# Patient Record
Sex: Female | Born: 1935 | Race: Black or African American | Hispanic: No | Smoking: Never smoker
Health system: Southern US, Community
[De-identification: ages and names within clinical notes are randomized; demographics above are authoritative.]

## PROBLEM LIST (undated history)

## (undated) DIAGNOSIS — N644 Mastodynia: Secondary | ICD-10-CM

## (undated) DIAGNOSIS — E785 Hyperlipidemia, unspecified: Secondary | ICD-10-CM

## (undated) DIAGNOSIS — M8580 Other specified disorders of bone density and structure, unspecified site: Secondary | ICD-10-CM

## (undated) DIAGNOSIS — M545 Low back pain, unspecified: Secondary | ICD-10-CM

## (undated) DIAGNOSIS — I872 Venous insufficiency (chronic) (peripheral): Secondary | ICD-10-CM

## (undated) DIAGNOSIS — F419 Anxiety disorder, unspecified: Secondary | ICD-10-CM

## (undated) DIAGNOSIS — M1990 Unspecified osteoarthritis, unspecified site: Secondary | ICD-10-CM

## (undated) DIAGNOSIS — IMO0002 Reserved for concepts with insufficient information to code with codable children: Secondary | ICD-10-CM

## (undated) DIAGNOSIS — R001 Bradycardia, unspecified: Secondary | ICD-10-CM

## (undated) DIAGNOSIS — K589 Irritable bowel syndrome without diarrhea: Secondary | ICD-10-CM

## (undated) DIAGNOSIS — I340 Nonrheumatic mitral (valve) insufficiency: Secondary | ICD-10-CM

## (undated) DIAGNOSIS — C50919 Malignant neoplasm of unspecified site of unspecified female breast: Secondary | ICD-10-CM

## (undated) DIAGNOSIS — I10 Essential (primary) hypertension: Secondary | ICD-10-CM

## (undated) DIAGNOSIS — G5600 Carpal tunnel syndrome, unspecified upper limb: Secondary | ICD-10-CM

## (undated) DIAGNOSIS — I6529 Occlusion and stenosis of unspecified carotid artery: Secondary | ICD-10-CM

## (undated) DIAGNOSIS — N816 Rectocele: Principal | ICD-10-CM

## (undated) DIAGNOSIS — R42 Dizziness and giddiness: Secondary | ICD-10-CM

## (undated) DIAGNOSIS — N63 Unspecified lump in unspecified breast: Secondary | ICD-10-CM

## (undated) HISTORY — DX: Other specified disorders of bone density and structure, unspecified site: M85.80

## (undated) HISTORY — PX: APPENDECTOMY: SHX54

## (undated) HISTORY — DX: Reserved for concepts with insufficient information to code with codable children: IMO0002

## (undated) HISTORY — DX: Venous insufficiency (chronic) (peripheral): I87.2

## (undated) HISTORY — PX: BACK SURGERY: SHX140

## (undated) HISTORY — DX: Low back pain, unspecified: M54.50

## (undated) HISTORY — DX: Malignant neoplasm of unspecified site of unspecified female breast: C50.919

## (undated) HISTORY — DX: Dizziness and giddiness: R42

## (undated) HISTORY — DX: Essential (primary) hypertension: I10

## (undated) HISTORY — PX: EYE SURGERY: SHX253

## (undated) HISTORY — DX: Carpal tunnel syndrome, unspecified upper limb: G56.00

## (undated) HISTORY — PX: CHOLECYSTECTOMY: SHX55

## (undated) HISTORY — PX: BREAST LUMPECTOMY: SHX2

## (undated) HISTORY — DX: Nonrheumatic mitral (valve) insufficiency: I34.0

## (undated) HISTORY — PX: ABDOMINAL HYSTERECTOMY: SHX81

## (undated) HISTORY — DX: Rectocele: N81.6

## (undated) HISTORY — DX: Unspecified lump in unspecified breast: N63.0

## (undated) HISTORY — DX: Unspecified osteoarthritis, unspecified site: M19.90

## (undated) HISTORY — DX: Occlusion and stenosis of unspecified carotid artery: I65.29

## (undated) HISTORY — DX: Mastodynia: N64.4

## (undated) HISTORY — DX: Anxiety disorder, unspecified: F41.9

## (undated) HISTORY — DX: Irritable bowel syndrome, unspecified: K58.9

## (undated) HISTORY — DX: Low back pain: M54.5

## (undated) HISTORY — DX: Bradycardia, unspecified: R00.1

## (undated) HISTORY — PX: OTHER SURGICAL HISTORY: SHX169

## (undated) HISTORY — DX: Hyperlipidemia, unspecified: E78.5

---

## 1999-05-31 ENCOUNTER — Encounter: Admission: RE | Admit: 1999-05-31 | Discharge: 1999-08-29 | Payer: Self-pay | Admitting: *Deleted

## 2000-04-04 ENCOUNTER — Encounter: Admission: RE | Admit: 2000-04-04 | Discharge: 2000-04-04 | Payer: Self-pay | Admitting: General Surgery

## 2000-11-20 ENCOUNTER — Ambulatory Visit (HOSPITAL_COMMUNITY): Admission: RE | Admit: 2000-11-20 | Discharge: 2000-11-20 | Payer: Self-pay | Admitting: Orthopedic Surgery

## 2000-11-20 ENCOUNTER — Encounter: Payer: Self-pay | Admitting: Orthopedic Surgery

## 2001-03-11 ENCOUNTER — Other Ambulatory Visit: Admission: RE | Admit: 2001-03-11 | Discharge: 2001-03-11 | Payer: Self-pay | Admitting: Family Medicine

## 2001-03-29 ENCOUNTER — Ambulatory Visit (HOSPITAL_COMMUNITY): Admission: RE | Admit: 2001-03-29 | Discharge: 2001-03-29 | Payer: Self-pay | Admitting: Family Medicine

## 2001-03-29 ENCOUNTER — Encounter: Payer: Self-pay | Admitting: Family Medicine

## 2001-05-24 ENCOUNTER — Encounter: Admission: RE | Admit: 2001-05-24 | Discharge: 2001-05-24 | Payer: Self-pay | Admitting: Oncology

## 2002-04-10 ENCOUNTER — Ambulatory Visit (HOSPITAL_COMMUNITY): Admission: RE | Admit: 2002-04-10 | Discharge: 2002-04-10 | Payer: Self-pay | Admitting: Family Medicine

## 2002-04-10 ENCOUNTER — Encounter: Payer: Self-pay | Admitting: Family Medicine

## 2002-05-12 ENCOUNTER — Encounter (HOSPITAL_COMMUNITY): Admission: RE | Admit: 2002-05-12 | Discharge: 2002-06-11 | Payer: Self-pay | Admitting: Oncology

## 2002-05-12 ENCOUNTER — Encounter: Admission: RE | Admit: 2002-05-12 | Discharge: 2002-05-12 | Payer: Self-pay | Admitting: Oncology

## 2003-04-15 ENCOUNTER — Ambulatory Visit (HOSPITAL_COMMUNITY): Admission: RE | Admit: 2003-04-15 | Discharge: 2003-04-15 | Payer: Self-pay | Admitting: Family Medicine

## 2003-05-13 ENCOUNTER — Encounter (HOSPITAL_COMMUNITY): Admission: RE | Admit: 2003-05-13 | Discharge: 2003-06-12 | Payer: Self-pay | Admitting: Oncology

## 2003-05-13 ENCOUNTER — Encounter: Admission: RE | Admit: 2003-05-13 | Discharge: 2003-05-13 | Payer: Self-pay | Admitting: Oncology

## 2003-08-09 ENCOUNTER — Emergency Department (HOSPITAL_COMMUNITY): Admission: EM | Admit: 2003-08-09 | Discharge: 2003-08-09 | Payer: Self-pay | Admitting: Emergency Medicine

## 2003-11-11 ENCOUNTER — Ambulatory Visit (HOSPITAL_COMMUNITY): Admission: RE | Admit: 2003-11-11 | Discharge: 2003-11-11 | Payer: Self-pay | Admitting: Internal Medicine

## 2004-04-19 ENCOUNTER — Inpatient Hospital Stay (HOSPITAL_COMMUNITY): Admission: EM | Admit: 2004-04-19 | Discharge: 2004-04-30 | Payer: Self-pay | Admitting: Emergency Medicine

## 2004-05-16 ENCOUNTER — Ambulatory Visit (HOSPITAL_COMMUNITY): Payer: Self-pay | Admitting: Oncology

## 2004-05-16 ENCOUNTER — Encounter (HOSPITAL_COMMUNITY): Admission: RE | Admit: 2004-05-16 | Discharge: 2004-06-10 | Payer: Self-pay | Admitting: Oncology

## 2004-05-16 ENCOUNTER — Encounter: Admission: RE | Admit: 2004-05-16 | Discharge: 2004-06-10 | Payer: Self-pay | Admitting: Oncology

## 2005-05-15 ENCOUNTER — Ambulatory Visit (HOSPITAL_COMMUNITY): Payer: Self-pay | Admitting: Oncology

## 2005-05-15 ENCOUNTER — Encounter (HOSPITAL_COMMUNITY): Admission: RE | Admit: 2005-05-15 | Discharge: 2005-05-15 | Payer: Self-pay | Admitting: Oncology

## 2005-05-15 ENCOUNTER — Encounter: Admission: RE | Admit: 2005-05-15 | Discharge: 2005-05-15 | Payer: Self-pay | Admitting: Oncology

## 2005-05-31 ENCOUNTER — Ambulatory Visit (HOSPITAL_COMMUNITY): Admission: RE | Admit: 2005-05-31 | Discharge: 2005-05-31 | Payer: Self-pay | Admitting: Family Medicine

## 2005-10-10 ENCOUNTER — Encounter (INDEPENDENT_AMBULATORY_CARE_PROVIDER_SITE_OTHER): Payer: Self-pay | Admitting: Family Medicine

## 2005-10-10 LAB — CONVERTED CEMR LAB: Blood Glucose, Fasting: 115 mg/dL

## 2006-02-05 ENCOUNTER — Ambulatory Visit: Payer: Self-pay | Admitting: Family Medicine

## 2006-03-05 ENCOUNTER — Ambulatory Visit: Payer: Self-pay | Admitting: Family Medicine

## 2006-03-05 LAB — CONVERTED CEMR LAB
RBC count: 4.7 10*6/uL
TSH: 0.731 microintl units/mL
WBC, blood: 8.1 10*3/uL

## 2006-03-06 ENCOUNTER — Encounter (INDEPENDENT_AMBULATORY_CARE_PROVIDER_SITE_OTHER): Payer: Self-pay | Admitting: Family Medicine

## 2006-04-02 ENCOUNTER — Ambulatory Visit: Payer: Self-pay | Admitting: Family Medicine

## 2006-05-14 ENCOUNTER — Encounter (HOSPITAL_COMMUNITY): Admission: RE | Admit: 2006-05-14 | Discharge: 2006-06-11 | Payer: Self-pay | Admitting: Oncology

## 2006-05-14 ENCOUNTER — Ambulatory Visit (HOSPITAL_COMMUNITY): Payer: Self-pay | Admitting: Oncology

## 2006-05-21 ENCOUNTER — Ambulatory Visit: Payer: Self-pay | Admitting: Family Medicine

## 2006-05-22 ENCOUNTER — Ambulatory Visit (HOSPITAL_COMMUNITY): Admission: RE | Admit: 2006-05-22 | Discharge: 2006-05-22 | Payer: Self-pay | Admitting: Family Medicine

## 2006-05-23 ENCOUNTER — Encounter: Payer: Self-pay | Admitting: Family Medicine

## 2006-05-23 ENCOUNTER — Ambulatory Visit (HOSPITAL_COMMUNITY): Admission: RE | Admit: 2006-05-23 | Discharge: 2006-05-23 | Payer: Self-pay | Admitting: Family Medicine

## 2006-05-23 DIAGNOSIS — G5600 Carpal tunnel syndrome, unspecified upper limb: Secondary | ICD-10-CM

## 2006-05-23 DIAGNOSIS — H409 Unspecified glaucoma: Secondary | ICD-10-CM

## 2006-05-23 DIAGNOSIS — M545 Low back pain: Secondary | ICD-10-CM

## 2006-05-23 DIAGNOSIS — H269 Unspecified cataract: Secondary | ICD-10-CM

## 2006-05-23 DIAGNOSIS — K5900 Constipation, unspecified: Secondary | ICD-10-CM | POA: Insufficient documentation

## 2006-05-23 DIAGNOSIS — E782 Mixed hyperlipidemia: Secondary | ICD-10-CM | POA: Insufficient documentation

## 2006-05-23 DIAGNOSIS — I10 Essential (primary) hypertension: Secondary | ICD-10-CM | POA: Insufficient documentation

## 2006-05-23 DIAGNOSIS — K589 Irritable bowel syndrome without diarrhea: Secondary | ICD-10-CM

## 2006-05-23 DIAGNOSIS — F411 Generalized anxiety disorder: Secondary | ICD-10-CM | POA: Insufficient documentation

## 2006-05-23 DIAGNOSIS — Z853 Personal history of malignant neoplasm of breast: Secondary | ICD-10-CM

## 2006-05-23 DIAGNOSIS — M1990 Unspecified osteoarthritis, unspecified site: Secondary | ICD-10-CM | POA: Insufficient documentation

## 2006-05-23 DIAGNOSIS — E785 Hyperlipidemia, unspecified: Secondary | ICD-10-CM | POA: Insufficient documentation

## 2006-06-27 ENCOUNTER — Ambulatory Visit: Payer: Self-pay | Admitting: Family Medicine

## 2006-08-08 ENCOUNTER — Ambulatory Visit: Payer: Self-pay | Admitting: Family Medicine

## 2006-08-08 DIAGNOSIS — L821 Other seborrheic keratosis: Secondary | ICD-10-CM

## 2006-08-08 LAB — CONVERTED CEMR LAB: HDL goal, serum: 40 mg/dL

## 2006-08-20 ENCOUNTER — Encounter (INDEPENDENT_AMBULATORY_CARE_PROVIDER_SITE_OTHER): Payer: Self-pay | Admitting: Family Medicine

## 2006-09-05 ENCOUNTER — Ambulatory Visit (HOSPITAL_COMMUNITY): Admission: RE | Admit: 2006-09-05 | Discharge: 2006-09-05 | Payer: Self-pay | Admitting: Family Medicine

## 2006-09-05 ENCOUNTER — Encounter (INDEPENDENT_AMBULATORY_CARE_PROVIDER_SITE_OTHER): Payer: Self-pay | Admitting: Family Medicine

## 2006-09-12 ENCOUNTER — Ambulatory Visit (HOSPITAL_COMMUNITY): Admission: RE | Admit: 2006-09-12 | Discharge: 2006-09-12 | Payer: Self-pay | Admitting: Surgery

## 2006-09-19 ENCOUNTER — Encounter (INDEPENDENT_AMBULATORY_CARE_PROVIDER_SITE_OTHER): Payer: Self-pay | Admitting: Family Medicine

## 2006-09-28 ENCOUNTER — Telehealth (INDEPENDENT_AMBULATORY_CARE_PROVIDER_SITE_OTHER): Payer: Self-pay | Admitting: Family Medicine

## 2006-10-01 ENCOUNTER — Encounter (INDEPENDENT_AMBULATORY_CARE_PROVIDER_SITE_OTHER): Payer: Self-pay | Admitting: Specialist

## 2006-10-01 ENCOUNTER — Encounter (INDEPENDENT_AMBULATORY_CARE_PROVIDER_SITE_OTHER): Payer: Self-pay | Admitting: Family Medicine

## 2006-10-01 ENCOUNTER — Ambulatory Visit (HOSPITAL_COMMUNITY): Admission: RE | Admit: 2006-10-01 | Discharge: 2006-10-01 | Payer: Self-pay | Admitting: General Surgery

## 2006-10-10 ENCOUNTER — Ambulatory Visit: Payer: Self-pay | Admitting: Family Medicine

## 2006-10-10 DIAGNOSIS — J301 Allergic rhinitis due to pollen: Secondary | ICD-10-CM | POA: Insufficient documentation

## 2006-10-17 ENCOUNTER — Encounter (INDEPENDENT_AMBULATORY_CARE_PROVIDER_SITE_OTHER): Payer: Self-pay | Admitting: Family Medicine

## 2006-10-23 ENCOUNTER — Encounter (INDEPENDENT_AMBULATORY_CARE_PROVIDER_SITE_OTHER): Payer: Self-pay | Admitting: Family Medicine

## 2006-10-24 LAB — CONVERTED CEMR LAB
ALT: 12 units/L (ref 0–35)
Albumin: 4.6 g/dL (ref 3.5–5.2)
CO2: 25 meq/L (ref 19–32)
Calcium: 10 mg/dL (ref 8.4–10.5)
Chloride: 103 meq/L (ref 96–112)
Cholesterol: 179 mg/dL (ref 0–200)
Glucose, Bld: 95 mg/dL (ref 70–99)
Potassium: 3.9 meq/L (ref 3.5–5.3)
Sodium: 140 meq/L (ref 135–145)
Total Bilirubin: 0.5 mg/dL (ref 0.3–1.2)
Total Protein: 7.6 g/dL (ref 6.0–8.3)
VLDL: 53 mg/dL — ABNORMAL HIGH (ref 0–40)

## 2006-10-29 ENCOUNTER — Encounter (INDEPENDENT_AMBULATORY_CARE_PROVIDER_SITE_OTHER): Payer: Self-pay | Admitting: Family Medicine

## 2006-11-01 ENCOUNTER — Encounter (INDEPENDENT_AMBULATORY_CARE_PROVIDER_SITE_OTHER): Payer: Self-pay | Admitting: Family Medicine

## 2006-11-06 ENCOUNTER — Ambulatory Visit: Payer: Self-pay | Admitting: Family Medicine

## 2006-11-20 ENCOUNTER — Telehealth (INDEPENDENT_AMBULATORY_CARE_PROVIDER_SITE_OTHER): Payer: Self-pay | Admitting: Family Medicine

## 2006-11-21 ENCOUNTER — Ambulatory Visit (HOSPITAL_COMMUNITY): Admission: RE | Admit: 2006-11-21 | Discharge: 2006-11-21 | Payer: Self-pay | Admitting: Family Medicine

## 2006-11-21 ENCOUNTER — Encounter (INDEPENDENT_AMBULATORY_CARE_PROVIDER_SITE_OTHER): Payer: Self-pay | Admitting: Family Medicine

## 2006-11-21 ENCOUNTER — Telehealth (INDEPENDENT_AMBULATORY_CARE_PROVIDER_SITE_OTHER): Payer: Self-pay | Admitting: *Deleted

## 2006-11-22 ENCOUNTER — Telehealth (INDEPENDENT_AMBULATORY_CARE_PROVIDER_SITE_OTHER): Payer: Self-pay | Admitting: *Deleted

## 2006-11-28 ENCOUNTER — Telehealth (INDEPENDENT_AMBULATORY_CARE_PROVIDER_SITE_OTHER): Payer: Self-pay | Admitting: Family Medicine

## 2006-12-03 ENCOUNTER — Telehealth (INDEPENDENT_AMBULATORY_CARE_PROVIDER_SITE_OTHER): Payer: Self-pay | Admitting: Family Medicine

## 2006-12-17 ENCOUNTER — Encounter (INDEPENDENT_AMBULATORY_CARE_PROVIDER_SITE_OTHER): Payer: Self-pay | Admitting: Family Medicine

## 2007-01-10 ENCOUNTER — Ambulatory Visit: Payer: Self-pay | Admitting: Family Medicine

## 2007-01-10 DIAGNOSIS — I872 Venous insufficiency (chronic) (peripheral): Secondary | ICD-10-CM | POA: Insufficient documentation

## 2007-01-11 ENCOUNTER — Telehealth (INDEPENDENT_AMBULATORY_CARE_PROVIDER_SITE_OTHER): Payer: Self-pay | Admitting: *Deleted

## 2007-02-14 ENCOUNTER — Encounter (INDEPENDENT_AMBULATORY_CARE_PROVIDER_SITE_OTHER): Payer: Self-pay | Admitting: Family Medicine

## 2007-02-22 ENCOUNTER — Ambulatory Visit: Payer: Self-pay | Admitting: Family Medicine

## 2007-03-01 ENCOUNTER — Telehealth (INDEPENDENT_AMBULATORY_CARE_PROVIDER_SITE_OTHER): Payer: Self-pay | Admitting: *Deleted

## 2007-03-18 ENCOUNTER — Telehealth (INDEPENDENT_AMBULATORY_CARE_PROVIDER_SITE_OTHER): Payer: Self-pay | Admitting: *Deleted

## 2007-03-18 ENCOUNTER — Ambulatory Visit: Payer: Self-pay | Admitting: Family Medicine

## 2007-04-26 ENCOUNTER — Encounter (INDEPENDENT_AMBULATORY_CARE_PROVIDER_SITE_OTHER): Payer: Self-pay | Admitting: Family Medicine

## 2007-05-13 ENCOUNTER — Telehealth (INDEPENDENT_AMBULATORY_CARE_PROVIDER_SITE_OTHER): Payer: Self-pay | Admitting: Family Medicine

## 2007-05-13 ENCOUNTER — Ambulatory Visit (HOSPITAL_COMMUNITY): Payer: Self-pay | Admitting: Oncology

## 2007-05-13 ENCOUNTER — Encounter (INDEPENDENT_AMBULATORY_CARE_PROVIDER_SITE_OTHER): Payer: Self-pay | Admitting: Family Medicine

## 2007-05-13 ENCOUNTER — Encounter (HOSPITAL_COMMUNITY): Admission: RE | Admit: 2007-05-13 | Discharge: 2007-06-12 | Payer: Self-pay | Admitting: Oncology

## 2007-05-24 ENCOUNTER — Ambulatory Visit: Payer: Self-pay | Admitting: Family Medicine

## 2007-05-27 ENCOUNTER — Telehealth (INDEPENDENT_AMBULATORY_CARE_PROVIDER_SITE_OTHER): Payer: Self-pay | Admitting: Family Medicine

## 2007-05-27 ENCOUNTER — Encounter (INDEPENDENT_AMBULATORY_CARE_PROVIDER_SITE_OTHER): Payer: Self-pay | Admitting: Family Medicine

## 2007-05-28 ENCOUNTER — Telehealth (INDEPENDENT_AMBULATORY_CARE_PROVIDER_SITE_OTHER): Payer: Self-pay | Admitting: *Deleted

## 2007-05-28 LAB — CONVERTED CEMR LAB
Albumin: 4.8 g/dL (ref 3.5–5.2)
BUN: 21 mg/dL (ref 6–23)
Calcium: 10.3 mg/dL (ref 8.4–10.5)
Chloride: 104 meq/L (ref 96–112)
Eosinophils Absolute: 0.2 10*3/uL (ref 0.2–0.7)
Glucose, Bld: 93 mg/dL (ref 70–99)
HDL: 52 mg/dL (ref >39)
Hemoglobin: 12.1 g/dL (ref 12.0–15.0)
Lymphs Abs: 2.4 10*3/uL (ref 0.7–4.0)
MCV: 88.6 fL (ref 78.0–100.0)
Monocytes Absolute: 0.6 10*3/uL (ref 0.1–1.0)
Monocytes Relative: 10 % (ref 3–12)
Neutro Abs: 3.3 10*3/uL (ref 1.7–7.7)
Neutrophils Relative %: 50 % (ref 43–77)
Potassium: 4 meq/L (ref 3.5–5.3)
RBC: 4.31 M/uL (ref 3.87–5.11)
Triglycerides: 214 mg/dL — ABNORMAL HIGH (ref <150)
WBC: 6.5 10*3/uL (ref 4.0–10.5)

## 2007-06-10 ENCOUNTER — Telehealth (INDEPENDENT_AMBULATORY_CARE_PROVIDER_SITE_OTHER): Payer: Self-pay | Admitting: Family Medicine

## 2007-06-17 ENCOUNTER — Encounter (INDEPENDENT_AMBULATORY_CARE_PROVIDER_SITE_OTHER): Payer: Self-pay | Admitting: Family Medicine

## 2007-07-09 ENCOUNTER — Ambulatory Visit (HOSPITAL_COMMUNITY): Admission: RE | Admit: 2007-07-09 | Discharge: 2007-07-09 | Payer: Self-pay | Admitting: Ophthalmology

## 2007-08-02 ENCOUNTER — Ambulatory Visit: Payer: Self-pay | Admitting: Family Medicine

## 2007-08-05 ENCOUNTER — Telehealth (INDEPENDENT_AMBULATORY_CARE_PROVIDER_SITE_OTHER): Payer: Self-pay | Admitting: Family Medicine

## 2007-08-16 ENCOUNTER — Ambulatory Visit: Payer: Self-pay | Admitting: Family Medicine

## 2007-08-26 ENCOUNTER — Encounter (INDEPENDENT_AMBULATORY_CARE_PROVIDER_SITE_OTHER): Payer: Self-pay | Admitting: Family Medicine

## 2007-09-20 ENCOUNTER — Telehealth (INDEPENDENT_AMBULATORY_CARE_PROVIDER_SITE_OTHER): Payer: Self-pay | Admitting: Internal Medicine

## 2007-09-27 ENCOUNTER — Encounter (INDEPENDENT_AMBULATORY_CARE_PROVIDER_SITE_OTHER): Payer: Self-pay | Admitting: Family Medicine

## 2007-10-07 ENCOUNTER — Telehealth (INDEPENDENT_AMBULATORY_CARE_PROVIDER_SITE_OTHER): Payer: Self-pay | Admitting: *Deleted

## 2007-10-14 ENCOUNTER — Encounter (INDEPENDENT_AMBULATORY_CARE_PROVIDER_SITE_OTHER): Payer: Self-pay | Admitting: Family Medicine

## 2007-10-14 ENCOUNTER — Ambulatory Visit (HOSPITAL_COMMUNITY): Admission: RE | Admit: 2007-10-14 | Discharge: 2007-10-14 | Payer: Self-pay | Admitting: Ophthalmology

## 2007-10-28 ENCOUNTER — Encounter (INDEPENDENT_AMBULATORY_CARE_PROVIDER_SITE_OTHER): Payer: Self-pay | Admitting: Family Medicine

## 2007-10-29 ENCOUNTER — Telehealth (INDEPENDENT_AMBULATORY_CARE_PROVIDER_SITE_OTHER): Payer: Self-pay | Admitting: *Deleted

## 2007-12-16 ENCOUNTER — Ambulatory Visit: Payer: Self-pay | Admitting: Family Medicine

## 2007-12-25 ENCOUNTER — Encounter (INDEPENDENT_AMBULATORY_CARE_PROVIDER_SITE_OTHER): Payer: Self-pay | Admitting: Family Medicine

## 2007-12-25 LAB — CONVERTED CEMR LAB
ALT: 15 units/L (ref 0–35)
AST: 17 units/L (ref 0–37)
Albumin: 4.5 g/dL (ref 3.5–5.2)
Alkaline Phosphatase: 96 units/L (ref 39–117)
BUN: 25 mg/dL — ABNORMAL HIGH (ref 6–23)
CO2: 23 meq/L (ref 19–32)
Calcium: 10.3 mg/dL (ref 8.4–10.5)
Chloride: 105 meq/L (ref 96–112)
Cholesterol: 170 mg/dL (ref 0–200)
Creatinine, Ser: 1.08 mg/dL (ref 0.40–1.20)
Glucose, Bld: 98 mg/dL (ref 70–99)
HDL: 50 mg/dL (ref >39)
LDL Cholesterol: 79 mg/dL (ref 0–99)
Potassium: 4.1 meq/L (ref 3.5–5.3)
Sodium: 144 meq/L (ref 135–145)
Total Bilirubin: 0.5 mg/dL (ref 0.3–1.2)
Total CHOL/HDL Ratio: 3.4
Total Protein: 7.7 g/dL (ref 6.0–8.3)
Triglycerides: 205 mg/dL — ABNORMAL HIGH (ref <150)
VLDL: 41 mg/dL — ABNORMAL HIGH (ref 0–40)

## 2007-12-30 ENCOUNTER — Other Ambulatory Visit: Admission: RE | Admit: 2007-12-30 | Discharge: 2007-12-30 | Payer: Self-pay | Admitting: Family Medicine

## 2007-12-30 ENCOUNTER — Ambulatory Visit: Payer: Self-pay | Admitting: Family Medicine

## 2007-12-30 ENCOUNTER — Encounter (INDEPENDENT_AMBULATORY_CARE_PROVIDER_SITE_OTHER): Payer: Self-pay | Admitting: Family Medicine

## 2008-01-22 ENCOUNTER — Encounter (INDEPENDENT_AMBULATORY_CARE_PROVIDER_SITE_OTHER): Payer: Self-pay | Admitting: Family Medicine

## 2008-01-27 ENCOUNTER — Ambulatory Visit: Payer: Self-pay | Admitting: Internal Medicine

## 2008-01-27 DIAGNOSIS — I498 Other specified cardiac arrhythmias: Secondary | ICD-10-CM

## 2008-01-27 LAB — CONVERTED CEMR LAB: Hemoglobin: 12.7 g/dL

## 2008-01-28 ENCOUNTER — Ambulatory Visit: Payer: Self-pay | Admitting: Cardiology

## 2008-01-30 ENCOUNTER — Ambulatory Visit (HOSPITAL_COMMUNITY): Admission: RE | Admit: 2008-01-30 | Discharge: 2008-01-30 | Payer: Self-pay | Admitting: Cardiology

## 2008-02-18 ENCOUNTER — Ambulatory Visit: Payer: Self-pay | Admitting: Cardiology

## 2008-03-17 ENCOUNTER — Ambulatory Visit: Payer: Self-pay | Admitting: Family Medicine

## 2008-03-25 ENCOUNTER — Telehealth (INDEPENDENT_AMBULATORY_CARE_PROVIDER_SITE_OTHER): Payer: Self-pay | Admitting: Family Medicine

## 2008-03-25 ENCOUNTER — Encounter (INDEPENDENT_AMBULATORY_CARE_PROVIDER_SITE_OTHER): Payer: Self-pay | Admitting: Family Medicine

## 2008-03-31 ENCOUNTER — Ambulatory Visit: Payer: Self-pay | Admitting: Family Medicine

## 2008-05-12 ENCOUNTER — Encounter (INDEPENDENT_AMBULATORY_CARE_PROVIDER_SITE_OTHER): Payer: Self-pay | Admitting: Family Medicine

## 2008-05-12 ENCOUNTER — Ambulatory Visit (HOSPITAL_COMMUNITY): Payer: Self-pay | Admitting: Oncology

## 2008-05-29 ENCOUNTER — Ambulatory Visit (HOSPITAL_COMMUNITY): Admission: RE | Admit: 2008-05-29 | Discharge: 2008-05-29 | Payer: Self-pay | Admitting: Family Medicine

## 2008-06-01 ENCOUNTER — Ambulatory Visit: Payer: Self-pay | Admitting: Family Medicine

## 2008-06-01 DIAGNOSIS — M899 Disorder of bone, unspecified: Secondary | ICD-10-CM | POA: Insufficient documentation

## 2008-06-01 DIAGNOSIS — M949 Disorder of cartilage, unspecified: Secondary | ICD-10-CM

## 2008-06-08 ENCOUNTER — Encounter (INDEPENDENT_AMBULATORY_CARE_PROVIDER_SITE_OTHER): Payer: Self-pay | Admitting: Family Medicine

## 2008-06-10 LAB — CONVERTED CEMR LAB
OCCULT 2: NEGATIVE
OCCULT 3: NEGATIVE

## 2008-06-15 ENCOUNTER — Encounter (INDEPENDENT_AMBULATORY_CARE_PROVIDER_SITE_OTHER): Payer: Self-pay | Admitting: Family Medicine

## 2008-06-15 ENCOUNTER — Ambulatory Visit (HOSPITAL_COMMUNITY): Admission: RE | Admit: 2008-06-15 | Discharge: 2008-06-15 | Payer: Self-pay | Admitting: Family Medicine

## 2008-07-08 ENCOUNTER — Encounter (INDEPENDENT_AMBULATORY_CARE_PROVIDER_SITE_OTHER): Payer: Self-pay | Admitting: Family Medicine

## 2008-07-09 LAB — CONVERTED CEMR LAB
ALT: 19 units/L (ref 0–35)
AST: 19 units/L (ref 0–37)
Calcium: 10.1 mg/dL (ref 8.4–10.5)
Chloride: 105 meq/L (ref 96–112)
Creatinine, Ser: 1.09 mg/dL (ref 0.40–1.20)
Potassium: 4 meq/L (ref 3.5–5.3)
Total CHOL/HDL Ratio: 3.1
VLDL: 44 mg/dL — ABNORMAL HIGH (ref 0–40)

## 2008-07-23 ENCOUNTER — Ambulatory Visit: Payer: Self-pay | Admitting: Family Medicine

## 2008-08-13 ENCOUNTER — Ambulatory Visit: Payer: Self-pay | Admitting: Family Medicine

## 2008-08-13 LAB — CONVERTED CEMR LAB
Bilirubin Urine: NEGATIVE
Glucose, Urine, Semiquant: NEGATIVE
Protein, U semiquant: 30
Specific Gravity, Urine: 1.025
pH: 6.5

## 2008-08-14 ENCOUNTER — Encounter (INDEPENDENT_AMBULATORY_CARE_PROVIDER_SITE_OTHER): Payer: Self-pay | Admitting: Family Medicine

## 2008-08-14 LAB — CONVERTED CEMR LAB

## 2008-08-15 ENCOUNTER — Encounter (INDEPENDENT_AMBULATORY_CARE_PROVIDER_SITE_OTHER): Payer: Self-pay | Admitting: Family Medicine

## 2008-09-03 ENCOUNTER — Ambulatory Visit: Payer: Self-pay | Admitting: Family Medicine

## 2008-09-03 LAB — CONVERTED CEMR LAB
Blood in Urine, dipstick: NEGATIVE
Nitrite: NEGATIVE
Protein, U semiquant: NEGATIVE
Urobilinogen, UA: 0.2

## 2008-09-17 ENCOUNTER — Ambulatory Visit: Payer: Self-pay | Admitting: Family Medicine

## 2008-09-23 ENCOUNTER — Ambulatory Visit (HOSPITAL_COMMUNITY): Admission: RE | Admit: 2008-09-23 | Discharge: 2008-09-23 | Payer: Self-pay | Admitting: Family Medicine

## 2008-09-28 ENCOUNTER — Telehealth (INDEPENDENT_AMBULATORY_CARE_PROVIDER_SITE_OTHER): Payer: Self-pay | Admitting: *Deleted

## 2008-10-22 ENCOUNTER — Ambulatory Visit: Payer: Self-pay | Admitting: Family Medicine

## 2008-12-23 ENCOUNTER — Ambulatory Visit: Payer: Self-pay | Admitting: Family Medicine

## 2008-12-23 DIAGNOSIS — G4700 Insomnia, unspecified: Secondary | ICD-10-CM | POA: Insufficient documentation

## 2008-12-24 ENCOUNTER — Encounter (INDEPENDENT_AMBULATORY_CARE_PROVIDER_SITE_OTHER): Payer: Self-pay | Admitting: *Deleted

## 2008-12-24 LAB — CONVERTED CEMR LAB
AST: 20 units/L
Albumin: 4.5 g/dL
CO2: 25 meq/L
Calcium: 10.2 mg/dL
Glucose, Bld: 94 mg/dL
LDL Cholesterol: 64 mg/dL
Potassium: 4.2 meq/L
Sodium: 144 meq/L
Total Protein: 7.3 g/dL
Triglycerides: 127 mg/dL

## 2008-12-28 LAB — CONVERTED CEMR LAB
Albumin: 4.5 g/dL (ref 3.5–5.2)
BUN: 28 mg/dL — ABNORMAL HIGH (ref 6–23)
CO2: 25 meq/L (ref 19–32)
Calcium: 10.2 mg/dL (ref 8.4–10.5)
Chloride: 107 meq/L (ref 96–112)
Cholesterol: 147 mg/dL (ref 0–200)
Creatinine, Ser: 1.24 mg/dL — ABNORMAL HIGH (ref 0.40–1.20)
HDL: 58 mg/dL (ref >39)
Total CHOL/HDL Ratio: 2.5

## 2009-01-15 ENCOUNTER — Encounter: Payer: Self-pay | Admitting: Cardiology

## 2009-02-03 ENCOUNTER — Ambulatory Visit: Payer: Self-pay | Admitting: Family Medicine

## 2009-03-12 ENCOUNTER — Encounter (INDEPENDENT_AMBULATORY_CARE_PROVIDER_SITE_OTHER): Payer: Self-pay | Admitting: Family Medicine

## 2009-05-12 ENCOUNTER — Ambulatory Visit (HOSPITAL_COMMUNITY): Payer: Self-pay | Admitting: Oncology

## 2009-05-31 ENCOUNTER — Ambulatory Visit (HOSPITAL_COMMUNITY): Admission: RE | Admit: 2009-05-31 | Discharge: 2009-05-31 | Payer: Self-pay | Admitting: Internal Medicine

## 2009-09-29 ENCOUNTER — Emergency Department (HOSPITAL_COMMUNITY): Admission: EM | Admit: 2009-09-29 | Discharge: 2009-09-30 | Payer: Self-pay | Admitting: Emergency Medicine

## 2009-10-14 ENCOUNTER — Encounter (INDEPENDENT_AMBULATORY_CARE_PROVIDER_SITE_OTHER): Payer: Self-pay | Admitting: *Deleted

## 2009-12-20 ENCOUNTER — Emergency Department (HOSPITAL_COMMUNITY): Admission: EM | Admit: 2009-12-20 | Discharge: 2009-12-20 | Payer: Self-pay | Admitting: Emergency Medicine

## 2010-01-11 ENCOUNTER — Encounter (INDEPENDENT_AMBULATORY_CARE_PROVIDER_SITE_OTHER): Payer: Self-pay | Admitting: *Deleted

## 2010-01-14 ENCOUNTER — Ambulatory Visit: Payer: Self-pay | Admitting: Cardiology

## 2010-01-14 DIAGNOSIS — B029 Zoster without complications: Secondary | ICD-10-CM | POA: Insufficient documentation

## 2010-01-14 DIAGNOSIS — I679 Cerebrovascular disease, unspecified: Secondary | ICD-10-CM

## 2010-01-14 DIAGNOSIS — I080 Rheumatic disorders of both mitral and aortic valves: Secondary | ICD-10-CM

## 2010-01-17 ENCOUNTER — Encounter (INDEPENDENT_AMBULATORY_CARE_PROVIDER_SITE_OTHER): Payer: Self-pay | Admitting: *Deleted

## 2010-01-17 LAB — CONVERTED CEMR LAB
Cholesterol: 192 mg/dL
HDL: 47 mg/dL
HDL: 47 mg/dL (ref >39)
LDL Cholesterol: 114 mg/dL
LDL Cholesterol: 114 mg/dL — ABNORMAL HIGH (ref 0–99)

## 2010-01-18 ENCOUNTER — Encounter (INDEPENDENT_AMBULATORY_CARE_PROVIDER_SITE_OTHER): Payer: Self-pay | Admitting: *Deleted

## 2010-01-27 ENCOUNTER — Telehealth (INDEPENDENT_AMBULATORY_CARE_PROVIDER_SITE_OTHER): Payer: Self-pay | Admitting: *Deleted

## 2010-02-22 ENCOUNTER — Ambulatory Visit (HOSPITAL_COMMUNITY)
Admission: RE | Admit: 2010-02-22 | Discharge: 2010-02-22 | Payer: Self-pay | Admitting: Physical Medicine and Rehabilitation

## 2010-03-01 ENCOUNTER — Encounter: Payer: Self-pay | Admitting: Cardiology

## 2010-03-16 LAB — CONVERTED CEMR LAB
ALT: 15 units/L (ref 0–35)
AST: 18 units/L (ref 0–37)
Alkaline Phosphatase: 60 units/L (ref 39–117)
CO2: 25 meq/L (ref 19–32)
Creatinine, Ser: 1.33 mg/dL — ABNORMAL HIGH (ref 0.40–1.20)
Sodium: 143 meq/L (ref 135–145)
Total Bilirubin: 0.5 mg/dL (ref 0.3–1.2)
Total Protein: 7.2 g/dL (ref 6.0–8.3)

## 2010-05-11 ENCOUNTER — Encounter (HOSPITAL_COMMUNITY)
Admission: RE | Admit: 2010-05-11 | Discharge: 2010-06-10 | Payer: Self-pay | Source: Home / Self Care | Attending: Oncology | Admitting: Oncology

## 2010-05-11 ENCOUNTER — Ambulatory Visit (HOSPITAL_COMMUNITY): Payer: Self-pay | Admitting: Oncology

## 2010-06-02 ENCOUNTER — Ambulatory Visit (HOSPITAL_COMMUNITY)
Admission: RE | Admit: 2010-06-02 | Discharge: 2010-06-02 | Payer: Self-pay | Source: Home / Self Care | Attending: Oncology | Admitting: Oncology

## 2010-07-02 ENCOUNTER — Encounter: Payer: Self-pay | Admitting: Family Medicine

## 2010-07-12 NOTE — Assessment & Plan Note (Signed)
Summary: F1Y  Medications Added REMERON 15 MG TABS (MIRTAZAPINE) use as needed TRAVATAN Z 0.004 % SOLN (TRAVOPROST) 1 drop each eye at bed time AMLODIPINE BESYLATE 5 MG TABS (AMLODIPINE BESYLATE) take 1 tab daily BENAZEPRIL HCL 20 MG TABS (BENAZEPRIL HCL) take 1 tab daily LORTAB 5-500 MG TABS (HYDROCODONE-ACETAMINOPHEN) take as needed ALEVE 220 MG TABS (NAPROXEN SODIUM) use as needed COSOPT 22.3-6.8 MG/ML SOLN (DORZOLAMIDE HCL-TIMOLOL MAL) use 1 drop both eyes daily ALPHAGAN P 0.15 % SOLN (BRIMONIDINE TARTRATE) use 1 drop each eye daily      Allergies Added:   Visit Type:  Follow-up Primary Provider:  Dr.Fanta   History of Present Illness: [name] returns to the office in one year beyond her anticipated followup visit for continuing assessment of hypertension, mitral regurgitation, sinus bradycardia and chronic kidney disease.  Since her last visit, she has done quite well.  She remains fairly active and describes no dizziness, no syncope, no chest discomfort and no dyspnea on exertion.  Blood pressure control has been good as far she knows.  Rosuvastatin was discontinued due to very low cholesterol values by Dr. Felecia Shelling.  Current Medications (verified): 1)  Daily Multiple Vitamins  Tabs (Multiple Vitamin) .... Once Daily 2)  Oscal 500/200 D-3 500-200 Mg-Unit Tabs (Calcium-Vitamin D) .... Three Times A Day 3)  Aspir-Low 81 Mg Tbec (Aspirin) .... One By Mouth Daily 4)  Fish Oil Concentrate 1000 Mg  Caps (Omega-3 Fatty Acids) .... Two Times A Day 5)  Chlorthalidone 25 Mg Tabs (Chlorthalidone) .... 1/2 Tab Daily 6)  Trilipix 135 Mg Cpdr (Choline Fenofibrate) .... One Daily 7)  Nasonex 50 Mcg/act Susp (Mometasone Furoate) .... One Squirt Per Nostril Daily 8)  Remeron 15 Mg Tabs (Mirtazapine) .... Use As Needed 9)  Travatan Z 0.004 % Soln (Travoprost) .Marland Kitchen.. 1 Drop Each Eye At Bed Time 10)  Amlodipine Besylate 5 Mg Tabs (Amlodipine Besylate) .... Take 1 Tab Daily 11)  Benazepril Hcl  20 Mg Tabs (Benazepril Hcl) .... Take 1 Tab Daily 12)  Lortab 5-500 Mg Tabs (Hydrocodone-Acetaminophen) .... Take As Needed 13)  Aleve 220 Mg Tabs (Naproxen Sodium) .... Use As Needed 14)  Cosopt 22.3-6.8 Mg/ml Soln (Dorzolamide Hcl-Timolol Mal) .... Use 1 Drop Both Eyes Daily 15)  Alphagan P 0.15 % Soln (Brimonidine Tartrate) .... Use 1 Drop Each Eye Daily  Allergies (verified): 1)  ! Pcn  Past History:  PMH, FH, and Social History reviewed and updated.  Past Medical History: Hyperlipidemia Hypertension Mitral regurgitation; sinus bradycardia Herpes zoster BREAST PAIN, LEFT (ICD-611.71) INSUFFICIENCY, VENOUS NOS (ICD-459.81) ALLERGIC RHINITIS, SEASONAL (ICD-477.0) SEBORRHEIC KERATOSIS (ICD-702.19) BREAST MASS, LEFT -  FIBROADENOMA AND PAPILLOMA (ICD-611.72) VERTIGO (ICD-780.4) CARPAL TUNNEL SYNDROME (ICD-354.0) CATARACT NOS (ICD-366.9) GLAUCOMA NOS (ICD-365.9) CONSTIPATION (ICD-564.00) IBS (ICD-564.1) OSTEOPENIA (ICD-733.90) OSTEOARTHRITIS (ICD-715.90) LOW BACK PAIN (ICD-724.2) BREAST CANCER, HX OF (ICD-V10.3) ANXIETY (ICD-300.00)  Review of Systems       See history of present illness.  Vital Signs:  Patient profile:   77 year old female Weight:      151 pounds BMI:     26.85 Pulse rate:   65 / minute BP sitting:   140 / 60  (right arm)  Vitals Entered By: Dreama Saa, CNA (January 14, 2010 2:33 PM)  Physical Exam  General:  Mildly overweight; well developed; no acute distress:   Neck-No JVD; no carotid bruits: Lungs-No tachypnea, no rales; no rhonchi; no wheezes: Cardiovascular-normal PMI; normal S1 and S2; apical systolic murmur Abdomen-BS normal; soft and  non-tender without masses or organomegaly:  Musculoskeletal-No deformities, no cyanosis or clubbing: Neurologic-Normal cranial nerves; symmetric strength and tone:  Skin-Warm, no significant lesions: Extremities-Nl distal pulses; no edema:     Impression & Recommendations:  Problem # 1:   CEREBROVASCULAR DISEASE (ICD-437.9) Minimal plaque in 8/09 without obstruction; no further testing anticipated within the next few years.  Problem # 2:  MITRAL REGURGITATION (ICD-396.3) No symptoms to suggest the lesion is hemodynamically significant; followup testing can be deferred.  Problem # 3:  HYPERTENSION (ICD-401.9) Blood pressure control is good; current medications will be continued.  Problem # 4:  HYPERLIPIDEMIA (ICD-272.4) Patient has no known significant vascular disease.  She probably will not require additional pharmacologic therapy.  A repeat lipid profile is pending.  I will plan to reassess this nice woman in one year.  Other Orders: T-Lipid Profile [phone]) Future Orders: T-Comprehensive Metabolic Panel [phone]) ... 03/16/2010  Patient Instructions: 1)  Your physician recommends that you schedule a follow-up appointment in: 1 year 2)  Your physician recommends that you return for lab work in: Advertising account executive and in 2 months

## 2010-07-12 NOTE — Miscellaneous (Signed)
Summary: chlorthalidone refill  Clinical Lists Changes  Medications: Changed medication from CHLORTHALIDONE 25 MG TABS (CHLORTHALIDONE) 1/2 tab daily to CHLORTHALIDONE 25 MG TABS (CHLORTHALIDONE) 1/2 tab daily - Signed Rx of CHLORTHALIDONE 25 MG TABS (CHLORTHALIDONE) 1/2 tab daily;  #15 x 0;  Signed;  Entered by: Teressa Lower RN;  Authorized by: Kathlen Brunswick, MD, [facility];  Method used: Electronically to [facility]*, [address] [place], [place], [place]  [address], Ph: [phone], Fax: [phone]    Prescriptions: CHLORTHALIDONE 25 MG TABS (CHLORTHALIDONE) 1/2 tab daily  #15 x 0   Entered by:   Teressa Lower RN   Authorized by:   Kathlen Brunswick, MD, [facility]   Signed by:   Teressa Lower RN on 10/14/2009   Method used:   Electronically to        [facility]* (retail)       [address]       [place], [place]  [address]       Ph: [phone]       Fax: [phone]   RxID:   [id number]

## 2010-07-12 NOTE — Miscellaneous (Signed)
Summary: labs lipids,01/17/2010  Clinical Lists Changes  Observations: Added new observation of LDL: 114 mg/dL (16/03/9603 5:40) Added new observation of HDL: 47 mg/dL (98/04/9146 8:29) Added new observation of TRIGLYC TOT: 154 mg/dL (56/21/3086 5:78) Added new observation of CHOLESTEROL: 192 mg/dL (46/96/2952 8:41)

## 2010-07-12 NOTE — Miscellaneous (Signed)
Summary: LABS CMP,LIPIDS,12/24/2008  Clinical Lists Changes  Observations: Added new observation of CALCIUM: 10.2 mg/dL (71/69/6789 38:10) Added new observation of ALBUMIN: 4.5 g/dL (17/51/0258 52:77) Added new observation of PROTEIN, TOT: 7.3 g/dL (82/42/3536 14:43) Added new observation of SGPT (ALT): 17 units/L (12/24/2008 10:00) Added new observation of SGOT (AST): 20 units/L (12/24/2008 10:00) Added new observation of ALK PHOS: 57 units/L (12/24/2008 10:00) Added new observation of CREATININE: 1.24 mg/dL (15/40/0867 61:95) Added new observation of BUN: 28 mg/dL (09/32/6712 45:80) Added new observation of BG RANDOM: 94 mg/dL (99/83/3825 05:39) Added new observation of CO2 PLSM/SER: 25 meq/L (12/24/2008 10:00) Added new observation of CL SERUM: 107 meq/L (12/24/2008 10:00) Added new observation of K SERUM: 4.2 meq/L (12/24/2008 10:00) Added new observation of NA: 144 meq/L (12/24/2008 10:00) Added new observation of LDL: 64 mg/dL (76/73/4193 79:02) Added new observation of HDL: 58 mg/dL (40/97/3532 99:24) Added new observation of TRIGLYC TOT: 127 mg/dL (26/83/4196 22:29) Added new observation of CHOLESTEROL: 147 mg/dL (79/89/2119 41:74)
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
## 2010-07-12 NOTE — Letter (Signed)
Summary: DR DALTON-BETHEA OFFIC ENOTE 02/17/10  DR DALTON-BETHEA OFFIC ENOTE 02/17/10   Imported By: Faythe Ghee 03/01/2010 10:00:32  _____________________________________________________________________  External Attachment:    Type:   Image     Comment:   External Document

## 2010-07-12 NOTE — Letter (Signed)
Summary: [place] [facility] [employer]  [address] [place] [address]   Phone: [phone]  Fax: [phone]     January 14, 2010 MRN: [id number]   [facility] [address] [place], [place]  [address]      YOUR LAB WORK IS DUE  March 16, 2010 _________________________________________  Please go to [facility], located across the street from [facility] on the second floor.  Hours are Monday - Friday 7am until 7:30pm         Saturday 8am until 12noon    __  DO NOT EAT OR DRINK AFTER MIDNIGHT EVENING PRIOR TO LABWORK  _X_ YOUR LABWORK IS NOT FASTING --YOU MAY EAT PRIOR TO LABWORK

## 2010-07-12 NOTE — Progress Notes (Signed)
Summary: REFERAL   Phone Note Call from Patient Call back at Home Phone [phone]   Caller: PT Reason for Call: Talk to Nurse Summary of Call: PT WAS SEEN ON 01/14/10 AND DR Dietrich Pates TOLD HER ABOUT ANOTHER DOCTOR HERE IN [place] THAT SHE COULD GO TO BUT DOESNT REMEMBER THE NAME (ITS A LADY DOCTOR). THIS WOULD BE FOR THE PAIN IN HER LEGS THAT SHE THINKS IS COMING FOR HER ARTHRITIS IN HIPS. Initial call taken by: Faythe Ghee,  January 27, 2010 12:00 PM  Follow-up for Phone Call        Pt was given information on Dr. Nickola Major [phone] Follow-up by: Teressa Lower RN,  January 27, 2010 3:27 PM

## 2010-08-30 LAB — POCT CARDIAC MARKERS
CKMB, poc: <1 ng/mL — ABNORMAL LOW (ref 1.0–8.0)
Myoglobin, poc: 86.6 ng/mL (ref 12–200)
Troponin i, poc: <0.05 ng/mL (ref 0.00–0.09)

## 2010-08-30 LAB — CBC
HCT: 34.6 % — ABNORMAL LOW (ref 36.0–46.0)
Hemoglobin: 12.1 g/dL (ref 12.0–15.0)
MCHC: 34.9 g/dL (ref 30.0–36.0)
MCV: 85.8 fL (ref 78.0–100.0)
RBC: 4.03 MIL/uL (ref 3.87–5.11)
RDW: 13.8 % (ref 11.5–15.5)

## 2010-08-30 LAB — PROTIME-INR: INR: 1.01 (ref 0.00–1.49)

## 2010-08-30 LAB — BASIC METABOLIC PANEL
CO2: 26 mEq/L (ref 19–32)
Calcium: 10.3 mg/dL (ref 8.4–10.5)
Chloride: 111 mEq/L (ref 96–112)
GFR calc Af Amer: 47 mL/min — ABNORMAL LOW (ref >60)
Glucose, Bld: 117 mg/dL — ABNORMAL HIGH (ref 70–99)
Potassium: 3.5 mEq/L (ref 3.5–5.1)
Sodium: 143 mEq/L (ref 135–145)

## 2010-10-25 NOTE — Letter (Signed)
January 28, 2008    Franchot Heidelberg, MD  [address]  [place], [place]  [address]   RE:  Jodi Peters, Jodi Peters  MRN:  [id number]  /  DOB:  05-18-1930   Dear [name],   It was my pleasure evaluating Ms. Strickling in consultation today at the  kind request of your practice.  As you know, this nice lady has had a  history of vertigo that has been treated successfully with Antivert.  She recently was seen in urgent care for lightheadedness and  subsequently evaluated by Dr. Jen Mow.  She was somewhat bradycardic with  heart rates in the 50s and perhaps the upper 40s.  She has not had any  frank loss of consciousness.  She does not appear to have true vertigo,  but feels presyncopal, particularly when she arises quickly.  There has  been some intercurrent fatigue as well and malaise.  She notes that  sitting currently in the office, she feels fine.  I have laboratory  studies from your office including a normal chemistry profile and a  lipid profile showing somewhat high triglycerides, but good total  cholesterol, HDL and LDL.  The problem is that these tests are not  dated, which seems to be a deficiency of the EMR that you are currently  using.   Ms. Sylva has not had any known cardiac disease.  She was evaluated by  Dr. Domingo Sep in the past.  We have requested copies of those records.  I  have an echocardiogram from 2005 for which the indication was a murmur.  The patient was thought to have a minor LVOT gradient, perhaps with  minimal hypertrophic cardiomyopathy.   CURRENT MEDICATIONS:  Somewhat uncertain.  The patient has a list, but  is not up-to-date.  As best we can tell, she is taking Os-Cal with  vitamin D, fish oil, citalopram 20 mg daily, Benicar/HCT 40/25 mg daily,  amlodipine 10 mg daily, simvastatin 10 mg daily, aspirin 81 mg daily,  Xalatan eye drops, and clonazepam 0.25 mg b.i.d. p.r.n.   ALLERGIES:  An allergy to PENICILLIN is reported.   PAST MEDICAL HISTORY:   Otherwise notable for an excisional of breast  biopsy in 2008 and cholecystectomy in 2006.  Ms. Mathwig has hypertension  that has been well treated as well as hyperlipidemia.  She has had an  episode of herpes zoster that has resolved.  Earlier this year, she  required 3 left eye surgeries for a cataract extraction with  complications.   SOCIAL HISTORY:  Retired; divorced with 5 children.  No use of tobacco  products nor alcohol.   FAMILY HISTORY:  Sketchy; mother died due to renal failure.   REVIEW OF SYSTEMS:  Notable for the need for corrective lenses, partial  upper and lower dentures, occasional diarrhea, occasional constipation,  arthritic discomfort in the hands and shoulders, and mild edema  intermittently.  All other systems reviewed and are negative.   PHYSICAL EXAMINATION:  GENERAL:  Pleasant woman in no acute distress.  VITAL SIGNS:  The weight is 155 pounds, blood pressure 120/60, falling  to 100/50 when standing.  The heart rate 54 and regular and respirations  18.  NECK:  No jugular venous distention; left carotid bruit present.  HEENT:  Deformed left ear; bilateral arcus; normal oral mucosa.  LUNGS:  Clear.  CARDIAC:  Normal first and second heart sounds; grade 2-3/6 early  systolic ejection murmur at the cardiac base.  ABDOMEN:  Soft and  nontender; normal bowel sounds; no organomegaly.  EXTREMITIES:  One-half plus ankle edema.  Distal pulses intact.  NEUROLOGIC:  Symmetric strength and tone; normal cranial nerves.  No  nystagmus.  SKIN:  Flat hyperkeratotic lesions, most notable on her facial skin.   EKG:  Sinus bradycardia; prolonged sinus pause resulting in a junctional  escape beat at 1.9 seconds.  Otherwise unremarkable.   IMPRESSION:  Ms. Colan presents with lightheadedness, predominantly  orthostatic and sinus bradycardia.  Even at heart rates in the 40s, she  should not be symptomatic.  If she has more significant pauses, that of  course could create  symptomatic spells.  Although her standing blood  pressure is fairly good in the office, she is borderline orthostatic.  The simplest approach is to try to decrease her medication.  We will  substitute chlorthalidone 12.5 mg daily for Benicar/HCT.  She will taper  citalopram.  She will not take clonazepam or Antivert for the time  being.  Basic blood test including a TSH level are pending.  I  recommended that she wear an event recorder, but she has had a bad  experience with that device in the past and does not wish to do so at  the present time.  If she does have hypertrophic obstructive  cardiomyopathy, that could be contributing to her current problems;  however, by description her echocardiogram may be  consistent simply with hypertensive heart disease.  We will defer repeat  echo for now, obtain records from [facility] and plan to  reassess this nice woman in 2 weeks.   Thanks so much for sending her to me.    Sincerely,      Gerrit Friends. Dietrich Pates, MD, [facility]  Electronically Signed    RMR/MedQ  DD: 01/28/2008  DT: 01/29/2008  Job #: [id number]

## 2010-10-25 NOTE — Op Note (Signed)
NAME:  Jodi Peters, Jodi Peters                  ACCOUNT NO.:  [id number]   MEDICAL RECORD NO.:  [id number]          PATIENT TYPE:  AMB   LOCATION:  SDS                          FACILITY:  [facility]   PHYSICIAN:  Jillyn Hidden A. Rankin, M.D.   DATE OF BIRTH:  [birth year]   DATE OF PROCEDURE:  07/09/2007  DATE OF DISCHARGE:                               OPERATIVE REPORT   PREOPERATIVE DIAGNOSIS:  1. Dense vitreous hemorrhage, left eye.  2. 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  3. Retained lens fragments left eye status post extraction intraocular      placement planned earlier today.   POSTOPERATIVE DIAGNOSIS:  1. Dense vitreous hemorrhage, left eye.  2. 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  3. Retained lens fragments left eye status post extraction intraocular      placement planned earlier today.  4. Aphakia with some remnants of anterior capsule apparently left      behind.  5. Resection macular branch retinal vein occlusion superotemporally in      the left eye.  6. Nondiabetic proliferative retinopathy of left eye with small      neovascularizations elsewhere found in the superotemporal quadrant.   SURGEON:  Alford Highland. Rankin, M.D.   ANESTHESIA:  Local retrobulbar, monitored anesthesia control.   PROCEDURE:  1. Posterior vitrectomy with endolaser panphotocoagulation left eye -      25 gauge with anterior chamber wash out.  2. Temporary air-fluid exchange with fluid left in the eye that      remained at the end of case.   INDICATIONS FOR PROCEDURE:  The patient is a 77 year old woman who  laterally this morning to the office on emergency basis after developing  one day status post cataract surgery dense vitreous hemorrhage, the left  eye, presumably retained lens fragments left eye.  The patient was found  to have an elevated intraocular pressure high 58 was treated in the  office, temporizing measure with therapeutic aqueous paracentesis to  lower the  intraocular pressure and for  pain control.  The patient and  symptoms improved throughout the day.  She is planned today for  therapeutic vitrectomy, anterior chamber washout and delivery of a  treatment to the underlying pathology should it be disclosed.  The  patient understands the risks of anesthesia including the rare  occurrence of death but also to the eye including but not limited to  hemorrhage, infection, scarring, need for another surgery, no change in  vision, loss of vision and progression of disease despite intervention.  After appropriate signed consent was obtained, the patient taken to the  operating room.  In the operating room monitoring was followed by mild  sedation.  2% Xylocaine 5 mL injected retrobulbar followed by an  additional 5 mL laterally in fashion of modified Darel Hong.  Left  periocular region sterilely prepped, draped usual ophthalmic fashion.  Lid speculum applied.  A 25-gauge trocar was not used for the infusion  but a Lewicky anterior chamber maintainer was placed in the paracentesis  incision inferotemporally so as to clear the anterior chamber  for  visualization posteriorly.  A previously placed paracentesis incision  was found superiorly and a 25 gauge vitrectomy was placed in this  superotemporally to clear the anterior chamber view.  This was done  without difficulty.  The wounds healed and closed spontaneously.  Superior trocars two in each superior quadrants were applied.  At this  time core vitrectomy was then begun.  Dense vitreous hemorrhage  preretinal hemorrhage was identified.  This was removed without  difficulty.  Multiple cortical lens fragments were found throughout the  vitreous cavity.  These were removed.  Clot-like material was found over  the posterior pole.  This was also mobilized.   After clearance of vitreous hemorrhage and two gas fluid exchanges to  hasten clearance of vitreous hemorrhage under fluid endolaser  photocoagulation placed with concentration  in superotemporal quadrant to  an area of apparent avascularity and adjacent neovascularization  elsewhere.  The remainder of the laser photocoagulation placed 360 to  straddle the equator.   Over 1600 applications were applied.  At this time instruments were  removed from the eye.  Superior trocars removed.  The anterior chamber  maintainer infusion was removed.  Subconjunctival Decadron applied.  The  wounds were secure.  A sterile patch and Fox shield were applied.  Patient tolerated procedure without complication.      Alford Highland Rankin, M.D.  Electronically Signed     GAR/MEDQ  D:  07/09/2007  T:  07/10/2007  Job:  [id number]   cc:   Molly Maduro L. Dione Booze, M.D.

## 2010-10-25 NOTE — Letter (Signed)
February 18, 2008    Jodi Heidelberg, MD  [address], [address]  [place], [place] [address]   RE:  Jodi, Peters  MRN:  [id number]  /  DOB:  February 16, 1930   Dear Jodi Peters:   Jodi Peters returns to the office for continued assessment and treatment  of orthostatic lightheadedness, hypertension, and various other  cardiovascular issues.  Since her last visit, symptoms have resolved.  She feels fine at present without any more shakiness or lightheadedness.  She has monitored blood pressures at home extremely diligently.  A 90%  are well within the acceptable range.  A few systolics are above 145 or  even up to 160.   Records from [facility] were obtained.  She was  principally evaluated for chest discomfort at that time with a negative  stress nuclear study.  She also had an echocardiogram that showed mild  pulmonary hypertension, mild structural abnormalities of the mitral  valve with mild regurgitation.   Medications are unchanged from her last visit except for the  substitution of chlorthalidone 12.5 mg daily for Benicar/HCT.   PHYSICAL EXAMINATION:  GENERAL:  Pleasant woman in no acute distress.  VITAL SIGNS:  The weight is 153, 2 pounds less than at her last visit.  Blood pressure 120/60, heart rate 60 and regular, and respirations 14.  NECK:  No jugular venous distention; normal carotid upstrokes with a low  pitched bruit on the left.  CARDIAC:  Normal first and second heart sounds; grade 1/6 nearly  holosystolic murmur at the left sternal border and apex.  LUNGS:  Clear.  ABDOMEN:  Soft and nontender; no organomegaly.  EXTREMITIES:  No edema.   Carotid ultrasound showed some tortuosity of the left carotid with  minimal atherosclerotic plaque.   Metabolic profile showed very mild chronic kidney disease with a  creatinine of approximately 1.3.  TSH was normal.  BNP was also normal.   IMPRESSION:  Jodi Peters is doing very well with current medical  therapy.  She does appear to have mitral regurgitation, but this is not clearly  hemodynamically significant.  Hypertension is under very adequate  control.  Her chronic kidney disease and sinus bradycardia bear  watching, but no further intervention at present.  I will reassess this  nice woman in 1 year.    Sincerely,      Gerrit Friends. Dietrich Pates, MD, [facility]  Electronically Signed    RMR/MedQ  DD: 02/18/2008  DT: 02/19/2008  Job #: [id number]

## 2010-10-25 NOTE — Op Note (Signed)
NAME:  Jodi Peters, Jodi Peters                  ACCOUNT NO.:  [id number]   MEDICAL RECORD NO.:  [id number]          PATIENT TYPE:  AMB   LOCATION:  SDS                          FACILITY:  [facility]   PHYSICIAN:  Jillyn Hidden A. Rankin, M.D.   DATE OF BIRTH:  March 03, 1930   DATE OF PROCEDURE:  DATE OF DISCHARGE:  10/14/2007                               OPERATIVE REPORT   PREOPERATIVE DIAGNOSIS:  Aphakia, left eye.   POSTOPERATIVE DIAGNOSIS:  Aphakia, left eye.   PROCEDURE NOTE:  Insertion of posterior chamber intraocular lens  secondary - sulcus, left eye.   SURGEON:  Alford Highland. Rankin, MD   ANESTHESIA:  Local as per my anesthesia control.   INDICATIONS FOR PROCEDURE:  The patient is a 77 year old woman who has  aphakia of left eye and requires planned placement of anterior chamber  intraocular lens in the left eye as it was discussed.  The patient  understands this and attempt to just simply improve her peripheral  visual functions in the left eye so as to function better with the right  eye.  She understands the risk of anesthesia including the recurrence of  death, loss of the eye including, but not limited to hemorrhage,  infection, scarring, need for the surgery, no change of vision, loss of  vision, progressive disease despite intervention.  Appropriate signed  consent was obtained.   DESCRIPTION OF PROCEDURE:  The patient was taken to the operating room.  In the operating room, we put monitors followed by mild sedation.  A 2%  Xylocaine injected 5 mL in retrobulbar with additional 5 mL laterally  fascia modified Darel Hong.  The left periocular region sterilely prepped  and draped in the usual ophthalmic fashion.  A lid speculum was applied.  Conjunctivae __________ limited fashion.  Groove limbal incision was  then fashioned.  Anterior chamber was deepened.  Viscoat was placed into  the anterior chamber.  The pupils were widely dilated.  There is quite  clear to see that there is peripheral capsular  rim that is 270 degrees.  Using equivalent pin to the inferotemporal part it is quite clear that  there is only a small 8 o'clock hour radial break in the capsule and the  rest was fibrotic and appeared to have excellent snapback and excellent  integrity.  For this reason, posterior chamber intraocular lens was  selected.  An Alcon , model [id number] was then placed through limbal wound  which is now being opened and anterior sulcus rotated into the vertical  position with the access oriented at the 2 and 8 positions. It is must  be noted that the radial __________  defect was now at the 8 position,  but was in fact at the 4 o'clock position.   Optic was bigger than the apparent capsule opening peripherally.  Excellent centration was obtained.  No complications occurred.  The  globe was somewhat soft.  A 30-gauge needle was then used to deliver BSS  via pars plana with 30-gauge needle.   At this time, the limbal wound was closed with interrupted 10-0 nylon  sutures.  The conjunctiva was closed with 7-0 Vicryl.  The wound was  secured.  Subconjunctival Decadron applied.  Sterile patch and Fox  shield were applied.  The patient tolerated the procedure well without  complications.      Alford Highland Rankin, M.D.  Electronically Signed     GAR/MEDQ  D:  10/14/2007  T:  10/15/2007  Job:  [id number]

## 2010-10-28 NOTE — Op Note (Signed)
NAME:  RAECHAL, RABEN                  ACCOUNT NO.:  [id number]   MEDICAL RECORD NO.:  [id number]          PATIENT TYPE:  INP   LOCATION:  A340                          FACILITY:  [facility]   PHYSICIAN:  Jerolyn Shin C. Katrinka Blazing, M.D.   DATE OF BIRTH:  1929/07/20   DATE OF PROCEDURE:  DATE OF DISCHARGE:                                 OPERATIVE REPORT   PREOPERATIVE DIAGNOSIS:  Biliary pancreatitis, acute cholecystitis,  cholelithiasis.   POSTOPERATIVE DIAGNOSIS:  Biliary pancreatitis, acute cholecystitis,  cholelithiasis.   PROCEDURE:  Laparoscopic cholecystectomy.   SURGEON:  Dr. Katrinka Blazing.   DESCRIPTION:  Under general endotracheal anesthesia, the patient's abdomen  was prepped and draped in sterile field. Supraumbilical incision was made.  Veress needle was inserted uneventfully. Abdomen was insufflated with 2.5  liters of CO2. Using a Visiport guide, a 10-mm port was placed without  difficulty. Laparoscope was placed. Gallbladder was visualized. Under  videoscopic guidance, a 10-mm port and two 5-mm ports were placed in the  right subcostal region. The patient was placed in reversed Trendelenburg  position. The gallbladder was grasped and positioned. Because of tense  distention of the gallbladder, it was decompressed using a Weck needle.  After this was done, better position of the gallbladder could be carried  out. Cystic duct was carefully dissected. The cystic duct was acutely  inflamed as was the entire infundibulum of the gallbladder. There was  increased inflammation in the surrounding areas. Because of this and because  the cystic duct was significantly sclerosed and inflamed, it was decided not  to proceed with cholangiogram. Her amylase and lipase and LFTs were normal  in the preoperative period.   The cystic duct was dissected, clipped with 5 clips and divided. There were  2 cystic artery branches that were dissected, clipped with 3 clips and  divided. Using electrocautery, the  gallbladder was separated from the  intrahepatic bed without difficulty. It was placed in an EndoCatch device  and retrieved. Irrigation at the subphrenic space and the subhepatic space  including the gallbladder fossa was carried. There was essentially no  bleeding from the bed of the gallbladder. The patient tolerated the  procedure well. The scope was moved from the supraumbilical space to the  right upper quadrant, and view of the supraumbilical port shows that there  was no adhesions in the area where the port came through. No other  abnormality was noted except for some adhesions in the deep pelvis. CO2 was  allowed to escape from the abdomen, and the ports were removed. The incision  at the umbilicus was closed using 0 Dexon on the fascia and staples on the  skin. The other incisions were closed with staples on the skin. The patient  tolerated the procedure well. She was awakened from anesthesia uneventfully,  transferred to a bed, and taken to the post anesthetic care unit for further  monitoring.    Lero  LCS/MEDQ  D:  04/26/2004  T:  04/26/2004  Job:  [id number]

## 2010-10-28 NOTE — Op Note (Signed)
NAME:  Jodi Peters, Jodi Peters                  ACCOUNT NO.:  [id number]   MEDICAL RECORD NO.:  [id number]          PATIENT TYPE:  AMB   LOCATION:  DAY                           FACILITY:  [facility]   PHYSICIAN:  Dalia Heading, M.D.  DATE OF BIRTH:  Apr 21, 1930   DATE OF PROCEDURE:  10/01/2006  DATE OF DISCHARGE:                               OPERATIVE REPORT   PREOPERATIVE DIAGNOSIS:  Left breast neoplasm.   POSTOPERATIVE DIAGNOSIS:  Left breast neoplasm.   PROCEDURE:  Left partial mastectomy.   SURGEON:  Dr. Franky Macho.   ANESTHESIA:  General.   INDICATIONS:  The patient is a 77 year old black female who presents  with a biopsy-proven papilloma with sclerosis in the left upper, outer  quadrant of the breast.  The patient now comes to the operating room for  the left partial mastectomy.  Risks and benefits of procedure were fully  explained to the patient, gave informed consent.   PROCEDURE NOTE:  The patient was placed in supine position.  After  general anesthesia was administered, the left breast was prepped and  draped in the usual sterile technique with Betadine.  Surgical site  confirmation was performed.   A curvilinear incision was made in the upper, outer quadrant over the  palpable mass which measured approximately 1-2 cm in size.  The  dissection was taken down to the mass.  The mass excised without  difficulty.  Cystic contents were found.  The specimen was sent to  radiology for specimen radiography.  The previously placed clip was  noted be within the specimen removed.  The specimen was then sent to  pathology further examination.  The wound was irrigated with normal  saline.  Any bleeding was controlled using Bovie electrocautery.  0.5 cm  Sensorcaine was instilled in the surrounding area.  The skin was  reapproximated using a 4-0 Vicryl subcuticular suture.  Dermabond was  then applied.   All tape and needle counts correct end the procedure.  The patient was  wakened and  transferred to PACU in stable condition.   COMPLICATIONS:  None.   SPECIMEN:  Left breast tissue.   BLOOD LOSS:  Minimal.      Dalia Heading, M.D.  Electronically Signed     MAJ/MEDQ  D:  10/01/2006  T:  10/01/2006  Job:  [id number]   cc:   Franchot Heidelberg, M.D.

## 2010-10-28 NOTE — H&P (Signed)
NAME:  Jodi Peters, Jodi Peters                  ACCOUNT NO.:  [id number]   MEDICAL RECORD NO.:  [id number]          PATIENT TYPE:  INP   LOCATION:  A340                          FACILITY:  [facility]   PHYSICIAN:  Annia Friendly. Loleta Chance, M.D.   DATE OF BIRTH:  [birth year]   DATE OF ADMISSION:  04/19/2004  DATE OF DISCHARGE:  LH                                HISTORY & PHYSICAL   IDENTIFYING DATA:  The patient is a 77 year old, divorced, gravida 5, para  5, AB 0, retired [employer] employee, black female from [place],  [place].   CHIEF COMPLAINT:  Upper stomach pain.   HISTORY OF PRESENT ILLNESS:  Upper stomach pain started. on April 17, 2004, as a dull fullness confined to upper stomach.  The pain was mild in  nature.  However, on April 18, 2004, the pain appeared to decrease.  On  April 19, 2004, the pain returned and it was increased in severity.  The  pain was constant in nature.  She vomited several times on the day of  admission.  Vomitus was described as yellow and bitter tasting.  History is  negative for diarrhea, dysuria, gross hematuria, fever and chills.  The  patient incurred some abdominal distention on the day of admission.   MEDICAL HISTORY:  1.  Hypertension.  2.  Osteoarthritis.  3.  Hyperlipidemia.  4.  Right breast cancer.  Medical history is negative for diabetes, tuberculosis, sickle cell, asthma,  or seizure disorder.   ALLERGIES:  PENICILLIN - rash.   PRESCRIBED MEDICATIONS:  On admission are:  1.  Mobic 7.5 mg p.o. every day.  2.  One aspirin p.o. every day.  3.  Benicar HCT 40/25 one tablet p.o. every day.  4.  Lotrel 10/20 one tablet p.o. every day.  5.  Lipitor 10 mg p.o. every bedtime.  6.  Multivitamin one tablet p.o. every day.   HABITS:  Negative for tobacco and street drugs.  Habits positive for former  use of ethanol.   PAST HOSPITALIZATION HISTORY:  1.  Positive hospitalization for right breast surgery, in 1999, secondary to  cancer.  2.  Hysterectomy, at age 17, by Dr. Dickey Gave  secondary to heavy menstrual      bleeding and cervical cancer.  3.  Appendectomy, age 53, by Dr. Dickey Gave  .  4.  Back surgery, [facility].  5.  Ruptured disk x 2.  6.  Hospitalizations for multiple pregnancies.   FAMILY HISTORY:  Revealed mother deceased, age 45, cause unknown.  Father  deceased cause unknown.  One daughter living, age 83, health unknown.  Four  sons living, age 70 good health, age 63 history of hypertension, age 33 good  health, age 61 good health.   REVIEW OF SYSTEMS:  Positive for multiple joint pains and stiffness.  Chronic keloid scar of left ear.  Review of systems negative for chronic  headache, epistaxis, shortness of breath, chest pain, syncope, dizziness,  dysphagia, melena, edema of the legs, vaginal bleeding, weight loss, night  sweats, discharge from nipples, etcetera.  PHYSICAL EXAMINATION:  GENERAL APPEARANCE:  Revealed an elderly, medium  framed, medium height, black female who appeared not to feel well but in no  apparent respiratory distress.  VITAL SIGNS:  Temperature 101.2, blood pressure 161/89, pulse 110,  respirations 20.  HEENT:  Head positive for wig.  Ear positive deformity secondary to keloid  scarring.  Right auricle normal.  External canal patent.  Tympanic membrane  pearly gray.  Eyes, lids negative for ptosis.  Sclerae white.  Positive for  arcus senilis bilaterally.  Pupils round, equal, and reactive to light.  Extraocular movements intact.  Nose negative for discharge.  Mouth positive  for partial dentures.  No oral lesions.  Posterior pharynx benign.  NECK:  Negative for adenopathy or thyromegaly.  LUNGS:  Clear.  HEART:  Audible S1 and S2 without murmur.  Regular rate and rhythm.  BREAST:  No skin change.  No nodule on palpation.  Nipple erect without  discharge.  ABDOMEN:  Obese.  Hyperactive bowel sounds.  Positive old healed mid  hypogastric surgical scar .   Soft.  Positive for mid epigastric tenderness.  No palpable masses or organomegaly.  PELVIC:  External genitalia normal female.  RECTAL:  No external lesions.  Digital exam positive for stool in the rectal  vault.  Small amount.  Stool guaiac pending.  No rectal vault masses.  EXTREMITIES:  No edema.  No joint swelling.  No joint redness.  No joint  hotness.  NEUROLOGIC:  Alert and oriented to person, place and time.  Cranial nerves  II-XII appeared intact.  BACK: Lumbar-sacral area  positive for old healed vertical surgical scar.  LABS:  White count is 17,100, hemoglobin 13.5, hematocrit 39.9, platelets  397,000.  Sodium 141, potassium 3.2, chloride 106, CO2 26, glucose 118, BUN  17, creatinine 0.9.  Serum amylase 112.  Serum lipase greater than 2,000.   A CT of the abdomen was read as severe inflammatory process surrounding the  pancreas and most likely representing pancreatitis.  No abscess, or  pancreatic necrosis, or free air.  Calcified gallstone present suggesting  gallstone as etiology of pancreatitis as read by radiologist.   IMPRESSION:  1.  Primary acute abdominal pain secondary to pancreatitis.  2.  Cholelithiasis.  3.  Hypokalemia.   SECONDARY DIAGNOSES:  1.  Hypertension.  2.  Poly arthritis.  3.  Hyperlipidemia.  4.  Status post right breast lumpectomy secondary to cancer.   PLAN:  1.  NPO.  2.  IV fluids.  3.  Analgesics for pain.  4.  NG tube at low intermittent suction.  5.  IV antibiotics.  6.  Pepcid IV.  7.  Repeat CBC, serum amylase, lipase, and MET-7 early morning x 3.  8.  Surgical consult.  9.  Watch input and output.  10. Treat blood pressure with Catapres patch as needed.  11. Old chart to floor.       ___________________________________________  Annia Friendly. Loleta Chance, M.D.    Levonne Hubert  D:  04/19/2004  T:  04/19/2004  Job:  [id number]

## 2010-10-28 NOTE — Procedures (Signed)
NAME:  Jodi Peters, Jodi Peters                            ACCOUNT NO.:  [id number]   MEDICAL RECORD NO.:  [id number]                   PATIENT TYPE:  OUT   LOCATION:  RAD                                  FACILITY:  [facility]   PHYSICIAN:  Dani Gobble, MD                    DATE OF BIRTH:  1930-04-28   DATE OF PROCEDURE:  11/11/2003  DATE OF DISCHARGE:                                  ECHOCARDIOGRAM   REFERRING PHYSICIAN:  Annia Friendly. Loleta Chance, M.D.   INDICATION:  Ms. Leece is a 77 year old female who has a past medical  history of hypertension and was found to have a systolic murmur.   1. The aorta was within normal limits at 2.5 cm.  2. The left atrium is mildly dilated at 4.2 cm.  The patient appeared to be     in sinus rhythm during the procedure.  3. The interventricular septum is moderately thickened at 1.6 cm with     additional basal septal hypertrophy overlay.  The posterior wall also     appears to be mildly thickened.  4. The aortic valve appears to be trileaflet and pliable with minimally     diminished leaflet excursion.  No significant aortic insufficiency is     noted.  Doppler interrogation of the aortic valve reveals a peak velocity     of 2.3 meters per second corresponding to a peak gradient of 21 mmHg,     mean gradient of 12 mmHg.  There does appear to be a step up in the LVOT     on pulse wave Doppler.  This is suggestive of a dynamic LVOT obstruction.  5. The mitral valve appears reasonably structurally normal with normal     leaflet excursion.  At times, there is suggestion of systolic anterior     motion of the mitral valve, but this is not well visualized on the study.     Mild mitral regurgitation is noted. Doppler interrogation of the mitral     valve is within normal limits.  Mild mitral annular calcification is     noted.  6. The pulmonic valve is not well visualized, but mild pulmonic     insufficiency is noted.  7. Tricuspid valve appears grossly structurally normal with  moderate     tricuspid regurgitation.  Estimated RV SP is approximately 39 mmHg.  8. The left ventricle is somewhat small in size with the LV IDD measured at     3.8 cm and LV ISD measured at 2.5 cm.  Overall left ventricular systolic     function appears normal to vigorous, and no regional wall motion     abnormalities are noted.  9. The right atrium and right ventricle appear to be normal in size.  The     right ventricle appears to have normal right ventricular systolic     function.  The presence of diastolic dysfunction is inferred from pulse     wave Doppler across the mitral valve.   IMPRESSION:  1. Mild left atrial enlargement.  2. Moderate asymmetric septal hypertrophy with distal basal septal     hypertrophy overlay as is common in the elderly.  The posterior wall also     appears to be mildly thickened.  3. The aortic valve proper appears to be trileaflet and pliable with only     minimal decrease in leaflet excursion.  Velocities in the LVOT are     elevated suggestive of a dynamic left ventricular outflow tract     obstruction to a mild degree at rest.  4. There is intermittent suggestion of systolic anterior motion of the     mitral valve, but this is not well visualized on the study.  5. Mild mitral regurgitation.  6. Mild tricuspid regurgitation.  7. Mild pulmonic insufficiency.  8. Small left ventricular cavity size with normal to hyperdynamic left     ventricular systolic function without regional wall motion abnormality     noted.  9. Presence of diastolic dysfunction is inferred from pulse wave Doppler     across the mitral valve.  10.      Mild mitral annular calcification.  11.      Consider either repeat transthoracic echocardiogram in six months     or transesophageal echocardiogram for improved delineation of the cardiac     structures.      ___________________________________________                                            Dani Gobble, MD    AB/MEDQ  D:  11/11/2003  T:  11/12/2003  Job:  [id number]   cc:   Annia Friendly. Loleta Chance, M.D.  [address]  [place]  [place] [address]  Fax: [phone]

## 2010-10-28 NOTE — H&P (Signed)
NAME:  Jodi Peters, Jodi Peters                  ACCOUNT NO.:  [id number]   MEDICAL RECORD NO.:  [id number]          PATIENT TYPE:  AMB   LOCATION:  DAY                           FACILITY:  [facility]   PHYSICIAN:  Dalia Heading, M.D.  DATE OF BIRTH:  1929/11/04   DATE OF ADMISSION:  10/01/2006  DATE OF DISCHARGE:  LH                              HISTORY & PHYSICAL   CHIEF COMPLAINT:  Left breast neoplasm.   HISTORY OF PRESENT ILLNESS:  The patient is a 77 year old black female  who is referred for evaluation and treatment of a left breast neoplasm.  She had a core biopsy of the left breast mass which showed a benign  sclerosing papilloma.  Due to the increased risk for breast cancer, she  now presents for excision.  She has had a right partial mastectomy in  the past by Dr. Katrinka Blazing.  No nipple discharge or immediate family history  of breast carcinoma is noted.   PAST MEDICAL HISTORY:  1. Hypertension.  2. Arthritis.   PAST SURGICAL HISTORY:  1. Appendectomy.  2. Hysterectomy.  3. Multiple back surgeries.  4. Right partial mastectomy.  5. Cholecystectomy.   CURRENT MEDICATIONS:  Crestor, Benicar, Lotrel, Mobic, Tylenol as  needed, eye drops.   ALLERGIES:  1. PENICILLIN.  2. ADHESIVE TAPE.   REVIEW OF SYSTEMS:  The patient denies drinking or smoking.  She denies  any other cardiopulmonary difficulties or bleeding disorders.   PHYSICAL EXAMINATION:  GENERAL:  The patient is a well-developed, well-  nourished black female in no acute distress.  LUNGS:  Clear to auscultation, with equal breath sounds bilaterally.  HEART:  Reveals a regular rate and rhythm, without S3, S4, or murmurs.  BREASTS:  Right breast examination reveals no dominant mass, nipple  discharge, or dimpling.  The axilla is negative for palpable nodes.  Left breast examination reveals a 1.5 cm nodule noted at the 1 o'clock  position.  No nipple discharge or dimpling is noted.  The axilla is  negative for palpable nodes.   IMPRESSION:  Left breast neoplasm, unspecified.   PLAN:  The patient is scheduled for a left partial mastectomy on October 01, 2006.  The risks and benefits of the procedure, including bleeding  and infection, were fully explained to the patient, who gave informed  consent.      Dalia Heading, M.D.  Electronically Signed     MAJ/MEDQ  D:  09/27/2006  T:  09/28/2006  Job:  [id number]   cc:   Short Stay, [facility]   Franchot Heidelberg, M.D.

## 2010-10-28 NOTE — Discharge Summary (Signed)
NAME:  Jodi Peters, Jodi Peters                  ACCOUNT NO.:  [id number]   MEDICAL RECORD NO.:  [id number]          PATIENT TYPE:  INP   LOCATION:  A340                          FACILITY:  [facility]   PHYSICIAN:  Annia Friendly. Loleta Chance, MD     DATE OF BIRTH:  [birth year]   DATE OF ADMISSION:  04/19/2004  DATE OF DISCHARGE:  11/19/2005LH                                 DISCHARGE SUMMARY   The patient is a 77 year old, divorced, gravid 5, para 5, AB 0, retired  [employer] employee black female from [place], [place].   CHIEF COMPLAINT:  Upper stomach pain.   HISTORY OF PRESENT ILLNESS:  Upper stomach pain started on April 17, 2004,  as a dull fullness.  The pain was mild in nature.  However, on April 18, 2004, the pain appeared to decrease.  On April 19, 2004, the pain  returned.  It was increasingly severe.  The pain was constant in nature.  She vomited several times on the day of admission.  Vomitus was described as  yellow and bilious.  History was negative for diarrhea, dysuria, gross  hematuria, fever, and chills.  She did incur some abdominal distention on  the day of admission.   Medical history positive for hypertension, osteoarthritis, hyperlipidemia,  and right breast cancer.   ALLERGIES:  The patient was allergic to PENICILLIN (rash).   HABITS:  Negative for tobacco, ethanol, and street drugs.   PAST MEDICAL HISTORY/HOSPITALIZATIONS:  1.  Positive for hospitalization for right breast surgery in 1999 secondary      to cancer.  2.  Hysterectomy August 30 by Dr. Dickey Gave secondary to heavy menstrual      bleeding and cervical cancer.  3.  Appendectomy at age 72 by Dr. Dickey Gave.  4.  Back surgery at [facility] x 2.  5.  Hospitalization for multiple pregnancies.   FAMILY HISTORY:  Mother deceased at age 13, cause unknown.  Father deceased,  cause unknown. One daughter is living at age 38, health unknown; four sons  living, one at age 46 in good health, age 25 with  history of hypertension,  age 7 in good health, age 40 in good health.   PHYSICAL EXAMINATION:  GENERAL:  Elderly, medium-frame, medium-height, black  female who appeared not to feel well but in no apparent respiratory  distress.  VITAL SIGNS:  Temperature 101.2, blood pressure 161/89, pulse 110,  respirations 20.  SKIN: Hot and dry.  HEENT:  Sclerae white.  Posterior pharynx benign.  LUNGS:  Clear.  BREASTS:  Examination of breasts demonstrated healed surgical scar involving  the right breast.  Examination of the breasts demonstrated no palpable  masses or discharge from nipples.  HEART:  Audible S1 and S2 without murmur.  Rhythm was regular.  ABDOMEN: Somewhat distended with hypoactive bowel sounds.  Positive for old  healed mid hypogastric surgical scar.  Soft and positive for mid epigastric  tenderness.  Abdominal exam demonstrated no palpable masses.  RECTAL:  Exam demonstrated no external lesions.  On digital exam, guaiac of  stool was negative.  Digital exam demonstrated no rectal vault masses.   LABORATORY AND X-RAY DATA:  Significant labs on admission;  White count  17,100, hemoglobin 13.5, hematocrit 39.9, platelets 397,000.  Sodium 141,  potassium 3.2, chloride 106, CO2 26, glucose 118, BUN 17, creatinine 0.9,  calcium 9.7, total protein 8.0, albumin 4.7, AST 243, ALT 127, alkaline  phosphatase 150, total bilirubin 0.7.  Serum amylase 112, serum lipase  greater than 2000.  Urinalysis;  Specific gravity 1.020, pH 7.0.  No  glucose, no hemoglobin, no bilirubin, no ketones, no protein, nitrite  negative.   HOSPITAL COURSE:  #1.  ACUTE PANCREATITIS:  The patient was admitted to a medical floor.  She  was made n.p.o.  X-ray of abdomen on admission was read as benign by Dr.  Francene Boyers.  CT of the abdomen with contrast was read as severe acute  inflammatory process, primarily centered in the retroperitoneum surrounding  the pancreas.  This was most likely representative of  severe pancreatitis  and may be on the basis of gallstone pancreatitis given evidence of  calcified gallstones in the gallbladder.  There was no evidence of focal  abscess or bowel perforation.  CT of the pelvis with contrast was read as no  acute findings in the pelvis.   The patient was also treated with IV fluids, IV Pepcid, IV antibiotics,  analgesia IV for pain, and repeat labs including amylase, lipase, MET-7,  etc.  Also, NG tube was inserted with suction.  The patient's white count  initially dropped down to 11,500 on April 20, 2004.  White count began to  creep up on April 21, 2004, with a value of 13,200.  White count reached  a high again of 16,300 on April 23, 2004.  Amylase decreased to 102 on  April 27, 2004.  Serum lipase decreased to 60 on April 25, 2004.  The  patient responded to treatment of acute pancreatitis with this treatment  modality.   #2.  CHOLELITHIASIS:  After resolution of acute pancreatitis, Dr. Elpidio Anis, surgeon, saw the patient and recommended cholecystectomy.  The  patient underwent laparoscopic cholecystectomy without complication on  April 26, 2004.  Findings during surgical procedure was acute  inflammation and distention of the gallbladder with increased inflammation  of the ductal structure and infundibulum.  The cystic duct was too  sclerosed/inflamed to do cholangiogram.  The patient was followed with daily  liver function tests.  Repeat liver function tests on April 22, 2004,  revealed AST 62, ALT 46, alkaline phosphatase 143, total bilirubin 0.8.  On  April 28, 2004, AST 52, ALT 41, alkaline phosphatase 135, total bilirubin  0.5.  The patient's diet was advanced from clear liquids to regular on  April 28, 2004.  She tolerated diet without problem.  She was discharged  home on April 30, 2004.  She was alert and oriented to person, place, and time.  Abdomen had decreased significantly compared to admission.   Examination of wound site revealed no redness.  The patient had minimum  drainage at operative site and multiple wound sites.  Abdomen was soft.  The  patient had experienced several bowel movements before discharge.  She was  voiding without problems.  She was alert and oriented to person, place, and  time.  Labs showed white count 12,100 on April 27, 2004.   #3.  HYPOKALEMIA:  Serum potassium on admission was 3.2.  Her last serum  potassium done on April 27, 2004, was 3.5 with sodium  140, chloride 100,  CO2 24.  BUN on April 27, 2004, was 4, and creatinine was 0.8.   #4.  HYPERTENSION:  Blood pressure was initially 161/89 with a pulse of 110.  Blood pressure was treated with a Catapres patch during this  hospitalization.  Lung fields were clear at time of discharge.  Heart exam  revealed audible S1 and S2 without murmur.  Rhythm was regular.  The patient  did not complain of chest pain or shortness of breath during this  hospitalization.  EKG during this hospitalization revealed a normal sinus  rhythm and normal EKG on April 25, 2004, by Dr. Carylon Perches.   #5.  OSTEOARTHRITIS:  Examination of joints demonstrated no redness,  hotness, or swelling during this hospitalization.  The patient did not  complain of joint pain during this hospitalization.   #6.  HYPERLIPIDEMIA:  The patient was not treated pertaining to this problem  during hospitalization.  She will resume medications for elevated  cholesterol as outpatient.   Pathology report for gallbladder surgery revealed gallbladder specimen  revealing subacute and chronic inflammation and cholelithiasis as read by  Dr. Renard Hamper on April 27, 2004.   DISCHARGE INSTRUCTIONS:  1.  Diet:  Low salt and low cholesterol.  2.  Activity:  Increase slowly.   DISCHARGE MEDICATIONS:  1.  Benicar HCT 40/25 one tablet p.o. every day.  2.  Lotrel 10/21 capsule p.o. every day.  3.  Lipitor 10 mg 1 tablet every bedtime.  4.   Multivitamins 1 tablet p.o. every day.  5.  Metoclopramide 7 mg 1 tablet 30 minutes before meals and bedtime.  6.  Famotidine 20 mg 1 tablet every bedtime.  7.  Levaquin 500 mg 1 tablet every day.  8.  Oxycodone/APAP 5/500 one tablet every 4 to 6 hours as needed for pain.   The patient was advised to take off Catapres patch on Monday (2 days after  being discharged from the hospital).   FOLLOW UP:  1.  Follow up with Dr. Katrinka Blazing, surgeon, in one week.  2.  Follow up with medical doctor in two weeks.   FINAL DIAGNOSES:  1.  Acute pancreatitis.  2.  Acute cholecystitis with cholelithiasis.   SECONDARY DIAGNOSES:  1.  Hypertension.  2.  Hyperlipidemia.  3.  Osteoarthritis.  4.  Status post right breast lumpectomy secondary to cancer.     Stephannie Li  D:  04/30/2004  T:  04/30/2004  Job:  [id number]

## 2011-01-07 ENCOUNTER — Other Ambulatory Visit: Payer: Self-pay | Admitting: Cardiology

## 2011-02-09 ENCOUNTER — Ambulatory Visit (HOSPITAL_COMMUNITY)
Admission: RE | Admit: 2011-02-09 | Discharge: 2011-02-09 | Disposition: A | Discharge status: Home / Self Care | Payer: Medicare Other | Source: Ambulatory Visit | Attending: Internal Medicine | Admitting: Internal Medicine

## 2011-02-09 ENCOUNTER — Other Ambulatory Visit (HOSPITAL_COMMUNITY): Payer: Self-pay | Admitting: Internal Medicine

## 2011-02-09 DIAGNOSIS — J310 Chronic rhinitis: Secondary | ICD-10-CM | POA: Insufficient documentation

## 2011-02-09 DIAGNOSIS — J329 Chronic sinusitis, unspecified: Secondary | ICD-10-CM | POA: Insufficient documentation

## 2011-02-09 DIAGNOSIS — R42 Dizziness and giddiness: Secondary | ICD-10-CM | POA: Insufficient documentation

## 2011-03-02 LAB — APTT: aPTT: 22 — ABNORMAL LOW

## 2011-03-02 LAB — BASIC METABOLIC PANEL
Calcium: 9.9
GFR calc Af Amer: >60
GFR calc non Af Amer: 51 — ABNORMAL LOW
Glucose, Bld: 129 — ABNORMAL HIGH
Potassium: 3.3 — ABNORMAL LOW
Sodium: 137

## 2011-03-02 LAB — CBC
HCT: 38.4
Hemoglobin: 13.1
RDW: 14.1
WBC: 11.1 — ABNORMAL HIGH

## 2011-03-02 LAB — PROTIME-INR: INR: 0.9

## 2011-03-07 LAB — CBC
Hemoglobin: 12.3
Platelets: 295
RDW: 14
WBC: 8.6

## 2011-03-07 LAB — COMPREHENSIVE METABOLIC PANEL
ALT: 15
Albumin: 4.4
Alkaline Phosphatase: 91
Chloride: 103
Glucose, Bld: 126 — ABNORMAL HIGH
Potassium: 3.5
Sodium: 139
Total Bilirubin: 0.4
Total Protein: 7.6

## 2011-04-13 ENCOUNTER — Encounter: Payer: Self-pay | Admitting: Adult Health

## 2011-04-14 ENCOUNTER — Encounter: Payer: Self-pay | Admitting: Cardiology

## 2011-04-18 ENCOUNTER — Encounter: Payer: Self-pay | Admitting: Adult Health

## 2011-04-18 ENCOUNTER — Ambulatory Visit (INDEPENDENT_AMBULATORY_CARE_PROVIDER_SITE_OTHER): Payer: Medicare Other | Admitting: Adult Health

## 2011-04-18 DIAGNOSIS — I10 Essential (primary) hypertension: Secondary | ICD-10-CM

## 2011-04-18 DIAGNOSIS — I498 Other specified cardiac arrhythmias: Secondary | ICD-10-CM

## 2011-04-18 NOTE — Patient Instructions (Signed)
Your physician recommends that you schedule a follow-up appointment in: 1 Year with Dr Dietrich Pates.  You will receive a letter in the mail.

## 2011-04-18 NOTE — Progress Notes (Signed)
HPI: Jodi Peters is a pleasant 77 y/o patient of Dr.Rothbart we are seeing on annual follow-up with known history of hypertension, mitral regurgitation, sinus bradycardia and chronic kidney disease. She has had recent new diagnosis of back spurs and arthritis of the right hip, for which she has been receiving steroid injections. However, she has stopped these injections because of concerns for meningitis reports from these injections and has only po pain control for this now. She has had no hospitalizations or ER visits. No new allergies.  She is without complaint except for above mentioned arthritis issues. She admits to not being very active. She is followed by Dr. Felecia Shelling for cholesterol evaluation and treatment.  Allergies  Allergen Reactions  . Penicillins     REACTION: Rash    Current Outpatient Prescriptions  Medication Sig Dispense Refill  . amLODipine (NORVASC) 5 MG tablet Take 5 mg by mouth daily.        Marland Kitchen aspirin 81 MG tablet Take 81 mg by mouth daily.        . benazepril (LOTENSIN) 20 MG tablet Take 20 mg by mouth daily.        . brimonidine (ALPHAGAN) 0.15 % ophthalmic solution 1 drop 3 (three) times daily.        . calcium-vitamin D (OSCAL WITH D) 500-200 MG-UNIT per tablet Take 1 tablet by mouth daily.        . chlorthalidone (HYGROTON) 25 MG tablet Take 12.5 mg by mouth daily.       . Choline Fenofibrate (TRILIPIX) 135 MG capsule Take 135 mg by mouth daily.        . dorzolamide-timolol (COSOPT) 22.3-6.8 MG/ML ophthalmic solution 1 drop 2 (two) times daily.        Marland Kitchen HYDROcodone-acetaminophen (VICODIN) 5-500 MG per tablet Take 1 tablet by mouth every 6 (six) hours as needed.        . mirtazapine (REMERON) 15 MG tablet Take 15 mg by mouth at bedtime.        . Multiple Vitamin (MULTI-VITAMIN DAILY PO) Take by mouth.        . Naproxen Sodium (ALEVE) 220 MG CAPS Take by mouth.        . Omega-3 Fatty Acids (FISH OIL PO) Take by mouth.        . traMADol (ULTRAM) 50 MG tablet Take 50 mg by  mouth every 6 (six) hours as needed. Maximum dose= 8 tablets per day       . travoprost, benzalkonium, (TRAVATAN) 0.004 % ophthalmic solution 1 drop at bedtime.        Marland Kitchen DISCONTD: chlorthalidone (HYGROTON) 25 MG tablet TAKE (1/2) TABLET BY     MOUTH DAILY.  15 tablet  0    Past Medical History  Diagnosis Date  . Hyperlipidemia   . Hypertension   . Mitral regurgitation     sinus bradycardia  . Breast pain     leftr  . Venous insufficiency   . Allergic rhinitis   . Seborrheic keratosis   . Breast mass     left  . Vertigo   . Carpal tunnel syndrome   . Cataract   . Glaucoma   . Constipation   . IBS (irritable bowel syndrome)   . Osteopenia   . Osteoarthritis   . Low back pain   . Breast cancer   . Anxiety     Past Surgical History  Procedure Date  . Appendectomy   . Cholecystectomy   . Breast lumpectomy   .  Keloid     left ear    ION:GEXBMW of systems complete and found to be negative unless listed above PHYSICAL EXAM BP 148/65  Pulse 55  Resp 16  Ht 5\' 4"  (1.626 m)  Wt 148 lb (67.132 kg)  BMI 25.40 kg/m2  General: Well developed, well nourished, in no acute distress Head: Eyes PERRLA, No xanthomas.   Normal cephalic and atramatic  Lungs: Clear bilaterally to auscultation and percussion. Heart: HRRR S1 S2,  2/6 systolic murmur at the apex.  Pulses are 2+ & equal.            No carotid bruit. No JVD.  No abdominal bruits. No femoral bruits. Abdomen: Bowel sounds are positive, abdomen soft and non-tender without masses or                  Hernia's noted. Msk:  Back normal, normal gait. Normal strength and tone for age. Extremities: No clubbing, cyanosis or edema.  DP +1 Neuro: Alert and oriented X 3. Psych:  Good affect, responds appropriately  EKG: Sinus bradycardia rate of 55 bpm.   ASSESSMENT AND PLAN

## 2011-04-18 NOTE — Assessment & Plan Note (Signed)
She is essentially well controlled. Will make no changes at this time. BP range in the 140's systolic is preferred for her age group as this is tolerated better. She will continue the norvasc and benazepril at current doses.

## 2011-04-18 NOTE — Assessment & Plan Note (Signed)
HR rate is not extremely low on this office visit. She is asymptomatic without chest pain, shortness of breath or dizziness. Will continue to monitor this.  She is stable at present.

## 2011-04-25 ENCOUNTER — Other Ambulatory Visit (HOSPITAL_COMMUNITY): Payer: Self-pay | Admitting: Internal Medicine

## 2011-04-25 DIAGNOSIS — Z139 Encounter for screening, unspecified: Secondary | ICD-10-CM

## 2011-05-09 ENCOUNTER — Other Ambulatory Visit: Payer: Self-pay | Admitting: Cardiology

## 2011-05-10 ENCOUNTER — Encounter (HOSPITAL_COMMUNITY): Payer: Medicare Other | Attending: Oncology | Admitting: Oncology

## 2011-05-10 ENCOUNTER — Other Ambulatory Visit: Payer: Self-pay | Admitting: *Deleted

## 2011-05-10 ENCOUNTER — Encounter (HOSPITAL_COMMUNITY): Payer: Self-pay | Admitting: Oncology

## 2011-05-10 VITALS — BP 115/61 | HR 53 | Temp 98.8°F | Ht 64.5 in | Wt 148.4 lb

## 2011-05-10 DIAGNOSIS — L910 Hypertrophic scar: Secondary | ICD-10-CM

## 2011-05-10 DIAGNOSIS — Z853 Personal history of malignant neoplasm of breast: Secondary | ICD-10-CM

## 2011-05-10 DIAGNOSIS — M1990 Unspecified osteoarthritis, unspecified site: Secondary | ICD-10-CM

## 2011-05-10 DIAGNOSIS — M4800 Spinal stenosis, site unspecified: Secondary | ICD-10-CM

## 2011-05-10 MED ORDER — CHLORTHALIDONE 25 MG PO TABS: 12.5000 mg | ORAL_TABLET | Freq: Every day | ORAL | Status: DC

## 2011-05-10 NOTE — Patient Instructions (Signed)
[facility]  Discharge Instructions Jodi Peters  [id number] Nov 12, 1929  RECOMMENDATIONS MADE BY THE CONSULTANT AND ANY TEST RESULTS WILL BE SENT TO YOUR REFERRING DOCTOR.   EXAM FINDINGS BY MD TODAY AND SIGNS AND SYMPTOMS TO REPORT TO CLINIC OR PRIMARY MD: doing well  INSTRUCTIONS GIVEN AND DISCUSSED: Call us if you have any problems  SPECIAL INSTRUCTIONS/FOLLOW-UP: Return to Clinic one year   I acknowledge that I have been informed and understand all the instructions given to me and received a copy. I do not have any more questions at this time, but understand that I may call the Specialty Clinic at [facility] at [phone] during business hours should I have any further questions or need assistance in obtaining follow-up care.    __________________________________________  _____________  __________ Signature of Patient or Authorized Representative            Date                   Time    __________________________________________ Nurse's Signature
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
## 2011-05-10 NOTE — Progress Notes (Signed)
This office note has been dictated.

## 2011-06-08 ENCOUNTER — Ambulatory Visit (HOSPITAL_COMMUNITY)
Admission: RE | Admit: 2011-06-08 | Discharge: 2011-06-08 | Disposition: A | Discharge status: Home / Self Care | Payer: Medicare Other | Source: Ambulatory Visit | Attending: Internal Medicine | Admitting: Internal Medicine

## 2011-06-08 DIAGNOSIS — Z139 Encounter for screening, unspecified: Secondary | ICD-10-CM

## 2011-06-08 DIAGNOSIS — Z1231 Encounter for screening mammogram for malignant neoplasm of breast: Secondary | ICD-10-CM | POA: Insufficient documentation

## 2012-04-26 ENCOUNTER — Other Ambulatory Visit (HOSPITAL_COMMUNITY): Payer: Self-pay | Admitting: Oncology

## 2012-04-26 DIAGNOSIS — Z139 Encounter for screening, unspecified: Secondary | ICD-10-CM

## 2012-05-08 ENCOUNTER — Encounter (HOSPITAL_COMMUNITY): Payer: Medicare Other | Attending: Oncology | Admitting: Oncology

## 2012-05-08 VITALS — BP 146/76 | HR 61 | Temp 98.2°F | Resp 16 | Wt 146.2 lb

## 2012-05-08 DIAGNOSIS — Z853 Personal history of malignant neoplasm of breast: Secondary | ICD-10-CM

## 2012-05-08 NOTE — Patient Instructions (Signed)
[facility] Discharge Instructions  RECOMMENDATIONS MADE BY THE CONSULTANT AND ANY TEST RESULTS WILL BE SENT TO YOUR REFERRING PHYSICIAN.  EXAM FINDINGS BY THE PHYSICIAN TODAY AND SIGNS OR SYMPTOMS TO REPORT TO CLINIC OR PRIMARY PHYSICIAN: Exam findings as discussed by Dr. Mariel Sleet.  SPECIAL INSTRUCTIONS/FOLLOW-UP: 1.  Begin taking Colace 3 pills twice daily for 3-5 days to help resolve your constipation issues.  Contact our office on December 6th to let us know how your constipation is resolving. 2.  We will see you again in 1 year.  Thank you for choosing [facility] to provide your oncology and hematology care.  To afford each patient quality time with our providers, please arrive at least 15 minutes before your scheduled appointment time.  With your help, our goal is to use those 15 minutes to complete the necessary work-up to ensure our physicians have the information they need to help with your evaluation and healthcare recommendations.    Effective January 1st, 2014, we ask that you re-schedule your appointment with our physicians should you arrive 10 or more minutes late for your appointment.  We strive to give you quality time with our providers, and arriving late affects you and other patients whose appointments are after yours.    Again, thank you for choosing [facility].  Our hope is that these requests will decrease the amount of time that you wait before being seen by our physicians.       _____________________________________________________________  I acknowledge that I have been informed and understand all the instructions given to me and received a copy. I do not have anymore questions at this time but understand that I may call the Cancer Center at [facility] at [phone] during business hours should I have any further questions or need assistance in obtaining follow-up care.    __________________________________________   _____________  __________ Signature of Patient or Authorized Representative            Date                   Time    __________________________________________ Nurse's Signature

## 2012-05-08 NOTE — Progress Notes (Signed)
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 Her cancer was ER positive at 80%, PR +30%, HER-2/neu was 0, Ki-67 marker was less than 10%. She'll lumpectomy on 04/12/1999 followed by radiation therapy. She also had negative margins I should add. She was observed after surgery and radiation and still remains disease free. Problem #2 severe spinal stenosis Problem #3 constipation little bit more than usual for the last 5-7 days and if this does not resolve with just stool softeners 3 Colace twice a day over the next 5-7 days she needs to be in touch with me or her primary care physician. She has never had colonoscopy and does not want one but denies seen blood in her stools. Problem #4 chronic keloid formation in multiple areas Problem #5 lumbar laminectomy x2 Problem #6 hypertension Problem #7 glaucoma Problem #8 DJD Problem #9 cholecystectomy November 2005 Problem #10 TAH with unilateral salpingo-oophorectomy for early uterine cancer 1963 This pleasant lady is 78 years old and still does not look her stated age. Her only positive finding her review of systems is this change in bowel habits over the last week. It may only be 7-5 days actually. She is gradually lost a little weight over the last several years but this is not 10% of her body weight. She states her appetite is still excellent.  Her vital signs otherwise stable weight is down 2 pounds in one year. She is in no acute distress. Keloids are very easily observed. Lungs are clear. Heart shows a regular rhythm occasionally skipped beat. Is no S3 gallop but she does have a grade 1/6 systolic ejection murmur that I can appreciate. Abdomen is soft and nontender without hepatosplenomegaly. She has no leg edema no arm edema both breasts are without masses. She has several keloids on her chest where she's had surgery and her left ear where her ears were pierced many years  ago. She promises to call back in 7-14 days about her bowels. We will try to obtain her blood work that was done at her primary care physician's office within the last 2 weeks she states which she feels was quite extensive. I would definitely like to see her hemoglobin. If all is well she will see [place] in one year

## 2012-05-17 ENCOUNTER — Telehealth (HOSPITAL_COMMUNITY): Payer: Self-pay | Admitting: Oncology

## 2012-05-17 NOTE — Telephone Encounter (Signed)
The patient called in followup about her constipation which is much improved. This is somewhat of a change in her bowel habits since this is a prolonged period of constipation, even though constipation has been with her for many years. I have recommended a GI consultation for possible colonoscopy but she absolutely declines. Therefore I want her to keep her bowels under observation but continue the stool softeners. If she changes her mind about colonoscopy I have asked her to call us.

## 2012-06-10 ENCOUNTER — Ambulatory Visit (HOSPITAL_COMMUNITY)
Admission: RE | Admit: 2012-06-10 | Discharge: 2012-06-10 | Disposition: A | Discharge status: Home / Self Care | Payer: Medicare Other | Source: Ambulatory Visit | Attending: Oncology | Admitting: Oncology

## 2012-06-10 DIAGNOSIS — Z1231 Encounter for screening mammogram for malignant neoplasm of breast: Secondary | ICD-10-CM | POA: Insufficient documentation

## 2012-06-10 DIAGNOSIS — Z139 Encounter for screening, unspecified: Secondary | ICD-10-CM

## 2013-03-14 ENCOUNTER — Ambulatory Visit (HOSPITAL_COMMUNITY)
Admission: RE | Admit: 2013-03-14 | Discharge: 2013-03-14 | Disposition: A | Discharge status: Home / Self Care | Payer: Medicare Other | Source: Ambulatory Visit | Attending: Internal Medicine | Admitting: Internal Medicine

## 2013-03-14 ENCOUNTER — Other Ambulatory Visit: Payer: Self-pay

## 2013-03-14 DIAGNOSIS — R0989 Other specified symptoms and signs involving the circulatory and respiratory systems: Secondary | ICD-10-CM | POA: Insufficient documentation

## 2013-03-14 DIAGNOSIS — I369 Nonrheumatic tricuspid valve disorder, unspecified: Secondary | ICD-10-CM

## 2013-03-14 DIAGNOSIS — R0609 Other forms of dyspnea: Secondary | ICD-10-CM | POA: Insufficient documentation

## 2013-03-14 DIAGNOSIS — E785 Hyperlipidemia, unspecified: Secondary | ICD-10-CM | POA: Insufficient documentation

## 2013-03-14 DIAGNOSIS — I10 Essential (primary) hypertension: Secondary | ICD-10-CM | POA: Insufficient documentation

## 2013-03-14 NOTE — Progress Notes (Signed)
*  PRELIMINARY RESULTS* Echocardiogram 2D Echocardiogram has been performed.  Jodi Peters 03/14/2013, 3:51 PM

## 2013-04-01 ENCOUNTER — Ambulatory Visit (INDEPENDENT_AMBULATORY_CARE_PROVIDER_SITE_OTHER): Payer: Medicare Other | Admitting: Adult Health

## 2013-04-01 ENCOUNTER — Encounter: Payer: Self-pay | Admitting: Adult Health

## 2013-04-01 VITALS — BP 147/65 | HR 40 | Ht 64.0 in | Wt 142.0 lb

## 2013-04-01 DIAGNOSIS — I10 Essential (primary) hypertension: Secondary | ICD-10-CM

## 2013-04-01 DIAGNOSIS — I498 Other specified cardiac arrhythmias: Secondary | ICD-10-CM

## 2013-04-01 NOTE — Progress Notes (Signed)
HPI: Jodi Peters is an 79 year old former patient of Dr. Dietrich Pates will now be followed by Dr. Beulah Gandy, with known history of hypertension, mitral regurg, sinus bradycardia, chronic kidney disease. She was last seen in the office on 04/18/2011. She was recently seen by her primary care physician Dr. Felecia Shelling on 03/25/2013, with complaints of dizziness. The patient was found to be bradycardic heart rate of 56 beats per minute. A copy of EKG was sent with her records prior to this office visit revealing sinus bradycardia rate of 53 beats per minute.   The patient states she has been feeling dizzy and 3 day for several months. She states that she takes the medications at night, and in the morning just a multivitamin and aspirin. She states that she finishes her preference and consent to the sink to wash dishes usually has a headache and near syncopal episode was standing at the sink. She states that she sits down and rests and symptoms resolve. She occasionally has positional dizziness. With no near syncope. She denies chest pain or dyspnea on exertion.   The patient has had an echocardiogram completed on 03/14/2013 at the request of Dr. Felecia Shelling. This revealed that the left cavity size was normal there was a focal basal hypertrophy, there is evidence of systolic anterior motion of the anterior mitral valve leaflet causing mild dynamic obstructive gradient (peak velocity 1.6 ms, peak gradient 11 mmHg). Systolic function was vigorous, estimated ejection fraction was in the range of 65-70%. Wall motion was normal without regional wall motion abnormalities. Indeterminate systolic dysfunction. Aortic valve is mildly calcified trileaflet, mitral valve mildly calcified mildly thickened leaflets mild regurgitation, left atrium was severely dilated. Right atrium was mildly dilated, tricuspid valve moderate regurg, pulmonary artery systolic pressure was moderately increased with a PA pressure 45 mm of mercury.  Allergies    Allergen Reactions  . Penicillins     REACTION: Rash    Current Outpatient Prescriptions  Medication Sig Dispense Refill  . amLODipine (NORVASC) 10 MG tablet Take 10 mg by mouth daily.      . benazepril (LOTENSIN) 10 MG tablet Take 10 mg by mouth daily.      Marland Kitchen ALPRAZolam (XANAX) 0.25 MG tablet Take 0.25 mg by mouth at bedtime as needed.      Marland Kitchen aspirin 81 MG tablet Take 81 mg by mouth daily.        . Bimatoprost (LUMIGAN OP) Apply to eye at bedtime. 1 drop each eye       . brimonidine (ALPHAGAN) 0.15 % ophthalmic solution Place 1 drop into both eyes 2 (two) times daily.       . calcium-vitamin D (OSCAL WITH D) 500-200 MG-UNIT per tablet Take 1 tablet by mouth daily.        Tery Sanfilippo Calcium (STOOL SOFTENER PO) Take by mouth as needed.        . dorzolamide-timolol (COSOPT) 22.3-6.8 MG/ML ophthalmic solution 1 drop 2 (two) times daily.        Marland Kitchen gemfibrozil (LOPID) 600 MG tablet       . latanoprost (XALATAN) 0.005 % ophthalmic solution Place 1 drop into both eyes at bedtime.      Marland Kitchen loratadine (CLARITIN) 10 MG tablet Take 10 mg by mouth daily.      . meclizine (ANTIVERT) 25 MG tablet Take 25 mg by mouth 3 (three) times daily as needed.      . Multiple Vitamin (MULTI-VITAMIN DAILY PO) Take by mouth.        Marland Kitchen  Naproxen Sodium (ALEVE) 220 MG CAPS Take by mouth.        . Omega-3 Fatty Acids (FISH OIL PO) Take by mouth.        . Pramox-PE-Glycerin-Petrolatum (PREPARATION H RE) Place rectally as needed.        . sodium bicarbonate 650 MG tablet Take 650 mg by mouth 2 (two) times daily.      . traMADol (ULTRAM) 50 MG tablet Take 50 mg by mouth every 6 (six) hours as needed. Maximum dose= 8 tablets per day       . travoprost, benzalkonium, (TRAVATAN) 0.004 % ophthalmic solution 1 drop at bedtime.         No current facility-administered medications for this visit.    Past Medical History  Diagnosis Date  . Hyperlipidemia   . Hypertension   . Mitral regurgitation     sinus bradycardia  .  Breast pain     leftr  . Venous insufficiency   . Allergic rhinitis   . Seborrheic keratosis   . Breast mass     left  . Vertigo   . Carpal tunnel syndrome   . Cataract   . Glaucoma   . Constipation   . IBS (irritable bowel syndrome)   . Osteopenia   . Osteoarthritis   . Low back pain   . Breast cancer   . Anxiety   . Vitamin D deficiency     Past Surgical History  Procedure Laterality Date  . Appendectomy    . Cholecystectomy    . Breast lumpectomy    . Keloid      left ear  . Eye surgery      for bleed in left eye  . Cyst removed from left breast    . Spurs on back      WUJ:WJXBJY of systems complete and found to be negative unless listed above  PHYSICAL EXAM BP 147/65  Pulse 40  Ht 5\' 4"  (1.626 m)  Wt 142 lb (64.411 kg)  BMI 24.36 kg/m2  General: Well developed, well nourished, in no acute distress Head: Eyes PERRLA, No xanthomas.   Normal cephalic and atramatic  Lungs: Clear bilaterally to auscultation and percussion. Heart: HRRR S1 S2, 1/6 systolic murmur.  Pulses are 2+ & equal.            No carotid bruit. No JVD.  No abdominal bruits. No femoral bruits. Abdomen: Bowel sounds are positive, abdomen soft and non-tender without masses or                  Hernia's noted. Msk:  Back normal, normal gait. Normal strength and tone for age. Extremities: No clubbing, cyanosis or edema.  DP +1 Neuro: Alert and oriented X 3. Psych:  Good affect, responds appropriately  EKG: (From Dr. Evelene Croon office dated 03/14/2013) sinus bradycardia with sinus arrhythmia, heart rate of 53 beats per minute, no evidence of AV block.  ASSESSMENT AND PLAN

## 2013-04-01 NOTE — Progress Notes (Deleted)
Name: Jodi Peters    DOB: Mar 26, 1930  Age: 79 y.o.  MR#: [id number]       PCP:  Avon Gully, MD      Insurance: Payor: [employer] MEDICARE / Plan: AARP MEDICARE COMPLETE / Product Type: *No Product type* /   CC:    Chief Complaint  Patient presents with  . Hypertension    VS Filed Vitals:   04/01/13 1548 04/01/13 1549 04/01/13 1550  BP: 171/68 167/69 147/65  Pulse: 67 50 40  Height: 5\' 4"  (1.626 m)    Weight: 142 lb (64.411 kg)      Weights Current Weight  04/01/13 142 lb (64.411 kg)  05/08/12 146 lb 3.2 oz (66.316 kg)  05/10/11 148 lb 6.4 oz (67.314 kg)    Blood Pressure  BP Readings from Last 3 Encounters:  04/01/13 147/65  05/08/12 146/76  05/10/11 115/61     Admit date:  (Not on file) Last encounter with RMR:  Visit date not found   Allergy Penicillins  Current Outpatient Prescriptions  Medication Sig Dispense Refill  . amLODipine (NORVASC) 10 MG tablet Take 10 mg by mouth daily.      . benazepril (LOTENSIN) 10 MG tablet Take 10 mg by mouth daily.      Marland Kitchen ALPRAZolam (XANAX) 0.25 MG tablet Take 0.25 mg by mouth at bedtime as needed.      Marland Kitchen aspirin 81 MG tablet Take 81 mg by mouth daily.        . Bimatoprost (LUMIGAN OP) Apply to eye at bedtime. 1 drop each eye       . brimonidine (ALPHAGAN) 0.15 % ophthalmic solution Place 1 drop into both eyes 2 (two) times daily.       . calcium-vitamin D (OSCAL WITH D) 500-200 MG-UNIT per tablet Take 1 tablet by mouth daily.        Tery Sanfilippo Calcium (STOOL SOFTENER PO) Take by mouth as needed.        . dorzolamide-timolol (COSOPT) 22.3-6.8 MG/ML ophthalmic solution 1 drop 2 (two) times daily.        Marland Kitchen gemfibrozil (LOPID) 600 MG tablet       . latanoprost (XALATAN) 0.005 % ophthalmic solution Place 1 drop into both eyes at bedtime.      Marland Kitchen loratadine (CLARITIN) 10 MG tablet Take 10 mg by mouth daily.      . meclizine (ANTIVERT) 25 MG tablet Take 25 mg by mouth 3 (three) times daily as needed.      . Multiple Vitamin  (MULTI-VITAMIN DAILY PO) Take by mouth.        . Naproxen Sodium (ALEVE) 220 MG CAPS Take by mouth.        . Omega-3 Fatty Acids (FISH OIL PO) Take by mouth.        . Pramox-PE-Glycerin-Petrolatum (PREPARATION H RE) Place rectally as needed.        . sodium bicarbonate 650 MG tablet Take 650 mg by mouth 2 (two) times daily.      . traMADol (ULTRAM) 50 MG tablet Take 50 mg by mouth every 6 (six) hours as needed. Maximum dose= 8 tablets per day       . travoprost, benzalkonium, (TRAVATAN) 0.004 % ophthalmic solution 1 drop at bedtime.         No current facility-administered medications for this visit.    Discontinued Meds:    Medications Discontinued During This Encounter  Medication Reason  . amLODipine (NORVASC) 5 MG tablet Error  .  benazepril (LOTENSIN) 20 MG tablet Error  . Choline Fenofibrate (TRILIPIX) 135 MG capsule Error  . chlorthalidone (HYGROTON) 25 MG tablet Error  . HYDROcodone-acetaminophen (VICODIN) 5-500 MG per tablet Error  . mirtazapine (REMERON) 15 MG tablet Error    Patient Active Problem List   Diagnosis Date Noted  . HERPES ZOSTER 01/14/2010  . MITRAL REGURGITATION 01/14/2010  . CEREBROVASCULAR DISEASE 01/14/2010  . INSOMNIA 12/23/2008  . OSTEOPENIA 06/01/2008  . BRADYCARDIA 01/27/2008  . INSUFFICIENCY, VENOUS NOS 01/10/2007  . ALLERGIC RHINITIS, SEASONAL 10/10/2006  . SEBORRHEIC KERATOSIS 08/08/2006  . HYPERLIPIDEMIA 05/23/2006  . ANXIETY 05/23/2006  . CARPAL TUNNEL SYNDROME 05/23/2006  . GLAUCOMA NOS 05/23/2006  . CATARACT NOS 05/23/2006  . HYPERTENSION 05/23/2006  . CONSTIPATION 05/23/2006  . IBS 05/23/2006  . OSTEOARTHRITIS 05/23/2006  . LOW BACK PAIN 05/23/2006  . BREAST CANCER, HX OF 05/23/2006    LABS    Component Value Date/Time   NA 143 03/16/2010 1931   NA 143 09/30/2009 0017   NA 144 12/24/2008 2020   K 4.2 03/16/2010 1931   K 3.5 09/30/2009 0017   K 4.2 12/24/2008 2020   CL 106 03/16/2010 1931   CL 111 09/30/2009 0017   CL 107  12/24/2008 2020   CO2 25 03/16/2010 1931   CO2 26 09/30/2009 0017   CO2 25 12/24/2008 2020   GLUCOSE 130* 03/16/2010 1931   GLUCOSE 117* 09/30/2009 0017   GLUCOSE 94 12/24/2008 2020   BUN 34* 03/16/2010 1931   BUN 25* 02/22/2010 1320   BUN 21 09/30/2009 0017   CREATININE 1.33* 03/16/2010 1931   CREATININE 1.24* 02/22/2010 1320   CREATININE 1.32* 09/30/2009 0017   CALCIUM 10.5 03/16/2010 1931   CALCIUM 10.3 09/30/2009 0017   CALCIUM 10.2 12/24/2008 2020   GFRNONAA 42* 02/22/2010 1320   GFRNONAA 39* 09/30/2009 0017   GFRNONAA 47* 10/09/2007 1542   GFRAA  Value: 51        The eGFR has been calculated using the MDRD equation. This calculation has not been validated in all clinical situations. eGFR's persistently <60 mL/min signify possible Chronic Kidney Disease.* 02/22/2010 1320   GFRAA  Value: 47        The eGFR has been calculated using the MDRD equation. This calculation has not been validated in all clinical situations. eGFR's persistently <60 mL/min signify possible Chronic Kidney Disease.* 09/30/2009 0017   GFRAA  Value: 57        The eGFR has been calculated using the MDRD equation. This calculation has not been validated in all clinical* 10/09/2007 1542   CMP     Component Value Date/Time   NA 143 03/16/2010 1931   K 4.2 03/16/2010 1931   CL 106 03/16/2010 1931   CO2 25 03/16/2010 1931   GLUCOSE 130* 03/16/2010 1931   BUN 34* 03/16/2010 1931   CREATININE 1.33* 03/16/2010 1931   CALCIUM 10.5 03/16/2010 1931   PROT 7.2 03/16/2010 1931   ALBUMIN 4.5 03/16/2010 1931   AST 18 03/16/2010 1931   ALT 15 03/16/2010 1931   ALKPHOS 60 03/16/2010 1931   BILITOT 0.5 03/16/2010 1931   GFRNONAA 42* 02/22/2010 1320   GFRAA  Value: 51        The eGFR has been calculated using the MDRD equation. This calculation has not been validated in all clinical situations. eGFR's persistently <60 mL/min signify possible Chronic Kidney Disease.* 02/22/2010 1320       Component Value Date/Time   WBC 9.0 09/30/2009 0017  WBC 8.6  10/09/2007 1542   WBC 11.1* 07/09/2007 1056   HGB 12.1 09/30/2009 0017   HGB 12.7 01/27/2008 1332   HGB 12.3 10/09/2007 1542   HCT 34.6* 09/30/2009 0017   HCT 36.8 10/09/2007 1542   HCT 38.4 07/09/2007 1056   MCV 85.8 09/30/2009 0017   MCV 87.4 10/09/2007 1542   MCV 86.1 07/09/2007 1056    Lipid Panel     Component Value Date/Time   CHOL 192 01/17/2010 2043   TRIG 154* 01/17/2010 2043   HDL 47 01/17/2010 2043   CHOLHDL 4.1 Ratio 01/17/2010 2043   VLDL 31 01/17/2010 2043   LDLCALC 114* 01/17/2010 2043    ABG No results found for this basename: phart, pco2, pco2art, po2, po2art, hco3, tco2, acidbasedef, o2sat     Lab Results  Component Value Date   TSH 1.324 05/27/2007   BNP (last 3 results) No results found for this basename: PROBNP,  in the last 8760 hours Cardiac Panel (last 3 results) No results found for this basename: CKTOTAL, CKMB, TROPONINI, RELINDX,  in the last 72 hours  Iron/TIBC/Ferritin No results found for this basename: iron, tibc, ferritin     EKG Orders placed in visit on 03/14/13  . EKG 12-LEAD     Prior Assessment and Plan Problem List as of 04/01/2013     Cardiovascular and Mediastinum   MITRAL REGURGITATION   HYPERTENSION   Last Assessment & Plan   04/18/2011 Office Visit Written 04/18/2011 12:56 PM by Jodelle Gross, NP     She is essentially well controlled. Will make no changes at this time. BP range in the 140's systolic is preferred for her age group as this is tolerated better. She will continue the norvasc and benazepril at current doses.    BRADYCARDIA   Last Assessment & Plan   04/18/2011 Office Visit Written 04/18/2011 12:54 PM by Jodelle Gross, NP     HR rate is not extremely low on this office visit. She is asymptomatic without chest pain, shortness of breath or dizziness. Will continue to monitor this.  She is stable at present.    CEREBROVASCULAR DISEASE   INSUFFICIENCY, VENOUS NOS     Respiratory   ALLERGIC RHINITIS, SEASONAL      Digestive   CONSTIPATION   IBS     Nervous and Auditory   CARPAL TUNNEL SYNDROME     Musculoskeletal and Integument   SEBORRHEIC KERATOSIS   OSTEOARTHRITIS   OSTEOPENIA     Other   HERPES ZOSTER   HYPERLIPIDEMIA   ANXIETY   GLAUCOMA NOS   CATARACT NOS   LOW BACK PAIN   INSOMNIA   BREAST CANCER, HX OF       Imaging: No results found.

## 2013-04-01 NOTE — Patient Instructions (Addendum)
Your physician recommends that you schedule a follow-up appointment in: After Holter with Dr Purvis Sheffield  Your physician has recommended that you wear an event monitor. Event monitors are medical devices that record the heart's electrical activity. Doctors most often [place] these monitors to diagnose arrhythmias. Arrhythmias are problems with the speed or rhythm of the heartbeat. The monitor is a small, portable device. You can wear one while you do your normal daily activities. This is usually used to diagnose what is causing palpitations/syncope (passing out). 10 days

## 2013-04-01 NOTE — Assessment & Plan Note (Signed)
The patient had orthostatic blood pressures completed and was found to be bradycardic with position change. Lying heart rate was 67, sitting heart rate 50, standing heart rate 40 beats per minute. The patient had some dizziness associated with position change. I will plan a Card[employer] monitor for the next 10 days. This will evaluate her heart rate throughout the day, with advisement to right symptoms down in a law which is attached. She is advised not to take positions quickly. She is to take her time getting up or lying or sitting position. She verbalizes understanding.

## 2013-04-01 NOTE — Assessment & Plan Note (Signed)
Blood pressure is elevated initially. On triage however her blood pressure was 147/65. One static blood pressures were completed in the office. Lying 171/68, sitting 167/69, standing 147/65. As stated a low her heart rate did drop from 67-40 from lying to standing position.  I have reviewed this with Dr. Beulah Gandy, on site, to evaluate need to adjust current medication regimen which includes amlodipine and benazepril. Dr. Felecia Shelling stop her chlorthalidone on recent office visit. It is his advisement that we keep her on her current medication regimen and follow her cardiac monitor on current medications. He will see her in the office on followup in approximately 2 weeks to one month.

## 2013-04-15 ENCOUNTER — Other Ambulatory Visit: Payer: Self-pay | Admitting: *Deleted

## 2013-04-15 DIAGNOSIS — I498 Other specified cardiac arrhythmias: Secondary | ICD-10-CM

## 2013-04-24 ENCOUNTER — Ambulatory Visit (INDEPENDENT_AMBULATORY_CARE_PROVIDER_SITE_OTHER): Payer: Medicare Other | Admitting: Cardiovascular Disease

## 2013-04-24 ENCOUNTER — Encounter: Payer: Self-pay | Admitting: Cardiovascular Disease

## 2013-04-24 VITALS — BP 133/67 | HR 61 | Ht 64.5 in | Wt 145.0 lb

## 2013-04-24 DIAGNOSIS — I10 Essential (primary) hypertension: Secondary | ICD-10-CM

## 2013-04-24 DIAGNOSIS — R42 Dizziness and giddiness: Secondary | ICD-10-CM

## 2013-04-24 DIAGNOSIS — I498 Other specified cardiac arrhythmias: Secondary | ICD-10-CM

## 2013-04-24 DIAGNOSIS — I38 Endocarditis, valve unspecified: Secondary | ICD-10-CM

## 2013-04-24 DIAGNOSIS — R001 Bradycardia, unspecified: Secondary | ICD-10-CM

## 2013-04-24 DIAGNOSIS — R0602 Shortness of breath: Secondary | ICD-10-CM

## 2013-04-24 DIAGNOSIS — Q248 Other specified congenital malformations of heart: Secondary | ICD-10-CM

## 2013-04-24 DIAGNOSIS — I951 Orthostatic hypotension: Secondary | ICD-10-CM

## 2013-04-24 MED ORDER — AMLODIPINE BESYLATE 5 MG PO TABS: 5.0000 mg | ORAL_TABLET | Freq: Every day | ORAL | Status: DC

## 2013-04-24 NOTE — Patient Instructions (Addendum)
  Your physician recommends that you schedule a follow-up appointment in:  3 months  Your physician has recommended you make the following change in your medication:  REDUCE amlodipine  To 5 mg daily You may break for current pill in half until finished with them,then get new prescription

## 2013-04-24 NOTE — Progress Notes (Signed)
Patient ID: Jodi Peters, female   DOB: 1930-02-15, 79 y.o.   MRN: [id number]      SUBJECTIVE: The patient is an 79 year old female with a history of hypertension, bradycardia with orthostatic hypotension, valvular regurgitation, chronic kidney disease, and systolic anterior motion of the anterior mitral leaflet leading to a dynamic peak gradient of 11 mm mercury. She had been having some mild bradycardia with a heart rate in the 50 beat per minute range with some orthostatic symptoms including presyncope. For this reason she wore a heart monitor. Her monitor showed sinus bradycardia with sinus arrhythmia with a heart rate of 50-60 beats per minute with artifact noted.  She experiences some mild shortness of breath when she sweeps the floor or vacuums. She denies any chest pain and palpitations. When she stands up after eating breakfast she may feel some lightheadedness and dizziness but this subsides after some time. She occasionally experiences it when walking across the room. She says she rarely experiences these symptoms in the afternoon. She's had these problems with vertigo for a long time. It appears she was seen by an otolaryngologist several years ago and was told she had no problems. She does not like taking her Antivert as it doesn't make her feel well.   Allergies  Allergen Reactions  . Penicillins     REACTION: Rash    Current Outpatient Prescriptions  Medication Sig Dispense Refill  . ALPRAZolam (XANAX) 0.25 MG tablet Take 0.25 mg by mouth 2 (two) times daily as needed.       Marland Kitchen amLODipine (NORVASC) 10 MG tablet Take 10 mg by mouth daily.      Marland Kitchen aspirin 81 MG tablet Take 81 mg by mouth daily.        . benazepril (LOTENSIN) 10 MG tablet Take 10 mg by mouth daily.      . Bimatoprost (LUMIGAN OP) Apply to eye at bedtime. 1 drop each eye       . brimonidine (ALPHAGAN) 0.15 % ophthalmic solution Place 1 drop into both eyes 2 (two) times daily.       . calcium-vitamin D (OSCAL WITH D)  500-200 MG-UNIT per tablet Take 1 tablet by mouth daily.        Tery Sanfilippo Calcium (STOOL SOFTENER PO) Take by mouth as needed.        . dorzolamide-timolol (COSOPT) 22.3-6.8 MG/ML ophthalmic solution 1 drop 2 (two) times daily.        Marland Kitchen gemfibrozil (LOPID) 600 MG tablet Take 600 mg by mouth 2 (two) times daily before a meal.       . latanoprost (XALATAN) 0.005 % ophthalmic solution Place 1 drop into both eyes at bedtime.      . meclizine (ANTIVERT) 25 MG tablet Take 25 mg by mouth 3 (three) times daily as needed.      . Multiple Vitamin (MULTI-VITAMIN DAILY PO) Take by mouth.        . Naproxen Sodium (ALEVE) 220 MG CAPS Take by mouth.        . Pramox-PE-Glycerin-Petrolatum (PREPARATION H RE) Place rectally as needed.        . traMADol (ULTRAM) 50 MG tablet Take 50 mg by mouth every 6 (six) hours as needed. Maximum dose= 8 tablets per day       . travoprost, benzalkonium, (TRAVATAN) 0.004 % ophthalmic solution 1 drop at bedtime.         No current facility-administered medications for this visit.    Past Medical History  Diagnosis Date  . Hyperlipidemia   . Hypertension   . Mitral regurgitation     sinus bradycardia  . Breast pain     leftr  . Venous insufficiency   . Allergic rhinitis   . Seborrheic keratosis   . Breast mass     left  . Vertigo   . Carpal tunnel syndrome   . Cataract   . Glaucoma   . Constipation   . IBS (irritable bowel syndrome)   . Osteopenia   . Osteoarthritis   . Low back pain   . Breast cancer   . Anxiety   . Vitamin D deficiency     Past Surgical History  Procedure Laterality Date  . Appendectomy    . Cholecystectomy    . Breast lumpectomy    . Keloid      left ear  . Eye surgery      for bleed in left eye  . Cyst removed from left breast    . Spurs on back      History   Social History  . Marital Status: Divorced    Spouse Name: N/A    Number of Children: N/A  . Years of Education: N/A   Occupational History  . Not on file.    Social History Main Topics  . Smoking status: Never Smoker   . Smokeless tobacco: Never Used  . Alcohol Use: No  . Drug Use: No  . Sexual Activity:    Other Topics Concern  . Not on file   Social History Narrative  . No narrative on file     Filed Vitals:   04/24/13 1039  BP: 133/67  Pulse: 61  Height: 5' 4.5" (1.638 m)  Weight: 145 lb (65.772 kg)    PHYSICAL EXAM General: NAD Neck: No JVD, no thyromegaly or thyroid nodule.  Lungs: Clear to auscultation bilaterally with normal respiratory effort. CV: Nondisplaced PMI.  Heart regular rhythm with bradycardia, HR 48-50 bpm, normal S1/S2, no S3/S4, II/VI outflow tract murmur.  No peripheral edema.  No carotid bruit.  Normal pedal pulses.  Abdomen: Soft, nontender, no hepatosplenomegaly, no distention.  Neurologic: Alert and oriented x 3.  Psych: Normal affect. Extremities: No clubbing or cyanosis.   ECG: reviewed and available in electronic records.  ECHO (03-14-13): - Left ventricle: The cavity size was normal. There is focal basal hypertrophy. There is evidence of systolic anterior motion of the anterior mitral valve leaflet causing a mild dynamic obstructive gradient (peak velocity 1.6 m/s, peak gradient 11 mmHg). Systolic function was vigorous. The estimated ejection fraction was in the range of 65% to 70%. Wall motion was normal; there were no regional wall motion abnormalities. Indeterminate diastolic function. - Aortic valve: Mildly calcified annulus. Trileaflet; mildly thickened leaflets. - Mitral valve: Mildly calcified annulus. Mildly thickened leaflets . Mild regurgitation. - Left atrium: The atrium was severely dilated. - Right atrium: The atrium was mildly dilated. - Tricuspid valve: Moderate regurgitation. - Pulmonary arteries: Systolic pressure was moderately increased. PA peak pressure: 45mm Hg (S).     ASSESSMENT AND PLAN: 1. Dizziness in the setting of orthostatic hypotension and bradycardia:  Her blood pressure is well controlled and I suspect that decreasing her antihypertensive medication regimen may allow her to experience the symptoms on a less frequent basis. I will reduce her amlodipine to 5 mg daily. She may have some occasional symptomatic sinus bradycardia but I do not feel a pacemaker is indicated at this time. I think this should  simply be monitored for now. She is in agreement with this plan. 2. HTN: well controlled, but may be too well controlled with respect to her symptoms. Given her age and as per recent guidelines, having her SBP run in the low 140 mmHg range would be tolerable; especially if this would help to alleviate her symptoms. For this reason, I am reducing her amlodipine 5 mg daily. 3. Left ventricular outflow tract gradient with systolic anterior motion of the anterior mitral leaflet: her peak gradient is only 11 mmHg and she may only experience mild shortness of breath with exertion. I am not able to use a beta blocker due to her resting bradycardia. This will simply be monitored. 4. Valvular heart disease: She has mild mitral and moderate tricuspid regurgitation. She has no evidence of heart failure. We'll continue to monitor this on a routine basis.  Dispo: f/u in 3 months.   Prentice Docker, M.D., F.A.C.C.

## 2013-05-01 NOTE — Progress Notes (Signed)
[facility], MD [address] [place] [address]  BREAST CANCER, HX OF  CURRENT THERAPY: Surveillance per NCCN guidelines  INTERVAL HISTORY: Jodi Peters 79 y.o. 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 Her cancer was ER positive at 80%, PR +30%, HER-2/neu was 0, Ki-67 marker was less than 10%. Lumpectomy was on 04/12/1999 followed by radiation therapy. She also had negative margins I should add. She was observed after surgery and radiation and still remains disease free.  She had mammogram on 06/11/2012 at which time she had a BIRADS 1 examination.  She will be due for her next annual screening mammogram in December 2014 (next month).  We reviewed the NCCN guidelines. NCCN guidelines recommends the following surveillance for invasive breast cancer:  A. History and Physical exam every 4-6 months for 5 years and then every 12 months.  B. Mammography every 12 months  C. Women on Tamoxifen: annual gynecologic assessment every 12 months if uterus is present.  D. Women on aromatase inhibitor or who experience ovarian failure secondary to treatment should have monitoring of bone health with a bone mineral density determination at baseline and periodically thereafter.  E. Assess and encourage adherence to adjuvant endocrine therapy.  F. Evidence suggests that active lifestyle and achieving and maintaining an ideal body weight (20-25 BMI) may lead to optimal breast cancer outcomes.  "I do not know why I keep having to come here every year."  I agreed with the patient. She is 14 years out from diagnosis.  I have encouraged her to undergo routine annual Mammography.  Additionally, she follows-up with Dr. Felecia Peters, PCP, every 3 months.  I recommend he perform a breast exam every 6-12 months.  She wishes to be released from the clinic which is  absolutely reasonable.  She denies any complaints oncologically, she ROS questioning is negative.   Past Medical History  Diagnosis Date  . Hyperlipidemia   . Hypertension   . Mitral regurgitation     sinus bradycardia  . Breast pain     leftr  . Venous insufficiency   . Allergic rhinitis   . Seborrheic keratosis   . Breast mass     left  . Vertigo   . Carpal tunnel syndrome   . Cataract   . Glaucoma   . Constipation   . IBS (irritable bowel syndrome)   . Osteopenia   . Osteoarthritis   . Low back pain   . Breast cancer   . Anxiety   . Vitamin D deficiency   . Bradycardia     has HERPES ZOSTER; HYPERLIPIDEMIA; ANXIETY; CARPAL TUNNEL SYNDROME; GLAUCOMA NOS; CATARACT NOS; MITRAL REGURGITATION; HYPERTENSION; BRADYCARDIA; CEREBROVASCULAR DISEASE; INSUFFICIENCY, VENOUS NOS; ALLERGIC RHINITIS, SEASONAL; CONSTIPATION; IBS; SEBORRHEIC KERATOSIS; OSTEOARTHRITIS; LOW BACK PAIN; OSTEOPENIA; INSOMNIA; and BREAST CANCER, HX OF on her problem list.     is allergic to penicillins.  [name] had no medications administered during this visit.  Past Surgical History  Procedure Laterality Date  . Appendectomy    . Cholecystectomy    . Breast lumpectomy    . Keloid      left ear  . Eye surgery      for bleed in left eye  . Cyst removed from left breast    . Spurs on back      Denies any headaches, dizziness, double vision,  fevers, chills, night sweats, nausea, vomiting, diarrhea, constipation, chest pain, heart palpitations, shortness of breath, blood in stool, black tarry stool, urinary pain, urinary burning, urinary frequency, hematuria.   PHYSICAL EXAMINATION  ECOG PERFORMANCE STATUS: 0 - Asymptomatic  Filed Vitals:   05/02/13 1137  BP: 160/55  Pulse: 45  Temp: 98.1 F (36.7 C)  Resp: 16    GENERAL:alert, no distress, well nourished, well developed, comfortable, cooperative and smiling SKIN: skin color, texture, turgor are normal, no rashes or significant  lesions HEAD: Normocephalic, No masses, lesions, tenderness or abnormalities EYES: normal, PERRLA, EOMI, Conjunctiva are pink and non-injected EARS: left ear is deformed externally OROPHARYNX:mucous membranes are moist  NECK: supple, no adenopathy, thyroid normal size, non-tender, without nodularity, no stridor, non-tender, trachea midline LYMPH:  no palpable lymphadenopathy, no hepatosplenomegaly BREAST:breasts appear normal, no suspicious masses, no skin or nipple changes or axillary nodes LUNGS: clear to auscultation and percussion HEART: regular rate & rhythm, no murmurs and no gallops ABDOMEN:abdomen soft, non-tender and normal bowel sounds BACK: Back symmetric, no curvature., No CVA tenderness EXTREMITIES:less then 2 second capillary refill, no joint deformities, effusion, or inflammation, no edema, no skin discoloration, no clubbing, no cyanosis  NEURO: alert & oriented x 3 with fluent speech, no focal motor/sensory deficits, gait normal   RADIOGRAPHIC STUDIES:  06/11/2012  *RADIOLOGY REPORT*  Clinical Data: Screening.  DIGITAL BILATERAL SCREENING MAMMOGRAM WITH CAD  Comparison: Previous exams.  FINDINGS:  ACR Breast Density Category 2: There is a scattered fibroglandular  pattern.  No suspicious masses, architectural distortion, or calcifications  are present.  Images were processed with CAD.  IMPRESSION:  No mammographic evidence of malignancy.  A result letter of this screening mammogram will be mailed directly  to the patient.  RECOMMENDATION:  Screening mammogram in one year. (Code:SM-B-01Y)  BI-RADS CATEGORY 1: Negative.  Original Report Authenticated By: Rolla Plate, M.D.     ASSESSMENT:  1. 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 Her cancer was ER positive at 80%, PR +30%, HER-2/neu was 0, Ki-67 marker was less than 10%. Lumpectomy was on 04/12/1999 followed by  radiation therapy. She also had negative margins I should add. She was observed after surgery and radiation and still remains disease free. 2. Severe spinal stenosis  3. Chronic keloid formation in multiple areas  4. Lumbar laminectomy x2  5. Hypertension  6. Glaucoma  7. DJD  8. Cholecystectomy November 2005  9. TAH with unilateral salpingo-oophorectomy for early uterine cancer 1963 10. Left breast lumpectomy negative for malignancy but showing ductal hyperplasia.  Patient Active Problem List   Diagnosis Date Noted  . HERPES ZOSTER 01/14/2010  . MITRAL REGURGITATION 01/14/2010  . CEREBROVASCULAR DISEASE 01/14/2010  . INSOMNIA 12/23/2008  . OSTEOPENIA 06/01/2008  . BRADYCARDIA 01/27/2008  . INSUFFICIENCY, VENOUS NOS 01/10/2007  . ALLERGIC RHINITIS, SEASONAL 10/10/2006  . SEBORRHEIC KERATOSIS 08/08/2006  . HYPERLIPIDEMIA 05/23/2006  . ANXIETY 05/23/2006  . CARPAL TUNNEL SYNDROME 05/23/2006  . GLAUCOMA NOS 05/23/2006  . CATARACT NOS 05/23/2006  . HYPERTENSION 05/23/2006  . CONSTIPATION 05/23/2006  . IBS 05/23/2006  . OSTEOARTHRITIS 05/23/2006  . LOW BACK PAIN 05/23/2006  . BREAST CANCER, HX OF 05/23/2006     PLAN:  1. I personally reviewed and went over laboratory results with the patient. 2. Next screening mammogram is due next month, December 2014. 3. Will release the patient from the clinic to her PCP.  Recommend annual screening mammography  and breast exam annually.    THERAPY PLAN:  NCCN guidelines recommends the following surveillance for invasive breast cancer:  A. History and Physical exam every 4-6 months for 5 years and then every 12 months.  B. Mammography every 12 months  C. Women on Tamoxifen: annual gynecologic assessment every 12 months if uterus is present.  D. Women on aromatase inhibitor or who experience ovarian failure secondary to treatment should have monitoring of bone health with a bone mineral density determination at baseline and periodically  thereafter.  E. Assess and encourage adherence to adjuvant endocrine therapy.  F. Evidence suggests that active lifestyle and achieving and maintaining an ideal body weight (20-25 BMI) may lead to optimal breast cancer outcomes.   All questions were answered. The patient knows to call the clinic with any problems, questions or concerns. We can certainly see the patient much sooner if necessary.  Patient and plan discussed with Dr. Alla German and he is in agreement with the aforementioned.   KEFALAS,THOMAS

## 2013-05-02 ENCOUNTER — Ambulatory Visit (HOSPITAL_COMMUNITY): Payer: Medicare Other | Admitting: Oncology

## 2013-05-02 ENCOUNTER — Encounter (HOSPITAL_COMMUNITY): Payer: Medicare Other | Attending: Oncology | Admitting: Oncology

## 2013-05-02 ENCOUNTER — Encounter (HOSPITAL_COMMUNITY): Payer: Self-pay | Admitting: Oncology

## 2013-05-02 VITALS — BP 160/55 | HR 45 | Temp 98.1°F | Resp 16 | Wt 144.1 lb

## 2013-05-02 DIAGNOSIS — Z853 Personal history of malignant neoplasm of breast: Secondary | ICD-10-CM

## 2013-05-02 NOTE — Patient Instructions (Signed)
[facility] Discharge Instructions  RECOMMENDATIONS MADE BY THE CONSULTANT AND ANY TEST RESULTS WILL BE SENT TO YOUR REFERRING PHYSICIAN.  EXAM FINDINGS BY THE PHYSICIAN TODAY AND SIGNS OR SYMPTOMS TO REPORT TO CLINIC OR PRIMARY PHYSICIAN: Exam and findings as discussed by Dellis Anes, PA-C. Report any new lumps, bone pain, shortness of breath or other symptoms.  MEDICATIONS PRESCRIBED:  none  INSTRUCTIONS/FOLLOW-UP: No follow-up needed.  Thank you for choosing [facility] to provide your oncology and hematology care.  To afford each patient quality time with our providers, please arrive at least 15 minutes before your scheduled appointment time.  With your help, our goal is to use those 15 minutes to complete the necessary work-up to ensure our physicians have the information they need to help with your evaluation and healthcare recommendations.    Effective January 1st, 2014, we ask that you re-schedule your appointment with our physicians should you arrive 10 or more minutes late for your appointment.  We strive to give you quality time with our providers, and arriving late affects you and other patients whose appointments are after yours.    Again, thank you for choosing [facility].  Our hope is that these requests will decrease the amount of time that you wait before being seen by our physicians.       _____________________________________________________________  Should you have questions after your visit to [facility], please contact our office at [phone] between the hours of 8:30 a.m. and 5:00 p.m.  Voicemails left after 4:30 p.m. will not be returned until the following business day.  For prescription refill requests, have your pharmacy contact our office with your prescription refill request.

## 2013-05-07 ENCOUNTER — Ambulatory Visit (HOSPITAL_COMMUNITY): Payer: Medicare Other | Admitting: Oncology

## 2013-05-12 ENCOUNTER — Other Ambulatory Visit (HOSPITAL_COMMUNITY): Payer: Self-pay | Admitting: Internal Medicine

## 2013-05-12 DIAGNOSIS — Z139 Encounter for screening, unspecified: Secondary | ICD-10-CM

## 2013-06-13 ENCOUNTER — Ambulatory Visit (HOSPITAL_COMMUNITY)
Admission: RE | Admit: 2013-06-13 | Discharge: 2013-06-13 | Disposition: A | Discharge status: Home / Self Care | Payer: Medicare Other | Source: Ambulatory Visit | Attending: Internal Medicine | Admitting: Internal Medicine

## 2013-06-13 DIAGNOSIS — Z139 Encounter for screening, unspecified: Secondary | ICD-10-CM

## 2013-06-13 DIAGNOSIS — Z1231 Encounter for screening mammogram for malignant neoplasm of breast: Secondary | ICD-10-CM | POA: Insufficient documentation

## 2013-07-25 ENCOUNTER — Encounter: Payer: Self-pay | Admitting: Cardiovascular Disease

## 2013-07-25 ENCOUNTER — Ambulatory Visit (INDEPENDENT_AMBULATORY_CARE_PROVIDER_SITE_OTHER): Payer: Medicare Other | Admitting: Cardiovascular Disease

## 2013-07-25 VITALS — BP 154/66 | HR 59 | Ht 64.0 in | Wt 143.0 lb

## 2013-07-25 DIAGNOSIS — I951 Orthostatic hypotension: Secondary | ICD-10-CM

## 2013-07-25 DIAGNOSIS — R42 Dizziness and giddiness: Secondary | ICD-10-CM

## 2013-07-25 DIAGNOSIS — I38 Endocarditis, valve unspecified: Secondary | ICD-10-CM

## 2013-07-25 DIAGNOSIS — R001 Bradycardia, unspecified: Secondary | ICD-10-CM

## 2013-07-25 DIAGNOSIS — R0602 Shortness of breath: Secondary | ICD-10-CM

## 2013-07-25 DIAGNOSIS — I498 Other specified cardiac arrhythmias: Secondary | ICD-10-CM

## 2013-07-25 DIAGNOSIS — I10 Essential (primary) hypertension: Secondary | ICD-10-CM

## 2013-07-25 DIAGNOSIS — Q248 Other specified congenital malformations of heart: Secondary | ICD-10-CM

## 2013-07-25 NOTE — Progress Notes (Signed)
Patient ID: Jodi Peters, female   DOB: April 12, 1930, 80 y.o.   MRN: [id number]      SUBJECTIVE: The patient is an 80 year old female with a history of hypertension, bradycardia with orthostatic hypotension, dizziness, valvular regurgitation, chronic kidney disease, and systolic anterior motion of the anterior mitral leaflet leading to a dynamic peak gradient of 11 mm mercury. She feels like she has been doing well. She continues to experience dizziness each morning about half an hour after breakfast and lasting until noon. It is relieved with either sitting down or taking a half-tablet of meclizine. She denies falls and syncope. She denies chest pain. She may get a mild degree of peri-ankle swelling if she has been standing for prolonged periods of time. She may get some mild shortness of breath if she is sweeping. She has a BP monitor at home and recorded the values, but does not remember what they are. After decreasing her amlodipine at her last visit, she thinks she may feel "a little better".     Allergies  Allergen Reactions  . Penicillins     REACTION: Rash    Current Outpatient Prescriptions  Medication Sig Dispense Refill  . ALPRAZolam (XANAX) 0.25 MG tablet Take 0.25 mg by mouth 2 (two) times daily as needed.       Marland Kitchen amLODipine (NORVASC) 5 MG tablet Take 1 tablet (5 mg total) by mouth daily.  180 tablet  3  . aspirin 81 MG tablet Take 81 mg by mouth daily.        . benazepril (LOTENSIN) 10 MG tablet Take 10 mg by mouth daily.      . Bimatoprost (LUMIGAN OP) Apply to eye at bedtime. 1 drop each eye       . brimonidine (ALPHAGAN) 0.15 % ophthalmic solution Place 1 drop into both eyes 2 (two) times daily.       . Calcium Carb-Cholecalciferol (CALCIUM 600 + D PO) Take 1 capsule by mouth daily.      . diphenhydrAMINE (BENADRYL) 25 mg capsule Take 25 mg by mouth at bedtime as needed.      Mariane Baumgarten Calcium (STOOL SOFTENER PO) Take by mouth as needed.        . dorzolamide-timolol  (COSOPT) 22.3-6.8 MG/ML ophthalmic solution 1 drop 2 (two) times daily.        Marland Kitchen gemfibrozil (LOPID) 600 MG tablet Take 600 mg by mouth 2 (two) times daily before a meal.       . latanoprost (XALATAN) 0.005 % ophthalmic solution Place 1 drop into both eyes at bedtime.      . meclizine (ANTIVERT) 25 MG tablet Take 25 mg by mouth 3 (three) times daily as needed.      . Multiple Vitamin (MULTI-VITAMIN DAILY PO) Take by mouth.        . Naproxen Sodium (ALEVE) 220 MG CAPS Take by mouth.        [name] Glycol-Propyl Glycol (SYSTANE OP) Apply 1 drop to eye as needed.      . Pramox-PE-Glycerin-Petrolatum (PREPARATION H RE) Place rectally as needed.        . traMADol (ULTRAM) 50 MG tablet Take 50 mg by mouth every 6 (six) hours as needed. Maximum dose= 8 tablets per day       . travoprost, benzalkonium, (TRAVATAN) 0.004 % ophthalmic solution 1 drop at bedtime.         No current facility-administered medications for this visit.    Past Medical History  Diagnosis Date  .  Hyperlipidemia   . Hypertension   . Mitral regurgitation     sinus bradycardia  . Breast pain     leftr  . Venous insufficiency   . Allergic rhinitis   . Seborrheic keratosis   . Breast mass     left  . Vertigo   . Carpal tunnel syndrome   . Cataract   . Glaucoma   . Constipation   . IBS (irritable bowel syndrome)   . Osteopenia   . Osteoarthritis   . Low back pain   . Breast cancer   . Anxiety   . Vitamin D deficiency   . Bradycardia     Past Surgical History  Procedure Laterality Date  . Appendectomy    . Cholecystectomy    . Breast lumpectomy    . Keloid      left ear  . Eye surgery      for bleed in left eye  . Cyst removed from left breast    . Spurs on back      History   Social History  . Marital Status: Divorced    Spouse Name: N/A    Number of Children: N/A  . Years of Education: N/A   Occupational History  . Not on file.   Social History Main Topics  . Smoking status: Never  Smoker   . Smokeless tobacco: Never Used  . Alcohol Use: No  . Drug Use: No  . Sexual Activity:    Other Topics Concern  . Not on file   Social History Narrative  . No narrative on file    BP 154/66 Pulse 59    PHYSICAL EXAM General: NAD  Neck: No JVD, no thyromegaly or thyroid nodule.  Lungs: Clear to auscultation bilaterally with normal respiratory effort.  CV: Nondisplaced PMI. Heart regular rhythm, normal S1/S2, no S3/S4, II/VI outflow tract murmur. No peripheral edema. No carotid bruit. Normal pedal pulses.  Abdomen: Soft, nontender, no hepatosplenomegaly, no distention.  Neurologic: Alert and oriented x 3.  Psych: Normal affect.  Extremities: No clubbing or cyanosis.   ECG: reviewed and available in electronic records.  ECHO (03-14-13):  - Left ventricle: The cavity size was normal. There is focal basal hypertrophy. There is evidence of systolic anterior motion of the anterior mitral valve leaflet causing a mild dynamic obstructive gradient (peak velocity 1.6 m/s, peak gradient 11 mmHg). Systolic function was vigorous. The estimated ejection fraction was in the range of 65% to 70%. Wall motion was normal; there were no regional wall motion abnormalities. Indeterminate diastolic function. - Aortic valve: Mildly calcified annulus. Trileaflet; mildly thickened leaflets. - Mitral valve: Mildly calcified annulus. Mildly thickened leaflets . Mild regurgitation. - Left atrium: The atrium was severely dilated. - Right atrium: The atrium was mildly dilated. - Tricuspid valve: Moderate regurgitation. - Pulmonary arteries: Systolic pressure was moderately increased. PA peak pressure: 42mm Hg (S).  ASSESSMENT AND PLAN:  1. Dizziness in the setting of orthostatic hypotension and bradycardia: Her blood pressure is mildly elevated today. She may have some occasional symptomatic sinus bradycardia but I do not feel a pacemaker is indicated at this time. I think this should simply  be monitored for now. She is in agreement with this plan. Most episodes are relieved with sitting down or taking meclizine (half-tablet). 2. HTN: mildly elevated today. Given her age and as per recent guidelines, having her SBP run in the 140-150 mmHg range would be tolerable. I have asked the patient to check blood pressure  readings 4-5 times per week, at different times throughout the day, in order to get a better approximation of mean BP values. These results will be provided to me at the end of that period so that I can determine if antihypertensive medication titration is indicated. 3. Left ventricular outflow tract gradient with systolic anterior motion of the anterior mitral leaflet: her peak gradient is only 11 mmHg and she may only experience mild shortness of breath with exertion. I am not able to use a beta blocker due to her resting bradycardia. This will simply be monitored.  4. Valvular heart disease: She has mild mitral and moderate tricuspid regurgitation. She has no evidence of heart failure. We'll continue to monitor this on a routine basis.   Dispo: f/u in 6 months.    Kate Sable, M.D., F.A.C.C.

## 2013-07-25 NOTE — Patient Instructions (Signed)
Your physician recommends that you schedule a follow-up appointment in: 6 months  Your physician recommends that you continue on your current medications as directed. Please refer to the Current Medication list given to you today.   Please record and bring BP readings to office as MD directed in the next few weeks

## 2014-02-18 ENCOUNTER — Encounter (HOSPITAL_COMMUNITY): Payer: Self-pay | Admitting: Emergency Medicine

## 2014-02-18 ENCOUNTER — Emergency Department (HOSPITAL_COMMUNITY)
Admission: EM | Admit: 2014-02-18 | Discharge: 2014-02-18 | Disposition: A | Discharge status: Home / Self Care | Payer: Medicare Other | Attending: Emergency Medicine | Admitting: Emergency Medicine

## 2014-02-18 DIAGNOSIS — R5381 Other malaise: Secondary | ICD-10-CM | POA: Insufficient documentation

## 2014-02-18 DIAGNOSIS — I10 Essential (primary) hypertension: Secondary | ICD-10-CM | POA: Insufficient documentation

## 2014-02-18 DIAGNOSIS — Z880 Allergy status to penicillin: Secondary | ICD-10-CM | POA: Diagnosis not present

## 2014-02-18 DIAGNOSIS — Z872 Personal history of diseases of the skin and subcutaneous tissue: Secondary | ICD-10-CM | POA: Diagnosis not present

## 2014-02-18 DIAGNOSIS — Z8719 Personal history of other diseases of the digestive system: Secondary | ICD-10-CM | POA: Insufficient documentation

## 2014-02-18 DIAGNOSIS — R5383 Other fatigue: Secondary | ICD-10-CM

## 2014-02-18 DIAGNOSIS — Z853 Personal history of malignant neoplasm of breast: Secondary | ICD-10-CM | POA: Insufficient documentation

## 2014-02-18 DIAGNOSIS — E785 Hyperlipidemia, unspecified: Secondary | ICD-10-CM | POA: Insufficient documentation

## 2014-02-18 DIAGNOSIS — R42 Dizziness and giddiness: Secondary | ICD-10-CM | POA: Insufficient documentation

## 2014-02-18 DIAGNOSIS — Z7982 Long term (current) use of aspirin: Secondary | ICD-10-CM | POA: Insufficient documentation

## 2014-02-18 DIAGNOSIS — F411 Generalized anxiety disorder: Secondary | ICD-10-CM | POA: Insufficient documentation

## 2014-02-18 DIAGNOSIS — Z79899 Other long term (current) drug therapy: Secondary | ICD-10-CM | POA: Insufficient documentation

## 2014-02-18 DIAGNOSIS — M1990 Unspecified osteoarthritis, unspecified site: Secondary | ICD-10-CM | POA: Diagnosis not present

## 2014-02-18 DIAGNOSIS — Z8742 Personal history of other diseases of the female genital tract: Secondary | ICD-10-CM | POA: Diagnosis not present

## 2014-02-18 DIAGNOSIS — Z8709 Personal history of other diseases of the respiratory system: Secondary | ICD-10-CM | POA: Diagnosis not present

## 2014-02-18 DIAGNOSIS — Z8669 Personal history of other diseases of the nervous system and sense organs: Secondary | ICD-10-CM | POA: Insufficient documentation

## 2014-02-18 LAB — I-STAT CHEM 8, ED
BUN: 19 mg/dL (ref 6–23)
CALCIUM ION: 1.29 mmol/L (ref 1.13–1.30)
CHLORIDE: 117 meq/L — AB (ref 96–112)
CREATININE: 0.9 mg/dL (ref 0.50–1.10)
GLUCOSE: 98 mg/dL (ref 70–99)
HEMATOCRIT: 38 % (ref 36.0–46.0)
Hemoglobin: 12.9 g/dL (ref 12.0–15.0)
Potassium: 3.6 mEq/L — ABNORMAL LOW (ref 3.7–5.3)
Sodium: 142 mEq/L (ref 137–147)
TCO2: 24 mmol/L (ref 0–100)

## 2014-02-18 MED ORDER — BENAZEPRIL HCL 10 MG PO TABS
10.0000 mg | ORAL_TABLET | Freq: Once | ORAL | Status: AC
  Administered 2014-02-18: 10 mg via ORAL
  Filled 2014-02-18: qty 1

## 2014-02-18 MED ORDER — BENAZEPRIL HCL 5 MG PO TABS
ORAL_TABLET | ORAL | Status: AC
  Filled 2014-02-18: qty 2

## 2014-02-18 NOTE — ED Provider Notes (Signed)
CSN: [id number]     Arrival date & time 02/18/14  2032 History  This chart was scribed for Sharyon Cable, MD by Delphia Grates, ED Scribe. This patient was seen in room [facility]04/[facility]04 and the patient's care was started at 9:01 PM.    Chief Complaint  Patient presents with  . Hypertension     Patient is a 80 y.o. female presenting with hypertension. The history is provided by the patient. No language interpreter was used.  Hypertension This is a chronic problem. The current episode started 1 to 2 hours ago. The problem has not changed since onset.Pertinent negatives include no chest pain, no headaches and no shortness of breath. Nothing aggravates the symptoms. Nothing relieves the symptoms. She has tried nothing for the symptoms.    HPI Comments: Jodi Peters is a 80 y.o. Female, with history of HTN, hyperlipidemia who presents to the Emergency Department complaining of HTN onset PTA. Patient states she had just finished eating when she noticed associated dizziness upon standing. She states she tried resting, and checked her blood pressure and saw it was 266/100 and decided to come to the ED. There is associated weakness in her bilateral legs but this is not new for her. She also notes her head feels "woozy and full". Patient states she has history of vertigo which cause dizziness, but reports this episode of dizziness feels worse. She denies LOC, CP, vision changes, HA, SOB, or any pain at present. Patient is compliant with her BP medications, amlodipine and benazepril (last dose was 2 hours ago at 1900).   Past Medical History  Diagnosis Date  . Hyperlipidemia   . Hypertension   . Mitral regurgitation     sinus bradycardia  . Breast pain     leftr  . Venous insufficiency   . Allergic rhinitis   . Seborrheic keratosis   . Breast mass     left  . Vertigo   . Carpal tunnel syndrome   . Cataract   . Glaucoma   . Constipation   . IBS (irritable bowel syndrome)   . Osteopenia   .  Osteoarthritis   . Low back pain   . Breast cancer   . Anxiety   . Vitamin D deficiency   . Bradycardia    Past Surgical History  Procedure Laterality Date  . Appendectomy    . Cholecystectomy    . Breast lumpectomy    . Keloid      left ear  . Eye surgery      for bleed in left eye  . Cyst removed from left breast    . Spurs on back    . Back surgery    . Abdominal hysterectomy     Family History  Problem Relation Age of Onset  . Heart disease Mother   . Hypertension Mother   . Diabetes Mother    History  Substance Use Topics  . Smoking status: Never Smoker   . Smokeless tobacco: Never Used  . Alcohol Use: No   OB History   Grav Para Term Preterm Abortions TAB SAB Ect Mult Living                 Review of Systems  Constitutional: Negative for fever.  Eyes: Negative for visual disturbance.  Respiratory: Negative for shortness of breath.   Cardiovascular: Negative for chest pain.  Neurological: Positive for dizziness and weakness (bilateral legs). Negative for syncope and headaches.  All other systems reviewed and  are negative.     Allergies  Penicillins  Home Medications   Prior to Admission medications   Medication Sig Start Date End Date Taking? Authorizing Provider  ALPRAZolam (XANAX) 0.25 MG tablet Take 0.25 mg by mouth 2 (two) times daily as needed.     Historical Provider, MD  amLODipine (NORVASC) 5 MG tablet Take 1 tablet (5 mg total) by mouth daily. 04/24/13   Herminio Commons, MD  aspirin 81 MG tablet Take 81 mg by mouth daily.      Historical Provider, MD  benazepril (LOTENSIN) 10 MG tablet Take 10 mg by mouth daily.    Historical Provider, MD  Bimatoprost (LUMIGAN OP) Apply to eye at bedtime. 1 drop each eye     Historical Provider, MD  brimonidine (ALPHAGAN) 0.15 % ophthalmic solution Place 1 drop into both eyes 2 (two) times daily.     Historical Provider, MD  diphenhydrAMINE (BENADRYL) 25 mg capsule Take 25 mg by mouth at bedtime as  needed.    Historical Provider, MD  Docusate Calcium (STOOL SOFTENER PO) Take by mouth as needed.      Historical Provider, MD  dorzolamide-timolol (COSOPT) 22.3-6.8 MG/ML ophthalmic solution 1 drop 2 (two) times daily.      Historical Provider, MD  gemfibrozil (LOPID) 600 MG tablet Take 600 mg by mouth 2 (two) times daily before a meal.  02/25/13   Historical Provider, MD  latanoprost (XALATAN) 0.005 % ophthalmic solution Place 1 drop into both eyes at bedtime.    Historical Provider, MD  meclizine (ANTIVERT) 25 MG tablet Take 25 mg by mouth 3 (three) times daily as needed.    Historical Provider, MD  Multiple Vitamin (MULTI-VITAMIN DAILY PO) Take by mouth.      Historical Provider, MD  Naproxen Sodium (ALEVE) 220 MG CAPS Take by mouth.      Historical Provider, MD  Polyethyl Glycol-Propyl Glycol (SYSTANE OP) Apply 1 drop to eye as needed.    Historical Provider, MD  Pramox-PE-Glycerin-Petrolatum (PREPARATION H RE) Place rectally as needed.      Historical Provider, MD  traMADol (ULTRAM) 50 MG tablet Take 50 mg by mouth every 6 (six) hours as needed. Maximum dose= 8 tablets per day     Historical Provider, MD  travoprost, benzalkonium, (TRAVATAN) 0.004 % ophthalmic solution 1 drop at bedtime.      Historical Provider, MD   Triage Vitals: BP 205/98  Pulse 60  Temp(Src) 98.9 F (37.2 C) (Oral)  Resp 16  Ht 5' 4.5" (1.638 m)  Wt 143 lb (64.864 kg)  BMI 24.18 kg/m2  SpO2 100%  Physical Exam  Nursing note and vitals reviewed. CONSTITUTIONAL: Well developed/well nourished, no distress, smiling during exam HEAD: Normocephalic/atraumatic EYES: EOMI/PERRL, no nystagmus, no ptosis ENMT: Mucous membranes moist NECK: supple no meningeal signs, no bruits SPINE:entire spine nontender CV: S1/S2 noted, murmur noted LUNGS: Lungs are clear to auscultation bilaterally, no apparent distress ABDOMEN: soft, nontender, no rebound or guarding GU:no cva tenderness NEURO:Awake/alert, facies symmetric, no  arm or leg drift is noted Equal 5/5 strength with shoulder abduction, elbow flex/extension, wrist flex/extension in upper extremities and equal hand grips bilaterally Equal 5/5 strength with hip flexion,knee flex/extension, foot dorsi/plantar flexion Cranial nerves 3/4/5/6/12/18/08/11/12 tested and intact Gait normal without ataxia No past pointing Sensation to light touch intact in all extremities EXTREMITIES: pulses normal, full ROM SKIN: warm, color normal PSYCH: no abnormalities of mood noted    ED Course  Procedures   DIAGNOSTIC STUDIES: Oxygen Saturation is  100% on room air, normal by my interpretation.    COORDINATION OF CARE: At 2104 Discussed treatment plan with patient which includes Lotensin, EKG, and I-stat. Patient agrees.  10:43 PM Pt improved She admits that her dizziness is somewhat chronic as she gets this frequently She has no other complaints She denies significant HA, she denies CP and no new weakness (she mentioned chronic leg weakness but not new) I don't feel this represents acute hypertensive emergency She is well appearing, no distress, no signs of acute CVA/ACS We did give her an extra dose of her lotensin here and her BP started to lower I discussed need to call her PCP tomorrow for further guidance on her BP meds   Labs Review Labs Reviewed  I-STAT CHEM 8, ED - Abnormal; Notable for the following:    Potassium 3.6 (*)    Chloride 117 (*)    All other components within normal limits      EKG Interpretation   Date/Time:  Wednesday February 18 2014 20:58:25 EDT Ventricular Rate:  68 PR Interval:  159 QRS Duration: 89 QT Interval:  422 QTC Calculation: 449 R Axis:   42 Text Interpretation:  Sinus rhythm Non-specific ST-t changes No previous  ECGs available Confirmed by Christy Gentles  MD, Elenore Rota ([id number]) on 02/18/2014  9:06:01 PM      MDM   Final diagnoses:  Essential hypertension    Nursing notes including past medical history and social  history reviewed and considered in documentation Labs/vital reviewed and considered   I personally performed the services described in this documentation, which was scribed in my presence. The recorded information has been reviewed and is accurate.      Sharyon Cable, MD 02/18/14 2245

## 2014-02-18 NOTE — Discharge Instructions (Signed)

## 2014-02-18 NOTE — ED Notes (Signed)
Pt states she took her blood pressure was 266/100. Pt states she was dizzy earlier but denies now.

## 2014-05-14 ENCOUNTER — Other Ambulatory Visit (HOSPITAL_COMMUNITY): Payer: Self-pay | Admitting: Internal Medicine

## 2014-05-14 DIAGNOSIS — I10 Essential (primary) hypertension: Secondary | ICD-10-CM

## 2014-05-21 ENCOUNTER — Ambulatory Visit (HOSPITAL_COMMUNITY)
Admission: RE | Admit: 2014-05-21 | Discharge: 2014-05-21 | Disposition: A | Discharge status: Home / Self Care | Payer: Medicare Other | Source: Ambulatory Visit | Attending: Internal Medicine | Admitting: Internal Medicine

## 2014-05-21 DIAGNOSIS — I6523 Occlusion and stenosis of bilateral carotid arteries: Secondary | ICD-10-CM | POA: Diagnosis not present

## 2014-05-21 DIAGNOSIS — I10 Essential (primary) hypertension: Secondary | ICD-10-CM | POA: Diagnosis not present

## 2014-05-21 DIAGNOSIS — R42 Dizziness and giddiness: Secondary | ICD-10-CM | POA: Insufficient documentation

## 2014-06-17 DIAGNOSIS — E784 Other hyperlipidemia: Secondary | ICD-10-CM | POA: Diagnosis not present

## 2014-06-17 DIAGNOSIS — I10 Essential (primary) hypertension: Secondary | ICD-10-CM | POA: Diagnosis not present

## 2014-06-17 DIAGNOSIS — N816 Rectocele: Secondary | ICD-10-CM | POA: Diagnosis not present

## 2014-06-24 ENCOUNTER — Encounter: Payer: Self-pay | Admitting: *Deleted

## 2014-06-30 ENCOUNTER — Ambulatory Visit (INDEPENDENT_AMBULATORY_CARE_PROVIDER_SITE_OTHER): Payer: Medicare Other | Admitting: Adult Health

## 2014-06-30 ENCOUNTER — Encounter: Payer: Self-pay | Admitting: Adult Health

## 2014-06-30 VITALS — BP 178/70 | Ht 64.5 in | Wt 144.0 lb

## 2014-06-30 DIAGNOSIS — IMO0002 Reserved for concepts with insufficient information to code with codable children: Secondary | ICD-10-CM

## 2014-06-30 DIAGNOSIS — N8110 Cystocele, unspecified: Secondary | ICD-10-CM

## 2014-06-30 DIAGNOSIS — N816 Rectocele: Secondary | ICD-10-CM

## 2014-06-30 HISTORY — DX: Rectocele: N81.6

## 2014-06-30 HISTORY — DX: Reserved for concepts with insufficient information to code with codable children: IMO0002

## 2014-06-30 NOTE — Progress Notes (Signed)
Subjective:     Patient ID: Jodi Peters, female   DOB: 10-13-1929, 81 y.o.   MRN: [id number]  HPI Jodi Peters is a  81 year old black female referred by Dr Legrand Rams for rectocele.  Review of Systems See HPI Reviewed past medical,surgical, social and family history. Reviewed medications and allergies.     Objective:   Physical Exam BP 178/70 mmHg  Ht 5' 4.5" (1.638 m)  Wt 144 lb (65.318 kg)  BMI 24.34 kg/m2   Skin warm and dry.Pelvic: external genitalia is normal in appearance for age no lesions, vagina:mild cystocele, +rectocele, atrophic, cervix and uterus are absent, adnexa: no masses or tenderness noted. On rectal exam +rectocele. She declines surgery, showed her medical explainer #4 about cystocele an rectocele.  Assessment:     Rectocele Cystocele     Plan:     Try miralax or prune juice or 5 prunes per day to keep stool soft Follow up prn

## 2014-06-30 NOTE — Patient Instructions (Addendum)
Eat 5-6 prunes daily, try miralax Follow up prn

## 2014-08-11 DIAGNOSIS — H4011X0 Primary open-angle glaucoma, stage unspecified: Secondary | ICD-10-CM | POA: Diagnosis not present

## 2014-08-11 DIAGNOSIS — H2511 Age-related nuclear cataract, right eye: Secondary | ICD-10-CM | POA: Diagnosis not present

## 2014-08-11 DIAGNOSIS — Z961 Presence of intraocular lens: Secondary | ICD-10-CM | POA: Diagnosis not present

## 2014-08-18 DIAGNOSIS — H4011X3 Primary open-angle glaucoma, severe stage: Secondary | ICD-10-CM | POA: Diagnosis not present

## 2014-08-25 DIAGNOSIS — I739 Peripheral vascular disease, unspecified: Secondary | ICD-10-CM | POA: Diagnosis not present

## 2014-09-09 DIAGNOSIS — H4011X3 Primary open-angle glaucoma, severe stage: Secondary | ICD-10-CM | POA: Diagnosis not present

## 2014-09-15 DIAGNOSIS — M159 Polyosteoarthritis, unspecified: Secondary | ICD-10-CM | POA: Diagnosis not present

## 2014-09-15 DIAGNOSIS — I10 Essential (primary) hypertension: Secondary | ICD-10-CM | POA: Diagnosis not present

## 2014-09-15 DIAGNOSIS — M549 Dorsalgia, unspecified: Secondary | ICD-10-CM | POA: Diagnosis not present

## 2014-09-15 DIAGNOSIS — E784 Other hyperlipidemia: Secondary | ICD-10-CM | POA: Diagnosis not present

## 2014-10-21 DIAGNOSIS — H25811 Combined forms of age-related cataract, right eye: Secondary | ICD-10-CM | POA: Diagnosis not present

## 2014-10-21 DIAGNOSIS — H4011X3 Primary open-angle glaucoma, severe stage: Secondary | ICD-10-CM | POA: Diagnosis not present

## 2014-10-21 DIAGNOSIS — Z961 Presence of intraocular lens: Secondary | ICD-10-CM | POA: Diagnosis not present

## 2014-12-16 ENCOUNTER — Encounter (HOSPITAL_COMMUNITY): Payer: Self-pay | Admitting: Emergency Medicine

## 2014-12-16 ENCOUNTER — Emergency Department (HOSPITAL_COMMUNITY)
Admission: EM | Admit: 2014-12-16 | Discharge: 2014-12-16 | Disposition: A | Discharge status: Home / Self Care | Payer: Medicare Other | Attending: Emergency Medicine | Admitting: Emergency Medicine

## 2014-12-16 DIAGNOSIS — Z853 Personal history of malignant neoplasm of breast: Secondary | ICD-10-CM | POA: Diagnosis not present

## 2014-12-16 DIAGNOSIS — M79651 Pain in right thigh: Secondary | ICD-10-CM

## 2014-12-16 DIAGNOSIS — F419 Anxiety disorder, unspecified: Secondary | ICD-10-CM | POA: Insufficient documentation

## 2014-12-16 DIAGNOSIS — M1990 Unspecified osteoarthritis, unspecified site: Secondary | ICD-10-CM | POA: Diagnosis not present

## 2014-12-16 DIAGNOSIS — I10 Essential (primary) hypertension: Secondary | ICD-10-CM | POA: Diagnosis not present

## 2014-12-16 DIAGNOSIS — M79652 Pain in left thigh: Secondary | ICD-10-CM | POA: Insufficient documentation

## 2014-12-16 DIAGNOSIS — Z880 Allergy status to penicillin: Secondary | ICD-10-CM | POA: Insufficient documentation

## 2014-12-16 DIAGNOSIS — K5900 Constipation, unspecified: Secondary | ICD-10-CM | POA: Diagnosis not present

## 2014-12-16 DIAGNOSIS — E559 Vitamin D deficiency, unspecified: Secondary | ICD-10-CM | POA: Diagnosis not present

## 2014-12-16 DIAGNOSIS — Z8742 Personal history of other diseases of the female genital tract: Secondary | ICD-10-CM | POA: Diagnosis not present

## 2014-12-16 DIAGNOSIS — Z8669 Personal history of other diseases of the nervous system and sense organs: Secondary | ICD-10-CM | POA: Insufficient documentation

## 2014-12-16 DIAGNOSIS — Z79899 Other long term (current) drug therapy: Secondary | ICD-10-CM | POA: Insufficient documentation

## 2014-12-16 DIAGNOSIS — Z872 Personal history of diseases of the skin and subcutaneous tissue: Secondary | ICD-10-CM | POA: Insufficient documentation

## 2014-12-16 LAB — CBC WITH DIFFERENTIAL/PLATELET
BASOS ABS: 0 10*3/uL (ref 0.0–0.1)
Basophils Relative: 0 % (ref 0–1)
EOS ABS: 0.1 10*3/uL (ref 0.0–0.7)
EOS PCT: 1 % (ref 0–5)
HCT: 37.9 % (ref 36.0–46.0)
Hemoglobin: 12.2 g/dL (ref 12.0–15.0)
LYMPHS ABS: 2.3 10*3/uL (ref 0.7–4.0)
LYMPHS PCT: 23 % (ref 12–46)
MCH: 29.2 pg (ref 26.0–34.0)
MCHC: 32.2 g/dL (ref 30.0–36.0)
MCV: 90.7 fL (ref 78.0–100.0)
MONO ABS: 1.5 10*3/uL — AB (ref 0.1–1.0)
Monocytes Relative: 16 % — ABNORMAL HIGH (ref 3–12)
Neutro Abs: 5.7 10*3/uL (ref 1.7–7.7)
Neutrophils Relative %: 60 % (ref 43–77)
Platelets: 318 10*3/uL (ref 150–400)
RBC: 4.18 MIL/uL (ref 3.87–5.11)
RDW: 13.5 % (ref 11.5–15.5)
WBC: 9.6 10*3/uL (ref 4.0–10.5)

## 2014-12-16 LAB — COMPREHENSIVE METABOLIC PANEL
ALK PHOS: 116 U/L (ref 38–126)
ALT: 12 U/L — ABNORMAL LOW (ref 14–54)
ANION GAP: 9 (ref 5–15)
AST: 19 U/L (ref 15–41)
Albumin: 4 g/dL (ref 3.5–5.0)
BUN: 24 mg/dL — ABNORMAL HIGH (ref 6–20)
CHLORIDE: 106 mmol/L (ref 101–111)
CO2: 25 mmol/L (ref 22–32)
Calcium: 10 mg/dL (ref 8.9–10.3)
Creatinine, Ser: 1.04 mg/dL — ABNORMAL HIGH (ref 0.44–1.00)
GFR calc non Af Amer: 48 mL/min — ABNORMAL LOW (ref >60)
GFR, EST AFRICAN AMERICAN: 56 mL/min — AB (ref >60)
GLUCOSE: 107 mg/dL — AB (ref 65–99)
Potassium: 3.9 mmol/L (ref 3.5–5.1)
SODIUM: 140 mmol/L (ref 135–145)
Total Bilirubin: 0.6 mg/dL (ref 0.3–1.2)
Total Protein: 7.8 g/dL (ref 6.5–8.1)

## 2014-12-16 MED ORDER — CYCLOBENZAPRINE HCL 5 MG PO TABS: 5.0000 mg | ORAL_TABLET | Freq: Two times a day (BID) | ORAL | Status: DC | PRN

## 2014-12-16 MED ORDER — CYCLOBENZAPRINE HCL 10 MG PO TABS
5.0000 mg | ORAL_TABLET | Freq: Once | ORAL | Status: AC
  Administered 2014-12-16: 5 mg via ORAL
  Filled 2014-12-16: qty 1

## 2014-12-16 NOTE — ED Notes (Signed)
Onset 2 days ago pain in upper thighs bilaterally

## 2014-12-16 NOTE — Discharge Instructions (Signed)
Blood work was normal.  Tylenol or Ibuprofen for pain.  Follow up your dr

## 2014-12-16 NOTE — ED Notes (Signed)
Pt made aware to return if symptoms worsen or if any life threatening symptoms occur.   

## 2014-12-16 NOTE — ED Provider Notes (Signed)
CSN: [id number]     Arrival date & time 12/16/14  1319 History   First MD Initiated Contact with Patient 12/16/14 1542     Chief Complaint  Patient presents with  . Leg Pain     (Consider location/radiation/quality/duration/timing/severity/associated sxs/prior Treatment) HPI.... Bilateral anterior thigh discomfort for 2 days. No posterior thigh or posterior calf pain or tenderness. No injury, trauma, fever, chills. Severity is mild. Palpation makes symptoms worse.  Past Medical History  Diagnosis Date  . Hyperlipidemia   . Hypertension   . Mitral regurgitation     sinus bradycardia  . Breast pain     leftr  . Venous insufficiency   . Allergic rhinitis   . Seborrheic keratosis   . Breast mass     left  . Vertigo   . Carpal tunnel syndrome   . Cataract   . Glaucoma   . Constipation   . IBS (irritable bowel syndrome)   . Osteopenia   . Osteoarthritis   . Low back pain   . Breast cancer   . Anxiety   . Vitamin D deficiency   . Bradycardia   . Carotid atherosclerosis   . Rectocele 06/30/2014  . Cystocele 06/30/2014   Past Surgical History  Procedure Laterality Date  . Appendectomy    . Cholecystectomy    . Breast lumpectomy    . Keloid      left ear  . Eye surgery      for bleed in left eye  . Cyst removed from left breast    . Spurs on back    . Back surgery    . Abdominal hysterectomy     Family History  Problem Relation Age of Onset  . Heart disease Mother   . Hypertension Mother   . Diabetes Mother   . Other Daughter     left breast nodule  . Hypertension Son   . Hyperlipidemia Son   . Heart disease Maternal Grandmother   . Hypertension Maternal Grandmother   . Hypertension Son   . Other Son     boils   History  Substance Use Topics  . Smoking status: Never Smoker   . Smokeless tobacco: Never Used  . Alcohol Use: No   OB History    Gravida Para Term Preterm AB TAB SAB Ectopic Multiple Living   5 5        5      Review of Systems  All other  systems reviewed and are negative.     Allergies  Penicillins  Home Medications   Prior to Admission medications   Medication Sig Start Date End Date Taking? Authorizing Provider  ALPRAZolam (XANAX) 0.25 MG tablet Take 0.125-0.25 mg by mouth 2 (two) times daily as needed for anxiety.   Yes Historical Provider, MD  amLODipine (NORVASC) 5 MG tablet Take 5 mg by mouth daily.   Yes Historical Provider, MD  aspirin EC 81 MG tablet Take 81 mg by mouth every morning.    Yes Historical Provider, MD  brimonidine (ALPHAGAN) 0.15 % ophthalmic solution Place 1 drop into both eyes 2 (two) times daily.    Yes Historical Provider, MD  diphenhydrAMINE (BENADRYL) 25 mg capsule Take 25 mg by mouth at bedtime as needed for allergies.    Yes Historical Provider, MD  docusate sodium (COLACE) 100 MG capsule Take 100 mg by mouth daily as needed (constipation).   Yes Historical Provider, MD  dorzolamide-timolol (COSOPT) 22.3-6.8 MG/ML ophthalmic solution 1 drop 2 (two)  times daily.     Yes Historical Provider, MD  gemfibrozil (LOPID) 600 MG tablet Take 600 mg by mouth 2 (two) times daily before a meal.  02/25/13  Yes Historical Provider, MD  latanoprost (XALATAN) 0.005 % ophthalmic solution Place 1 drop into both eyes at bedtime.   Yes Historical Provider, MD  lisinopril (PRINIVIL,ZESTRIL) 10 MG tablet 10 mg 2 (two) times daily.  05/22/14  Yes Historical Provider, MD  meclizine (ANTIVERT) 25 MG tablet Take 12.5 mg by mouth 3 (three) times daily as needed for dizziness.    Yes Historical Provider, MD  Multiple Vitamin (MULTIVITAMIN) tablet Take 1 tablet by mouth daily.   Yes Historical Provider, MD  pilocarpine (PILOCAR) 4 % ophthalmic solution Place 1 drop into both eyes at bedtime. 02/17/14  Yes Historical Provider, MD  Polyethyl Glycol-Propyl Glycol (SYSTANE OP) Apply 1 drop to eye as needed (for dry eye relief).    Yes Historical Provider, MD  Simethicone (GAS-X PO) Take by mouth as needed.   Yes Historical  Provider, MD  traMADol (ULTRAM) 50 MG tablet Take 50 mg by mouth 2 (two) times daily.   Yes Historical Provider, MD  Vitamin D, Ergocalciferol, (DRISDOL) 50000 UNITS CAPS capsule Take 50,000 Units by mouth every 30 (thirty) days.    Yes Historical Provider, MD   BP 141/62 mmHg  Pulse 61  Temp(Src) 99.2 F (37.3 C) (Oral)  Resp 18  Ht 5' 4.5" (1.638 m)  Wt 145 lb (65.772 kg)  BMI 24.51 kg/m2  SpO2 99% Physical Exam  Constitutional: She is oriented to person, place, and time. She appears well-developed and well-nourished.  HENT:  Head: Normocephalic and atraumatic.  Eyes: Conjunctivae and EOM are normal. Pupils are equal, round, and reactive to light.  Neck: Normal range of motion. Neck supple.  Cardiovascular: Normal rate and regular rhythm.   Pulmonary/Chest: Effort normal and breath sounds normal.  Abdominal: Soft. Bowel sounds are normal.  Musculoskeletal:  Minimal tenderness in bilateral anterior thighs. No rash, erythema, nodularity, mass.  Neurological: She is alert and oriented to person, place, and time.  Skin: Skin is warm and dry.  Psychiatric: She has a normal mood and affect. Her behavior is normal.  Nursing note and vitals reviewed.   ED Course  Procedures (including critical care time) Labs Review Labs Reviewed  COMPREHENSIVE METABOLIC PANEL - Abnormal; Notable for the following:    Glucose, Bld 107 (*)    BUN 24 (*)    Creatinine, Ser 1.04 (*)    ALT 12 (*)    GFR calc non Af Amer 48 (*)    GFR calc Af Amer 56 (*)    All other components within normal limits  CBC WITH DIFFERENTIAL/PLATELET - Abnormal; Notable for the following:    Monocytes Relative 16 (*)    Monocytes Absolute 1.5 (*)    All other components within normal limits    Imaging Review No results found.   EKG Interpretation None      MDM   Final diagnoses:  Bilateral thigh pain    Suspect muscular discomfort. Rx OTCs plus Flexeril 5 mg.    Nat Christen, MD 12/16/14 1754

## 2014-12-22 ENCOUNTER — Other Ambulatory Visit (HOSPITAL_COMMUNITY): Payer: Self-pay | Admitting: Internal Medicine

## 2014-12-22 ENCOUNTER — Other Ambulatory Visit: Payer: Self-pay

## 2014-12-22 ENCOUNTER — Inpatient Hospital Stay (HOSPITAL_COMMUNITY)
Admission: RE | Admit: 2014-12-22 | Discharge: 2014-12-31 | DRG: 519 | Disposition: A | Discharge status: Skilled Nursing Facility | Payer: Medicare Other | Source: Ambulatory Visit | Attending: Internal Medicine | Admitting: Internal Medicine

## 2014-12-22 ENCOUNTER — Encounter (HOSPITAL_COMMUNITY): Payer: Self-pay | Admitting: *Deleted

## 2014-12-22 ENCOUNTER — Ambulatory Visit (HOSPITAL_COMMUNITY)
Admission: RE | Admit: 2014-12-22 | Discharge: 2014-12-22 | Disposition: A | Discharge status: Home / Self Care | Payer: Medicare Other | Source: Ambulatory Visit | Attending: Internal Medicine | Admitting: Internal Medicine

## 2014-12-22 DIAGNOSIS — Z79899 Other long term (current) drug therapy: Secondary | ICD-10-CM

## 2014-12-22 DIAGNOSIS — K589 Irritable bowel syndrome without diarrhea: Secondary | ICD-10-CM | POA: Diagnosis present

## 2014-12-22 DIAGNOSIS — G9581 Conus medullaris syndrome: Secondary | ICD-10-CM | POA: Diagnosis present

## 2014-12-22 DIAGNOSIS — M5104 Intervertebral disc disorders with myelopathy, thoracic region: Secondary | ICD-10-CM | POA: Diagnosis not present

## 2014-12-22 DIAGNOSIS — M545 Low back pain, unspecified: Secondary | ICD-10-CM

## 2014-12-22 DIAGNOSIS — M4714 Other spondylosis with myelopathy, thoracic region: Secondary | ICD-10-CM | POA: Diagnosis not present

## 2014-12-22 DIAGNOSIS — Z7982 Long term (current) use of aspirin: Secondary | ICD-10-CM

## 2014-12-22 DIAGNOSIS — F419 Anxiety disorder, unspecified: Secondary | ICD-10-CM

## 2014-12-22 DIAGNOSIS — M4804 Spinal stenosis, thoracic region: Secondary | ICD-10-CM | POA: Insufficient documentation

## 2014-12-22 DIAGNOSIS — M48061 Spinal stenosis, lumbar region without neurogenic claudication: Secondary | ICD-10-CM | POA: Insufficient documentation

## 2014-12-22 DIAGNOSIS — F411 Generalized anxiety disorder: Secondary | ICD-10-CM | POA: Diagnosis not present

## 2014-12-22 DIAGNOSIS — J301 Allergic rhinitis due to pollen: Secondary | ICD-10-CM | POA: Diagnosis not present

## 2014-12-22 DIAGNOSIS — M5106 Intervertebral disc disorders with myelopathy, lumbar region: Secondary | ICD-10-CM | POA: Diagnosis present

## 2014-12-22 DIAGNOSIS — Z853 Personal history of malignant neoplasm of breast: Secondary | ICD-10-CM

## 2014-12-22 DIAGNOSIS — R339 Retention of urine, unspecified: Secondary | ICD-10-CM | POA: Diagnosis not present

## 2014-12-22 DIAGNOSIS — G9520 Unspecified cord compression: Secondary | ICD-10-CM

## 2014-12-22 DIAGNOSIS — E785 Hyperlipidemia, unspecified: Secondary | ICD-10-CM | POA: Diagnosis present

## 2014-12-22 DIAGNOSIS — K5900 Constipation, unspecified: Secondary | ICD-10-CM | POA: Diagnosis not present

## 2014-12-22 DIAGNOSIS — R278 Other lack of coordination: Secondary | ICD-10-CM | POA: Diagnosis not present

## 2014-12-22 DIAGNOSIS — I10 Essential (primary) hypertension: Secondary | ICD-10-CM

## 2014-12-22 DIAGNOSIS — M1990 Unspecified osteoarthritis, unspecified site: Secondary | ICD-10-CM | POA: Diagnosis not present

## 2014-12-22 DIAGNOSIS — M4725 Other spondylosis with radiculopathy, thoracolumbar region: Secondary | ICD-10-CM | POA: Diagnosis not present

## 2014-12-22 DIAGNOSIS — Z9889 Other specified postprocedural states: Secondary | ICD-10-CM | POA: Diagnosis not present

## 2014-12-22 DIAGNOSIS — M4806 Spinal stenosis, lumbar region: Secondary | ICD-10-CM | POA: Diagnosis not present

## 2014-12-22 DIAGNOSIS — N390 Urinary tract infection, site not specified: Secondary | ICD-10-CM

## 2014-12-22 DIAGNOSIS — K219 Gastro-esophageal reflux disease without esophagitis: Secondary | ICD-10-CM | POA: Diagnosis not present

## 2014-12-22 DIAGNOSIS — M5124 Other intervertebral disc displacement, thoracic region: Secondary | ICD-10-CM | POA: Diagnosis not present

## 2014-12-22 DIAGNOSIS — M4800 Spinal stenosis, site unspecified: Secondary | ICD-10-CM | POA: Diagnosis present

## 2014-12-22 DIAGNOSIS — E559 Vitamin D deficiency, unspecified: Secondary | ICD-10-CM | POA: Diagnosis present

## 2014-12-22 DIAGNOSIS — H409 Unspecified glaucoma: Secondary | ICD-10-CM

## 2014-12-22 DIAGNOSIS — M4807 Spinal stenosis, lumbosacral region: Secondary | ICD-10-CM | POA: Diagnosis not present

## 2014-12-22 DIAGNOSIS — M6281 Muscle weakness (generalized): Secondary | ICD-10-CM | POA: Diagnosis not present

## 2014-12-22 DIAGNOSIS — Z48811 Encounter for surgical aftercare following surgery on the nervous system: Secondary | ICD-10-CM | POA: Diagnosis not present

## 2014-12-22 DIAGNOSIS — R2681 Unsteadiness on feet: Secondary | ICD-10-CM | POA: Diagnosis not present

## 2014-12-22 DIAGNOSIS — I340 Nonrheumatic mitral (valve) insufficiency: Secondary | ICD-10-CM | POA: Diagnosis not present

## 2014-12-22 DIAGNOSIS — M5126 Other intervertebral disc displacement, lumbar region: Secondary | ICD-10-CM | POA: Diagnosis not present

## 2014-12-22 DIAGNOSIS — R262 Difficulty in walking, not elsewhere classified: Secondary | ICD-10-CM | POA: Diagnosis not present

## 2014-12-22 DIAGNOSIS — M4802 Spinal stenosis, cervical region: Secondary | ICD-10-CM | POA: Diagnosis not present

## 2014-12-22 DIAGNOSIS — Z419 Encounter for procedure for purposes other than remedying health state, unspecified: Secondary | ICD-10-CM

## 2014-12-22 LAB — BASIC METABOLIC PANEL
Anion gap: 10 (ref 5–15)
BUN: 23 mg/dL — ABNORMAL HIGH (ref 6–20)
CHLORIDE: 106 mmol/L (ref 101–111)
CO2: 27 mmol/L (ref 22–32)
Calcium: 10.2 mg/dL (ref 8.9–10.3)
Creatinine, Ser: 1.02 mg/dL — ABNORMAL HIGH (ref 0.44–1.00)
GFR calc Af Amer: 57 mL/min — ABNORMAL LOW (ref >60)
GFR calc non Af Amer: 49 mL/min — ABNORMAL LOW (ref >60)
Glucose, Bld: 99 mg/dL (ref 65–99)
POTASSIUM: 3.5 mmol/L (ref 3.5–5.1)
SODIUM: 143 mmol/L (ref 135–145)

## 2014-12-22 LAB — CBC WITH DIFFERENTIAL/PLATELET
BASOS PCT: 1 % (ref 0–1)
Basophils Absolute: 0 10*3/uL (ref 0.0–0.1)
Eosinophils Absolute: 0.2 10*3/uL (ref 0.0–0.7)
Eosinophils Relative: 3 % (ref 0–5)
HEMATOCRIT: 38.9 % (ref 36.0–46.0)
HEMOGLOBIN: 12.7 g/dL (ref 12.0–15.0)
Lymphocytes Relative: 35 % (ref 12–46)
Lymphs Abs: 2.5 10*3/uL (ref 0.7–4.0)
MCH: 29.4 pg (ref 26.0–34.0)
MCHC: 32.6 g/dL (ref 30.0–36.0)
MCV: 90 fL (ref 78.0–100.0)
Monocytes Absolute: 0.8 10*3/uL (ref 0.1–1.0)
Monocytes Relative: 11 % (ref 3–12)
Neutro Abs: 3.6 10*3/uL (ref 1.7–7.7)
Neutrophils Relative %: 50 % (ref 43–77)
Platelets: 430 10*3/uL — ABNORMAL HIGH (ref 150–400)
RBC: 4.32 MIL/uL (ref 3.87–5.11)
RDW: 13.2 % (ref 11.5–15.5)
WBC: 7.1 10*3/uL (ref 4.0–10.5)

## 2014-12-22 LAB — URINALYSIS, ROUTINE W REFLEX MICROSCOPIC
Bilirubin Urine: NEGATIVE
Glucose, UA: NEGATIVE mg/dL
Hgb urine dipstick: NEGATIVE
Ketones, ur: NEGATIVE mg/dL
NITRITE: NEGATIVE
Protein, ur: 30 mg/dL — AB
Specific Gravity, Urine: 1.02 (ref 1.005–1.030)
Urobilinogen, UA: 0.2 mg/dL (ref 0.0–1.0)
pH: 6 (ref 5.0–8.0)

## 2014-12-22 LAB — URINE MICROSCOPIC-ADD ON

## 2014-12-22 MED ORDER — HEPARIN SODIUM (PORCINE) 5000 UNIT/ML IJ SOLN
5000.0000 [IU] | Freq: Three times a day (TID) | INTRAMUSCULAR | Status: DC
  Administered 2014-12-23 – 2014-12-31 (×23): 5000 [IU] via SUBCUTANEOUS
  Filled 2014-12-22 (×23): qty 1

## 2014-12-22 MED ORDER — ONDANSETRON HCL 4 MG PO TABS: 4.0000 mg | ORAL_TABLET | Freq: Four times a day (QID) | ORAL | Status: DC | PRN

## 2014-12-22 MED ORDER — LISINOPRIL 10 MG PO TABS: 10.0000 mg | ORAL_TABLET | Freq: Every day | ORAL | Status: DC

## 2014-12-22 MED ORDER — PILOCARPINE HCL 4 % OP SOLN
1.0000 [drp] | Freq: Every day | OPHTHALMIC | Status: DC
  Administered 2014-12-24 – 2014-12-30 (×7): 1 [drp] via OPHTHALMIC
  Filled 2014-12-22 (×2): qty 15

## 2014-12-22 MED ORDER — ONDANSETRON HCL 4 MG/2ML IJ SOLN
4.0000 mg | Freq: Four times a day (QID) | INTRAMUSCULAR | Status: DC | PRN
  Administered 2014-12-25: 4 mg via INTRAVENOUS

## 2014-12-22 MED ORDER — SIMETHICONE 80 MG PO CHEW: 80.0000 mg | CHEWABLE_TABLET | Freq: Every day | ORAL | Status: DC | PRN

## 2014-12-22 MED ORDER — ALPRAZOLAM 0.25 MG PO TABS
0.1250 mg | ORAL_TABLET | Freq: Two times a day (BID) | ORAL | Status: DC | PRN
  Administered 2014-12-28: 0.25 mg via ORAL
  Filled 2014-12-22: qty 1

## 2014-12-22 MED ORDER — GEMFIBROZIL 600 MG PO TABS
600.0000 mg | ORAL_TABLET | Freq: Two times a day (BID) | ORAL | Status: DC
Start: 2014-12-23 — End: 2014-12-31
  Administered 2014-12-23 – 2014-12-31 (×15): 600 mg via ORAL
  Filled 2014-12-22 (×21): qty 1

## 2014-12-22 MED ORDER — AMLODIPINE BESYLATE 5 MG PO TABS: 5.0000 mg | ORAL_TABLET | Freq: Every day | ORAL | Status: DC

## 2014-12-22 MED ORDER — ACETAMINOPHEN 500 MG PO TABS
1000.0000 mg | ORAL_TABLET | Freq: Three times a day (TID) | ORAL | Status: DC
  Administered 2014-12-23 – 2014-12-31 (×23): 1000 mg via ORAL
  Filled 2014-12-22 (×26): qty 2

## 2014-12-22 MED ORDER — DOCUSATE SODIUM 100 MG PO CAPS
100.0000 mg | ORAL_CAPSULE | Freq: Every day | ORAL | Status: DC | PRN
  Filled 2014-12-22: qty 1

## 2014-12-22 MED ORDER — CYCLOBENZAPRINE HCL 10 MG PO TABS: 5.0000 mg | ORAL_TABLET | Freq: Two times a day (BID) | ORAL | Status: DC | PRN

## 2014-12-22 MED ORDER — ASPIRIN EC 81 MG PO TBEC
81.0000 mg | DELAYED_RELEASE_TABLET | Freq: Every morning | ORAL | Status: DC
  Filled 2014-12-22: qty 1

## 2014-12-22 MED ORDER — BRIMONIDINE TARTRATE 0.15 % OP SOLN
1.0000 [drp] | Freq: Two times a day (BID) | OPHTHALMIC | Status: DC
Start: 2014-12-22 — End: 2014-12-23
  Filled 2014-12-22 (×2): qty 5

## 2014-12-22 MED ORDER — HYDRALAZINE HCL 20 MG/ML IJ SOLN
5.0000 mg | INTRAMUSCULAR | Status: DC | PRN
  Administered 2014-12-22: 5 mg via INTRAVENOUS
  Administered 2014-12-23 – 2014-12-27 (×3): 10 mg via INTRAVENOUS
  Administered 2014-12-28 – 2014-12-31 (×3): 5 mg via INTRAVENOUS
  Filled 2014-12-22 (×7): qty 1

## 2014-12-22 MED ORDER — TRAMADOL HCL 50 MG PO TABS
50.0000 mg | ORAL_TABLET | Freq: Two times a day (BID) | ORAL | Status: DC
  Administered 2014-12-23 – 2014-12-31 (×16): 50 mg via ORAL
  Filled 2014-12-22 (×17): qty 1

## 2014-12-22 MED ORDER — LATANOPROST 0.005 % OP SOLN
1.0000 [drp] | Freq: Every day | OPHTHALMIC | Status: DC
  Administered 2014-12-24 – 2014-12-30 (×7): 1 [drp] via OPHTHALMIC
  Filled 2014-12-22 (×2): qty 2.5

## 2014-12-22 MED ORDER — DORZOLAMIDE HCL-TIMOLOL MAL 2-0.5 % OP SOLN
1.0000 [drp] | Freq: Two times a day (BID) | OPHTHALMIC | Status: DC
  Administered 2014-12-23 – 2014-12-31 (×17): 1 [drp] via OPHTHALMIC
  Filled 2014-12-22 (×2): qty 10

## 2014-12-22 NOTE — ED Notes (Signed)
MD McManus at bedside. 

## 2014-12-22 NOTE — H&P (Signed)
[facility] History and Physical  Jodi Peters ZYS:[id number] DOB: 05-22-1930 DOA: 12/22/2014  Referring physician: Dr Thurnell Garbe - [facility] PCP: Rosita Fire, MD   Chief Complaint: thigh pain  HPI: Jodi Peters is a 81 y.o. female  Thigh pain. Radiation bilateral to calves. Muscle relaxer and tylenol w/ minimal improvement. Described as a cramping sensation. Started 2 weeks ago. Intermittent initially but progressive and now constant. Symptoms are worse when ambulating. Patient able to walk shorter and shorter distances over the past 2 weeks due to worsening symptoms. 3 days ago developed lower central back pain. Symptoms improve and patient is resting in bed. Denies any lower back trauma, loss of bowel or bladder function,.    Review of Systems:  Constitutional:  No weight loss, night sweats, Fevers, chills, fatigue.  HEENT:  No headaches, Difficulty swallowing,Tooth/dental problems,Sore throat,  No sneezing, itching, ear ache, nasal congestion, post nasal drip,  Cardio-vascular:  No chest pain, Orthopnea, PND, swelling in lower extremities, anasarca, dizziness, palpitations  GI:  No heartburn, indigestion, abdominal pain, nausea, vomiting, diarrhea, change in bowel habits, loss of appetite  Resp:   No shortness of breath with exertion or at rest. No excess mucus, no productive cough, No non-productive cough, No coughing up of blood.No change in color of mucus.No wheezing.No chest wall deformity  Skin:  no rash or lesions.  GU:  no dysuria, change in color of urine, no urgency or frequency. No flank pain.  Musculoskeletal:  Per HPI Psych:  No change in mood or affect. No depression or anxiety. No memory loss.   Past Medical History  Diagnosis Date  . Hyperlipidemia   . Hypertension   . Mitral regurgitation     sinus bradycardia  . Breast pain     leftr  . Venous insufficiency   . Allergic rhinitis   . Seborrheic keratosis   . Breast mass     left  . Vertigo   .  Carpal tunnel syndrome   . Cataract   . Glaucoma   . Constipation   . IBS (irritable bowel syndrome)   . Osteopenia   . Osteoarthritis   . Low back pain   . Breast cancer   . Anxiety   . Vitamin D deficiency   . Bradycardia   . Carotid atherosclerosis   . Rectocele 06/30/2014  . Cystocele 06/30/2014   Past Surgical History  Procedure Laterality Date  . Appendectomy    . Cholecystectomy    . Breast lumpectomy    . Keloid      left ear  . Eye surgery      for bleed in left eye  . Cyst removed from left breast    . Spurs on back    . Back surgery    . Abdominal hysterectomy     Social History:  reports that she has never smoked. She has never used smokeless tobacco. She reports that she does not drink alcohol or use illicit drugs.  Allergies  Allergen Reactions  . Penicillins Rash    Family History  Problem Relation Age of Onset  . Heart disease Mother   . Hypertension Mother   . Diabetes Mother   . Other Daughter     left breast nodule  . Hypertension Son   . Hyperlipidemia Son   . Heart disease Maternal Grandmother   . Hypertension Maternal Grandmother   . Hypertension Son   . Other Son     boils     Prior to  Admission medications   Medication Sig Start Date End Date Taking? Authorizing Provider  acetaminophen (TYLENOL) 500 MG tablet Take 500 mg by mouth every 6 (six) hours as needed for mild pain or moderate pain.   Yes Historical Provider, MD  ALPRAZolam (XANAX) 0.25 MG tablet Take 0.125-0.25 mg by mouth 2 (two) times daily as needed for anxiety.   Yes Historical Provider, MD  amLODipine (NORVASC) 5 MG tablet Take 5 mg by mouth daily.   Yes Historical Provider, MD  aspirin EC 81 MG tablet Take 81 mg by mouth every morning.    Yes Historical Provider, MD  brimonidine (ALPHAGAN) 0.15 % ophthalmic solution Place 1 drop into both eyes 2 (two) times daily.    Yes Historical Provider, MD  cyclobenzaprine (FLEXERIL) 5 MG tablet Take 1 tablet (5 mg total) by mouth 2  (two) times daily as needed for muscle spasms. 12/16/14  Yes Nat Christen, MD  diphenhydrAMINE (BENADRYL) 25 mg capsule Take 25 mg by mouth at bedtime as needed for allergies.    Yes Historical Provider, MD  docusate sodium (COLACE) 100 MG capsule Take 100 mg by mouth daily as needed (constipation).   Yes Historical Provider, MD  dorzolamide-timolol (COSOPT) 22.3-6.8 MG/ML ophthalmic solution 1 drop 2 (two) times daily.     Yes Historical Provider, MD  gemfibrozil (LOPID) 600 MG tablet Take 600 mg by mouth 2 (two) times daily before a meal.  02/25/13  Yes Historical Provider, MD  latanoprost (XALATAN) 0.005 % ophthalmic solution Place 1 drop into both eyes at bedtime.   Yes Historical Provider, MD  lisinopril (PRINIVIL,ZESTRIL) 10 MG tablet 10 mg 2 (two) times daily.  05/22/14  Yes Historical Provider, MD  Multiple Vitamin (MULTIVITAMIN) tablet Take 1 tablet by mouth daily.   Yes Historical Provider, MD  pilocarpine (PILOCAR) 4 % ophthalmic solution Place 1 drop into both eyes at bedtime. 02/17/14  Yes Historical Provider, MD  Polyethyl Glycol-Propyl Glycol (SYSTANE OP) Apply 1 drop to eye as needed (for dry eye relief).    Yes Historical Provider, MD  Simethicone (GAS-X PO) Take 1 tablet by mouth daily as needed (for gas relief).    Yes Historical Provider, MD  traMADol (ULTRAM) 50 MG tablet Take 50 mg by mouth 2 (two) times daily.   Yes Historical Provider, MD  Vitamin D, Ergocalciferol, (DRISDOL) 50000 UNITS CAPS capsule Take 50,000 Units by mouth every 30 (thirty) days.    Yes Historical Provider, MD  meclizine (ANTIVERT) 25 MG tablet Take 12.5 mg by mouth 3 (three) times daily as needed for dizziness.     Historical Provider, MD   Physical Exam: Filed Vitals:   12/22/14 1900 12/22/14 2030 12/22/14 2120 12/22/14 2130  BP: 196/75 219/84 211/85 234/85  Pulse: 113 64 69 63  Temp:   98.4 F (36.9 C)   TempSrc:   Oral   Resp:   18   Height:      Weight:      SpO2: 100% 99% 100% 98%    Wt  Readings from Last 3 Encounters:  12/22/14 63.957 kg (141 lb)  12/22/14 65.772 kg (145 lb)  12/16/14 65.772 kg (145 lb)    General:  Appears calm and comfortable Eyes:  PERRL, normal lids, irises. bilat conjunctival injection ENT:  grossly normal hearing, lips & tongue Neck:  no LAD, masses or thyromegaly Cardiovascular:  RRR, III/VI systolic murmur, No LE edema. Respiratory:  CTA bilaterally, no w/r/r. Normal respiratory effort. Abdomen:  soft, ntnd  Skin:  no rash or induration seen on limited exam Musculoskeletal:  UE grossly nml. LE 5/5 strength w/ hip flexion, abduction and adduction.  Psychiatric:  grossly normal mood and affect, speech fluent and appropriate Neurologic:  grossly non-focal.          Labs on Admission:  Basic Metabolic Panel:  Recent Labs Lab 12/16/14 1622 12/22/14 2050  NA 140 143  K 3.9 3.5  CL 106 106  CO2 25 27  GLUCOSE 107* 99  BUN 24* 23*  CREATININE 1.04* 1.02*  CALCIUM 10.0 10.2   Liver Function Tests:  Recent Labs Lab 12/16/14 1622  AST 19  ALT 12*  ALKPHOS 116  BILITOT 0.6  PROT 7.8  ALBUMIN 4.0   No results for input(s): LIPASE, AMYLASE in the last 168 hours. No results for input(s): AMMONIA in the last 168 hours. CBC:  Recent Labs Lab 12/16/14 1622 12/22/14 2015  WBC 9.6 7.1  NEUTROABS 5.7 3.6  HGB 12.2 12.7  HCT 37.9 38.9  MCV 90.7 90.0  PLT 318 430*   Cardiac Enzymes: No results for input(s): CKTOTAL, CKMB, CKMBINDEX, TROPONINI in the last 168 hours.  BNP (last 3 results) No results for input(s): BNP in the last 8760 hours.  ProBNP (last 3 results) No results for input(s): PROBNP in the last 8760 hours.  CBG: No results for input(s): GLUCAP in the last 168 hours.  Radiological Exams on Admission: Mr Lumbar Spine Wo Contrast  12/22/2014   CLINICAL DATA:  Lumbago/low back pain. Cramping in both thighs since Wednesday. Acute onset of lumbago/low back pain 2 days ago. History of prior lumbar surgery and  personal history of breast cancer.  EXAM: MRI LUMBAR SPINE WITHOUT CONTRAST  TECHNIQUE: Multiplanar, multisequence MR imaging of the lumbar spine was performed. No intravenous contrast was administered.  COMPARISON:  02/22/2010 MRI of the lumbar spine.  FINDINGS: Segmentation: Numbering used on prior exam preserved.  Alignment: Mild levoconvex curve of the lumbar spine with the apex at L2-L3.  3 mm of grade I anterolisthesis of L3 on L4 associated with collapse of the disc space and facet arthrosis.  Vertebrae: Ankylosis of L4-L5, presumably postsurgical and degenerative. Severe degenerative endplate changes are present, most pronounced at L3-L4. Discogenic endplate edema is present throughout the thoracolumbar spine. The endplate edema has progressed compared to the prior MRI from 2011.  Conus medullaris: Normal termination at T12.  Paraspinal tissues: LEFT retroperitoneal cyst is compatible with a simple renal cyst. This has probably enlarged compared to the prior exam from 2011.  Mild biliary ductal dilation is present, partially visible. This is most commonly associated with cholecystectomy.  Disc levels:  Disc Signal: Ossification of the L4-L5 disc. Diffuse disc desiccation. Disc degeneration has progressed most severely at L3-L4.  T11-T12: Severe disc degeneration. There is severe central stenosis which has developed since the prior exam. Cord compression is present with low level cord edema just proximal to the conus medullaris.  T12-L1: Facet arthrosis is present. Minimal disc bulging and disc desiccation but no significant stenosis.  L1-L2: Chronic LEFT central disc extrusion is present with cranial migration of disc material. This produces mild central stenosis. Bilateral facet arthrosis is present. Neural foramina appear adequately patent.  L2-L3: Mild central stenosis associated with broad-based disc bulging, ligamentum flavum redundancy and facet arthrosis. Mild bilateral foraminal stenosis.  L3-L4:  Severe central stenosis with complete effacement of the subarachnoid space and bilateral subarticular stenosis. Although the degenerative disease was severe at the time of the prior exam, this  has progressed. Moderate bilateral foraminal stenosis is also present. Progressive broad-based disc extrusion.  L4-L5: Ankylosis.  LEFT laminotomy mild recurrent central stenosis.  L5-S1: Disc desiccation and degeneration. Broad-based endplate osteophytes. Moderate bilateral foraminal stenosis. Large marginal osteophytes in the lateral region that are similar to the prior exam. When comparing to prior, disc degeneration has progressed L5-S1 stenosis appears similar.  IMPRESSION: 1. T11-T12 disc protrusion with ligamentum flavum redundancy producing severe central stenosis with cord compression and central cord edema just proximal to the conus medullaris. Expedited surgical consultation recommended. These results will be called to the ordering clinician or representative by the Radiologist Assistant, and communication documented in the PACS or zVision Dashboard. 2. Progressive L3-L4 severe central, subarticular and moderate bilateral foraminal stenosis. 3. L4-L5 ankylosis and LEFT laminotomy. 4. Mild progression of L5-S1 degenerative disease with similar degree of stenosis compared to prior exam from 2011.   Electronically Signed   By: Dereck Ligas M.D.   On: 12/22/2014 15:37     Assessment/Plan Principal Problem:   Spinal cord compression Active Problems:   Anxiety state   Glaucoma   IBS   Spinal stenosis   Essential hypertension    Cord compression and spinal stenosis :  MRI noting T11-T12 cord compression and edema. Patient symptomatic and getting worse over the last 2 weeks. History of ruptured disks requiring surgery to her lumbar spine in 1987 and 1988. Case discussed by ED physician with Dr.Elsner who recommends transfer to [facility] and neurosurgical consult. Patient is pain-free when laying  still in bed. - MEd SUrge -  Follow-up neurosurgical recommendations.  Possible surgery versus conservative management and outpatient follow-up. -  Coags,  EKG -  Nothing by mouth after midnight -  PT/OT  - Contionue home Tramadol, flexeril prn pain - Tylenol 1gm TID - on Heparin for DVT prophylaxis  HTN: elevation likely from pain.  -  Continue Norvasc, lisinopril, - Hydralazine PRN SBP >180 - Cont ASA  Glaucoma:  -  Continue home Alphagan , Cosopt , Xalatan  ANxiety: - continue Xanax  Code Status: FULL DVT Prophylaxis: Hep Family Communication: Son Disposition Plan: Pending improvement   Elyanah Farino Lenna Sciara, MD Family Medicine [facility] [email or web address] Password [email or web address]

## 2014-12-22 NOTE — ED Notes (Signed)
Patient reports bilateral leg "spasms" x 1 week, seen here last week for same. Went to see Dr. Legrand Rams today and had a MRI and was told to come here for eval. Patient reports back pain now also.

## 2014-12-22 NOTE — ED Provider Notes (Signed)
CSN: [id number]     Arrival date & time 12/22/14  1708 History   First MD Initiated Contact with Patient 12/22/14 1830     Chief Complaint  Patient presents with  . Back Pain      HPI Pt was seen at 1845. Per pt, c/o gradual onset and worsening of persistent bilat thighs and calves "cramping" for the past 2 weeks. Pt states she has now developed low back pain over the past 3 days. Pt states per pain worsens with weight bearing and walking. States she has been walking with a walker shorter and shorter distances over the past 2 weeks due to her symptoms (this is not pt's baseline per family at bedside). Pt states she "is just fine" when she is laying down or sitting. Denies falls, no abd pain, no focal motor weakness, no tingling/numbness in extremities, no ataxia, no slurred speech, no facial droop.    Neurosurgery: Dr. Joya Salm Past Medical History  Diagnosis Date  . Hyperlipidemia   . Hypertension   . Mitral regurgitation     sinus bradycardia  . Breast pain     leftr  . Venous insufficiency   . Allergic rhinitis   . Seborrheic keratosis   . Breast mass     left  . Vertigo   . Carpal tunnel syndrome   . Cataract   . Glaucoma   . Constipation   . IBS (irritable bowel syndrome)   . Osteopenia   . Osteoarthritis   . Low back pain   . Breast cancer   . Anxiety   . Vitamin D deficiency   . Bradycardia   . Carotid atherosclerosis   . Rectocele 06/30/2014  . Cystocele 06/30/2014   Past Surgical History  Procedure Laterality Date  . Appendectomy    . Cholecystectomy    . Breast lumpectomy    . Keloid      left ear  . Eye surgery      for bleed in left eye  . Cyst removed from left breast    . Spurs on back    . Back surgery    . Abdominal hysterectomy     Family History  Problem Relation Age of Onset  . Heart disease Mother   . Hypertension Mother   . Diabetes Mother   . Other Daughter     left breast nodule  . Hypertension Son   . Hyperlipidemia Son   . Heart  disease Maternal Grandmother   . Hypertension Maternal Grandmother   . Hypertension Son   . Other Son     boils   History  Substance Use Topics  . Smoking status: Never Smoker   . Smokeless tobacco: Never Used  . Alcohol Use: No   OB History    Gravida Para Term Preterm AB TAB SAB Ectopic Multiple Living   5 5        5      Review of Systems ROS: Statement: All systems negative except as marked or noted in the HPI; Constitutional: Negative for fever and chills. ; ; Eyes: Negative for eye pain, redness and discharge. ; ; ENMT: Negative for ear pain, hoarseness, nasal congestion, sinus pressure and sore throat. ; ; Cardiovascular: Negative for chest pain, palpitations, diaphoresis, dyspnea and peripheral edema. ; ; Respiratory: Negative for cough, wheezing and stridor. ; ; Gastrointestinal: Negative for nausea, vomiting, diarrhea, abdominal pain, blood in stool, hematemesis, jaundice and rectal bleeding. . ; ; Genitourinary: Negative for dysuria, flank pain  and hematuria. ; ; Musculoskeletal: +LBP. Negative for neck pain. Negative for swelling and trauma.; ; Skin: Negative for pruritus, rash, abrasions, blisters, bruising and skin lesion.; ; Neuro: +legs weakness and "spasms." Negative for headache, lightheadedness and neck stiffness. Negative for altered level of consciousness , altered mental status, paresthesias, involuntary movement, seizure and syncope.      Allergies  Penicillins  Home Medications   Prior to Admission medications   Medication Sig Start Date End Date Taking? Authorizing Provider  ALPRAZolam (XANAX) 0.25 MG tablet Take 0.125-0.25 mg by mouth 2 (two) times daily as needed for anxiety.    Historical Provider, MD  amLODipine (NORVASC) 5 MG tablet Take 5 mg by mouth daily.    Historical Provider, MD  aspirin EC 81 MG tablet Take 81 mg by mouth every morning.     Historical Provider, MD  brimonidine (ALPHAGAN) 0.15 % ophthalmic solution Place 1 drop into both eyes 2 (two)  times daily.     Historical Provider, MD  cyclobenzaprine (FLEXERIL) 5 MG tablet Take 1 tablet (5 mg total) by mouth 2 (two) times daily as needed for muscle spasms. 12/16/14   Nat Christen, MD  diphenhydrAMINE (BENADRYL) 25 mg capsule Take 25 mg by mouth at bedtime as needed for allergies.     Historical Provider, MD  docusate sodium (COLACE) 100 MG capsule Take 100 mg by mouth daily as needed (constipation).    Historical Provider, MD  dorzolamide-timolol (COSOPT) 22.3-6.8 MG/ML ophthalmic solution 1 drop 2 (two) times daily.      Historical Provider, MD  gemfibrozil (LOPID) 600 MG tablet Take 600 mg by mouth 2 (two) times daily before a meal.  02/25/13   Historical Provider, MD  latanoprost (XALATAN) 0.005 % ophthalmic solution Place 1 drop into both eyes at bedtime.    Historical Provider, MD  lisinopril (PRINIVIL,ZESTRIL) 10 MG tablet 10 mg 2 (two) times daily.  05/22/14   Historical Provider, MD  meclizine (ANTIVERT) 25 MG tablet Take 12.5 mg by mouth 3 (three) times daily as needed for dizziness.     Historical Provider, MD  Multiple Vitamin (MULTIVITAMIN) tablet Take 1 tablet by mouth daily.    Historical Provider, MD  pilocarpine (PILOCAR) 4 % ophthalmic solution Place 1 drop into both eyes at bedtime. 02/17/14   Historical Provider, MD  Polyethyl Glycol-Propyl Glycol (SYSTANE OP) Apply 1 drop to eye as needed (for dry eye relief).     Historical Provider, MD  Simethicone (GAS-X PO) Take by mouth as needed.    Historical Provider, MD  traMADol (ULTRAM) 50 MG tablet Take 50 mg by mouth 2 (two) times daily.    Historical Provider, MD  Vitamin D, Ergocalciferol, (DRISDOL) 50000 UNITS CAPS capsule Take 50,000 Units by mouth every 30 (thirty) days.     Historical Provider, MD   BP 179/77 mmHg  Pulse 66  Temp(Src) 99.4 F (37.4 C) (Oral)  Resp 15  Ht 5' 4.5" (1.638 m)  Wt 141 lb (63.957 kg)  BMI 23.84 kg/m2  SpO2 100% Physical Exam  1850: Physical examination:  Nursing notes reviewed; Vital  signs and O2 SAT reviewed;  Constitutional: Well developed, Well nourished, Well hydrated, In no acute distress; Head:  Normocephalic, atraumatic; Eyes: EOMI, PERRL, No scleral icterus; ENMT: Mouth and pharynx normal, Mucous membranes moist; Neck: Supple, Full range of motion, No lymphadenopathy; Cardiovascular: Regular rate and rhythm, No gallop; Respiratory: Breath sounds clear & equal bilaterally, No wheezes.  Speaking full sentences with ease, Normal respiratory effort/excursion; Chest:  Nontender, Movement normal; Abdomen: Soft, Nontender, Nondistended, Normal bowel sounds; Genitourinary: No CVA tenderness; Spine:  No midline CS, TS, tenderness.  +midline LS tenderness to palp.;; Extremities: Peripheral pulses palpable. Feet w/d/good color, No tenderness, No edema, No calf edema or asymmetry.; Neuro: AA&Ox3, Major CN grossly intact. Speech clear.  No facial droop. Grips equal. Strength 5/5 equal bilat UE's; strength 4/5 bilat LE's.  DTR 2/4 bilat UE's and 1/4 bilat LE's.  No gross sensory deficits..; Skin: Color normal, Warm, Dry.   ED Course  Procedures      EKG Interpretation None      MDM  MDM Reviewed: previous chart, nursing note and vitals Reviewed previous: MRI    Mr Lumbar Spine Wo Contrast 12/22/2014   CLINICAL DATA:  Lumbago/low back pain. Cramping in both thighs since Wednesday. Acute onset of lumbago/low back pain 2 days ago. History of prior lumbar surgery and personal history of breast cancer.  EXAM: MRI LUMBAR SPINE WITHOUT CONTRAST  TECHNIQUE: Multiplanar, multisequence MR imaging of the lumbar spine was performed. No intravenous contrast was administered.  COMPARISON:  02/22/2010 MRI of the lumbar spine.  FINDINGS: Segmentation: Numbering used on prior exam preserved.  Alignment: Mild levoconvex curve of the lumbar spine with the apex at L2-L3.  3 mm of grade I anterolisthesis of L3 on L4 associated with collapse of the disc space and facet arthrosis.  Vertebrae: Ankylosis of  L4-L5, presumably postsurgical and degenerative. Severe degenerative endplate changes are present, most pronounced at L3-L4. Discogenic endplate edema is present throughout the thoracolumbar spine. The endplate edema has progressed compared to the prior MRI from 2011.  Conus medullaris: Normal termination at T12.  Paraspinal tissues: LEFT retroperitoneal cyst is compatible with a simple renal cyst. This has probably enlarged compared to the prior exam from 2011.  Mild biliary ductal dilation is present, partially visible. This is most commonly associated with cholecystectomy.  Disc levels:  Disc Signal: Ossification of the L4-L5 disc. Diffuse disc desiccation. Disc degeneration has progressed most severely at L3-L4.  T11-T12: Severe disc degeneration. There is severe central stenosis which has developed since the prior exam. Cord compression is present with low level cord edema just proximal to the conus medullaris.  T12-L1: Facet arthrosis is present. Minimal disc bulging and disc desiccation but no significant stenosis.  L1-L2: Chronic LEFT central disc extrusion is present with cranial migration of disc material. This produces mild central stenosis. Bilateral facet arthrosis is present. Neural foramina appear adequately patent.  L2-L3: Mild central stenosis associated with broad-based disc bulging, ligamentum flavum redundancy and facet arthrosis. Mild bilateral foraminal stenosis.  L3-L4: Severe central stenosis with complete effacement of the subarachnoid space and bilateral subarticular stenosis. Although the degenerative disease was severe at the time of the prior exam, this has progressed. Moderate bilateral foraminal stenosis is also present. Progressive broad-based disc extrusion.  L4-L5: Ankylosis.  LEFT laminotomy mild recurrent central stenosis.  L5-S1: Disc desiccation and degeneration. Broad-based endplate osteophytes. Moderate bilateral foraminal stenosis. Large marginal osteophytes in the lateral  region that are similar to the prior exam. When comparing to prior, disc degeneration has progressed L5-S1 stenosis appears similar.  IMPRESSION: 1. T11-T12 disc protrusion with ligamentum flavum redundancy producing severe central stenosis with cord compression and central cord edema just proximal to the conus medullaris. Expedited surgical consultation recommended. These results will be called to the ordering clinician or representative by the Radiologist Assistant, and communication documented in the PACS or zVision Dashboard. 2. Progressive L3-L4 severe central, subarticular and  moderate bilateral foraminal stenosis. 3. L4-L5 ankylosis and LEFT laminotomy. 4. Mild progression of L5-S1 degenerative disease with similar degree of stenosis compared to prior exam from 2011.   Electronically Signed   By: Dereck Ligas M.D.   On: 12/22/2014 15:37   Results for orders placed or performed during the hospital encounter of 12/22/14  CBC with Differential  Result Value Ref Range   WBC 7.1 4.0 - 10.5 K/uL   RBC 4.32 3.87 - 5.11 MIL/uL   Hemoglobin 12.7 12.0 - 15.0 g/dL   HCT 38.9 36.0 - 46.0 %   MCV 90.0 78.0 - 100.0 fL   MCH 29.4 26.0 - 34.0 pg   MCHC 32.6 30.0 - 36.0 g/dL   RDW 13.2 11.5 - 15.5 %   Platelets 430 (H) 150 - 400 K/uL   Neutrophils Relative % 50 43 - 77 %   Neutro Abs 3.6 1.7 - 7.7 K/uL   Lymphocytes Relative 35 12 - 46 %   Lymphs Abs 2.5 0.7 - 4.0 K/uL   Monocytes Relative 11 3 - 12 %   Monocytes Absolute 0.8 0.1 - 1.0 K/uL   Eosinophils Relative 3 0 - 5 %   Eosinophils Absolute 0.2 0.0 - 0.7 K/uL   Basophils Relative 1 0 - 1 %   Basophils Absolute 0.0 0.0 - 0.1 K/uL  Basic metabolic panel  Result Value Ref Range   Sodium 143 135 - 145 mmol/L   Potassium 3.5 3.5 - 5.1 mmol/L   Chloride 106 101 - 111 mmol/L   CO2 27 22 - 32 mmol/L   Glucose, Bld 99 65 - 99 mg/dL   BUN 23 (H) 6 - 20 mg/dL   Creatinine, Ser 1.02 (H) 0.44 - 1.00 mg/dL   Calcium 10.2 8.9 - 10.3 mg/dL   GFR calc  non Af Amer 49 (L) >60 mL/min   GFR calc Af Amer 57 (L) >60 mL/min   Anion gap 10 5 - 15     1940:  T/C to [facility] Neurosurgery Dr. Ellene Route, case discussed, including:  HPI, pertinent PM/SHx, VS/PE, dx testing, ED course and treatment:  He has viewed the MRI images, states pt will need to go to OR but not tonight, agreeable to consult, requests to transfer to [facility] to medicine service.  Will obtain labs and then call [facility] to facilitate transfer/admit.   2140:  Remains symptom-free while sitting on stretcher. BUN/Cr per baseline. Dx and testing, as well as Neurosurgeon, d/w pt and family.  Questions answered.  Verb understanding, agreeable to transfer to [facility] for admit. T/C to [facility] Dr. Marily Memos, case discussed, including:  HPI, pertinent PM/SHx, VS/PE, dx testing, ED course and treatment:  Agreeable to facilitate transfer to [facility] for admission, requests to write temporary orders, obtain medical bed to team [facility]/Dr. Alcario Drought.   Francine Graven, DO 12/24/14 1305

## 2014-12-23 ENCOUNTER — Other Ambulatory Visit: Payer: Self-pay | Admitting: Neurological Surgery

## 2014-12-23 DIAGNOSIS — M4804 Spinal stenosis, thoracic region: Secondary | ICD-10-CM | POA: Insufficient documentation

## 2014-12-23 LAB — GLUCOSE, CAPILLARY
GLUCOSE-CAPILLARY: 116 mg/dL — AB (ref 65–99)
GLUCOSE-CAPILLARY: 95 mg/dL (ref 65–99)

## 2014-12-23 MED ORDER — ALUM & MAG HYDROXIDE-SIMETH 200-200-20 MG/5ML PO SUSP: 30.0000 mL | ORAL | Status: DC | PRN

## 2014-12-23 MED ORDER — DEXAMETHASONE 2 MG PO TABS
2.0000 mg | ORAL_TABLET | Freq: Three times a day (TID) | ORAL | Status: DC
  Administered 2014-12-23 – 2014-12-25 (×5): 2 mg via ORAL
  Filled 2014-12-23 (×6): qty 1

## 2014-12-23 MED ORDER — INSULIN ASPART 100 UNIT/ML ~~LOC~~ SOLN
0.0000 [IU] | Freq: Three times a day (TID) | SUBCUTANEOUS | Status: DC
  Administered 2014-12-24 – 2014-12-26 (×2): 1 [IU] via SUBCUTANEOUS
  Administered 2014-12-29: 2 [IU] via SUBCUTANEOUS

## 2014-12-23 MED ORDER — AMLODIPINE BESYLATE 10 MG PO TABS
10.0000 mg | ORAL_TABLET | Freq: Every day | ORAL | Status: DC
  Administered 2014-12-23 – 2014-12-31 (×9): 10 mg via ORAL
  Filled 2014-12-23 (×9): qty 1

## 2014-12-23 MED ORDER — FAMOTIDINE 20 MG PO TABS
20.0000 mg | ORAL_TABLET | Freq: Two times a day (BID) | ORAL | Status: DC
  Administered 2014-12-23 – 2014-12-31 (×17): 20 mg via ORAL
  Filled 2014-12-23 (×17): qty 1

## 2014-12-23 MED ORDER — DEXAMETHASONE SODIUM PHOSPHATE 4 MG/ML IJ SOLN
4.0000 mg | Freq: Four times a day (QID) | INTRAMUSCULAR | Status: DC
  Administered 2014-12-23: 4 mg via INTRAVENOUS
  Filled 2014-12-23: qty 1

## 2014-12-23 MED ORDER — POTASSIUM CHLORIDE CRYS ER 20 MEQ PO TBCR
40.0000 meq | EXTENDED_RELEASE_TABLET | Freq: Once | ORAL | Status: AC
  Administered 2014-12-23: 40 meq via ORAL
  Filled 2014-12-23: qty 2

## 2014-12-23 MED ORDER — LISINOPRIL 20 MG PO TABS
40.0000 mg | ORAL_TABLET | Freq: Every day | ORAL | Status: DC
  Administered 2014-12-24 – 2014-12-31 (×8): 40 mg via ORAL
  Filled 2014-12-23 (×3): qty 2
  Filled 2014-12-23: qty 4
  Filled 2014-12-23 (×4): qty 2

## 2014-12-23 MED ORDER — BRIMONIDINE TARTRATE 0.2 % OP SOLN
1.0000 [drp] | Freq: Two times a day (BID) | OPHTHALMIC | Status: DC
  Administered 2014-12-23 – 2014-12-31 (×17): 1 [drp] via OPHTHALMIC
  Filled 2014-12-23: qty 5

## 2014-12-23 MED ORDER — LISINOPRIL 20 MG PO TABS
20.0000 mg | ORAL_TABLET | Freq: Every day | ORAL | Status: DC
  Administered 2014-12-23: 20 mg via ORAL
  Filled 2014-12-23: qty 1

## 2014-12-23 NOTE — Evaluation (Signed)
Physical Therapy Evaluation Patient Details Name: ANIKKA MARSAN MRN: [id number] DOB: 1929/09/15 Today's Date: 12/23/2014   History of Present Illness  81 y.o. female admitted with severe stenosis with myelopathy at T11-T12/severe stenosis L3-L4 secondary to spondylosis/cord compression.  Clinical Impression  Pt admitted with the above diagnosis. Pt currently with functional limitations due to the deficits listed below (see PT Problem List). Ambulates slowly up to 30 feet with min assist for balance while using a rolling walker, demonstrating notable footdrop bilaterally. MMT of ankle dorsiflexors 2/5 Lt, 3/5 Rt. Pt will benefit from skilled PT to increase their independence and safety with mobility to allow discharge to the venue listed below.       Follow Up Recommendations Other (comment) (Pending progression and possible surgery)    Equipment Recommendations  None recommended by PT    Recommendations for Other Services       Precautions / Restrictions Precautions Precautions: Fall Restrictions Weight Bearing Restrictions: No      Mobility  Bed Mobility Overal bed mobility: Needs Assistance Bed Mobility: Rolling;Sidelying to Sit Rolling: Min guard Sidelying to sit: Min guard       General bed mobility comments: Educated on log roll technique for comfort. Pt able to perform slowly without assistance and VC for technique throughout. Encouraged use of rail as needed.  Transfers Overall transfer level: Needs assistance Equipment used: Rolling walker (2 wheeled) Transfers: Sit to/from Stand Sit to Stand: Min assist         General transfer comment: Min assist for boost to stand from lowest bed setting. VC for hand placement.  Ambulation/Gait Ambulation/Gait assistance: Min assist Ambulation Distance (Feet): 30 Feet Assistive device: Rolling walker (2 wheeled) Gait Pattern/deviations: Step-to pattern;Decreased step length - right;Decreased step length - left;Decreased  stride length;Decreased dorsiflexion - right;Decreased dorsiflexion - left;Ataxic;Trunk flexed Gait velocity: slow Gait velocity interpretation: Below normal speed for age/gender General Gait Details: Educated on safe DME use with a rolling walker. Relies on this devices moderately. Min assist for balance intermittently and for walker placement. Decreased dorsiflexion, with some improvement following cues for heel strike awareness.  Stairs            Wheelchair Mobility    Modified Rankin (Stroke Patients Only)       Balance Overall balance assessment: Needs assistance Sitting-balance support: No upper extremity supported;Feet supported Sitting balance-Leahy Scale: Fair     Standing balance support: Bilateral upper extremity supported Standing balance-Leahy Scale: Poor                               Pertinent Vitals/Pain Pain Assessment: No/denies pain    Home Living Family/patient expects to be discharged to:: Private residence Living Arrangements: Children Available Help at Discharge: Family;Available 24 hours/day (son works nights but can find supervision at night) Type of Home: House Home Access: Level entry     [place]: One [place]: [place] - single point;Walker - standard      Prior Function Level of Independence: Independent with assistive device(s)         Comments: cane for mobility     Hand Dominance   Dominant Hand: Right    Extremity/Trunk Assessment   Upper Extremity Assessment: Defer to OT evaluation           Lower Extremity Assessment: RLE deficits/detail;LLE deficits/detail RLE Deficits / Details: Grossly 4/5 strength throughout with the exception of ankle dorsiflexion 3/5 and hallux extension 3+/5  LLE Deficits / Details: Grossly 4/5 strength throughout with the exception of ankle dorsiflexion 2/5 and hallux extension 3+/5     Communication   Communication: No difficulties  Cognition Arousal/Alertness:  Awake/alert Behavior During Therapy: WFL for tasks assessed/performed Overall Cognitive Status: Within Functional Limits for tasks assessed                      General Comments General comments (skin integrity, edema, etc.): Discussed role of PT, progression of activity, options for d/c and recovery depending whether or not pt has surgery.    Exercises General Exercises - Lower Extremity Ankle Circles/Pumps: AROM;Strengthening;Both;10 reps;Seated      Assessment/Plan    PT Assessment Patient needs continued PT services  PT Diagnosis Difficulty walking;Abnormality of gait;Generalized weakness   PT Problem List Decreased strength;Decreased range of motion;Decreased activity tolerance;Decreased balance;Decreased mobility;Decreased coordination;Decreased knowledge of use of DME  PT Treatment Interventions DME instruction;Gait training;Functional mobility training;Therapeutic activities;Therapeutic exercise;Balance training;Neuromuscular re-education;Patient/family education   PT Goals (Current goals can be found in the Care Plan section) Acute Rehab PT Goals Patient Stated Goal: Decide if I will have surgery or not PT Goal Formulation: With patient Time For Goal Achievement: 01/06/15 Potential to Achieve Goals: Good (If pt has surgery)    Frequency Min 3X/week   Barriers to discharge        Co-evaluation               End of Session Equipment Utilized During Treatment: Gait belt Activity Tolerance: Patient tolerated treatment well Patient left: in chair;with call bell/phone within reach Nurse Communication: Mobility status         Time: 4540-9811 PT Time Calculation (min) (ACUTE ONLY): 24 min   Charges:   PT Evaluation $Initial PT Evaluation Tier I: 1 Procedure PT Treatments $Gait Training: 8-22 mins   PT G CodesEllouise Newer 12/23/2014, 12:02 PM  Elayne Snare, [place]

## 2014-12-23 NOTE — Progress Notes (Signed)
Patient arrived from [place] at [place], alert and oriented. Patient oriented to room and equipment. All safety measures in place. Will monitor closely overnight.

## 2014-12-23 NOTE — Progress Notes (Signed)
OT Cancellation Note  Patient Details Name: Jodi Peters MRN: [id number] DOB: 11-03-1929   Cancelled Treatment:    Reason Eval/Treat Not Completed: Medical issues which prohibited therapy;Other (comment) (bed rest order) Pt under bed rest order. Will reattempt OT evaluation when pt no longer on bed rest.  [name] 12/23/2014, 8:45 AM

## 2014-12-23 NOTE — Progress Notes (Signed)
PT Cancellation Note  Patient Details Name: Jodi Peters MRN: [id number] DOB: 22-Jan-1930   Cancelled Treatment:    Reason Eval/Treat Not Completed: Patient not medically ready Patient remains on bed rest. Will follow up when medically ready for physical therapy evaluation.  [name] 12/23/2014, 8:36 AM Elayne Snare, [place]

## 2014-12-23 NOTE — Progress Notes (Signed)
[facility] PROGRESS NOTE  Jodi Peters SWH:[id number] DOB: [birth year] DOA: 12/22/2014 PCP: Rosita Fire, MD  Assessment/Plan: #1 severe stenosis with myelopathy at T11-T12/severe stenosis L3-L4 secondary to spondylosis/cord compression Patient with central disc herniation and moderate spondylosis causing severe stenosis with myelopathy at T11-T12. Patient with worsening gait with difficulty ambulating. Patient has been evaluated by neurosurgery who are recommending surgical decompression at T11-T12 and also at L3-L4. Patient currently on Decadron. PT/OT. Patient tentatively scheduled for possible surgery this Friday. Neurosurgery. Patient to discuss with her family. Continue pain management. Per neurosurgery.  #2 hypertension Continue current recommendation of Norvasc and lisinopril. Hydralazine when necessary. On aspirin.  #3 glaucoma Continue eyedrops of Alphagan, Cosopt, xalatan.  #4 anxiety Stable. Continue Xanax.  #5 GERD Continue Pepcid.  #6 prophylaxis Pepcid for GI prophylaxis. Heparin for DVT prophylaxis.  Code Status: Full Family Communication: updated patient. No family present. Disposition Plan: Remain inpatient pending probable surgery.   Consultants:  Nuerosurgery: Dr Ellene Route 12/23/14  Procedures:  MRI L spine 12/22/14  Antibiotics:  None  HPI/Subjective: Patient denies SOB. Patient states some chest discomfort with IV decadron. Patient denies dysuria.  Objective: Filed Vitals:   12/23/14 0537  BP: 183/72  Pulse: 96  Temp: 98.4 F (36.9 C)  Resp: 18    Intake/Output Summary (Last 24 hours) at 12/23/14 1133 Last data filed at 12/23/14 0540  Gross per 24 hour  Intake      0 ml  Output      1 ml  Net     -1 ml   Filed Weights   12/22/14 1723  Weight: 63.957 kg (141 lb)    Exam:   General:  NAD  Cardiovascular: RRR  Respiratory: CTAB  Abdomen: Soft/NT/ND/+BS  Musculoskeletal: No c/c/e  Data Reviewed: Basic Metabolic  Panel:  Recent Labs Lab 12/16/14 1622 12/22/14 2050  NA 140 143  K 3.9 3.5  CL 106 106  CO2 25 27  GLUCOSE 107* 99  BUN 24* 23*  CREATININE 1.04* 1.02*  CALCIUM 10.0 10.2   Liver Function Tests:  Recent Labs Lab 12/16/14 1622  AST 19  ALT 12*  ALKPHOS 116  BILITOT 0.6  PROT 7.8  ALBUMIN 4.0   No results for input(s): LIPASE, AMYLASE in the last 168 hours. No results for input(s): AMMONIA in the last 168 hours. CBC:  Recent Labs Lab 12/16/14 1622 12/22/14 2015  WBC 9.6 7.1  NEUTROABS 5.7 3.6  HGB 12.2 12.7  HCT 37.9 38.9  MCV 90.7 90.0  PLT 318 430*   Cardiac Enzymes: No results for input(s): CKTOTAL, CKMB, CKMBINDEX, TROPONINI in the last 168 hours. BNP (last 3 results) No results for input(s): BNP in the last 8760 hours.  ProBNP (last 3 results) No results for input(s): PROBNP in the last 8760 hours.  CBG: No results for input(s): GLUCAP in the last 168 hours.  No results found for this or any previous visit (from the past 240 hour(s)).   Studies: Mr Lumbar Spine Wo Contrast  12/22/2014   CLINICAL DATA:  Lumbago/low back pain. Cramping in both thighs since Wednesday. Acute onset of lumbago/low back pain 2 days ago. History of prior lumbar surgery and personal history of breast cancer.  EXAM: MRI LUMBAR SPINE WITHOUT CONTRAST  TECHNIQUE: Multiplanar, multisequence MR imaging of the lumbar spine was performed. No intravenous contrast was administered.  COMPARISON:  02/22/2010 MRI of the lumbar spine.  FINDINGS: Segmentation: Numbering used on prior exam preserved.  Alignment: Mild levoconvex curve of the lumbar  spine with the apex at L2-L3.  3 mm of grade I anterolisthesis of L3 on L4 associated with collapse of the disc space and facet arthrosis.  Vertebrae: Ankylosis of L4-L5, presumably postsurgical and degenerative. Severe degenerative endplate changes are present, most pronounced at L3-L4. Discogenic endplate edema is present throughout the thoracolumbar  spine. The endplate edema has progressed compared to the prior MRI from 2011.  Conus medullaris: Normal termination at T12.  Paraspinal tissues: LEFT retroperitoneal cyst is compatible with a simple renal cyst. This has probably enlarged compared to the prior exam from 2011.  Mild biliary ductal dilation is present, partially visible. This is most commonly associated with cholecystectomy.  Disc levels:  Disc Signal: Ossification of the L4-L5 disc. Diffuse disc desiccation. Disc degeneration has progressed most severely at L3-L4.  T11-T12: Severe disc degeneration. There is severe central stenosis which has developed since the prior exam. Cord compression is present with low level cord edema just proximal to the conus medullaris.  T12-L1: Facet arthrosis is present. Minimal disc bulging and disc desiccation but no significant stenosis.  L1-L2: Chronic LEFT central disc extrusion is present with cranial migration of disc material. This produces mild central stenosis. Bilateral facet arthrosis is present. Neural foramina appear adequately patent.  L2-L3: Mild central stenosis associated with broad-based disc bulging, ligamentum flavum redundancy and facet arthrosis. Mild bilateral foraminal stenosis.  L3-L4: Severe central stenosis with complete effacement of the subarachnoid space and bilateral subarticular stenosis. Although the degenerative disease was severe at the time of the prior exam, this has progressed. Moderate bilateral foraminal stenosis is also present. Progressive broad-based disc extrusion.  L4-L5: Ankylosis.  LEFT laminotomy mild recurrent central stenosis.  L5-S1: Disc desiccation and degeneration. Broad-based endplate osteophytes. Moderate bilateral foraminal stenosis. Large marginal osteophytes in the lateral region that are similar to the prior exam. When comparing to prior, disc degeneration has progressed L5-S1 stenosis appears similar.  IMPRESSION: 1. T11-T12 disc protrusion with ligamentum  flavum redundancy producing severe central stenosis with cord compression and central cord edema just proximal to the conus medullaris. Expedited surgical consultation recommended. These results will be called to the ordering clinician or representative by the Radiologist Assistant, and communication documented in the PACS or zVision Dashboard. 2. Progressive L3-L4 severe central, subarticular and moderate bilateral foraminal stenosis. 3. L4-L5 ankylosis and LEFT laminotomy. 4. Mild progression of L5-S1 degenerative disease with similar degree of stenosis compared to prior exam from 2011.   Electronically Signed   By: Dereck Ligas M.D.   On: 12/22/2014 15:37    Scheduled Meds: . acetaminophen  1,000 mg Oral 3 times per day  . amLODipine  10 mg Oral Daily  . brimonidine  1 drop Both Eyes BID  . dexamethasone  2 mg Oral 3 times per day  . dorzolamide-timolol  1 drop Both Eyes BID  . famotidine  20 mg Oral BID  . gemfibrozil  600 mg Oral BID AC  . heparin  5,000 Units Subcutaneous 3 times per day  . latanoprost  1 drop Both Eyes QHS  . lisinopril  20 mg Oral Daily  . pilocarpine  1 drop Both Eyes QHS  . potassium chloride  40 mEq Oral Once  . traMADol  50 mg Oral BID   Continuous Infusions:   Principal Problem:   Spinal cord compression Active Problems:   Anxiety state   Glaucoma   IBS   Spinal stenosis   Essential hypertension    Time spent: 35 mins  [facility] MD [facility] Pager [phone]. If 7PM-7AM, please contact night-coverage at [email or web address], password [facility] 12/23/2014, 11:33 AM  LOS: 1 day

## 2014-12-23 NOTE — Consult Note (Signed)
Reason for Consult: Lumbar spinal stenosis with myelopathy Referring Physician: Dr. Irine Seal  Jodi Peters is an 81 y.o. female.  HPI: Patient is an 81 year old individual who has previously had surgery in 1987 and 1988 by Dr. Leeroy Cha. She notes that she did well after that surgery. Recently she is developed significant problems with pain and weakness in her lower extremities. Specifically she notes that she's had substantial cramping in her thighs. This is prevented her from walking even short distances. She was so incapacitated yesterday that she presented to the emergency room after an MRI was ordered by Dr. Lenard Simmer her primary care physician. The MRI scan demonstrated the presence of a centrally herniated disc at T11-T12 with cord signal changes in the region of the conus. In addition there is an area of severe stenosis at L3-L4. It appears that the patient has formed a fusion at L4-L5 were she's had previous surgery years ago. She is now seen for consideration of surgical intervention.  Past Medical History  Diagnosis Date  . Hyperlipidemia   . Hypertension   . Mitral regurgitation     sinus bradycardia  . Breast pain     leftr  . Venous insufficiency   . Allergic rhinitis   . Seborrheic keratosis   . Breast mass     left  . Vertigo   . Carpal tunnel syndrome   . Cataract   . Glaucoma   . Constipation   . IBS (irritable bowel syndrome)   . Osteopenia   . Osteoarthritis   . Low back pain   . Breast cancer   . Anxiety   . Vitamin D deficiency   . Bradycardia   . Carotid atherosclerosis   . Rectocele 06/30/2014  . Cystocele 06/30/2014    Past Surgical History  Procedure Laterality Date  . Appendectomy    . Cholecystectomy    . Breast lumpectomy    . Keloid      left ear  . Eye surgery      for bleed in left eye  . Cyst removed from left breast    . Spurs on back    . Back surgery    . Abdominal hysterectomy      Family History  Problem Relation Age of  Onset  . Heart disease Mother   . Hypertension Mother   . Diabetes Mother   . Other Daughter     left breast nodule  . Hypertension Son   . Hyperlipidemia Son   . Heart disease Maternal Grandmother   . Hypertension Maternal Grandmother   . Hypertension Son   . Other Son     boils    Social History:  reports that she has never smoked. She has never used smokeless tobacco. She reports that she does not drink alcohol or use illicit drugs.  Allergies:  Allergies  Allergen Reactions  . Penicillins Rash    Medications: I have reviewed the patient's current medications.  Results for orders placed or performed during the hospital encounter of 12/22/14 (from the past 48 hour(s))  CBC with Differential     Status: Abnormal   Collection Time: 12/22/14  8:15 PM  Result Value Ref Range   WBC 7.1 4.0 - 10.5 K/uL   RBC 4.32 3.87 - 5.11 MIL/uL   Hemoglobin 12.7 12.0 - 15.0 g/dL   HCT 38.9 36.0 - 46.0 %   MCV 90.0 78.0 - 100.0 fL   MCH 29.4 26.0 - 34.0 pg  MCHC 32.6 30.0 - 36.0 g/dL   RDW 13.2 11.5 - 15.5 %   Platelets 430 (H) 150 - 400 K/uL   Neutrophils Relative % 50 43 - 77 %   Neutro Abs 3.6 1.7 - 7.7 K/uL   Lymphocytes Relative 35 12 - 46 %   Lymphs Abs 2.5 0.7 - 4.0 K/uL   Monocytes Relative 11 3 - 12 %   Monocytes Absolute 0.8 0.1 - 1.0 K/uL   Eosinophils Relative 3 0 - 5 %   Eosinophils Absolute 0.2 0.0 - 0.7 K/uL   Basophils Relative 1 0 - 1 %   Basophils Absolute 0.0 0.0 - 0.1 K/uL  Basic metabolic panel     Status: Abnormal   Collection Time: 12/22/14  8:50 PM  Result Value Ref Range   Sodium 143 135 - 145 mmol/L   Potassium 3.5 3.5 - 5.1 mmol/L   Chloride 106 101 - 111 mmol/L   CO2 27 22 - 32 mmol/L   Glucose, Bld 99 65 - 99 mg/dL   BUN 23 (H) 6 - 20 mg/dL   Creatinine, Ser 1.02 (H) 0.44 - 1.00 mg/dL   Calcium 10.2 8.9 - 10.3 mg/dL   GFR calc non Af Amer 49 (L) >60 mL/min   GFR calc Af Amer 57 (L) >60 mL/min    Comment: (NOTE) The eGFR has been calculated  using the CKD EPI equation. This calculation has not been validated in all clinical situations. eGFR's persistently <60 mL/min signify possible Chronic Kidney Disease.    Anion gap 10 5 - 15  Urinalysis, Routine w reflex microscopic (not at [facility])     Status: Abnormal   Collection Time: 12/22/14  9:30 PM  Result Value Ref Range   Color, Urine YELLOW YELLOW   APPearance CLEAR CLEAR   Specific Gravity, Urine 1.020 1.005 - 1.030   pH 6.0 5.0 - 8.0   Glucose, UA NEGATIVE NEGATIVE mg/dL   Hgb urine dipstick NEGATIVE NEGATIVE   Bilirubin Urine NEGATIVE NEGATIVE   Ketones, ur NEGATIVE NEGATIVE mg/dL   Protein, ur 30 (A) NEGATIVE mg/dL   Urobilinogen, UA 0.2 0.0 - 1.0 mg/dL   Nitrite NEGATIVE NEGATIVE   Leukocytes, UA TRACE (A) NEGATIVE  Urine microscopic-add on     Status: Abnormal   Collection Time: 12/22/14  9:30 PM  Result Value Ref Range   Squamous Epithelial / LPF RARE RARE   WBC, UA 11-20 <3 WBC/hpf   Bacteria, UA FEW (A) RARE    Mr Lumbar Spine Wo Contrast  12/22/2014   CLINICAL DATA:  Lumbago/low back pain. Cramping in both thighs since Wednesday. Acute onset of lumbago/low back pain 2 days ago. History of prior lumbar surgery and personal history of breast cancer.  EXAM: MRI LUMBAR SPINE WITHOUT CONTRAST  TECHNIQUE: Multiplanar, multisequence MR imaging of the lumbar spine was performed. No intravenous contrast was administered.  COMPARISON:  02/22/2010 MRI of the lumbar spine.  FINDINGS: Segmentation: Numbering used on prior exam preserved.  Alignment: Mild levoconvex curve of the lumbar spine with the apex at L2-L3.  3 mm of grade I anterolisthesis of L3 on L4 associated with collapse of the disc space and facet arthrosis.  Vertebrae: Ankylosis of L4-L5, presumably postsurgical and degenerative. Severe degenerative endplate changes are present, most pronounced at L3-L4. Discogenic endplate edema is present throughout the thoracolumbar spine. The endplate edema has progressed compared  to the prior MRI from 2011.  Conus medullaris: Normal termination at T12.  Paraspinal tissues: LEFT retroperitoneal  cyst is compatible with a simple renal cyst. This has probably enlarged compared to the prior exam from 2011.  Mild biliary ductal dilation is present, partially visible. This is most commonly associated with cholecystectomy.  Disc levels:  Disc Signal: Ossification of the L4-L5 disc. Diffuse disc desiccation. Disc degeneration has progressed most severely at L3-L4.  T11-T12: Severe disc degeneration. There is severe central stenosis which has developed since the prior exam. Cord compression is present with low level cord edema just proximal to the conus medullaris.  T12-L1: Facet arthrosis is present. Minimal disc bulging and disc desiccation but no significant stenosis.  L1-L2: Chronic LEFT central disc extrusion is present with cranial migration of disc material. This produces mild central stenosis. Bilateral facet arthrosis is present. Neural foramina appear adequately patent.  L2-L3: Mild central stenosis associated with broad-based disc bulging, ligamentum flavum redundancy and facet arthrosis. Mild bilateral foraminal stenosis.  L3-L4: Severe central stenosis with complete effacement of the subarachnoid space and bilateral subarticular stenosis. Although the degenerative disease was severe at the time of the prior exam, this has progressed. Moderate bilateral foraminal stenosis is also present. Progressive broad-based disc extrusion.  L4-L5: Ankylosis.  LEFT laminotomy mild recurrent central stenosis.  L5-S1: Disc desiccation and degeneration. Broad-based endplate osteophytes. Moderate bilateral foraminal stenosis. Large marginal osteophytes in the lateral region that are similar to the prior exam. When comparing to prior, disc degeneration has progressed L5-S1 stenosis appears similar.  IMPRESSION: 1. T11-T12 disc protrusion with ligamentum flavum redundancy producing severe central stenosis  with cord compression and central cord edema just proximal to the conus medullaris. Expedited surgical consultation recommended. These results will be called to the ordering clinician or representative by the Radiologist Assistant, and communication documented in the PACS or zVision Dashboard. 2. Progressive L3-L4 severe central, subarticular and moderate bilateral foraminal stenosis. 3. L4-L5 ankylosis and LEFT laminotomy. 4. Mild progression of L5-S1 degenerative disease with similar degree of stenosis compared to prior exam from 2011.   Electronically Signed   By: Dereck Ligas M.D.   On: 12/22/2014 15:37    Review of Systems  HENT: Negative.   Eyes: Negative.   Respiratory: Negative.   Cardiovascular: Negative.   Gastrointestinal: Negative.   Genitourinary: Negative.   Musculoskeletal: Positive for back pain.  Skin: Negative.   Neurological: Positive for weakness.       Eanes of leg pain and weakness with cramping in both thighs  Psychiatric/Behavioral: Negative.    Blood pressure 183/72, pulse 96, temperature 98.4 F (36.9 C), temperature source Oral, resp. rate 18, height 5' 4.5" (1.638 m), weight 63.957 kg (141 lb), SpO2 100 %. Physical Exam  Constitutional: She is oriented to person, place, and time. She appears well-developed and well-nourished.  HENT:  Head: Normocephalic.  Left ear with cauliflower deformity  Eyes: Conjunctivae and EOM are normal. Pupils are equal, round, and reactive to light.  Neck: Normal range of motion. Neck supple.  Cardiovascular: Normal rate and regular rhythm.   Respiratory: Effort normal and breath sounds normal.  GI: Soft. Bowel sounds are normal.  Neurological: She is alert and oriented to person, place, and time.  Absent deep tendon reflexes and patellae and Achilles motor strength is 4 out of 5 in iliopsoas quadricep tibialis anterior and gastrocs. Straight leg raising is positive at 30 in either lower extremity for back pain  Skin: Skin is  warm and dry.  Psychiatric: She has a normal mood and affect. Her behavior is normal. Judgment and thought content normal.  Assessment/Plan: Severe stenosis with myelopathy at T11-T12 secondary to central disc herniation and moderate spondylosis. Severe stenosis L3-L4 secondary to spondylosis. Status post decompression L4-L5 with spontaneous arthrodesis of that joint.  Given the 2 areas of stenosis that are quite severe and advanced and particularly the presence of cord signal changes in the conus believe the patient should undergo surgical decompression at T11-T12 via a posterior laminectomy and also at L3-L4 via bilateral laminotomies. The patient is reluctant to consider surgical intervention however I believe that given the nature and the severity of the compression this needs to be considered strongly given her rather rapid deterioration and function. She is concerned because of her age and the fact that she's had a number of surgeries in the past. I've advised that we can plan the surgery for this coming Friday.  She wishes to discuss the situation with her children. In the meantime we can see if some improvement with IV Decadron which should help with comfort and also physical therapy to help with mobilization. She may eat at this time.  Skylynne Schlechter J 12/23/2014, 9:33 AM

## 2014-12-23 NOTE — Progress Notes (Signed)
Patient's family called RN to room and stated patient informed her the bed is moving side to side and she also feels like the bed is moving forward to the wall. Pharmacy consulted about new medication introduce today and sign and symptoms not noted on the list. Bed alarm set and report given to receiving nurse.

## 2014-12-24 LAB — CBC
HCT: 35.5 % — ABNORMAL LOW (ref 36.0–46.0)
Hemoglobin: 11.3 g/dL — ABNORMAL LOW (ref 12.0–15.0)
MCH: 28.7 pg (ref 26.0–34.0)
MCHC: 31.8 g/dL (ref 30.0–36.0)
MCV: 90.1 fL (ref 78.0–100.0)
Platelets: 421 10*3/uL — ABNORMAL HIGH (ref 150–400)
RBC: 3.94 MIL/uL (ref 3.87–5.11)
RDW: 13.4 % (ref 11.5–15.5)
WBC: 10.2 10*3/uL (ref 4.0–10.5)

## 2014-12-24 LAB — BASIC METABOLIC PANEL
Anion gap: 8 (ref 5–15)
BUN: 26 mg/dL — AB (ref 6–20)
CO2: 22 mmol/L (ref 22–32)
CREATININE: 1.09 mg/dL — AB (ref 0.44–1.00)
Calcium: 10.4 mg/dL — ABNORMAL HIGH (ref 8.9–10.3)
Chloride: 109 mmol/L (ref 101–111)
GFR calc Af Amer: 53 mL/min — ABNORMAL LOW (ref >60)
GFR calc non Af Amer: 46 mL/min — ABNORMAL LOW (ref >60)
Glucose, Bld: 115 mg/dL — ABNORMAL HIGH (ref 65–99)
POTASSIUM: 4.5 mmol/L (ref 3.5–5.1)
Sodium: 139 mmol/L (ref 135–145)

## 2014-12-24 LAB — GLUCOSE, CAPILLARY
GLUCOSE-CAPILLARY: 106 mg/dL — AB (ref 65–99)
Glucose-Capillary: 139 mg/dL — ABNORMAL HIGH (ref 65–99)
Glucose-Capillary: 114 mg/dL — ABNORMAL HIGH (ref 65–99)

## 2014-12-24 MED ORDER — SODIUM CHLORIDE 0.9 % IV SOLN
INTRAVENOUS | Status: DC
  Administered 2014-12-24: 09:00:00 via INTRAVENOUS
  Administered 2014-12-25: 75 mL/h via INTRAVENOUS

## 2014-12-24 NOTE — Progress Notes (Signed)
Patient ID: DAMA HEDGEPETH, female   DOB: [birth year], 81 y.o.   MRN: [id number] Patient feels better on substantial doses of Decadron. She is been able to walk about 30 feet with physical therapy and walker. She's been able to use the bathroom including having a bowel movement. Despite this I described to her the fact that she has significant stenosis in 2 levels of her lumbar spine and at the thoracic lumbar junction. The effects of the steroid-induced will be temporary and she still needs surgical decompression This is being planned for tomorrow.

## 2014-12-24 NOTE — Evaluation (Signed)
Occupational Therapy Evaluation Patient Details Name: Jodi Peters MRN: [id number] DOB: [birth year] Today's Date: 12/24/2014    History of Present Illness 81 y.o. female admitted with severe stenosis with myelopathy at T11-T12/severe stenosis L3-L4 secondary to spondylosis/cord compression.   Clinical Impression  PT admitted with cord compression at T11-T12 and stenosis L3-4. Pt currently with functional limitiations due to the deficits listed below (see OT problem list). PTA mod I for all adls. Pt will benefit from skilled OT to increase their independence and safety with adls and balance to allow discharge home at this time pending possible surg Friday. OT to further assess with reevaluation orders.     Follow Up Recommendations   (TBA after surg )    Equipment Recommendations   (TBA after surgery)    Recommendations for Other Services       Precautions / Restrictions Precautions Precautions: Fall      Mobility Bed Mobility               General bed mobility comments: in chair on arrival  Transfers Overall transfer level: Needs assistance Equipment used: Rolling walker (2 wheeled) Transfers: Sit to/from Stand Sit to Stand: Supervision         General transfer comment: cues once during session for hand placement    Balance Overall balance assessment: Needs assistance         Standing balance support: Bilateral upper extremity supported;During functional activity Standing balance-Leahy Scale: Fair                              ADL Overall ADL's : Modified independent                                       General ADL Comments: Pt gathered all ADL items, ambulated with RW supervision level to bathroom . completed full adl at sink level MOD I     Vision     Perception     Praxis      Pertinent Vitals/Pain       Hand Dominance Right   Extremity/Trunk Assessment Upper Extremity Assessment Upper Extremity  Assessment: Overall WFL for tasks assessed   Lower Extremity Assessment Lower Extremity Assessment: Defer to PT evaluation   Cervical / Trunk Assessment Cervical / Trunk Assessment: Other exceptions (spinal changes noted by DR Ellene Route)   Communication Communication Communication: No difficulties   Cognition Arousal/Alertness: Awake/alert Behavior During Therapy: WFL for tasks assessed/performed Overall Cognitive Status: Within Functional Limits for tasks assessed                     General Comments       Exercises       Shoulder Instructions      Home Living Family/patient expects to be discharged to:: Private residence Living Arrangements: Children Available Help at Discharge: Family;Available 24 hours/day Type of Home: House Home Access: Level entry     Home Layout: One level     Bathroom Shower/Tub:  (sponge bath)   Bathroom Toilet: Standard     Home Equipment: Cane - single point;Walker - standard          Prior Functioning/Environment Level of Independence: Independent with assistive device(s)        Comments: cane for mobility    OT Diagnosis: Generalized weakness   OT Problem List: Decreased knowledge  of use of DME or AE;Decreased knowledge of precautions;Impaired balance (sitting and/or standing)   OT Treatment/Interventions: Self-care/ADL training;Therapeutic exercise;DME and/or AE instruction;Therapeutic activities    OT Goals(Current goals can be found in the care plan section) Acute Rehab OT Goals Patient Stated Goal: to take a bath OT Goal Formulation: With patient Time For Goal Achievement: 01/07/15 Potential to Achieve Goals: Good  OT Frequency: Min 2X/week (pending progress of possible surg friday)   Barriers to D/C:            Co-evaluation              End of Session Equipment Utilized During Treatment: Surveyor, mining Communication: Mobility status;Precautions  Activity Tolerance: Patient tolerated  treatment well Patient left: in chair;with call bell/phone within reach   Time: 1035-    Charges:  OT General Charges $OT Visit: 1 Procedure OT Evaluation $Initial OT Evaluation Tier I: 1 Procedure OT Treatments $Self Care/Home Management : 8-22 mins G-Codes: OT G-codes **NOT FOR INPATIENT CLASS** Functional Assessment Tool Used: clinical judgement Functional Limitation: Self care Self Care Current Status (Y6503): At least 1 percent but less than 20 percent impaired, limited or restricted Self Care Goal Status (T4656): 0 percent impaired, limited or restricted  Parke Poisson B 12/24/2014, 1:10 PM Pager: [phone]

## 2014-12-24 NOTE — Progress Notes (Signed)
Physical Therapy Note  Addendum for missed G-CODE    01-17-2015 1000  PT G-Codes **NOT FOR INPATIENT CLASS**  Functional Assessment Tool Used Clinical Observation  Functional Limitation Mobility: Walking and moving around  Mobility: Walking and Moving Around Current Status [phone]) CI  Mobility: Walking and Moving Around Goal Status [phone]) CI   Camille Bal [place], [place]

## 2014-12-24 NOTE — Progress Notes (Signed)
[facility] PROGRESS NOTE  Jodi Peters CNO:[id number] DOB: 09-26-1929 DOA: 12/22/2014 PCP: Rosita Fire, MD  Assessment/Plan: #1 severe stenosis with myelopathy at T11-T12/severe stenosis L3-L4 secondary to spondylosis/cord compression Patient with central disc herniation and moderate spondylosis causing severe stenosis with myelopathy at T11-T12. Patient with worsening gait with difficulty ambulating. Patient has been evaluated by neurosurgery who are recommending surgical decompression at T11-T12 and also at L3-L4. Patient currently on Decadron. PT/OT. Patient with clinical improvement on steroids and ambulating with physical therapy with controlled pain. Patient scheduled for surgery tomorrow per neurosurgery. Patient states she is in agreement with surgery.   #2 hypertension Increase lisinopril to 40 mg daily. Continue Norvasc at 10 mg daily. Hydralazine when necessary.   #3 glaucoma Continue eyedrops of Alphagan, Cosopt, xalatan.  #4 anxiety Stable. Continue Xanax.  #5 GERD Continue Pepcid.  #6 prophylaxis Pepcid for GI prophylaxis. Heparin for DVT prophylaxis.  Code Status: Full Family Communication: updated patient. No family present. Disposition Plan: Remain inpatient pending surgery.   Consultants:  Nuerosurgery: Dr Ellene Route 12/23/14  Procedures:  MRI L spine 12/22/14  Antibiotics:  None  HPI/Subjective: Patient denies SOB. Patient denies chest pain. Patient stated she ambulated with physical therapy with a rolling walker about 30 feet. Patient denies any weakness in her legs. Patient stated was able to have a bowel movement today. Patient states she's decided to have surgery tomorrow.  Objective: Filed Vitals:   12/24/14 1417  BP: 135/56  Pulse: 96  Temp: 98.1 F (36.7 C)  Resp: 18    Intake/Output Summary (Last 24 hours) at 12/24/14 1713 Last data filed at 12/23/14 2107  Gross per 24 hour  Intake      0 ml  Output      1 ml  Net     -1 ml    Filed Weights   12/22/14 1723  Weight: 63.957 kg (141 lb)    Exam:   General:  NAD  Cardiovascular: RRR  Respiratory: CTAB  Abdomen: Soft/NT/ND/+BS  Musculoskeletal: No c/c/e  Data Reviewed: Basic Metabolic Panel:  Recent Labs Lab 12/22/14 2050 12/24/14 0505  NA 143 139  K 3.5 4.5  CL 106 109  CO2 27 22  GLUCOSE 99 115*  BUN 23* 26*  CREATININE 1.02* 1.09*  CALCIUM 10.2 10.4*   Liver Function Tests: No results for input(s): AST, ALT, ALKPHOS, BILITOT, PROT, ALBUMIN in the last 168 hours. No results for input(s): LIPASE, AMYLASE in the last 168 hours. No results for input(s): AMMONIA in the last 168 hours. CBC:  Recent Labs Lab 12/22/14 2015 12/24/14 0505  WBC 7.1 10.2  NEUTROABS 3.6  --   HGB 12.7 11.3*  HCT 38.9 35.5*  MCV 90.0 90.1  PLT 430* 421*   Cardiac Enzymes: No results for input(s): CKTOTAL, CKMB, CKMBINDEX, TROPONINI in the last 168 hours. BNP (last 3 results) No results for input(s): BNP in the last 8760 hours.  ProBNP (last 3 results) No results for input(s): PROBNP in the last 8760 hours.  CBG:  Recent Labs Lab 12/23/14 1159 12/23/14 1724 12/24/14 0830 12/24/14 1149 12/24/14 1619  GLUCAP 116* 95 139* 106* 114*    No results found for this or any previous visit (from the past 240 hour(s)).   Studies: No results found.  Scheduled Meds: . acetaminophen  1,000 mg Oral 3 times per day  . amLODipine  10 mg Oral Daily  . brimonidine  1 drop Both Eyes BID  . dexamethasone  2 mg Oral 3 times  per day  . dorzolamide-timolol  1 drop Both Eyes BID  . famotidine  20 mg Oral BID  . gemfibrozil  600 mg Oral BID AC  . heparin  5,000 Units Subcutaneous 3 times per day  . insulin aspart  0-9 Units Subcutaneous TID WC  . latanoprost  1 drop Both Eyes QHS  . lisinopril  40 mg Oral Daily  . pilocarpine  1 drop Both Eyes QHS  . traMADol  50 mg Oral BID   Continuous Infusions: . sodium chloride 75 mL/hr at 12/24/14 0037     Principal Problem:   Spinal cord compression Active Problems:   Anxiety state   Glaucoma   IBS   Spinal stenosis   Essential hypertension   Spinal stenosis of thoracic region    Time spent: 35 mins    [facility] MD [facility] Pager [phone]. If 7PM-7AM, please contact night-coverage at [email or web address], password [facility] 12/24/2014, 5:13 PM  LOS: 2 days

## 2014-12-24 NOTE — Progress Notes (Signed)
Pt ambulated to the bathroom with rolling walker standby assist. Gait steady. No noted distress. Pt sitting up in chair. Call bell within reach. Will continue to monitor.

## 2014-12-25 ENCOUNTER — Observation Stay (HOSPITAL_COMMUNITY): Payer: Medicare Other | Admitting: Anesthesiology

## 2014-12-25 ENCOUNTER — Encounter (HOSPITAL_COMMUNITY)
Admission: RE | Disposition: A | Discharge status: Skilled Nursing Facility | Payer: Self-pay | Source: Ambulatory Visit | Attending: Internal Medicine

## 2014-12-25 ENCOUNTER — Observation Stay (HOSPITAL_COMMUNITY): Payer: Medicare Other

## 2014-12-25 ENCOUNTER — Encounter (HOSPITAL_COMMUNITY): Payer: Self-pay | Admitting: Anesthesiology

## 2014-12-25 DIAGNOSIS — Z9889 Other specified postprocedural states: Secondary | ICD-10-CM | POA: Diagnosis not present

## 2014-12-25 HISTORY — PX: LUMBAR LAMINECTOMY/DECOMPRESSION MICRODISCECTOMY: SHX5026

## 2014-12-25 LAB — CBC
HEMATOCRIT: 33.6 % — AB (ref 36.0–46.0)
Hemoglobin: 10.7 g/dL — ABNORMAL LOW (ref 12.0–15.0)
MCH: 28.7 pg (ref 26.0–34.0)
MCHC: 31.8 g/dL (ref 30.0–36.0)
MCV: 90.1 fL (ref 78.0–100.0)
PLATELETS: 416 10*3/uL — AB (ref 150–400)
RBC: 3.73 MIL/uL — ABNORMAL LOW (ref 3.87–5.11)
RDW: 13.5 % (ref 11.5–15.5)
WBC: 10.8 10*3/uL — AB (ref 4.0–10.5)

## 2014-12-25 LAB — GLUCOSE, CAPILLARY
GLUCOSE-CAPILLARY: 105 mg/dL — AB (ref 65–99)
Glucose-Capillary: 140 mg/dL — ABNORMAL HIGH (ref 65–99)
Glucose-Capillary: 102 mg/dL — ABNORMAL HIGH (ref 65–99)
Glucose-Capillary: 85 mg/dL (ref 65–99)

## 2014-12-25 LAB — BASIC METABOLIC PANEL
Anion gap: 6 (ref 5–15)
BUN: 25 mg/dL — ABNORMAL HIGH (ref 6–20)
CALCIUM: 10 mg/dL (ref 8.9–10.3)
CHLORIDE: 110 mmol/L (ref 101–111)
CO2: 23 mmol/L (ref 22–32)
Creatinine, Ser: 0.96 mg/dL (ref 0.44–1.00)
GFR calc Af Amer: >60 mL/min (ref >60)
GFR calc non Af Amer: 53 mL/min — ABNORMAL LOW (ref >60)
GLUCOSE: 115 mg/dL — AB (ref 65–99)
Potassium: 4.3 mmol/L (ref 3.5–5.1)
Sodium: 139 mmol/L (ref 135–145)

## 2014-12-25 LAB — PROTIME-INR
INR: 1.05 (ref 0.00–1.49)
PROTHROMBIN TIME: 13.9 s (ref 11.6–15.2)

## 2014-12-25 SURGERY — LUMBAR LAMINECTOMY/DECOMPRESSION MICRODISCECTOMY 2 LEVELS
Anesthesia: General | Site: Back

## 2014-12-25 MED ORDER — KETOROLAC TROMETHAMINE 15 MG/ML IJ SOLN
15.0000 mg | Freq: Four times a day (QID) | INTRAMUSCULAR | Status: AC
  Administered 2014-12-25 – 2014-12-26 (×5): 15 mg via INTRAVENOUS
  Filled 2014-12-25 (×4): qty 1

## 2014-12-25 MED ORDER — FENTANYL CITRATE (PF) 250 MCG/5ML IJ SOLN
INTRAMUSCULAR | Status: AC
  Filled 2014-12-25: qty 5

## 2014-12-25 MED ORDER — PHENOL 1.4 % MT LIQD: 1.0000 | OROMUCOSAL | Status: DC | PRN

## 2014-12-25 MED ORDER — HYDROMORPHONE HCL 1 MG/ML IJ SOLN
0.2500 mg | INTRAMUSCULAR | Status: DC | PRN
  Administered 2014-12-25 (×6): 0.5 mg via INTRAVENOUS

## 2014-12-25 MED ORDER — VANCOMYCIN HCL 1000 MG IV SOLR
1000.0000 mg | INTRAVENOUS | Status: DC | PRN
  Administered 2014-12-25: 1000 mg via INTRAVENOUS

## 2014-12-25 MED ORDER — ONDANSETRON HCL 4 MG/2ML IJ SOLN
INTRAMUSCULAR | Status: AC
  Filled 2014-12-25: qty 2

## 2014-12-25 MED ORDER — METHOCARBAMOL 500 MG PO TABS
500.0000 mg | ORAL_TABLET | Freq: Four times a day (QID) | ORAL | Status: DC | PRN
  Administered 2014-12-27: 500 mg via ORAL
  Filled 2014-12-25: qty 1

## 2014-12-25 MED ORDER — GLYCOPYRROLATE 0.2 MG/ML IJ SOLN
INTRAMUSCULAR | Status: AC
  Filled 2014-12-25: qty 2

## 2014-12-25 MED ORDER — HYDROMORPHONE HCL 1 MG/ML IJ SOLN
INTRAMUSCULAR | Status: AC
  Filled 2014-12-25: qty 1

## 2014-12-25 MED ORDER — LIDOCAINE-EPINEPHRINE 1 %-1:100000 IJ SOLN
INTRAMUSCULAR | Status: DC | PRN
  Administered 2014-12-25: 10 mL

## 2014-12-25 MED ORDER — LIDOCAINE HCL (CARDIAC) 20 MG/ML IV SOLN
INTRAVENOUS | Status: DC | PRN
  Administered 2014-12-25: 80 mg via INTRAVENOUS

## 2014-12-25 MED ORDER — PROPOFOL 10 MG/ML IV BOLUS
INTRAVENOUS | Status: DC | PRN
  Administered 2014-12-25: 80 mg via INTRAVENOUS

## 2014-12-25 MED ORDER — NEOSTIGMINE METHYLSULFATE 10 MG/10ML IV SOLN
INTRAVENOUS | Status: AC
  Filled 2014-12-25: qty 1

## 2014-12-25 MED ORDER — ALUM & MAG HYDROXIDE-SIMETH 200-200-20 MG/5ML PO SUSP: 30.0000 mL | Freq: Four times a day (QID) | ORAL | Status: DC | PRN

## 2014-12-25 MED ORDER — GLYCOPYRROLATE 0.2 MG/ML IJ SOLN
INTRAMUSCULAR | Status: DC | PRN
  Administered 2014-12-25: 0.4 mg via INTRAVENOUS

## 2014-12-25 MED ORDER — KETOROLAC TROMETHAMINE 15 MG/ML IJ SOLN
INTRAMUSCULAR | Status: AC
  Filled 2014-12-25: qty 1

## 2014-12-25 MED ORDER — LACTATED RINGERS IV SOLN
INTRAVENOUS | Status: DC | PRN
  Administered 2014-12-25: 15:00:00 via INTRAVENOUS

## 2014-12-25 MED ORDER — MENTHOL 3 MG MT LOZG: 1.0000 | LOZENGE | OROMUCOSAL | Status: DC | PRN

## 2014-12-25 MED ORDER — ROCURONIUM BROMIDE 100 MG/10ML IV SOLN
INTRAVENOUS | Status: DC | PRN
  Administered 2014-12-25: 30 mg via INTRAVENOUS

## 2014-12-25 MED ORDER — POLYETHYLENE GLYCOL 3350 17 G PO PACK
17.0000 g | PACK | Freq: Every day | ORAL | Status: DC | PRN
  Administered 2014-12-28: 17 g via ORAL
  Filled 2014-12-25: qty 1

## 2014-12-25 MED ORDER — HEMOSTATIC AGENTS (NO CHARGE) OPTIME
TOPICAL | Status: DC | PRN
  Administered 2014-12-25: 1 via TOPICAL

## 2014-12-25 MED ORDER — SODIUM CHLORIDE 0.9 % IJ SOLN
3.0000 mL | Freq: Two times a day (BID) | INTRAMUSCULAR | Status: DC
  Administered 2014-12-25 – 2014-12-27 (×3): 3 mL via INTRAVENOUS

## 2014-12-25 MED ORDER — HYDROMORPHONE HCL 1 MG/ML IJ SOLN: 0.5000 mg | INTRAMUSCULAR | Status: DC | PRN

## 2014-12-25 MED ORDER — THROMBIN 5000 UNITS EX SOLR
CUTANEOUS | Status: DC | PRN
  Administered 2014-12-25 (×2): 5000 [IU] via TOPICAL

## 2014-12-25 MED ORDER — FENTANYL CITRATE (PF) 100 MCG/2ML IJ SOLN
INTRAMUSCULAR | Status: DC | PRN
  Administered 2014-12-25 (×3): 50 ug via INTRAVENOUS

## 2014-12-25 MED ORDER — ACETAMINOPHEN 325 MG PO TABS: 650.0000 mg | ORAL_TABLET | ORAL | Status: DC | PRN

## 2014-12-25 MED ORDER — SODIUM CHLORIDE 0.9 % IV SOLN: 250.0000 mL | INTRAVENOUS | Status: DC

## 2014-12-25 MED ORDER — DEXTROSE 5 % IV SOLN
500.0000 mg | Freq: Four times a day (QID) | INTRAVENOUS | Status: DC | PRN
  Filled 2014-12-25: qty 5

## 2014-12-25 MED ORDER — NEOSTIGMINE METHYLSULFATE 10 MG/10ML IV SOLN
INTRAVENOUS | Status: DC | PRN
  Administered 2014-12-25: 3 mg via INTRAVENOUS

## 2014-12-25 MED ORDER — DOCUSATE SODIUM 100 MG PO CAPS
100.0000 mg | ORAL_CAPSULE | Freq: Two times a day (BID) | ORAL | Status: DC
  Administered 2014-12-25 – 2014-12-30 (×10): 100 mg via ORAL
  Filled 2014-12-25 (×10): qty 1

## 2014-12-25 MED ORDER — SODIUM CHLORIDE 0.9 % IJ SOLN: 3.0000 mL | INTRAMUSCULAR | Status: DC | PRN

## 2014-12-25 MED ORDER — BUPIVACAINE HCL (PF) 0.5 % IJ SOLN
INTRAMUSCULAR | Status: DC | PRN
  Administered 2014-12-25: 10 mL

## 2014-12-25 MED ORDER — SENNA 8.6 MG PO TABS
1.0000 | ORAL_TABLET | Freq: Two times a day (BID) | ORAL | Status: DC
  Administered 2014-12-25 – 2014-12-30 (×10): 8.6 mg via ORAL
  Filled 2014-12-25 (×10): qty 1

## 2014-12-25 MED ORDER — ONDANSETRON HCL 4 MG/2ML IJ SOLN: 4.0000 mg | INTRAMUSCULAR | Status: DC | PRN

## 2014-12-25 MED ORDER — VANCOMYCIN HCL IN DEXTROSE 1-5 GM/200ML-% IV SOLN
INTRAVENOUS | Status: AC
  Filled 2014-12-25: qty 200

## 2014-12-25 MED ORDER — HYDROCODONE-ACETAMINOPHEN 5-325 MG PO TABS
1.0000 | ORAL_TABLET | ORAL | Status: DC | PRN
  Administered 2014-12-27: 2 via ORAL
  Administered 2014-12-28 – 2014-12-29 (×2): 1 via ORAL
  Filled 2014-12-25 (×2): qty 1
  Filled 2014-12-25: qty 2

## 2014-12-25 MED ORDER — SODIUM CHLORIDE 0.9 % IR SOLN
Status: DC | PRN
  Administered 2014-12-25: 15:00:00

## 2014-12-25 MED ORDER — 0.9 % SODIUM CHLORIDE (POUR BTL) OPTIME
TOPICAL | Status: DC | PRN
  Administered 2014-12-25: 1000 mL

## 2014-12-25 MED ORDER — PROMETHAZINE HCL 25 MG/ML IJ SOLN: 6.2500 mg | INTRAMUSCULAR | Status: DC | PRN

## 2014-12-25 MED ORDER — ACETAMINOPHEN 650 MG RE SUPP: 650.0000 mg | RECTAL | Status: DC | PRN

## 2014-12-25 MED ORDER — DEXAMETHASONE 2 MG PO TABS
1.0000 mg | ORAL_TABLET | Freq: Three times a day (TID) | ORAL | Status: DC
  Administered 2014-12-25 – 2014-12-28 (×8): 1 mg via ORAL
  Filled 2014-12-25 (×8): qty 1

## 2014-12-25 MED ORDER — OXYCODONE-ACETAMINOPHEN 5-325 MG PO TABS: 1.0000 | ORAL_TABLET | ORAL | Status: DC | PRN

## 2014-12-25 SURGICAL SUPPLY — 56 items
ADH SKN CLS APL DERMABOND .7 (GAUZE/BANDAGES/DRESSINGS) ×1
BAG DECANTER FOR FLEXI CONT (MISCELLANEOUS) ×3 IMPLANT
BLADE CLIPPER SURG (BLADE) IMPLANT
BUR ACORN 6.0 (BURR) IMPLANT
BUR ACORN 6.0MM (BURR)
BUR MATCHSTICK NEURO 3.0 LAGG (BURR) ×3 IMPLANT
CANISTER SUCT 3000ML PPV (MISCELLANEOUS) ×3 IMPLANT
CONT SPEC 4OZ CLIKSEAL STRL BL (MISCELLANEOUS) ×3 IMPLANT
DECANTER SPIKE VIAL GLASS SM (MISCELLANEOUS) ×3 IMPLANT
DERMABOND ADVANCED (GAUZE/BANDAGES/DRESSINGS) ×2
DERMABOND ADVANCED .7 DNX12 (GAUZE/BANDAGES/DRESSINGS) ×1 IMPLANT
DRAPE LAPAROTOMY 100X72X124 (DRAPES) ×3 IMPLANT
DRAPE MICROSCOPE LEICA (MISCELLANEOUS) IMPLANT
DRAPE POUCH INSTRU U-SHP 10X18 (DRAPES) ×3 IMPLANT
DRAPE PROXIMA HALF (DRAPES) IMPLANT
DRSG OPSITE POSTOP 4X10 (GAUZE/BANDAGES/DRESSINGS) ×2 IMPLANT
DURAPREP 26ML APPLICATOR (WOUND CARE) ×3 IMPLANT
ELECT REM PT RETURN 9FT ADLT (ELECTROSURGICAL) ×3
ELECTRODE REM PT RTRN 9FT ADLT (ELECTROSURGICAL) ×1 IMPLANT
GAUZE SPONGE 4X4 12PLY STRL (GAUZE/BANDAGES/DRESSINGS) ×1 IMPLANT
GAUZE SPONGE 4X4 16PLY XRAY LF (GAUZE/BANDAGES/DRESSINGS) ×2 IMPLANT
GLOVE BIO SURGEON STRL SZ8 (GLOVE) ×2 IMPLANT
GLOVE BIOGEL PI IND STRL 8.5 (GLOVE) ×1 IMPLANT
GLOVE BIOGEL PI INDICATOR 8.5 (GLOVE) ×2
GLOVE ECLIPSE 7.5 STRL STRAW (GLOVE) ×8 IMPLANT
GLOVE ECLIPSE 8.5 STRL (GLOVE) ×5 IMPLANT
GLOVE EXAM NITRILE LRG STRL (GLOVE) IMPLANT
GLOVE EXAM NITRILE MD LF STRL (GLOVE) IMPLANT
GLOVE EXAM NITRILE XL STR (GLOVE) IMPLANT
GLOVE EXAM NITRILE XS STR PU (GLOVE) IMPLANT
GLOVE INDICATOR 8.0 STRL GRN (GLOVE) ×2 IMPLANT
GLOVE INDICATOR 8.5 STRL (GLOVE) ×2 IMPLANT
GOWN STRL REUS W/ TWL LRG LVL3 (GOWN DISPOSABLE) IMPLANT
GOWN STRL REUS W/ TWL XL LVL3 (GOWN DISPOSABLE) IMPLANT
GOWN STRL REUS W/TWL 2XL LVL3 (GOWN DISPOSABLE) ×5 IMPLANT
GOWN STRL REUS W/TWL LRG LVL3 (GOWN DISPOSABLE)
GOWN STRL REUS W/TWL XL LVL3 (GOWN DISPOSABLE) ×3
KIT BASIN OR (CUSTOM PROCEDURE TRAY) ×3 IMPLANT
KIT ROOM TURNOVER OR (KITS) ×3 IMPLANT
NDL SPNL 20GX3.5 QUINCKE YW (NEEDLE) IMPLANT
NEEDLE HYPO 22GX1.5 SAFETY (NEEDLE) ×3 IMPLANT
NEEDLE SPNL 20GX3.5 QUINCKE YW (NEEDLE) IMPLANT
NS IRRIG 1000ML POUR BTL (IV SOLUTION) ×3 IMPLANT
PACK LAMINECTOMY NEURO (CUSTOM PROCEDURE TRAY) ×3 IMPLANT
PAD ARMBOARD 7.5X6 YLW CONV (MISCELLANEOUS) ×9 IMPLANT
PATTIES SURGICAL .5 X1 (DISPOSABLE) ×5 IMPLANT
RUBBERBAND STERILE (MISCELLANEOUS) IMPLANT
SPONGE SURGIFOAM ABS GEL SZ50 (HEMOSTASIS) ×3 IMPLANT
SUT VIC AB 1 CT1 18XBRD ANBCTR (SUTURE) ×1 IMPLANT
SUT VIC AB 1 CT1 8-18 (SUTURE) ×3
SUT VIC AB 2-0 CP2 18 (SUTURE) ×3 IMPLANT
SUT VIC AB 3-0 SH 8-18 (SUTURE) ×3 IMPLANT
SYR 20ML ECCENTRIC (SYRINGE) ×3 IMPLANT
TOWEL OR 17X24 6PK STRL BLUE (TOWEL DISPOSABLE) ×3 IMPLANT
TOWEL OR 17X26 10 PK STRL BLUE (TOWEL DISPOSABLE) ×3 IMPLANT
WATER STERILE IRR 1000ML POUR (IV SOLUTION) ×3 IMPLANT

## 2014-12-25 NOTE — Progress Notes (Signed)
Pt refused to ambulate in the hallway but ambulated back and forth to the bathroom to chair this am. Pt used rolling walker, gait steady stand by assist. She denied pain or discomfort. Daughter at chair side. No noted distress. Will continue to monitor.

## 2014-12-25 NOTE — Progress Notes (Signed)
[facility] PROGRESS NOTE  Jodi Peters HDQ:[id number] DOB: [birth year] DOA: 12/22/2014 PCP: Rosita Fire, MD  Assessment/Plan: #1 severe stenosis with myelopathy at T11-T12/severe stenosis L3-L4 secondary to spondylosis/cord compression Patient with central disc herniation and moderate spondylosis causing severe stenosis with myelopathy at T11-T12. Patient with worsening gait with difficulty ambulating. Patient has been evaluated by neurosurgery who are recommending surgical decompression at T11-T12 and also at L3-L4. Patient currently on Decadron. PT/OT. Patient with clinical improvement on steroids and ambulating with physical therapy with controlled pain. Patient scheduled for surgery today. Per neurosurgery.  #2 hypertension Continue current dose of lisinopril 40 mg daily and Norvasc at 10 mg daily. Hydralazine when necessary.   #3 glaucoma Continue eyedrops of Alphagan, Cosopt, xalatan.  #4 anxiety Stable. Continue Xanax.  #5 GERD Continue Pepcid.  #6 prophylaxis Pepcid for GI prophylaxis. Heparin for DVT prophylaxis.  Code Status: Full Family Communication: updated patient. No family present. Disposition Plan: Remain inpatient pending surgery today.   Consultants:  Nuerosurgery: Dr Ellene Route 12/23/14  Procedures:  MRI L spine 12/22/14  Antibiotics:  None  HPI/Subjective: Patient denies SOB. Patient denies chest pain. Patient states she ambulated in the hallway. Patient feels better than on admission. Patient awaiting surgery for today.  Objective: Filed Vitals:   12/25/14 0626  BP: 164/69  Pulse: 62  Temp: 98 F (36.7 C)  Resp: 20   No intake or output data in the 24 hours ending 12/25/14 0935 Filed Weights   12/22/14 1723  Weight: 63.957 kg (141 lb)    Exam:   General:  NAD  Cardiovascular: RRR  Respiratory: CTAB  Abdomen: Soft/NT/ND/+BS  Musculoskeletal: No c/c/e  Data Reviewed: Basic Metabolic Panel:  Recent Labs Lab 12/22/14 2050  12/24/14 0505 12/25/14 0403  NA 143 139 139  K 3.5 4.5 4.3  CL 106 109 110  CO2 27 22 23   GLUCOSE 99 115* 115*  BUN 23* 26* 25*  CREATININE 1.02* 1.09* 0.96  CALCIUM 10.2 10.4* 10.0   Liver Function Tests: No results for input(s): AST, ALT, ALKPHOS, BILITOT, PROT, ALBUMIN in the last 168 hours. No results for input(s): LIPASE, AMYLASE in the last 168 hours. No results for input(s): AMMONIA in the last 168 hours. CBC:  Recent Labs Lab 12/22/14 2015 12/24/14 0505 12/25/14 0403  WBC 7.1 10.2 10.8*  NEUTROABS 3.6  --   --   HGB 12.7 11.3* 10.7*  HCT 38.9 35.5* 33.6*  MCV 90.0 90.1 90.1  PLT 430* 421* 416*   Cardiac Enzymes: No results for input(s): CKTOTAL, CKMB, CKMBINDEX, TROPONINI in the last 168 hours. BNP (last 3 results) No results for input(s): BNP in the last 8760 hours.  ProBNP (last 3 results) No results for input(s): PROBNP in the last 8760 hours.  CBG:  Recent Labs Lab 12/24/14 0830 12/24/14 1149 12/24/14 1619 12/24/14 2138 12/25/14 0620  GLUCAP 139* 106* 114* 140* 102*    No results found for this or any previous visit (from the past 240 hour(s)).   Studies: No results found.  Scheduled Meds: . acetaminophen  1,000 mg Oral 3 times per day  . amLODipine  10 mg Oral Daily  . brimonidine  1 drop Both Eyes BID  . dexamethasone  2 mg Oral 3 times per day  . dorzolamide-timolol  1 drop Both Eyes BID  . famotidine  20 mg Oral BID  . gemfibrozil  600 mg Oral BID AC  . heparin  5,000 Units Subcutaneous 3 times per day  . insulin aspart  0-9 Units Subcutaneous TID WC  . latanoprost  1 drop Both Eyes QHS  . lisinopril  40 mg Oral Daily  . pilocarpine  1 drop Both Eyes QHS  . traMADol  50 mg Oral BID   Continuous Infusions:    Principal Problem:   Spinal cord compression Active Problems:   Anxiety state   Glaucoma   IBS   Spinal stenosis   Essential hypertension   Spinal stenosis of thoracic region    Time spent: 35  mins    [facility] MD [facility] Pager [phone]. If 7PM-7AM, please contact night-coverage at [email or web address], password [facility] 12/25/2014, 9:35 AM  LOS: 3 days

## 2014-12-25 NOTE — Progress Notes (Signed)
Patient ID: Jodi Peters, female   DOB: 02/01/1930, 81 y.o.   MRN: [id number] Vital signs are stable Patient feels reasonably comfortable postoperatively Having some right lower quadrant discomfort Lower extremity strength is intact Incisions are clean and dry

## 2014-12-25 NOTE — Op Note (Signed)
Date of surgery: 12/25/2014 Preoperative diagnosis: Herniated nucleus pulposus and stenosis with myelopathy T11-T12 and L3-L4, conus medullaris syndrome Postoperative diagnosis: Herniated nucleus pulposus and stenosis with myelopathy T11-T12 and L3-L4, conus medullaris syndrome Procedure: Bilateral laminotomies T11-T12 decompression of spinal canal, bilateral laminotomies L3-L4 decompression of lumbar spinal stenosis. Surgeon: Kristeen Miss First Asst.: Francesca Jewett M.D. Anesthesia: Gen. endotracheal Indications: The patient is an 81 year old individual who's had significant back and bilateral lower extremity weakness and severe pain she's had control of her bladder and bowels but was weak to the point where she couldn't walk. An MRI was performed emergently a few days ago and this demonstrated the presence of a centrally herniated disc at T11-T12 with evidence of severe spondylosis and stenosis with cord signal changes in the region of the conus at T11-T12. In addition the patient had severe stenosis at L3-L4 secondary to spondylitic overgrowth. She is advised regarding the need for surgery. She had been placed at bedrest and on some oral sterilizing seemed to have recovered a fair amount of function.  Procedure: The patient was brought to the operating room supine on a stretcher. After the smooth induction of general endotracheal anesthesia, she was turned prone. The back was prepped with alcohol DuraPrep and draped in a sterile fashion. A midline incision was created at T11-12 and at L3-L4 after localizing each of these areas with a needle and x-ray. The dissection was first started on the left side were subperiosteal dissection was carried out down to the laminar arch of T11 superiorly and L3 and the L3-4 interspace inferiorly. These were again localized with a second radiograph. Then the laminotomies were enlarged and the L ligament was taken up first at L3-L4. The ligament was noted be severely thickened  and redundant and/or some calcified material just under the the inside ligament was noted and removed. This contributed to significant central stenosis. The lateral recesses were then cleared and substantial grumous material was encountered inside the facet joints contributing to stenosis in this region. Once this was cleared the path of the 3 nerve root superiorly and the L4 nerve root inferiorly were free and clear. Procedure was then repeated on the opposite side and again the L3 and the L4 nerve roots were decompressed individually with a 2 and 3 mm Kerrison punch and the central canal is decompressed of similar grumous material from the inside of the facet joint.  At this stage hemostasis was achieved in the epidural spaces the undersurface of the disc was noted to be intact and no loose ligamentous material or free fragments of disc were encountered at the L3-L4 level. Attention was then turned to T11-T12. Here a laminotomy was created removing the inferior margin lamina of T11 and the superior marginal lamina of T12. The underlying yellow ligament was noted to be thickened and calcified as it was in the region of L3-L4 and this was elevated and removed from the underlying dura. Laterally the facet joints were noted to have a substantial amount of thickening grumous material that was causing compression centrally this was removed with a 2 and 3 mm Kerrison punch the facet was undercut partially. The undersurface of the dura was then palpated with a small nerve hook was noted to be no frank disc herniation here but bulging of the ligament dorsally. The ligament itself felt calcified. With this the opposite side was decompressed and similar material was encountered in the end the common dural tube at the T11-T12 disc space was well decompressed no fragments  of disc were identified and hemostasis was then achieved in the epidural spaces. The retractors were then removed the lumbar dorsal fascia was closed at  L3-L4 with #1 Vicryls the thoracic of dorsal fascia was closed at T11-T12 with #1 Vicryls 20 Vicryls using the subcutaneous tissues on each 130 Vicryls subcuticularly. Dermabond was placed on the skin. Blood loss for the procedure in total was approximately 100 mL. Patient tolerated procedure well and returned to recovery room in stable condition.

## 2014-12-25 NOTE — Progress Notes (Signed)
PT Cancellation Note  Patient Details Name: Jodi Peters MRN: [id number] DOB: [birth year]   Cancelled Treatment:    Reason Eval/Treat Not Completed: Patient at procedure or test/unavailable Patient to OR for surgery. Please re-order physical therapy post-op when patient is medically ready for continued therapy.  [name] 12/25/2014, 4:02 PM Elayne Snare, [place]

## 2014-12-25 NOTE — Transfer of Care (Signed)
Immediate Anesthesia Transfer of Care Note  Patient: Jodi Peters  Procedure(s) Performed: Procedure(s): Thoracic eleven-twelve Laminectomy/Diskectomy; Lumbar three-four Laminectomy/Diskectomy (N/A)  Patient Location: PACU  Anesthesia Type:General  Level of Consciousness: awake and alert   Airway & Oxygen Therapy: Patient Spontanous Breathing and Patient connected to nasal cannula oxygen  Post-op Assessment: Report given to RN and Post -op Vital signs reviewed and stable  Post vital signs: Reviewed and stable  Last Vitals:  Filed Vitals:   12/25/14 1405  BP: 180/69  Pulse: 64  Temp: 36.7 C  Resp: 18    Complications: No apparent anesthesia complications

## 2014-12-25 NOTE — Anesthesia Preprocedure Evaluation (Signed)
Anesthesia Evaluation  Patient identified by MRN, date of birth, ID band Patient awake    Reviewed: Allergy & Precautions, NPO status , Patient's Chart, lab work & pertinent test results  Airway Mallampati: II  TM Distance: >3 FB Neck ROM: Full    Dental no notable dental hx.    Pulmonary neg pulmonary ROS,  breath sounds clear to auscultation  Pulmonary exam normal       Cardiovascular hypertension, Pt. on medications Normal cardiovascular exam+ Valvular Problems/Murmurs MR Rhythm:Regular Rate:Normal     Neuro/Psych Anxiety negative neurological ROS     GI/Hepatic negative GI ROS, Neg liver ROS,   Endo/Other  negative endocrine ROS  Renal/GU negative Renal ROS  negative genitourinary   Musculoskeletal negative musculoskeletal ROS (+)   Abdominal   Peds negative pediatric ROS (+)  Hematology negative hematology ROS (+)   Anesthesia Other Findings   Reproductive/Obstetrics negative OB ROS                             Anesthesia Physical Anesthesia Plan  ASA: II  Anesthesia Plan: General   Post-op Pain Management:    Induction: Intravenous  Airway Management Planned: Oral ETT  Additional Equipment:   Intra-op Plan:   Post-operative Plan: Extubation in OR  Informed Consent: I have reviewed the patients History and Physical, chart, labs and discussed the procedure including the risks, benefits and alternatives for the proposed anesthesia with the patient or authorized representative who has indicated his/her understanding and acceptance.   Dental advisory given  Plan Discussed with: CRNA and Surgeon  Anesthesia Plan Comments:         Anesthesia Quick Evaluation

## 2014-12-25 NOTE — Anesthesia Procedure Notes (Signed)
Procedure Name: Intubation Date/Time: 12/25/2014 3:31 PM Performed by: Manus Gunning, Jania Steinke J Pre-anesthesia Checklist: Patient identified, Timeout performed, Emergency Drugs available, Suction available and Patient being monitored Patient Re-evaluated:Patient Re-evaluated prior to inductionOxygen Delivery Method: Circle system utilized Preoxygenation: Pre-oxygenation with 100% oxygen Intubation Type: IV induction Ventilation: Mask ventilation without difficulty Laryngoscope Size: Mac and 3 Grade View: Grade I Tube type: Oral Tube size: 7.0 mm Number of attempts: 1 Placement Confirmation: ETT inserted through vocal cords under direct vision,  positive ETCO2 and breath sounds checked- equal and bilateral Secured at: 21 cm Tube secured with: Tape Dental Injury: Teeth and Oropharynx as per pre-operative assessment

## 2014-12-26 LAB — CBC WITH DIFFERENTIAL/PLATELET
BASOS ABS: 0 10*3/uL (ref 0.0–0.1)
Basophils Relative: 0 % (ref 0–1)
Eosinophils Absolute: 0 10*3/uL (ref 0.0–0.7)
Eosinophils Relative: 0 % (ref 0–5)
HCT: 35.1 % — ABNORMAL LOW (ref 36.0–46.0)
Hemoglobin: 11.5 g/dL — ABNORMAL LOW (ref 12.0–15.0)
LYMPHS ABS: 1.4 10*3/uL (ref 0.7–4.0)
Lymphocytes Relative: 12 % (ref 12–46)
MCH: 29.8 pg (ref 26.0–34.0)
MCHC: 32.8 g/dL (ref 30.0–36.0)
MCV: 90.9 fL (ref 78.0–100.0)
Monocytes Absolute: 1.5 10*3/uL — ABNORMAL HIGH (ref 0.1–1.0)
Monocytes Relative: 13 % — ABNORMAL HIGH (ref 3–12)
Neutro Abs: 8.7 10*3/uL — ABNORMAL HIGH (ref 1.7–7.7)
Neutrophils Relative %: 75 % (ref 43–77)
PLATELETS: 431 10*3/uL — AB (ref 150–400)
RBC: 3.86 MIL/uL — AB (ref 3.87–5.11)
RDW: 13.9 % (ref 11.5–15.5)
WBC: 11.6 10*3/uL — ABNORMAL HIGH (ref 4.0–10.5)

## 2014-12-26 LAB — BASIC METABOLIC PANEL
Anion gap: 7 (ref 5–15)
BUN: 23 mg/dL — ABNORMAL HIGH (ref 6–20)
CALCIUM: 9.9 mg/dL (ref 8.9–10.3)
CO2: 25 mmol/L (ref 22–32)
Chloride: 108 mmol/L (ref 101–111)
Creatinine, Ser: 1.02 mg/dL — ABNORMAL HIGH (ref 0.44–1.00)
GFR calc Af Amer: 57 mL/min — ABNORMAL LOW (ref >60)
GFR, EST NON AFRICAN AMERICAN: 49 mL/min — AB (ref >60)
Glucose, Bld: 97 mg/dL (ref 65–99)
POTASSIUM: 4.4 mmol/L (ref 3.5–5.1)
SODIUM: 140 mmol/L (ref 135–145)

## 2014-12-26 LAB — GLUCOSE, CAPILLARY
GLUCOSE-CAPILLARY: 125 mg/dL — AB (ref 65–99)
Glucose-Capillary: 90 mg/dL (ref 65–99)
Glucose-Capillary: 88 mg/dL (ref 65–99)
Glucose-Capillary: 97 mg/dL (ref 65–99)

## 2014-12-26 NOTE — Progress Notes (Signed)
Subjective: Patient reports No pain just feels confused since surgery  Objective: Vital signs in last 24 hours: Temp:  [97.7 F (36.5 C)-99.1 F (37.3 C)] 98.8 F (37.1 C) (07/16 0529) Pulse Rate:  [43-90] 73 (07/16 0529) Resp:  [10-24] 18 (07/16 0529) BP: (152-192)/(58-85) 152/85 mmHg (07/16 0529) SpO2:  [93 %-100 %] 98 % (07/16 0529)  Intake/Output from previous day: 07/15 0701 - 07/16 0700 In: 1053 [I.V.:803; IV Piggyback:250] Out: 315 [Urine:300; Blood:15] Intake/Output this shift:    Moves all extremities well with at least 4+ out of 5 strength wound clean dry and intact  Lab Results:  Recent Labs  12/25/14 0403 12/26/14 0333  WBC 10.8* 11.6*  HGB 10.7* 11.5*  HCT 33.6* 35.1*  PLT 416* 431*   BMET  Recent Labs  12/25/14 0403 12/26/14 0333  NA 139 140  K 4.3 4.4  CL 110 108  CO2 23 25  GLUCOSE 115* 97  BUN 25* 23*  CREATININE 0.96 1.02*  CALCIUM 10.0 9.9    Studies/Results: Dg Lumbar Spine 2-3 Views  12/25/2014   CLINICAL DATA:  T11-12 and L3-4 laminectomy/microdiscectomy  EXAM: LUMBAR SPINE - 2-3 VIEW  COMPARISON:  None.  FINDINGS: Two intraoperative cross-table lateral radiographs of the lumbar spine are provided. There is degenerative disc disease at L3-4, L4-5 and L5-S1.  X-ray #1 demonstrates 2 metallic needles. The more superior needle tip projects over the inferior aspect of the T12 spinous process. The more inferior needle tip projects along the inferior aspect of the left inferior articulating facet of L3.  X-ray #2 demonstrates posterior tissue retractors. There is a metallic instruments superiorly which projects just posterior to the inferior articulating facet of T11. There is a tissue slammed projecting over the inferior margin of the L3 spinous process.  IMPRESSION: Intraoperative localization radiographs as detailed above.   Electronically Signed   By: Kathreen Devoid   On: 12/25/2014 16:15    Assessment/Plan: Postop day 1 from decompressive  laminectomies at T11-12 and L3-4. Continue to progressively mobilize with physical therapy started hold asking for pain medication as this would've contributed confusion as long she is not in pain.  LOS: 4 days     Nyzir Dubois P 12/26/2014, 8:47 AM

## 2014-12-26 NOTE — Care Management Note (Signed)
Case Management Note  Patient Details  Name: Jodi Peters MRN: [id number] Date of Birth: 1930-05-05  Subjective/Objective:    81 yr old female s/p Bilateral laminectomies T11-T12                Action/Plan: Case manager received order to arrange [facility] ([place] Patient needs shortterm rehab at [facility]. Social worker notified.   Expected Discharge Date:                  Expected Discharge Plan:     In-House Referral:  Clinical Social Work  Discharge planning Services  CM Consult  Post Acute Care Choice:    Choice offered to:  NA  DME Arranged:  N/A DME Agency:  NA  HH Arranged:  NA HH Agency:     Status of Service:  Completed, signed off  Medicare Important Message Given:    Date Medicare IM Given:    Medicare IM give by:    Date Additional Medicare IM Given:    Additional Medicare Important Message give by:     If discussed at [place] of Stay Meetings, dates discussed:    Additional Comments:  Ninfa Meeker, RN 12/26/2014, 11:06 AM

## 2014-12-26 NOTE — Evaluation (Signed)
Physical Therapy Evaluation Patient Details Name: Jodi Peters MRN: [id number] DOB: 1930/05/27 Today's Date: 12/26/2014   History of Present Illness  81 y.o. female admitted with severe stenosis with myelopathy at T11-T12/severe stenosis L3-L4 secondary to spondylosis/cord compression. now s/p decompression 7/15.  Clinical Impression  Patient reassessed following surgical intervention for decompression of spinal cord. Patient presents with deficits as indicated below. At this time, patient requiring hands on physical assistance due to weakness and safety. Patient does not have available assist at home (son works 3rd shift and sleeps during the day) at this time, recommend ST SNF until patient reaches level of independence to ensure safety and maximize recovery. Will see as indicated and progress as tolerated.     Follow Up Recommendations SNF;Supervision/Assistance - 24 hour    Equipment Recommendations  None recommended by PT    Recommendations for Other Services       Precautions / Restrictions Precautions Precautions: Fall Restrictions Weight Bearing Restrictions: No      Mobility  Bed Mobility Overal bed mobility: Needs Assistance Bed Mobility: Rolling;Sidelying to Sit Rolling: Min assist Sidelying to sit: Min guard       General bed mobility comments: VCs for technique of log roll (performed x2)   Transfers Overall transfer level: Needs assistance Equipment used: Rolling walker (2 wheeled) Transfers: Sit to/from [facility] Sit to Stand: Min assist Stand pivot transfers: Min assist       General transfer comment: Min assist to power up to standing, increased time to perform, min assist for stability during pivot to [facility]  Ambulation/Gait Ambulation/Gait assistance: Min assist Ambulation Distance (Feet): 80 Feet Assistive device: Rolling walker (2 wheeled) Gait Pattern/deviations: Step-to pattern;Decreased step length - right;Decreased step length  - left;Decreased stride length;Decreased dorsiflexion - right;Decreased dorsiflexion - left;Ataxic;Trunk flexed Gait velocity: slow Gait velocity interpretation: Below normal speed for age/gender General Gait Details: VCs for increased cadence and upright posture, patient with difficulty steering and using RW, cues for positioning and safety. Assist for stability and at time assist for control of RW.  Stairs            Wheelchair Mobility    Modified Rankin (Stroke Patients Only)       Balance Overall balance assessment: Needs assistance Sitting-balance support: No upper extremity supported Sitting balance-Leahy Scale: Fair     Standing balance support: Bilateral upper extremity supported Standing balance-Leahy Scale: Poor                               Pertinent Vitals/Pain Pain Assessment: Faces Faces Pain Scale: Hurts even more Pain Location: surgical site and low back Pain Descriptors / Indicators: Grimacing;Guarding;Sore;Operative site guarding Pain Intervention(s): Monitored during session;Limited activity within patient's tolerance;Repositioned;Relaxation    Home Living Family/patient expects to be discharged to:: Private residence Living Arrangements: Children Available Help at Discharge: Family;Available 24 hours/day (son works nights but can find supervision at night) Type of Home: House Home Access: Level entry     [place]: One [place]: [place] - single point;Walker - standard      Prior Function Level of Independence: Independent with assistive device(s)         Comments: cane for mobility     Hand Dominance   Dominant Hand: Right    Extremity/Trunk Assessment   Upper Extremity Assessment: Defer to OT evaluation             RLE Deficits / Details:  Grossly 4-/5 throughout motions LLE Deficits / Details: Grossly 3/5 able to range through motion without resistance gross movements, decreased dorsiflexion 2/5      Communication   Communication: No difficulties  Cognition Arousal/Alertness: Awake/alert Behavior During Therapy: WFL for tasks assessed/performed Overall Cognitive Status: Within Functional Limits for tasks assessed                      General Comments      Exercises        Assessment/Plan    PT Assessment Patient needs continued PT services  PT Diagnosis Difficulty walking;Abnormality of gait;Generalized weakness   PT Problem List Decreased strength;Decreased range of motion;Decreased activity tolerance;Decreased balance;Decreased mobility;Decreased coordination;Decreased knowledge of use of DME  PT Treatment Interventions DME instruction;Gait training;Functional mobility training;Therapeutic activities;Therapeutic exercise;Balance training;Neuromuscular re-education;Patient/family education   PT Goals (Current goals can be found in the Care Plan section) Acute Rehab PT Goals Patient Stated Goal: Decide if I will have surgery or not PT Goal Formulation: With patient Time For Goal Achievement: 01/06/15 Potential to Achieve Goals: Good (If pt has surgery)    Frequency Min 3X/week   Barriers to discharge        Co-evaluation               End of Session Equipment Utilized During Treatment: Gait belt Activity Tolerance: Patient limited by fatigue;Patient limited by pain Patient left: in chair;with call bell/phone within reach;with chair alarm set;with family/visitor present Nurse Communication: Mobility status    Functional Assessment Tool Used: Clincial Judgement Functional Limitation: Mobility: Walking and moving around Mobility: Walking and Moving Around Current Status [phone]): At least 20 percent but less than 40 percent impaired, limited or restricted Mobility: Walking and Moving Around Goal Status [id number]): At least 1 percent but less than 20 percent impaired, limited or restricted    Time: 0835-0859 PT Time Calculation (min) (ACUTE ONLY): 24  min   Charges:   PT Evaluation $PT Re-evaluation: 1 Procedure     PT G Codes:   PT G-Codes **NOT FOR INPATIENT CLASS** Functional Assessment Tool Used: Clincial Judgement Functional Limitation: Mobility: Walking and moving around Mobility: Walking and Moving Around Current Status ([id number]): At least 20 percent but less than 40 percent impaired, limited or restricted Mobility: Walking and Moving Around Goal Status [phone]): At least 1 percent but less than 20 percent impaired, limited or restricted    Duncan Dull 12/26/2014, 9:26 AM Alben Deeds, PT DPT  [phone]

## 2014-12-26 NOTE — Progress Notes (Signed)
Pt arrived on unit from PACU about 1940 hrs, somewhat disoriented and drowsy, honeycomb dressing in place clean, dry, intact, no complaints from Pt at this time, transfer orders implemented.

## 2014-12-26 NOTE — Progress Notes (Signed)
Occupational Therapy Treatment Patient Details Name: Jodi Peters MRN: [id number] DOB: [birth year] Today's Date: 12/26/2014    History of present illness 81 y.o. female admitted with severe stenosis with myelopathy at T11-T12/severe stenosis L3-L4 secondary to spondylosis/cord compression. now s/p decompression 7/15.   OT comments  This 81 yo female admitted and underwent sx yesterday presents to acute OT weaker than before sx per pt's report and this is evidenced by needing more A. She will continue to benefit from acute OT with follow up at SNF to get to a Mod I level or better.  Follow Up Recommendations  SNF (does not have anyone that can be with her 24/7; wants [facility])    Equipment Recommendations   (TBD next venue)       Precautions / Restrictions Precautions Precautions: Fall Restrictions Weight Bearing Restrictions: No       Mobility Bed Mobility Overal bed mobility: Needs Assistance Bed Mobility: Rolling;Sidelying to Sit Rolling: Min assist Sidelying to sit: Min guard       General bed mobility comments: VCs for technique of log roll (performed x2)   Transfers Overall transfer level: Needs assistance Equipment used: Rolling walker (2 wheeled) Transfers: Sit to/from [facility] Sit to Stand: Min assist Stand pivot transfers: Min guard       General transfer comment: Min assist to power up to standing, increased time to perform, min assist for stability during pivot to [facility]    Balance Overall balance assessment: Needs assistance Sitting-balance support: No upper extremity supported Sitting balance-Leahy Scale: Fair     Standing balance support: Bilateral upper extremity supported Standing balance-Leahy Scale: Poor                     ADL Overall ADL's : Needs assistance/impaired Eating/Feeding: Independent;Bed level   Grooming: Set up;Supervision/safety;Sitting   Upper Body Bathing: Set up;Supervision/ safety;Sitting    Lower Body Bathing: Minimal assistance (min guard sit<>stand)   Upper Body Dressing : Set up;Sitting   Lower Body Dressing: Moderate assistance (with min guard sit<>stand)   Toilet Transfer: Minimal assistance;Stand-pivot;RW;BSC   Toileting- Clothing Manipulation and Hygiene: Minimal assistance (with min guard A sit<>stand)                Vision                 Additional Comments: No change from baseline          Cognition   Behavior During Therapy: WFL for tasks assessed/performed Overall Cognitive Status: Within Functional Limits for tasks assessed                       Extremity/Trunk Assessment  Upper Extremity Assessment Upper Extremity Assessment: 4/5   Lower Extremity Assessment RLE Deficits / Details: Grossly 4-/5 throughout motions LLE Deficits / Details: Grossly 3/5 able to range through motion without resistance gross movements, decreased dorsiflexion 2/5                   Pertinent Vitals/ Pain       Pain Assessment: Faces Faces Pain Scale: Hurts even more Pain Location: surgical site and low back Pain Descriptors / Indicators: Grimacing;Guarding;Sore;Operative site guarding Pain Intervention(s): Monitored during session;Limited activity within patient's tolerance;Repositioned;Relaxation  Home Living Family/patient expects to be discharged to:: Private residence Living Arrangements: Children Available Help at Discharge: Family;Available 24 hours/day (son works nights but can find supervision at night) Type of Home: House Home Access: Level entry  Home Layout: One level     Bathroom Shower/Tub:  (sponge bath)   Bathroom Toilet: Standard     Home Equipment: Cane - single point;Walker - standard          Prior Functioning/Environment Level of Independence: Independent with assistive device(s)        Comments: cane for mobility   Frequency Min 2X/week     Progress Toward Goals  OT Goals(current goals can  now be found in the care plan section)  Progress towards OT goals: Not progressing toward goals - comment (sx yesterday; however goals still applicable)  Acute Rehab OT Goals Patient Stated Goal: Decide if I will have surgery or not  Plan Discharge plan needs to be updated    Co-evaluation    PT/OT/SLP Co-Evaluation/Treatment: Yes Reason for Co-Treatment: For patient/therapist safety   OT goals addressed during session: ADL's and self-care;Strengthening/ROM      End of Session Equipment Utilized During Treatment: Gait belt;Rolling walker   Activity Tolerance Patient tolerated treatment well   Patient Left in chair;with call bell/phone within reach;with chair alarm set;with family/visitor present   Nurse Communication          Time: 9357-0177 OT Time Calculation (min): 24 min  Charges: OT General Charges $OT Visit: 1 Procedure OT Treatments $Self Care/Home Management : 8-22 mins  Almon Register [phone] 12/26/2014, 10:46 AM

## 2014-12-26 NOTE — Progress Notes (Signed)
[facility] PROGRESS NOTE  Jodi Peters ESP:[id number] DOB: [birth year] DOA: 12/22/2014 PCP: Rosita Fire, MD  Assessment/Plan: #1 severe stenosis with myelopathy at T11-T12/severe stenosis L3-L4 secondary to spondylosis/cord compression Patient with central disc herniation and moderate spondylosis causing severe stenosis with myelopathy at T11-T12. Patient had presented with worsening gait with difficulty ambulating. Patient has been evaluated by neurosurgery who are recommending surgical decompression at T11-T12 and also at L3-L4. Patient currently on Decadron. Patient status post bilateral laminotomies T11-T12 decompression of spinal canal, bilateral laminotomies L3-L4 decompression of lumbar spinal stenosis per Dr. Ellene Route 12/25/2014. Patient tolerated procedure well. PT/OT. Patient with some noted confusion overnight and a such will discontinue IV dye loaded and oral Percocet. Patient currently on scheduled toradol and Vicodin when necessary. Per neurosurgery.  #2 hypertension Continue current dose of lisinopril 40 mg daily and Norvasc at 10 mg daily. Hydralazine when necessary.   #3 glaucoma Continue eyedrops of Alphagan, Cosopt, xalatan.  #4 anxiety Stable. Continue Xanax.  #5 GERD Continue Pepcid.  #6 prophylaxis Pepcid for GI prophylaxis. Heparin for DVT prophylaxis.  Code Status: Full Family Communication: updated patient, son and daughter at bedside.  Disposition Plan: Skilled nursing facility when medically stable.   Consultants:  Nuerosurgery: Dr Ellene Route 12/23/14  Procedures:  MRI L spine 12/22/14 Bilateral laminotomies T11-T12 decompression of spinal canal, bilateral laminotomies L3-L4 decompression of lumbar spinal stenosis-per Dr. Ellene Route neurosurgery 12/25/2014  Antibiotics:  None  HPI/Subjective: Patient sitting up eating lunch. Patient denies chest. Patient denies shortness of breath. Patient states had some weakness in her legs last night but seems to be  slowly improving. Patient stated ambulated with walker today with physical therapy. Family noted patient with some confusion overnight which has improved.  Objective: Filed Vitals:   12/26/14 0858  BP: 139/56  Pulse: 79  Temp: 99.3 F (37.4 C)  Resp: 18    Intake/Output Summary (Last 24 hours) at 12/26/14 1141 Last data filed at 12/26/14 0952  Gross per 24 hour  Intake   1056 ml  Output    315 ml  Net    741 ml   Filed Weights   12/22/14 1723  Weight: 63.957 kg (141 lb)    Exam:   General:  NAD  Cardiovascular: RRR  Respiratory: CTAB  Abdomen: Soft/NT/ND/+BS  Musculoskeletal: No c/c/e  Data Reviewed: Basic Metabolic Panel:  Recent Labs Lab 12/22/14 2050 12/24/14 0505 12/25/14 0403 12/26/14 0333  NA 143 139 139 140  K 3.5 4.5 4.3 4.4  CL 106 109 110 108  CO2 27 22 23 25   GLUCOSE 99 115* 115* 97  BUN 23* 26* 25* 23*  CREATININE 1.02* 1.09* 0.96 1.02*  CALCIUM 10.2 10.4* 10.0 9.9   Liver Function Tests: No results for input(s): AST, ALT, ALKPHOS, BILITOT, PROT, ALBUMIN in the last 168 hours. No results for input(s): LIPASE, AMYLASE in the last 168 hours. No results for input(s): AMMONIA in the last 168 hours. CBC:  Recent Labs Lab 12/22/14 2015 12/24/14 0505 12/25/14 0403 12/26/14 0333  WBC 7.1 10.2 10.8* 11.6*  NEUTROABS 3.6  --   --  8.7*  HGB 12.7 11.3* 10.7* 11.5*  HCT 38.9 35.5* 33.6* 35.1*  MCV 90.0 90.1 90.1 90.9  PLT 430* 421* 416* 431*   Cardiac Enzymes: No results for input(s): CKTOTAL, CKMB, CKMBINDEX, TROPONINI in the last 168 hours. BNP (last 3 results) No results for input(s): BNP in the last 8760 hours.  ProBNP (last 3 results) No results for input(s): PROBNP in the last 8760  hours.  CBG:  Recent Labs Lab 12/25/14 0620 12/25/14 1128 12/25/14 2121 12/26/14 0645 12/26/14 1044  GLUCAP 102* 105* 85 90 125*    No results found for this or any previous visit (from the past 240 hour(s)).   Studies: Dg Lumbar Spine  2-3 Views  12/25/2014   CLINICAL DATA:  T11-12 and L3-4 laminectomy/microdiscectomy  EXAM: LUMBAR SPINE - 2-3 VIEW  COMPARISON:  None.  FINDINGS: Two intraoperative cross-table lateral radiographs of the lumbar spine are provided. There is degenerative disc disease at L3-4, L4-5 and L5-S1.  X-ray #1 demonstrates 2 metallic needles. The more superior needle tip projects over the inferior aspect of the T12 spinous process. The more inferior needle tip projects along the inferior aspect of the left inferior articulating facet of L3.  X-ray #2 demonstrates posterior tissue retractors. There is a metallic instruments superiorly which projects just posterior to the inferior articulating facet of T11. There is a tissue slammed projecting over the inferior margin of the L3 spinous process.  IMPRESSION: Intraoperative localization radiographs as detailed above.   Electronically Signed   By: Kathreen Devoid   On: 12/25/2014 16:15    Scheduled Meds: . acetaminophen  1,000 mg Oral 3 times per day  . amLODipine  10 mg Oral Daily  . brimonidine  1 drop Both Eyes BID  . dexamethasone  1 mg Oral 3 times per day  . docusate sodium  100 mg Oral BID  . dorzolamide-timolol  1 drop Both Eyes BID  . famotidine  20 mg Oral BID  . gemfibrozil  600 mg Oral BID AC  . heparin  5,000 Units Subcutaneous 3 times per day  . insulin aspart  0-9 Units Subcutaneous TID WC  . ketorolac  15 mg Intravenous 4 times per day  . latanoprost  1 drop Both Eyes QHS  . lisinopril  40 mg Oral Daily  . pilocarpine  1 drop Both Eyes QHS  . senna  1 tablet Oral BID  . sodium chloride  3 mL Intravenous Q12H  . traMADol  50 mg Oral BID   Continuous Infusions: . sodium chloride 250 mL (12/25/14 2015)    Principal Problem:   Spinal cord compression Active Problems:   Anxiety state   Glaucoma   IBS   Spinal stenosis   Essential hypertension   Spinal stenosis of thoracic region    Time spent: 35 mins    [facility] MD [facility] Pager [phone]. If 7PM-7AM, please contact night-coverage at [email or web address], password [facility] 12/26/2014, 11:41 AM  LOS: 4 days

## 2014-12-27 LAB — BASIC METABOLIC PANEL
Anion gap: 9 (ref 5–15)
BUN: 23 mg/dL — ABNORMAL HIGH (ref 6–20)
CHLORIDE: 107 mmol/L (ref 101–111)
CO2: 23 mmol/L (ref 22–32)
CREATININE: 1.07 mg/dL — AB (ref 0.44–1.00)
Calcium: 9.5 mg/dL (ref 8.9–10.3)
GFR calc non Af Amer: 47 mL/min — ABNORMAL LOW (ref >60)
GFR, EST AFRICAN AMERICAN: 54 mL/min — AB (ref >60)
GLUCOSE: 98 mg/dL (ref 65–99)
Potassium: 4.2 mmol/L (ref 3.5–5.1)
Sodium: 139 mmol/L (ref 135–145)

## 2014-12-27 LAB — GLUCOSE, CAPILLARY
GLUCOSE-CAPILLARY: 98 mg/dL (ref 65–99)
GLUCOSE-CAPILLARY: 104 mg/dL — AB (ref 65–99)
GLUCOSE-CAPILLARY: 148 mg/dL — AB (ref 65–99)
Glucose-Capillary: 107 mg/dL — ABNORMAL HIGH (ref 65–99)

## 2014-12-27 LAB — CBC
HCT: 33.1 % — ABNORMAL LOW (ref 36.0–46.0)
Hemoglobin: 10.7 g/dL — ABNORMAL LOW (ref 12.0–15.0)
MCH: 29.5 pg (ref 26.0–34.0)
MCHC: 32.3 g/dL (ref 30.0–36.0)
MCV: 91.2 fL (ref 78.0–100.0)
PLATELETS: 393 10*3/uL (ref 150–400)
RBC: 3.63 MIL/uL — ABNORMAL LOW (ref 3.87–5.11)
RDW: 13.8 % (ref 11.5–15.5)
WBC: 9.7 10*3/uL (ref 4.0–10.5)

## 2014-12-27 NOTE — Clinical Social Work Placement (Signed)
   CLINICAL SOCIAL WORK PLACEMENT  NOTE  Date:  12/27/2014  Patient Details  Name: Jodi Peters MRN: [id number] Date of Birth: 04-05-1930  Clinical Social Work is seeking post-discharge placement for this patient at the [place] level of care (*CSW will initial, date and re-position this form in  chart as items are completed):  No   Patient/family provided with [facility] Work Department's list of facilities offering this level of care within the geographic area requested by the patient (or if unable, by the patient's family).  Yes   Patient/family informed of their freedom to choose among providers that offer the needed level of care, that participate in Medicare, Medicaid or managed care program needed by the patient, have an available bed and are willing to accept the patient.  No   Patient/family informed of [place]'s ownership interest in [facility] and [facility], as well as of the fact that they are under no obligation to receive care at these facilities.  PASRR submitted to EDS on 12/27/14     PASRR number received on 12/27/14     Existing PASRR number confirmed on       FL2 transmitted to all facilities in geographic area requested by pt/family on 12/27/14     FL2 transmitted to all facilities within larger geographic area on 12/27/14     Patient informed that his/her managed care company has contracts with or will negotiate with certain facilities, including the following:            Patient/family informed of bed offers received.  Patient chooses bed at       Physician recommends and patient chooses bed at      Patient to be transferred to   on  .  Patient to be transferred to facility by       Patient family notified on   of transfer.  Name of family member notified:        PHYSICIAN       Additional Comment:    _______________________________________________ [name], LCSW 12/27/2014, 4:02 PM

## 2014-12-27 NOTE — Progress Notes (Addendum)
Patient ID: Jodi Peters, female   DOB: February 26, 1930, 81 y.o.   MRN: [id number] Patient doing well minimal leg pain some back pain but improving  Strength 5 out of 5 wound clean dry and intact  Mobilized with physical occupational therapy. Patient will need SNF placement for ongoing rehabilitation.

## 2014-12-27 NOTE — Social Work (Signed)
CSW spoke with physician who identified that patient was not ready for discharge. CSW will continue to work on discharge plan for when patient is medically ready for discharge.   [name] MSW, [place]

## 2014-12-27 NOTE — Progress Notes (Signed)
[facility] PROGRESS NOTE  Jodi Peters JGG:[id number] DOB: 25-Mar-1930 DOA: 12/22/2014 PCP: Rosita Fire, MD  Assessment/Plan: #1 severe stenosis with myelopathy at T11-T12/severe stenosis L3-L4 secondary to spondylosis/cord compression Patient with central disc herniation and moderate spondylosis causing severe stenosis with myelopathy at T11-T12. Patient had presented with worsening gait with difficulty ambulating. Patient has been evaluated by neurosurgery who are recommending surgical decompression at T11-T12 and also at L3-L4. Patient currently on Decadron. Patient status post bilateral laminotomies T11-T12 decompression of spinal canal, bilateral laminotomies L3-L4 decompression of lumbar spinal stenosis per Dr. Ellene Route 12/25/2014. Patient tolerated procedure well. PT/OT. Patient with some improvement with confusion after discontinuation of IV  dilaudid and oral Percocet. Patient with a bout of confusion late last night per son. Follow. Minimize narcotics. Per neurosurgery.  #2 hypertension Continue current dose of lisinopril 40 mg daily and Norvasc at 10 mg daily. Hydralazine when necessary.   #3 glaucoma Continue eyedrops of Alphagan, Cosopt, xalatan.  #4 anxiety Stable. Continue Xanax.  #5 GERD Continue Pepcid.  #6 prophylaxis Pepcid for GI prophylaxis. Heparin for DVT prophylaxis.  Code Status: Full Family Communication: updated patient, son at bedside.  Disposition Plan: Skilled nursing facility when stable neurosurgically wise.   Consultants:  Nuerosurgery: Dr Ellene Route 12/23/14  Procedures:  MRI L spine 12/22/14 Bilateral laminotomies T11-T12 decompression of spinal canal, bilateral laminotomies L3-L4 decompression of lumbar spinal stenosis-per Dr. Ellene Route neurosurgery 12/25/2014  Antibiotics:  None  HPI/Subjective: Patient sitting up in chair. Patient with some complaints of leg pain and back pain however states tolerable at this time. No chest pain. No  shortness of breath. Per son patient with some improvement with her confusion. Patient with some confusion late last night.  Objective: Filed Vitals:   12/27/14 0906  BP: 171/60  Pulse: 86  Temp: 98.7 F (37.1 C)  Resp: 18    Intake/Output Summary (Last 24 hours) at 12/27/14 1106 Last data filed at 12/27/14 0947  Gross per 24 hour  Intake      3 ml  Output   1750 ml  Net  -1747 ml   Filed Weights   12/22/14 1723  Weight: 63.957 kg (141 lb)    Exam:   General:  NAD  Cardiovascular: RRR  Respiratory: CTAB  Abdomen: Soft/NT/ND/+BS  Musculoskeletal: No c/c/e  Data Reviewed: Basic Metabolic Panel:  Recent Labs Lab 12/22/14 2050 12/24/14 0505 12/25/14 0403 12/26/14 0333 12/27/14 0359  NA 143 139 139 140 139  K 3.5 4.5 4.3 4.4 4.2  CL 106 109 110 108 107  CO2 27 22 23 25 23   GLUCOSE 99 115* 115* 97 98  BUN 23* 26* 25* 23* 23*  CREATININE 1.02* 1.09* 0.96 1.02* 1.07*  CALCIUM 10.2 10.4* 10.0 9.9 9.5   Liver Function Tests: No results for input(s): AST, ALT, ALKPHOS, BILITOT, PROT, ALBUMIN in the last 168 hours. No results for input(s): LIPASE, AMYLASE in the last 168 hours. No results for input(s): AMMONIA in the last 168 hours. CBC:  Recent Labs Lab 12/22/14 2015 12/24/14 0505 12/25/14 0403 12/26/14 0333 12/27/14 0359  WBC 7.1 10.2 10.8* 11.6* 9.7  NEUTROABS 3.6  --   --  8.7*  --   HGB 12.7 11.3* 10.7* 11.5* 10.7*  HCT 38.9 35.5* 33.6* 35.1* 33.1*  MCV 90.0 90.1 90.1 90.9 91.2  PLT 430* 421* 416* 431* 393   Cardiac Enzymes: No results for input(s): CKTOTAL, CKMB, CKMBINDEX, TROPONINI in the last 168 hours. BNP (last 3 results) No results for input(s): BNP in  the last 8760 hours.  ProBNP (last 3 results) No results for input(s): PROBNP in the last 8760 hours.  CBG:  Recent Labs Lab 12/26/14 0645 12/26/14 1044 12/26/14 1635 12/26/14 2136 12/27/14 0623  GLUCAP 90 125* 88 97 98    No results found for this or any previous visit  (from the past 240 hour(s)).   Studies: Dg Lumbar Spine 2-3 Views  12/25/2014   CLINICAL DATA:  T11-12 and L3-4 laminectomy/microdiscectomy  EXAM: LUMBAR SPINE - 2-3 VIEW  COMPARISON:  None.  FINDINGS: Two intraoperative cross-table lateral radiographs of the lumbar spine are provided. There is degenerative disc disease at L3-4, L4-5 and L5-S1.  X-ray #1 demonstrates 2 metallic needles. The more superior needle tip projects over the inferior aspect of the T12 spinous process. The more inferior needle tip projects along the inferior aspect of the left inferior articulating facet of L3.  X-ray #2 demonstrates posterior tissue retractors. There is a metallic instruments superiorly which projects just posterior to the inferior articulating facet of T11. There is a tissue slammed projecting over the inferior margin of the L3 spinous process.  IMPRESSION: Intraoperative localization radiographs as detailed above.   Electronically Signed   By: Kathreen Devoid   On: 12/25/2014 16:15    Scheduled Meds: . acetaminophen  1,000 mg Oral 3 times per day  . amLODipine  10 mg Oral Daily  . brimonidine  1 drop Both Eyes BID  . dexamethasone  1 mg Oral 3 times per day  . docusate sodium  100 mg Oral BID  . dorzolamide-timolol  1 drop Both Eyes BID  . famotidine  20 mg Oral BID  . gemfibrozil  600 mg Oral BID AC  . heparin  5,000 Units Subcutaneous 3 times per day  . insulin aspart  0-9 Units Subcutaneous TID WC  . latanoprost  1 drop Both Eyes QHS  . lisinopril  40 mg Oral Daily  . pilocarpine  1 drop Both Eyes QHS  . senna  1 tablet Oral BID  . sodium chloride  3 mL Intravenous Q12H  . traMADol  50 mg Oral BID   Continuous Infusions: . sodium chloride 250 mL (12/25/14 2015)    Principal Problem:   Spinal cord compression Active Problems:   Anxiety state   Glaucoma   IBS   Spinal stenosis   Essential hypertension   Spinal stenosis of thoracic region    Time spent: 35  mins    [facility] MD [facility] Pager [phone]. If 7PM-7AM, please contact night-coverage at [email or web address], password [email or web address] 12/27/2014, 11:06 AM  LOS: 5 days

## 2014-12-27 NOTE — Clinical Social Work Note (Signed)
Clinical Social Work Assessment  Patient Details  Name: Jodi Peters MRN: [id number] Date of Birth: [birth year]  Date of referral:  12/25/14               Reason for consult:  Facility Placement                Permission sought to share information with:  Facility Sport and exercise psychologist, Family Supports Permission granted to share information::  Yes, Verbal Permission Granted  Name::        Agency::     Relationship::     Contact Information:     Housing/Transportation Living arrangements for the past 2 months:  Single Family Home Source of Information:  Patient, Adult Children Patient Interpreter Needed:  None Criminal Activity/Legal Involvement Pertinent to Current Situation/Hospitalization:  No - Comment as needed Significant Relationships:  Adult Children Lives with:  Self Do you feel safe going back to the place where you live?  Yes Need for family participation in patient care:  Yes (Comment)  Care giving concerns:  Patient is independent and does not have 24 hour care in the home. Patient would like to go to [facility] for Rehab because it is close to home and family.    Social Worker assessment / plan:  CSW met with family. Family very supportive of patient getting necessary care for rehabilitation. Family prefers [facility] but willing for patient to be faxed out to [facility]. CSW supported family by sharing with them that he will initiate the process and another CSW will follow up on tomorrow.   Employment status:  Retired Nurse, adult PT Recommendations:  [place] / Referral to community resources:  [place]  Patient/Family's Response to care: CSW consulted with the physician who stated that patient was not ready for discharge, family was agreeable.   Patient/Family's Understanding of and Emotional Response to Diagnosis, Current Treatment, and Prognosis: Family and patient are looking  forward to the outcomes of rehab for patient as she moves back to her previous level of functioning before this hospitalization.   Emotional Assessment Appearance:  Appears stated age Attitude/Demeanor/Rapport:   (Pleasant, a little anxious about discharging today) Affect (typically observed):  Appropriate, Apprehensive Orientation:  Oriented to Self, Oriented to Place, Oriented to  Time, Oriented to Situation Alcohol / Substance use:  Not Applicable Psych involvement (Current and /or in the community):  No (Comment)  Discharge Needs  Concerns to be addressed:  Discharge Planning Concerns (Desires placement at [facility]) Readmission within the last 30 days:  No Current discharge risk:  None Barriers to Discharge:  No Barriers Identified   Jodi Lye, LCSW 12/27/2014, 3:08 PM

## 2014-12-28 ENCOUNTER — Encounter (HOSPITAL_COMMUNITY): Payer: Self-pay | Admitting: Neurological Surgery

## 2014-12-28 LAB — BASIC METABOLIC PANEL
Anion gap: 8 (ref 5–15)
BUN: 20 mg/dL (ref 6–20)
CALCIUM: 9.8 mg/dL (ref 8.9–10.3)
CO2: 25 mmol/L (ref 22–32)
Chloride: 108 mmol/L (ref 101–111)
Creatinine, Ser: 0.95 mg/dL (ref 0.44–1.00)
GFR calc Af Amer: >60 mL/min (ref >60)
GFR calc non Af Amer: 54 mL/min — ABNORMAL LOW (ref >60)
Glucose, Bld: 114 mg/dL — ABNORMAL HIGH (ref 65–99)
POTASSIUM: 4.4 mmol/L (ref 3.5–5.1)
Sodium: 141 mmol/L (ref 135–145)

## 2014-12-28 LAB — GLUCOSE, CAPILLARY
GLUCOSE-CAPILLARY: 105 mg/dL — AB (ref 65–99)
GLUCOSE-CAPILLARY: 101 mg/dL — AB (ref 65–99)
Glucose-Capillary: 106 mg/dL — ABNORMAL HIGH (ref 65–99)
Glucose-Capillary: 94 mg/dL (ref 65–99)

## 2014-12-28 LAB — CBC
HCT: 32.4 % — ABNORMAL LOW (ref 36.0–46.0)
Hemoglobin: 10.3 g/dL — ABNORMAL LOW (ref 12.0–15.0)
MCH: 28.7 pg (ref 26.0–34.0)
MCHC: 31.8 g/dL (ref 30.0–36.0)
MCV: 90.3 fL (ref 78.0–100.0)
Platelets: 456 10*3/uL — ABNORMAL HIGH (ref 150–400)
RBC: 3.59 MIL/uL — ABNORMAL LOW (ref 3.87–5.11)
RDW: 14.1 % (ref 11.5–15.5)
WBC: 9.8 10*3/uL (ref 4.0–10.5)

## 2014-12-28 MED ORDER — TRAMADOL HCL 50 MG PO TABS: 50.0000 mg | ORAL_TABLET | Freq: Two times a day (BID) | ORAL | Status: DC

## 2014-12-28 MED ORDER — HYDROCODONE-ACETAMINOPHEN 5-325 MG PO TABS: 1.0000 | ORAL_TABLET | ORAL | Status: DC | PRN

## 2014-12-28 MED ORDER — METOPROLOL TARTRATE 25 MG PO TABS: 12.5000 mg | ORAL_TABLET | Freq: Two times a day (BID) | ORAL | Status: DC

## 2014-12-28 MED ORDER — DOCUSATE SODIUM 100 MG PO CAPS: 100.0000 mg | ORAL_CAPSULE | Freq: Two times a day (BID) | ORAL | Status: DC

## 2014-12-28 MED ORDER — METOPROLOL TARTRATE 12.5 MG HALF TABLET
12.5000 mg | ORAL_TABLET | Freq: Two times a day (BID) | ORAL | Status: DC
  Administered 2014-12-28: 12.5 mg via ORAL
  Filled 2014-12-28: qty 1

## 2014-12-28 MED ORDER — LISINOPRIL 40 MG PO TABS: 40.0000 mg | ORAL_TABLET | Freq: Every day | ORAL | Status: DC

## 2014-12-28 MED ORDER — METOPROLOL TARTRATE 25 MG PO TABS
25.0000 mg | ORAL_TABLET | Freq: Two times a day (BID) | ORAL | Status: DC
  Administered 2014-12-28 – 2014-12-31 (×6): 25 mg via ORAL
  Filled 2014-12-28 (×6): qty 1

## 2014-12-28 MED ORDER — AMLODIPINE BESYLATE 10 MG PO TABS
10.0000 mg | ORAL_TABLET | Freq: Every day | ORAL | Status: DC
Start: 2014-12-28 — End: 2019-12-04

## 2014-12-28 MED ORDER — ALPRAZOLAM 0.25 MG PO TABS: 0.1250 mg | ORAL_TABLET | Freq: Two times a day (BID) | ORAL | Status: DC | PRN

## 2014-12-28 MED ORDER — METOPROLOL TARTRATE 25 MG PO TABS
25.0000 mg | ORAL_TABLET | Freq: Once | ORAL | Status: AC
Start: 2014-12-28 — End: 2014-12-28
  Administered 2014-12-28: 25 mg via ORAL
  Filled 2014-12-28: qty 1

## 2014-12-28 MED ORDER — DEXAMETHASONE 1 MG PO TABS
1.0000 mg | ORAL_TABLET | Freq: Three times a day (TID) | ORAL | Status: DC
Start: 2014-12-28 — End: 2014-12-31

## 2014-12-28 NOTE — Anesthesia Postprocedure Evaluation (Signed)
Anesthesia Post Note  Patient: Jodi Peters  Procedure(s) Performed: Procedure(s) (LRB): Thoracic eleven-twelve Laminectomy/Diskectomy; Lumbar three-four Laminectomy/Diskectomy (N/A)  Anesthesia type: General  Patient location: PACU  Post pain: Pain level controlled and Adequate analgesia  Post assessment: Post-op Vital signs reviewed, Patient's Cardiovascular Status Stable, Respiratory Function Stable, Patent Airway and Pain level controlled  Last Vitals:  Filed Vitals:   12/28/14 2124  BP: 182/62  Pulse: 105  Temp: 36.9 C  Resp: 18    Post vital signs: Reviewed and stable  Level of consciousness: awake, alert  and oriented  Complications: No apparent anesthesia complications

## 2014-12-28 NOTE — Discharge Summary (Addendum)
Physician Discharge Summary  Jodi Peters EGB:[id number] DOB: 1930/06/07 DOA: 12/22/2014  PCP: Rosita Fire, MD  Admit date: 12/22/2014 Discharge date: 12/28/2014  Time spent: 70 minutes  Recommendations for Outpatient Follow-up:  1. Patient be discharged to a skilled nursing facility in stable and improved condition. Patient will follow-up with M.D. at the skilled nursing facility. Patient needs her blood pressure followed and blood pressure medications titrated for better blood pressure control. Patient will need a basic metabolic profile done in 1 week to follow-up on electrolytes and renal function. 2. Follow-up with Dr. Ellene Route of neurosurgery in 2-3 weeks. 3. Patient did have urinary retention during the hospitalization patient will be discharged with a Foley bag. Patient will need a voiding trial at the facility in about 3-4 days.  Discharge Diagnoses:  Principal Problem:   Spinal cord compression Active Problems:   Anxiety state   Glaucoma   IBS   Spinal stenosis   Essential hypertension   Spinal stenosis of thoracic region   Discharge Condition: Stable and improved  Diet recommendation: Regular.  Filed Weights   12/22/14 1723  Weight: 63.957 kg (141 lb)    History of present illness:  Per Dr Macy Mis is a 81 y.o. female  Patient had presented with thigh pain with radiation bilaterally to her calves. Muscle relaxer and tylenol w/ minimal improvement. Described as a cramping sensation. Started 2 weeks prior to admission. Intermittent initially but progressive and now constant. Symptoms are worse when ambulating. Patient able to walk shorter and shorter distances over the past 2 weeks due to worsening symptoms. 3 days prior to admission patient  developed lower central back pain. Symptoms improved when patient is resting in bed. Denied any lower back trauma, loss of bowel or bladder function was.    Hospital Course:  #1 severe stenosis with myelopathy at  T11-T12/severe stenosis L3-L4 secondary to spondylosis/cord compression Patient with central disc herniation and moderate spondylosis causing severe stenosis with myelopathy at T11-T12. Patient had presented with worsening gait with difficulty ambulating. Patient has been evaluated by neurosurgery who are recommending surgical decompression at T11-T12 and also at L3-L4. Patient was started on Decadron with some clinical improvement. Neurosurgery was consulted and patient was seen in consultation by Dr. Ellene Route. Patient subsequently underwent bilateral laminotomies T11-T12 decompression of spinal canal, bilateral laminotomies L3-L4 decompression of lumbar spinal stenosis per Dr. Ellene Route 12/25/2014. Patient tolerated procedure well. PT/OT. Patient postop was noted to have some confusion which was felt to be secondary to narcotics. Narcotics was subsequently discontinued with improvement in her confusion and back to her baseline. Patient be discharged in stable and improved condition and is to follow-up with Dr. Ellene Route, neurosurgery in 2-3 weeks.   #2 hypertension Patient was noted to be hypertensive on admission. Patient's Norvasc was increased to 10 mg daily and lisinopril was increased to 40 mg daily. Lopressor was subsequently added to patient's blood pressure regimen with improvement in her blood pressure. Patient was also placed on hydralazine as needed. Patient be discharged to a skilled nursing facility and will follow-up as outpatient.  #3 glaucoma Continued on home regimen of eyedrops of Alphagan, Cosopt, xalatan.  #4 anxiety Stable. Continued on Xanax.  #5 GERD Continued on Pepcid.  #6 urinary retention Patient noted to have urinary retention during the hospitalization likely secondary to problem #1. Voiding trial was done however patient had greater than 300 mL of residual on bladder scan. Foley catheter will be reinserted and patient will be discharged to a skilled  facility with a Foley bag.  Patient on need a voiding trial done at a facility in about 3-4 days.  Procedures:  MRI L spine 12/22/14 Bilateral laminotomies T11-T12 decompression of spinal canal, bilateral laminotomies L3-L4 decompression of lumbar spinal stenosis-per Dr. Ellene Route neurosurgery 12/25/2014   Consultations:  Neurosurgery: Dr Ellene Route 12/23/14    Discharge Exam: Filed Vitals:   12/28/14 1434  BP: 191/84  Pulse: 72  Temp: 98.1 F (36.7 C)  Resp: 18    General: NAD Cardiovascular: RRR Respiratory: CTAB  Discharge Instructions   Discharge Instructions    Diet general    Complete by:  As directed      Discharge instructions    Complete by:  As directed   Follow up with Dr Ellene Route in 2-3 weeks.     Increase activity slowly    Complete by:  As directed           Current Discharge Medication List    START taking these medications   Details  dexamethasone (DECADRON) 1 MG tablet Take 1 tablet (1 mg total) by mouth every 8 (eight) hours. Qty: 90 tablet, Refills: 0    HYDROcodone-acetaminophen (NORCO/VICODIN) 5-325 MG per tablet Take 1 tablet by mouth every 4 (four) hours as needed for moderate pain (mild pain). Qty: 20 tablet, Refills: 0    metoprolol tartrate (LOPRESSOR) 25 MG tablet Take 0.5 tablets (12.5 mg total) by mouth 2 (two) times daily. Qty: 60 tablet, Refills: 0      CONTINUE these medications which have CHANGED   Details  ALPRAZolam (XANAX) 0.25 MG tablet Take 0.5-1 tablets (0.125-0.25 mg total) by mouth 2 (two) times daily as needed for anxiety. Qty: 20 tablet, Refills: 0    amLODipine (NORVASC) 10 MG tablet Take 1 tablet (10 mg total) by mouth daily. Qty: 30 tablet, Refills: 0    docusate sodium (COLACE) 100 MG capsule Take 1 capsule (100 mg total) by mouth 2 (two) times daily. Qty: 10 capsule, Refills: 0    lisinopril (PRINIVIL,ZESTRIL) 40 MG tablet Take 1 tablet (40 mg total) by mouth daily. Qty: 30 tablet, Refills: 0    traMADol (ULTRAM) 50 MG tablet Take 1  tablet (50 mg total) by mouth 2 (two) times daily. Qty: 60 tablet, Refills: 0      CONTINUE these medications which have NOT CHANGED   Details  acetaminophen (TYLENOL) 500 MG tablet Take 500 mg by mouth every 6 (six) hours as needed for mild pain or moderate pain.    aspirin EC 81 MG tablet Take 81 mg by mouth every morning.     brimonidine (ALPHAGAN) 0.15 % ophthalmic solution Place 1 drop into both eyes 2 (two) times daily.     cyclobenzaprine (FLEXERIL) 5 MG tablet Take 1 tablet (5 mg total) by mouth 2 (two) times daily as needed for muscle spasms. Qty: 15 tablet, Refills: 0    diphenhydrAMINE (BENADRYL) 25 mg capsule Take 25 mg by mouth at bedtime as needed for allergies.     dorzolamide-timolol (COSOPT) 22.3-6.8 MG/ML ophthalmic solution 1 drop 2 (two) times daily.      gemfibrozil (LOPID) 600 MG tablet Take 600 mg by mouth 2 (two) times daily before a meal.     latanoprost (XALATAN) 0.005 % ophthalmic solution Place 1 drop into both eyes at bedtime.    Multiple Vitamin (MULTIVITAMIN) tablet Take 1 tablet by mouth daily.    pilocarpine (PILOCAR) 4 % ophthalmic solution Place 1 drop into both eyes at bedtime.  Polyethyl Glycol-Propyl Glycol (SYSTANE OP) Apply 1 drop to eye as needed (for dry eye relief).     Simethicone (GAS-X PO) Take 1 tablet by mouth daily as needed (for gas relief).     Vitamin D, Ergocalciferol, (DRISDOL) 50000 UNITS CAPS capsule Take 50,000 Units by mouth every 30 (thirty) days.     meclizine (ANTIVERT) 25 MG tablet Take 12.5 mg by mouth 3 (three) times daily as needed for dizziness.        Allergies  Allergen Reactions  . Penicillins Rash   Follow-up Information    Follow up with Earleen Newport, MD. Schedule an appointment as soon as possible for a visit in 2 weeks.   Specialty:  Neurosurgery   Why:  f/u in 2-3 weeks.   Contact information:   [address] [place] [address] [phone]        The results of  significant diagnostics from this hospitalization (including imaging, microbiology, ancillary and laboratory) are listed below for reference.    Significant Diagnostic Studies: Dg Lumbar Spine 2-3 Views  12/25/2014   CLINICAL DATA:  T11-12 and L3-4 laminectomy/microdiscectomy  EXAM: LUMBAR SPINE - 2-3 VIEW  COMPARISON:  None.  FINDINGS: Two intraoperative cross-table lateral radiographs of the lumbar spine are provided. There is degenerative disc disease at L3-4, L4-5 and L5-S1.  X-ray #1 demonstrates 2 metallic needles. The more superior needle tip projects over the inferior aspect of the T12 spinous process. The more inferior needle tip projects along the inferior aspect of the left inferior articulating facet of L3.  X-ray #2 demonstrates posterior tissue retractors. There is a metallic instruments superiorly which projects just posterior to the inferior articulating facet of T11. There is a tissue slammed projecting over the inferior margin of the L3 spinous process.  IMPRESSION: Intraoperative localization radiographs as detailed above.   Electronically Signed   By: Kathreen Devoid   On: 12/25/2014 16:15   Mr Lumbar Spine Wo Contrast  12/22/2014   CLINICAL DATA:  Lumbago/low back pain. Cramping in both thighs since Wednesday. Acute onset of lumbago/low back pain 2 days ago. History of prior lumbar surgery and personal history of breast cancer.  EXAM: MRI LUMBAR SPINE WITHOUT CONTRAST  TECHNIQUE: Multiplanar, multisequence MR imaging of the lumbar spine was performed. No intravenous contrast was administered.  COMPARISON:  02/22/2010 MRI of the lumbar spine.  FINDINGS: Segmentation: Numbering used on prior exam preserved.  Alignment: Mild levoconvex curve of the lumbar spine with the apex at L2-L3.  3 mm of grade I anterolisthesis of L3 on L4 associated with collapse of the disc space and facet arthrosis.  Vertebrae: Ankylosis of L4-L5, presumably postsurgical and degenerative. Severe degenerative endplate  changes are present, most pronounced at L3-L4. Discogenic endplate edema is present throughout the thoracolumbar spine. The endplate edema has progressed compared to the prior MRI from 2011.  Conus medullaris: Normal termination at T12.  Paraspinal tissues: LEFT retroperitoneal cyst is compatible with a simple renal cyst. This has probably enlarged compared to the prior exam from 2011.  Mild biliary ductal dilation is present, partially visible. This is most commonly associated with cholecystectomy.  Disc levels:  Disc Signal: Ossification of the L4-L5 disc. Diffuse disc desiccation. Disc degeneration has progressed most severely at L3-L4.  T11-T12: Severe disc degeneration. There is severe central stenosis which has developed since the prior exam. Cord compression is present with low level cord edema just proximal to the conus medullaris.  T12-L1: Facet arthrosis is present.  Minimal disc bulging and disc desiccation but no significant stenosis.  L1-L2: Chronic LEFT central disc extrusion is present with cranial migration of disc material. This produces mild central stenosis. Bilateral facet arthrosis is present. Neural foramina appear adequately patent.  L2-L3: Mild central stenosis associated with broad-based disc bulging, ligamentum flavum redundancy and facet arthrosis. Mild bilateral foraminal stenosis.  L3-L4: Severe central stenosis with complete effacement of the subarachnoid space and bilateral subarticular stenosis. Although the degenerative disease was severe at the time of the prior exam, this has progressed. Moderate bilateral foraminal stenosis is also present. Progressive broad-based disc extrusion.  L4-L5: Ankylosis.  LEFT laminotomy mild recurrent central stenosis.  L5-S1: Disc desiccation and degeneration. Broad-based endplate osteophytes. Moderate bilateral foraminal stenosis. Large marginal osteophytes in the lateral region that are similar to the prior exam. When comparing to prior, disc  degeneration has progressed L5-S1 stenosis appears similar.  IMPRESSION: 1. T11-T12 disc protrusion with ligamentum flavum redundancy producing severe central stenosis with cord compression and central cord edema just proximal to the conus medullaris. Expedited surgical consultation recommended. These results will be called to the ordering clinician or representative by the Radiologist Assistant, and communication documented in the PACS or zVision Dashboard. 2. Progressive L3-L4 severe central, subarticular and moderate bilateral foraminal stenosis. 3. L4-L5 ankylosis and LEFT laminotomy. 4. Mild progression of L5-S1 degenerative disease with similar degree of stenosis compared to prior exam from 2011.   Electronically Signed   By: Dereck Ligas M.D.   On: 12/22/2014 15:37    Microbiology: No results found for this or any previous visit (from the past 240 hour(s)).   Labs: Basic Metabolic Panel:  Recent Labs Lab 12/24/14 0505 12/25/14 0403 12/26/14 0333 12/27/14 0359 12/28/14 0600  NA 139 139 140 139 141  K 4.5 4.3 4.4 4.2 4.4  CL 109 110 108 107 108  CO2 22 23 25 23 25   GLUCOSE 115* 115* 97 98 114*  BUN 26* 25* 23* 23* 20  CREATININE 1.09* 0.96 1.02* 1.07* 0.95  CALCIUM 10.4* 10.0 9.9 9.5 9.8   Liver Function Tests: No results for input(s): AST, ALT, ALKPHOS, BILITOT, PROT, ALBUMIN in the last 168 hours. No results for input(s): LIPASE, AMYLASE in the last 168 hours. No results for input(s): AMMONIA in the last 168 hours. CBC:  Recent Labs Lab 12/22/14 2015 12/24/14 0505 12/25/14 0403 12/26/14 0333 12/27/14 0359 12/28/14 0600  WBC 7.1 10.2 10.8* 11.6* 9.7 9.8  NEUTROABS 3.6  --   --  8.7*  --   --   HGB 12.7 11.3* 10.7* 11.5* 10.7* 10.3*  HCT 38.9 35.5* 33.6* 35.1* 33.1* 32.4*  MCV 90.0 90.1 90.1 90.9 91.2 90.3  PLT 430* 421* 416* 431* 393 456*   Cardiac Enzymes: No results for input(s): CKTOTAL, CKMB, CKMBINDEX, TROPONINI in the last 168 hours. BNP: BNP (last 3  results) No results for input(s): BNP in the last 8760 hours.  ProBNP (last 3 results) No results for input(s): PROBNP in the last 8760 hours.  CBG:  Recent Labs Lab 12/27/14 1106 12/27/14 1617 12/27/14 2140 12/28/14 0628 12/28/14 1138  GLUCAP 104* 107* 148* 105* 106*       Signed:  THOMPSON,DANIEL MD [facility] 12/28/2014, 2:47 PM

## 2014-12-28 NOTE — Progress Notes (Signed)
Foley cath reinserted per MD's order due to patient's difficulty passing urine. 800 ml drained in bag at initial insertion. Patient tolerated well.

## 2014-12-28 NOTE — Progress Notes (Signed)
Physical Therapy Treatment Patient Details Name: Jodi Peters MRN: [id number] DOB: 07-04-1929 Today's Date: 12/28/2014    History of Present Illness 81 y.o. female admitted with severe stenosis with myelopathy at T11-T12/severe stenosis L3-L4 secondary to spondylosis/cord compression. now s/p decompression 7/15.    PT Comments    Patient seen for mobility today. Patient tolerated transfer to [facility], and minimal steps to chair. Limited by fatigue and desire to eat breakfast. Did tolerated some there-ex with cues. OF NOTE: patient reports that her right eye had bothered her in the middle of the night, but left under eye appears swollen. Nsg notified.   Follow Up Recommendations  SNF;Supervision/Assistance - 24 hour     Equipment Recommendations  None recommended by PT    Recommendations for Other Services       Precautions / Restrictions Precautions Precautions: Fall Restrictions Weight Bearing Restrictions: No    Mobility  Bed Mobility Overal bed mobility: Needs Assistance Bed Mobility: Rolling;Sidelying to Sit Rolling: Min assist Sidelying to sit: Min guard       General bed mobility comments: VCs for technique of log roll (performed x2)   Transfers Overall transfer level: Needs assistance Equipment used: 1 person hand held assist   Sit to Stand: Min assist Stand pivot transfers: Min assist       General transfer comment: min assist to pivot to bedside commode  Ambulation/Gait Ambulation/Gait assistance: Min assist Ambulation Distance (Feet): 6 Feet Assistive device: 1 person hand held assist Gait Pattern/deviations: Step-to pattern;Decreased step length - right;Decreased step length - left;Decreased stride length;Decreased dorsiflexion - right;Decreased dorsiflexion - left;Ataxic;Trunk flexed     General Gait Details: limited by fatigue and desire to eat breakfast, deferred further ambulation at this time   Stairs            Wheelchair Mobility     Modified Rankin (Stroke Patients Only)       Balance Overall balance assessment: Needs assistance Sitting-balance support: No upper extremity supported Sitting balance-Leahy Scale: Fair     Standing balance support: Bilateral upper extremity supported Standing balance-Leahy Scale: Poor                      Cognition Arousal/Alertness: Awake/alert Behavior During Therapy: WFL for tasks assessed/performed Overall Cognitive Status: Within Functional Limits for tasks assessed                      Exercises General Exercises - Lower Extremity Ankle Circles/Pumps:  (AROM, BLEs Ankle pumps, LAQs, ABD/ADD sitting in chair)    General Comments General comments (skin integrity, edema, etc.): increased edema noted under left eye      Pertinent Vitals/Pain Pain Assessment: Faces Faces Pain Scale: Hurts even more Pain Location: back Pain Descriptors / Indicators: Sore Pain Intervention(s): Monitored during session;Limited activity within patient's tolerance;Repositioned;Relaxation    Home Living                      Prior Function            PT Goals (current goals can now be found in the care plan section) Acute Rehab PT Goals PT Goal Formulation: With patient Time For Goal Achievement: 01/06/15 Potential to Achieve Goals: Good Progress towards PT goals: Progressing toward goals    Frequency  Min 3X/week    PT Plan Current plan remains appropriate    Co-evaluation             End of  Session Equipment Utilized During Treatment: Gait belt Activity Tolerance: Patient limited by fatigue Patient left: in chair;with call bell/phone within reach;with chair alarm set     Time: [phone] PT Time Calculation (min) (ACUTE ONLY): 22 min  Charges:  $Therapeutic Activity: 8-22 mins                    G CodesDuncan Dull 01/11/15, 10:09 AM Alben Deeds, PT DPT  [phone]

## 2014-12-28 NOTE — Progress Notes (Signed)
Patient ID: Jodi Peters, female   DOB: [birth year], 81 y.o.   MRN: [id number] Are stable Motor function appears intact Patient is starting to ambulate Patient to go to [facility] to undergo further rehabilitation. Wean off and stop spirits.

## 2014-12-28 NOTE — Clinical Social Work Placement (Addendum)
   CLINICAL SOCIAL WORK PLACEMENT  NOTE  Date:  12/28/2014  Patient Details  Name: Jodi Peters MRN: [id number] Date of Birth: May 24, 1930  Clinical Social Work is seeking post-discharge placement for this patient at the [place] level of care (*CSW will initial, date and re-position this form in  chart as items are completed):  No   Patient/family provided with [place] Work Department's list of facilities offering this level of care within the geographic area requested by the patient (or if unable, by the patient's family).  Yes   Patient/family informed of their freedom to choose among providers that offer the needed level of care, that participate in Medicare, Medicaid or managed care program needed by the patient, have an available bed and are willing to accept the patient.  No   Patient/family informed of [facility]'s ownership interest in [facility] and [facility], as well as of the fact that they are under no obligation to receive care at these facilities.  PASRR submitted to EDS on 12/27/14     PASRR number received on 12/27/14     Existing PASRR number confirmed on       FL2 transmitted to all facilities in geographic area requested by pt/family on 12/27/14     FL2 transmitted to all facilities within larger geographic area on 12/27/14     Patient informed that his/her managed care company has contracts with or will negotiate with certain facilities, including the following:            Patient/family informed of bed offers received.  12/28/2014   Patient chooses bed at     [facility]  Physician recommends and patient chooses bed at      SNF Patient to be transferred to   on  . 12/28/2014  Patient to be transferred to facility by    [facility]   Patient family notified on   of transfer.  Yes, family,  Evette Doffing (son)  Name of family member notified:      [phone]  PHYSICIAN       Additional Comment:     _______________________________________________ Lilly Cove, LCSW 12/28/2014, 11:01 AM

## 2014-12-28 NOTE — Progress Notes (Addendum)
1402pm:  Patient is medically cleared for DC per CM, MD, and neurosurgery. Patient is aware and agreeable and asked to call her son Evette Doffing. Family is asking to have someone call him once DC Summary is available and all paperwork completed. Patient can come any time per [facility], and aware of DC.  Transport via car or [facility] depending on family preference.  SW to call back.   Patient has bed offers at [facility], [place], [place], [place], and [place].  First choice is [facility] and call placed to [facility] and spoke to [place] who reports family has already spoken to facility and all in agreement.  LCSW paged MD to update regarding plan and if patient is medically stable for discharged.   Lane Hacker, MSW Clinical Social Work: Emergency Room [phone]

## 2014-12-28 NOTE — Progress Notes (Signed)
D/C cancelled per RN. [place] notified and ambulance notified.  RN to call family.

## 2014-12-29 DIAGNOSIS — N390 Urinary tract infection, site not specified: Secondary | ICD-10-CM | POA: Clinically undetermined

## 2014-12-29 DIAGNOSIS — N3001 Acute cystitis with hematuria: Secondary | ICD-10-CM

## 2014-12-29 LAB — GLUCOSE, CAPILLARY
GLUCOSE-CAPILLARY: 103 mg/dL — AB (ref 65–99)
Glucose-Capillary: 114 mg/dL — ABNORMAL HIGH (ref 65–99)
Glucose-Capillary: 183 mg/dL — ABNORMAL HIGH (ref 65–99)
Glucose-Capillary: 82 mg/dL (ref 65–99)

## 2014-12-29 LAB — URINALYSIS, ROUTINE W REFLEX MICROSCOPIC
BILIRUBIN URINE: NEGATIVE
Glucose, UA: NEGATIVE mg/dL
Ketones, ur: NEGATIVE mg/dL
Nitrite: NEGATIVE
PH: 7 (ref 5.0–8.0)
Protein, ur: 30 mg/dL — AB
Specific Gravity, Urine: 1.01 (ref 1.005–1.030)
UROBILINOGEN UA: 0.2 mg/dL (ref 0.0–1.0)

## 2014-12-29 LAB — URINE MICROSCOPIC-ADD ON

## 2014-12-29 MED ORDER — CEFTRIAXONE SODIUM IN DEXTROSE 20 MG/ML IV SOLN
1.0000 g | INTRAVENOUS | Status: DC
  Administered 2014-12-29 – 2014-12-30 (×2): 1 g via INTRAVENOUS
  Filled 2014-12-29 (×3): qty 50

## 2014-12-29 NOTE — Progress Notes (Signed)
[facility] PROGRESS NOTE  Jodi Peters [id number]:[id number] DOB: [birth year] DOA: 12/22/2014 PCP: Rosita Fire, MD  Assessment/Plan: #1 severe stenosis with myelopathy at T11-T12/severe stenosis L3-L4 secondary to spondylosis/cord compression Patient with central disc herniation and moderate spondylosis causing severe stenosis with myelopathy at T11-T12. Patient had presented with worsening gait with difficulty ambulating. Patient has been evaluated by neurosurgery who are recommending surgical decompression at T11-T12 and also at L3-L4. Patient currently on Decadron. Patient status post bilateral laminotomies T11-T12 decompression of spinal canal, bilateral laminotomies L3-L4 decompression of lumbar spinal stenosis per Dr. Ellene Route 12/25/2014. Patient tolerated procedure well. PT/OT. Patient with some improvement with confusion after discontinuation of IV  dilaudid and oral Percocet. Follow. Minimize narcotics. Per neurosurgery.  #2 hypertension Continue current dose of lisinopril 40 mg daily and Norvasc at 10 mg daily. Will place on Lopressor 25 mg twice daily for better blood pressure control. Hydralazine when necessary.   #3 probable UTI Patient complains of lethargy and not feeling well. Urinalysis done had moderate leukocytes negative nitrites positive RBCs, 3-6 WBCs. Urine cultures are pending. Will place empirically on IV Rocephin. Follow.  #4 glaucoma Continue eyedrops of Alphagan, Cosopt, xalatan.  #5 anxiety Stable. Continue Xanax.  #6 GERD Continue Pepcid.  #7 prophylaxis Pepcid for GI prophylaxis. Heparin for DVT prophylaxis.  Code Status: Full Family Communication: updated patient, no family at bedside.  Disposition Plan: Skilled nursing facility when blood pressure improved and urine cultures back and patient clinically improved.   Consultants:  Nuerosurgery: Dr Ellene Route 12/23/14  Procedures:  MRI L spine 12/22/14 Bilateral laminotomies T11-T12 decompression of spinal  canal, bilateral laminotomies L3-L4 decompression of lumbar spinal stenosis-per Dr. Ellene Route neurosurgery 12/25/2014  Antibiotics:  IV Rocephin 12/29/2014  HPI/Subjective: Patient states not feeling well today. Patient noted to have failed 40 trial yesterday and a such Foley catheter was replaced. Patient denies any chest pain. No shortness of breath.  Objective: Filed Vitals:   12/29/14 1420  BP: 156/67  Pulse: 74  Temp: 98.6 F (37 C)  Resp: 18    Intake/Output Summary (Last 24 hours) at 12/29/14 1435 Last data filed at 12/29/14 0010  Gross per 24 hour  Intake      0 ml  Output   1150 ml  Net  -1150 ml   Filed Weights   12/22/14 1723  Weight: 63.957 kg (141 lb)    Exam:   General:  NAD  Cardiovascular: RRR  Respiratory: CTAB  Abdomen: Soft/NT/ND/+BS  Musculoskeletal: No c/c/e  Data Reviewed: Basic Metabolic Panel:  Recent Labs Lab 12/24/14 0505 12/25/14 0403 12/26/14 0333 12/27/14 0359 12/28/14 0600  NA 139 139 140 139 141  K 4.5 4.3 4.4 4.2 4.4  CL 109 110 108 107 108  CO2 22 23 25 23 25   GLUCOSE 115* 115* 97 98 114*  BUN 26* 25* 23* 23* 20  CREATININE 1.09* 0.96 1.02* 1.07* 0.95  CALCIUM 10.4* 10.0 9.9 9.5 9.8   Liver Function Tests: No results for input(s): AST, ALT, ALKPHOS, BILITOT, PROT, ALBUMIN in the last 168 hours. No results for input(s): LIPASE, AMYLASE in the last 168 hours. No results for input(s): AMMONIA in the last 168 hours. CBC:  Recent Labs Lab 12/22/14 2015 12/24/14 0505 12/25/14 0403 12/26/14 0333 12/27/14 0359 12/28/14 0600  WBC 7.1 10.2 10.8* 11.6* 9.7 9.8  NEUTROABS 3.6  --   --  8.7*  --   --   HGB 12.7 11.3* 10.7* 11.5* 10.7* 10.3*  HCT 38.9 35.5* 33.6* 35.1* 33.1* 32.4*  MCV 90.0 90.1 90.1 90.9 91.2 90.3  PLT 430* 421* 416* 431* 393 456*   Cardiac Enzymes: No results for input(s): CKTOTAL, CKMB, CKMBINDEX, TROPONINI in the last 168 hours. BNP (last 3 results) No results for input(s): BNP in the last 8760  hours.  ProBNP (last 3 results) No results for input(s): PROBNP in the last 8760 hours.  CBG:  Recent Labs Lab 12/28/14 1138 12/28/14 1624 12/28/14 2112 12/29/14 0625 12/29/14 1123  GLUCAP 106* 94 101* 103* 114*    No results found for this or any previous visit (from the past 240 hour(s)).   Studies: No results found.  Scheduled Meds: . acetaminophen  1,000 mg Oral 3 times per day  . amLODipine  10 mg Oral Daily  . brimonidine  1 drop Both Eyes BID  . cefTRIAXone (ROCEPHIN)  IV  1 g Intravenous Q24H  . docusate sodium  100 mg Oral BID  . dorzolamide-timolol  1 drop Both Eyes BID  . famotidine  20 mg Oral BID  . gemfibrozil  600 mg Oral BID AC  . heparin  5,000 Units Subcutaneous 3 times per day  . insulin aspart  0-9 Units Subcutaneous TID WC  . latanoprost  1 drop Both Eyes QHS  . lisinopril  40 mg Oral Daily  . metoprolol tartrate  25 mg Oral BID  . pilocarpine  1 drop Both Eyes QHS  . senna  1 tablet Oral BID  . traMADol  50 mg Oral BID   Continuous Infusions:    Principal Problem:   Spinal cord compression Active Problems:   Anxiety state   Glaucoma   IBS   Spinal stenosis   Essential hypertension   Spinal stenosis of thoracic region   UTI (urinary tract infection)    Time spent: 35 mins    [facility] MD [facility] Pager [phone]. If 7PM-7AM, please contact night-coverage at [email or web address], password [email or web address] 12/29/2014, 2:35 PM  LOS: 7 days

## 2014-12-29 NOTE — Progress Notes (Signed)
Occupational Therapy Treatment Patient Details Name: Jodi Peters MRN: [id number] DOB: Oct 30, 1929 Today's Date: 12/29/2014    History of present illness 81 y.o. female admitted with severe stenosis with myelopathy at T11-T12/severe stenosis L3-L4 secondary to spondylosis/cord compression. now s/p decompression 7/15.   OT comments  Pt progressing towards acute OT goals. Focus of session was in-room ambulation, toilet transfer, and grooming tasks as detailed below. D/c plan remains appropriate.   Follow Up Recommendations  SNF    Equipment Recommendations  Other (comment) (defer to next venue)    Recommendations for Other Services      Precautions / Restrictions Precautions Precautions: Fall Restrictions Weight Bearing Restrictions: No       Mobility Bed Mobility               General bed mobility comments: in recliner  Transfers Overall transfer level: Needs assistance Equipment used: Rolling walker (2 wheeled) Transfers: Sit to/from Stand Sit to Stand: Min assist;Min guard         General transfer comment: min guard from elevated seat surface (BSC); min A from lower seat surface    Balance Overall balance assessment: Needs assistance Sitting-balance support: No upper extremity supported;Feet supported Sitting balance-Leahy Scale: Fair     Standing balance support: Bilateral upper extremity supported;During functional activity Standing balance-Leahy Scale: Poor Standing balance comment: rw for balance                   ADL Overall ADL's : Needs assistance/impaired     Grooming: Set up;Supervision/safety;Sitting;Wash/dry hands                   Toilet Transfer: Min guard;Ambulation;BSC;RW           Functional mobility during ADLs: Min guard;Rolling walker General ADL Comments: Pt ambualted to bathroom and completed transfer to [facility] and washed hands in seated position. Min guard for safety with pt reporting weakness in B  thighs.Educated on BAT precautions.      Vision                     Perception     Praxis      Cognition   Behavior During Therapy: WFL for tasks assessed/performed Overall Cognitive Status: Within Functional Limits for tasks assessed                       Extremity/Trunk Assessment               Exercises     Shoulder Instructions       General Comments      Pertinent Vitals/ Pain       Pain Assessment: Faces Faces Pain Scale: Hurts even more Pain Location: surgical site, back Pain Descriptors / Indicators: Aching;Grimacing;Sore Pain Intervention(s): Limited activity within patient's tolerance;Monitored during session;Repositioned  Home Living                                          Prior Functioning/Environment              Frequency Min 2X/week     Progress Toward Goals  OT Goals(current goals can now be found in the care plan section)  Progress towards OT goals: Progressing toward goals  Acute Rehab OT Goals OT Goal Formulation: With patient Time For Goal Achievement: 01/07/15 Potential to Achieve Goals: Good ADL  Goals Pt Will Perform Grooming: with set-up;sitting Pt Will Perform Upper Body Bathing: with set-up;sitting Pt Will Perform Lower Body Bathing: with min guard assist;sit to/from stand Pt Will Transfer to Toilet: with min guard assist;ambulating;regular height toilet Additional ADL Goal #1: Pt will complete bed mobility supervision level without bed rails  Plan Discharge plan remains appropriate    Co-evaluation                 End of Session Equipment Utilized During Treatment: Gait belt;Rolling walker   Activity Tolerance Patient tolerated treatment well   Patient Left in chair;with call bell/phone within reach;with chair alarm set   Nurse Communication      Functional Assessment Tool Used: clinical judgement Functional Limitation: Self care Self Care Current Status (W9798): At  least 1 percent but less than 20 percent impaired, limited or restricted Self Care Goal Status (X2119): 0 percent impaired, limited or restricted   Time: 4174-0814 OT Time Calculation (min): 23 min  Charges: OT G-codes **NOT FOR INPATIENT CLASS** Functional Assessment Tool Used: clinical judgement Functional Limitation: Self care Self Care Current Status (G8185): At least 1 percent but less than 20 percent impaired, limited or restricted Self Care Goal Status (U3149): 0 percent impaired, limited or restricted OT General Charges $OT Visit: 1 Procedure OT Treatments $Self Care/Home Management : 23-37 mins  [name] 12/29/2014, 12:50 PM

## 2014-12-29 NOTE — Progress Notes (Addendum)
Patient ID: Jodi Peters, female   DOB: 05-10-1930, 81 y.o.   MRN: [id number] Vital signs stable Patient appears comfortable Some moderate back soreness Motor strength in the legs appears to be doing better [facility]

## 2014-12-29 NOTE — Clinical Social Work Note (Signed)
Patient currently not medically stable for discharge. CSW will continue to follow patient and pt's family for continued support and to facilitate pt's discharge needs once medically stable.   Patient has bed at [facility].   Glendon Axe, MSW, LCSWA [phone] 12/29/2014 4:09 PM

## 2014-12-30 DIAGNOSIS — G9520 Unspecified cord compression: Secondary | ICD-10-CM

## 2014-12-30 DIAGNOSIS — I10 Essential (primary) hypertension: Secondary | ICD-10-CM

## 2014-12-30 LAB — BASIC METABOLIC PANEL
Anion gap: 9 (ref 5–15)
BUN: 23 mg/dL — ABNORMAL HIGH (ref 6–20)
CO2: 25 mmol/L (ref 22–32)
Calcium: 9.7 mg/dL (ref 8.9–10.3)
Chloride: 104 mmol/L (ref 101–111)
Creatinine, Ser: 1.05 mg/dL — ABNORMAL HIGH (ref 0.44–1.00)
GFR calc Af Amer: 55 mL/min — ABNORMAL LOW (ref >60)
GFR, EST NON AFRICAN AMERICAN: 48 mL/min — AB (ref >60)
Glucose, Bld: 91 mg/dL (ref 65–99)
Potassium: 4.1 mmol/L (ref 3.5–5.1)
Sodium: 138 mmol/L (ref 135–145)

## 2014-12-30 LAB — GLUCOSE, CAPILLARY
GLUCOSE-CAPILLARY: 108 mg/dL — AB (ref 65–99)
GLUCOSE-CAPILLARY: 97 mg/dL (ref 65–99)
GLUCOSE-CAPILLARY: 94 mg/dL (ref 65–99)
Glucose-Capillary: 92 mg/dL (ref 65–99)
Glucose-Capillary: 108 mg/dL — ABNORMAL HIGH (ref 65–99)

## 2014-12-30 LAB — CBC
HEMATOCRIT: 33.1 % — AB (ref 36.0–46.0)
Hemoglobin: 10.6 g/dL — ABNORMAL LOW (ref 12.0–15.0)
MCH: 29.5 pg (ref 26.0–34.0)
MCHC: 32 g/dL (ref 30.0–36.0)
MCV: 92.2 fL (ref 78.0–100.0)
PLATELETS: 515 10*3/uL — AB (ref 150–400)
RBC: 3.59 MIL/uL — ABNORMAL LOW (ref 3.87–5.11)
RDW: 14.7 % (ref 11.5–15.5)
WBC: 12.1 10*3/uL — ABNORMAL HIGH (ref 4.0–10.5)

## 2014-12-30 NOTE — Progress Notes (Signed)
Dressing removed per MD verbal order prior to discharge. Incision clean, dry, intact. No drainage at this time. Pt tolerating pain well. Chyan Carnero, Rande Brunt, RN

## 2014-12-30 NOTE — Progress Notes (Signed)
[facility] PROGRESS NOTE  Jodi Peters WJX:[id number] DOB: [birth year] DOA: 12/22/2014 PCP: Rosita Fire, MD  Assessment/Plan: 1 severe stenosis with myelopathy at T11-T12/severe stenosis L3-L4 secondary to spondylosis/cord compression Patient with central disc herniation and moderate spondylosis causing severe stenosis with myelopathy at T11-T12. Patient had presented with worsening gait with difficulty ambulating. Patient has been evaluated by neurosurgery who are recommending surgical decompression at T11-T12 and also at L3-L4. Patient status post bilateral laminotomies T11-T12 decompression of spinal canal, bilateral laminotomies L3-L4 decompression of lumbar spinal stenosis per Dr. Ellene Route 12/25/2014. Patient tolerated procedure well. PT/OT. Patient with some improvement with confusion after discontinuation of IV  dilaudid and oral Percocet. Follow. Minimize narcotics. Per neurosurgery. Decadron was discontinue.   2 hypertension Continue current dose of lisinopril 40 mg daily and Norvasc at 10 mg daily,  Lopressor 25 mg twice daily for better blood pressure control. Hydralazine when necessary.   3 probable UTI  Urinalysis done had moderate leukocytes negative nitrites positive RBCs, 3-6 WBCs. Urine cultures are pending. Continue with  IV Rocephin. Follow.  #4 glaucoma Continue eyedrops of Alphagan, Cosopt, xalatan.  #5 anxiety Stable. Continue Xanax.  #6 GERD Continue Pepcid.  #7 prophylaxis Pepcid for GI prophylaxis. Heparin for DVT prophylaxis.  Code Status: Full Family Communication: updated patient, no family at bedside.  Disposition Plan: Skilled nursing facility in 24 hours.    Consultants:  Nuerosurgery: Dr Ellene Route 12/23/14  Procedures:  MRI L spine 12/22/14 Bilateral laminotomies T11-T12 decompression of spinal canal, bilateral laminotomies L3-L4 decompression of lumbar spinal stenosis-per Dr. Ellene Route neurosurgery 12/25/2014  Antibiotics:  IV Rocephin  12/29/2014  HPI/Subjective: Feeling ok, just feel tired.    Objective: Filed Vitals:   12/30/14 0949  BP: 178/65  Pulse: 84  Temp: 98.8 F (37.1 C)  Resp: 16    Intake/Output Summary (Last 24 hours) at 12/30/14 1406 Last data filed at 12/30/14 0531  Gross per 24 hour  Intake      0 ml  Output   1400 ml  Net  -1400 ml   Filed Weights   12/22/14 1723  Weight: 63.957 kg (141 lb)    Exam:   General:  NAD  Cardiovascular: RRR  Respiratory: CTAB  Abdomen: Soft/NT/ND/+BS  Musculoskeletal: No c/c/e  Data Reviewed: Basic Metabolic Panel:  Recent Labs Lab 12/25/14 0403 12/26/14 0333 12/27/14 0359 12/28/14 0600 12/30/14 0540  NA 139 140 139 141 138  K 4.3 4.4 4.2 4.4 4.1  CL 110 108 107 108 104  CO2 23 25 23 25 25   GLUCOSE 115* 97 98 114* 91  BUN 25* 23* 23* 20 23*  CREATININE 0.96 1.02* 1.07* 0.95 1.05*  CALCIUM 10.0 9.9 9.5 9.8 9.7   Liver Function Tests: No results for input(s): AST, ALT, ALKPHOS, BILITOT, PROT, ALBUMIN in the last 168 hours. No results for input(s): LIPASE, AMYLASE in the last 168 hours. No results for input(s): AMMONIA in the last 168 hours. CBC:  Recent Labs Lab 12/25/14 0403 12/26/14 0333 12/27/14 0359 12/28/14 0600 12/30/14 0540  WBC 10.8* 11.6* 9.7 9.8 12.1*  NEUTROABS  --  8.7*  --   --   --   HGB 10.7* 11.5* 10.7* 10.3* 10.6*  HCT 33.6* 35.1* 33.1* 32.4* 33.1*  MCV 90.1 90.9 91.2 90.3 92.2  PLT 416* 431* 393 456* 515*   Cardiac Enzymes: No results for input(s): CKTOTAL, CKMB, CKMBINDEX, TROPONINI in the last 168 hours. BNP (last 3 results) No results for input(s): BNP in the last 8760 hours.  ProBNP (  last 3 results) No results for input(s): PROBNP in the last 8760 hours.  CBG:  Recent Labs Lab 12/29/14 1123 12/29/14 1614 12/29/14 2105 12/30/14 0701 12/30/14 1119  GLUCAP 114* 183* 82 92 108*  108*    Recent Results (from the past 240 hour(s))  Culture, Urine     Status: None (Preliminary result)    Collection Time: 12/29/14 10:45 AM  Result Value Ref Range Status   Specimen Description URINE, CLEAN CATCH  Final   Special Requests NONE  Final   Culture CULTURE REINCUBATED FOR BETTER GROWTH  Final   Report Status PENDING  Incomplete     Studies: No results found.  Scheduled Meds: . acetaminophen  1,000 mg Oral 3 times per day  . amLODipine  10 mg Oral Daily  . brimonidine  1 drop Both Eyes BID  . cefTRIAXone (ROCEPHIN)  IV  1 g Intravenous Q24H  . docusate sodium  100 mg Oral BID  . dorzolamide-timolol  1 drop Both Eyes BID  . famotidine  20 mg Oral BID  . gemfibrozil  600 mg Oral BID AC  . heparin  5,000 Units Subcutaneous 3 times per day  . insulin aspart  0-9 Units Subcutaneous TID WC  . latanoprost  1 drop Both Eyes QHS  . lisinopril  40 mg Oral Daily  . metoprolol tartrate  25 mg Oral BID  . pilocarpine  1 drop Both Eyes QHS  . senna  1 tablet Oral BID  . traMADol  50 mg Oral BID   Continuous Infusions:    Principal Problem:   Spinal cord compression Active Problems:   Anxiety state   Glaucoma   IBS   Spinal stenosis   Essential hypertension   Spinal stenosis of thoracic region   UTI (urinary tract infection)    Time spent: 35 mins    Niel Hummer A MD [facility] Pager [phone]. If 7PM-7AM, please contact night-coverage at [email or web address], password [facility] 12/30/2014, 2:06 PM  LOS: 8 days

## 2014-12-30 NOTE — Progress Notes (Signed)
Physical Therapy Treatment Patient Details Name: Jodi Peters MRN: [id number] DOB: July 05, 1929 Today's Date: 12/30/2014    History of Present Illness 81 y.o. female admitted with severe stenosis with myelopathy at T11-T12/severe stenosis L3-L4 secondary to spondylosis/cord compression. now s/p decompression 7/15.    PT Comments    Patient seen for mobility this session. Performed limited ambulation secondary to patient nauseated and reports significant fatigue limiting ability to continue ambulation. Patient was able to perform some self care and hygiene tasks during session with cues for sequencing at times. Will continue to see and progress as tolerated.   Follow Up Recommendations  SNF;Supervision/Assistance - 24 hour     Equipment Recommendations  None recommended by PT    Recommendations for Other Services       Precautions / Restrictions Precautions Precautions: Fall Restrictions Weight Bearing Restrictions: No    Mobility  Bed Mobility Overal bed mobility: Needs Assistance Bed Mobility: Rolling;Sidelying to Sit Rolling: Min assist Sidelying to sit: Min assist       General bed mobility comments: Min assist to rotate to EOB, Patient required increased time for all movements   Transfers Overall transfer level: Needs assistance Equipment used: Rolling walker (2 wheeled) Transfers: Sit to/from Stand Sit to Stand: Min assist         General transfer comment: Min assist to power up from bed and commode with assist for stbaility. patient reports increased generalized weakness during transfers. multiple cues for hand placement and safety with poor carry over this session.  Ambulation/Gait Ambulation/Gait assistance: Min assist Ambulation Distance (Feet): 18 Feet Assistive device: Rolling walker (2 wheeled) Gait Pattern/deviations: Step-to pattern;Decreased step length - right;Decreased step length - left;Decreased stride length;Decreased dorsiflexion -  right;Decreased dorsiflexion - left;Ataxic;Trunk flexed     General Gait Details: patient reprots increased LE fatigue and generalized weakness with mobility. Patient states I feel like my legs are just going to give out. patient also endorses nausea this session   Stairs            Wheelchair Mobility    Modified Rankin (Stroke Patients Only)       Balance   Sitting-balance support: No upper extremity supported Sitting balance-Leahy Scale: Fair     Standing balance support: Bilateral upper extremity supported Standing balance-Leahy Scale: Poor Standing balance comment: RW in standing, use of bilateral UE support on counter during self care tasks                    Cognition Arousal/Alertness: Awake/alert Behavior During Therapy: WFL for tasks assessed/performed Overall Cognitive Status: Impaired/Different from baseline Area of Impairment: Problem solving             Problem Solving: Slow processing;Difficulty sequencing General Comments: patient very slow to carry out tasks this session, cues for task completion during hygiene and self care in standing at sink.     Exercises General Exercises - Lower Extremity Ankle Circles/Pumps:  (AROM, BLEs Ankle pumps, LAQs, ABD/ADD sitting in chair)    General Comments        Pertinent Vitals/Pain Pain Assessment: Faces Faces Pain Scale: Hurts little more Pain Location: back Pain Descriptors / Indicators: Sore Pain Intervention(s): Repositioned;Monitored during session    Home Living                      Prior Function            PT Goals (current goals can now be found in the care  plan section) Acute Rehab PT Goals PT Goal Formulation: With patient Time For Goal Achievement: 01/06/15 Potential to Achieve Goals: Good Progress towards PT goals: Progressing toward goals (modestly)    Frequency  Min 3X/week    PT Plan Current plan remains appropriate    Co-evaluation              End of Session Equipment Utilized During Treatment: Gait belt Activity Tolerance: Patient limited by fatigue Patient left: in chair;with call bell/phone within reach;with chair alarm set;with family/visitor present     Time: 5681-2751 PT Time Calculation (min) (ACUTE ONLY): 21 min  Charges:  $Therapeutic Activity: 8-22 mins                    G CodesDuncan Dull 14-Jan-2015, 7:54 AM Alben Deeds, PT DPT  [phone]

## 2014-12-30 NOTE — Care Management Important Message (Signed)
Important Message  Patient Details  Name: Jodi Peters MRN: [id number] Date of Birth: [birth year]   Medicare Important Message Given:  Yes-second notification given    Delorse Lek 12/30/2014, 1:05 PM

## 2014-12-31 ENCOUNTER — Inpatient Hospital Stay
Admission: RE | Admit: 2014-12-31 | Discharge: 2015-01-03 | Disposition: A | Discharge status: Nursing Home (Includes ICF) | Payer: Medicare Other | Source: Ambulatory Visit | Attending: Internal Medicine | Admitting: Internal Medicine

## 2014-12-31 DIAGNOSIS — R531 Weakness: Secondary | ICD-10-CM | POA: Diagnosis not present

## 2014-12-31 DIAGNOSIS — F419 Anxiety disorder, unspecified: Secondary | ICD-10-CM | POA: Diagnosis not present

## 2014-12-31 DIAGNOSIS — L84 Corns and callosities: Secondary | ICD-10-CM | POA: Diagnosis not present

## 2014-12-31 DIAGNOSIS — G459 Transient cerebral ischemic attack, unspecified: Secondary | ICD-10-CM | POA: Diagnosis not present

## 2014-12-31 DIAGNOSIS — R471 Dysarthria and anarthria: Secondary | ICD-10-CM | POA: Diagnosis not present

## 2014-12-31 DIAGNOSIS — N289 Disorder of kidney and ureter, unspecified: Secondary | ICD-10-CM | POA: Diagnosis not present

## 2014-12-31 DIAGNOSIS — Z7982 Long term (current) use of aspirin: Secondary | ICD-10-CM | POA: Diagnosis not present

## 2014-12-31 DIAGNOSIS — H409 Unspecified glaucoma: Secondary | ICD-10-CM | POA: Diagnosis not present

## 2014-12-31 DIAGNOSIS — M4806 Spinal stenosis, lumbar region: Secondary | ICD-10-CM | POA: Diagnosis not present

## 2014-12-31 DIAGNOSIS — I679 Cerebrovascular disease, unspecified: Secondary | ICD-10-CM | POA: Diagnosis not present

## 2014-12-31 DIAGNOSIS — B351 Tinea unguium: Secondary | ICD-10-CM | POA: Diagnosis not present

## 2014-12-31 DIAGNOSIS — I639 Cerebral infarction, unspecified: Secondary | ICD-10-CM | POA: Diagnosis not present

## 2014-12-31 DIAGNOSIS — M1990 Unspecified osteoarthritis, unspecified site: Secondary | ICD-10-CM | POA: Diagnosis not present

## 2014-12-31 DIAGNOSIS — I071 Rheumatic tricuspid insufficiency: Secondary | ICD-10-CM | POA: Diagnosis not present

## 2014-12-31 DIAGNOSIS — R278 Other lack of coordination: Secondary | ICD-10-CM | POA: Diagnosis not present

## 2014-12-31 DIAGNOSIS — R339 Retention of urine, unspecified: Secondary | ICD-10-CM | POA: Diagnosis not present

## 2014-12-31 DIAGNOSIS — Z8719 Personal history of other diseases of the digestive system: Secondary | ICD-10-CM | POA: Diagnosis not present

## 2014-12-31 DIAGNOSIS — E559 Vitamin D deficiency, unspecified: Secondary | ICD-10-CM | POA: Diagnosis not present

## 2014-12-31 DIAGNOSIS — G9389 Other specified disorders of brain: Secondary | ICD-10-CM | POA: Diagnosis not present

## 2014-12-31 DIAGNOSIS — I63511 Cerebral infarction due to unspecified occlusion or stenosis of right middle cerebral artery: Secondary | ICD-10-CM | POA: Diagnosis not present

## 2014-12-31 DIAGNOSIS — K219 Gastro-esophageal reflux disease without esophagitis: Secondary | ICD-10-CM | POA: Diagnosis not present

## 2014-12-31 DIAGNOSIS — R001 Bradycardia, unspecified: Secondary | ICD-10-CM | POA: Diagnosis not present

## 2014-12-31 DIAGNOSIS — M4800 Spinal stenosis, site unspecified: Secondary | ICD-10-CM | POA: Diagnosis not present

## 2014-12-31 DIAGNOSIS — I2510 Atherosclerotic heart disease of native coronary artery without angina pectoris: Secondary | ICD-10-CM | POA: Diagnosis not present

## 2014-12-31 DIAGNOSIS — N10 Acute tubulo-interstitial nephritis: Secondary | ICD-10-CM | POA: Diagnosis not present

## 2014-12-31 DIAGNOSIS — Z8742 Personal history of other diseases of the female genital tract: Secondary | ICD-10-CM | POA: Diagnosis not present

## 2014-12-31 DIAGNOSIS — R569 Unspecified convulsions: Secondary | ICD-10-CM | POA: Diagnosis not present

## 2014-12-31 DIAGNOSIS — E785 Hyperlipidemia, unspecified: Secondary | ICD-10-CM | POA: Diagnosis not present

## 2014-12-31 DIAGNOSIS — Z853 Personal history of malignant neoplasm of breast: Secondary | ICD-10-CM | POA: Diagnosis not present

## 2014-12-31 DIAGNOSIS — R262 Difficulty in walking, not elsewhere classified: Secondary | ICD-10-CM | POA: Diagnosis not present

## 2014-12-31 DIAGNOSIS — I6522 Occlusion and stenosis of left carotid artery: Secondary | ICD-10-CM | POA: Diagnosis not present

## 2014-12-31 DIAGNOSIS — Z48811 Encounter for surgical aftercare following surgery on the nervous system: Secondary | ICD-10-CM | POA: Diagnosis not present

## 2014-12-31 DIAGNOSIS — G939 Disorder of brain, unspecified: Secondary | ICD-10-CM | POA: Diagnosis not present

## 2014-12-31 DIAGNOSIS — M25511 Pain in right shoulder: Secondary | ICD-10-CM | POA: Diagnosis not present

## 2014-12-31 DIAGNOSIS — R4182 Altered mental status, unspecified: Secondary | ICD-10-CM | POA: Diagnosis present

## 2014-12-31 DIAGNOSIS — Z880 Allergy status to penicillin: Secondary | ICD-10-CM | POA: Diagnosis not present

## 2014-12-31 DIAGNOSIS — R404 Transient alteration of awareness: Secondary | ICD-10-CM | POA: Diagnosis not present

## 2014-12-31 DIAGNOSIS — I340 Nonrheumatic mitral (valve) insufficiency: Secondary | ICD-10-CM | POA: Diagnosis not present

## 2014-12-31 DIAGNOSIS — I872 Venous insufficiency (chronic) (peripheral): Secondary | ICD-10-CM | POA: Diagnosis not present

## 2014-12-31 DIAGNOSIS — K5900 Constipation, unspecified: Secondary | ICD-10-CM | POA: Diagnosis not present

## 2014-12-31 DIAGNOSIS — K589 Irritable bowel syndrome without diarrhea: Secondary | ICD-10-CM | POA: Diagnosis not present

## 2014-12-31 DIAGNOSIS — R9401 Abnormal electroencephalogram [EEG]: Secondary | ICD-10-CM | POA: Diagnosis not present

## 2014-12-31 DIAGNOSIS — R29810 Facial weakness: Secondary | ICD-10-CM | POA: Diagnosis not present

## 2014-12-31 DIAGNOSIS — M4804 Spinal stenosis, thoracic region: Secondary | ICD-10-CM | POA: Diagnosis not present

## 2014-12-31 DIAGNOSIS — J301 Allergic rhinitis due to pollen: Secondary | ICD-10-CM | POA: Diagnosis not present

## 2014-12-31 DIAGNOSIS — Z872 Personal history of diseases of the skin and subcutaneous tissue: Secondary | ICD-10-CM | POA: Diagnosis not present

## 2014-12-31 DIAGNOSIS — Z79899 Other long term (current) drug therapy: Secondary | ICD-10-CM | POA: Diagnosis not present

## 2014-12-31 DIAGNOSIS — G9520 Unspecified cord compression: Secondary | ICD-10-CM | POA: Diagnosis not present

## 2014-12-31 DIAGNOSIS — I10 Essential (primary) hypertension: Secondary | ICD-10-CM | POA: Diagnosis not present

## 2014-12-31 DIAGNOSIS — M6281 Muscle weakness (generalized): Secondary | ICD-10-CM | POA: Diagnosis not present

## 2014-12-31 DIAGNOSIS — Z8709 Personal history of other diseases of the respiratory system: Secondary | ICD-10-CM | POA: Diagnosis not present

## 2014-12-31 DIAGNOSIS — Z7902 Long term (current) use of antithrombotics/antiplatelets: Secondary | ICD-10-CM | POA: Diagnosis not present

## 2014-12-31 LAB — BASIC METABOLIC PANEL
ANION GAP: 11 (ref 5–15)
BUN: 19 mg/dL (ref 6–20)
CALCIUM: 9.9 mg/dL (ref 8.9–10.3)
CHLORIDE: 104 mmol/L (ref 101–111)
CO2: 23 mmol/L (ref 22–32)
Creatinine, Ser: 0.93 mg/dL (ref 0.44–1.00)
GFR, EST NON AFRICAN AMERICAN: 55 mL/min — AB (ref >60)
GLUCOSE: 101 mg/dL — AB (ref 65–99)
POTASSIUM: 4 mmol/L (ref 3.5–5.1)
SODIUM: 138 mmol/L (ref 135–145)

## 2014-12-31 LAB — CBC
HCT: 33.7 % — ABNORMAL LOW (ref 36.0–46.0)
HEMOGLOBIN: 10.7 g/dL — AB (ref 12.0–15.0)
MCH: 29.2 pg (ref 26.0–34.0)
MCHC: 31.8 g/dL (ref 30.0–36.0)
MCV: 92.1 fL (ref 78.0–100.0)
Platelets: 538 10*3/uL — ABNORMAL HIGH (ref 150–400)
RBC: 3.66 MIL/uL — ABNORMAL LOW (ref 3.87–5.11)
RDW: 14.4 % (ref 11.5–15.5)
WBC: 10.6 10*3/uL — AB (ref 4.0–10.5)

## 2014-12-31 LAB — URINE CULTURE

## 2014-12-31 LAB — CLOSTRIDIUM DIFFICILE BY PCR: Toxigenic C. Difficile by PCR: NEGATIVE

## 2014-12-31 LAB — GLUCOSE, CAPILLARY: Glucose-Capillary: 125 mg/dL — ABNORMAL HIGH (ref 65–99)

## 2014-12-31 LAB — MAGNESIUM: Magnesium: 2.2 mg/dL (ref 1.7–2.4)

## 2014-12-31 MED ORDER — HYDRALAZINE HCL 25 MG PO TABS
25.0000 mg | ORAL_TABLET | Freq: Two times a day (BID) | ORAL | Status: DC
  Administered 2014-12-31: 25 mg via ORAL
  Filled 2014-12-31: qty 1

## 2014-12-31 MED ORDER — HYDRALAZINE HCL 25 MG PO TABS: 25.0000 mg | ORAL_TABLET | Freq: Two times a day (BID) | ORAL | Status: DC

## 2014-12-31 MED ORDER — CEPHALEXIN 500 MG PO CAPS
500.0000 mg | ORAL_CAPSULE | Freq: Three times a day (TID) | ORAL | Status: DC
  Administered 2014-12-31: 500 mg via ORAL
  Filled 2014-12-31: qty 1

## 2014-12-31 MED ORDER — CEPHALEXIN 500 MG PO CAPS: 500.0000 mg | ORAL_CAPSULE | Freq: Three times a day (TID) | ORAL | Status: DC

## 2014-12-31 MED ORDER — METOPROLOL TARTRATE 25 MG PO TABS: 25.0000 mg | ORAL_TABLET | Freq: Two times a day (BID) | ORAL | Status: DC

## 2014-12-31 NOTE — Progress Notes (Signed)
Patient left unit via [facility]AR with all belongings.

## 2014-12-31 NOTE — Clinical Social Work Placement (Signed)
   CLINICAL SOCIAL WORK PLACEMENT  NOTE  Date:  12/31/2014  Patient Details  Name: [name] MRN: [id number] Date of Birth: 13-Feb-1930  Clinical Social Work is seeking post-discharge placement for this patient at the [place] level of care (*CSW will initial, date and re-position this form in  chart as items are completed):  No   Patient/family provided with [place] Work Department's list of facilities offering this level of care within the geographic area requested by the patient (or if unable, by the patient's family).  Yes   Patient/family informed of their freedom to choose among providers that offer the needed level of care, that participate in Medicare, Medicaid or managed care program needed by the patient, have an available bed and are willing to accept the patient.  No   Patient/family informed of [facility]'s ownership interest in [facility] and [facility], as well as of the fact that they are under no obligation to receive care at these facilities.  PASRR submitted to EDS on 12/27/14     PASRR number received on 12/27/14     Existing PASRR number confirmed on       FL2 transmitted to all facilities in geographic area requested by pt/family on 12/27/14     FL2 transmitted to all facilities within larger geographic area on 12/27/14     Patient informed that his/her managed care company has contracts with or will negotiate with certain facilities, including the following:        Yes   Patient/family informed of bed offers received.  Patient chooses bed at  [facility] )     Physician recommends and patient chooses bed at      Patient to be transferred to  [facility] ) on 12/31/14.  Patient to be transferred to facility by  [facility] )     Patient family notified on 12/31/14 of transfer.  Name of family member notified:   (Pt's son, Shyann Hefner)     PHYSICIAN       Additional Comment:     _______________________________________________ Glendon Axe, MSW, [place] [phone] 12/31/2014 10:18 AM

## 2014-12-31 NOTE — Clinical Social Work Note (Signed)
Clinical Social Worker facilitated patient discharge including contacting patient family and facility to confirm patient discharge plans.  Clinical information faxed to facility and family agreeable with plan.  CSW arranged ambulance transport via [facility] to [facility].  RN to call report prior to discharge.  DC packet prepared and on chart for transport.   Clinical Social Worker will sign off for now as social work intervention is no longer needed. Please consult [place] again if new need arises.  Glendon Axe, MSW, LCSWA [phone] 12/31/2014 10:19 AM

## 2014-12-31 NOTE — Progress Notes (Signed)
Chaplain responded to referral from unit director. Chaplain introduced herself and her services to pt. Chaplain prayed with pt per pt request. Pt teary at times, but did not elaborate on her feelings. Pt appreciated chaplain visit. Chaplain will continue to follow.   12/31/14 1100  Clinical Encounter Type  Visited With Patient  Visit Type Initial;Spiritual support  Referral From Nurse  Spiritual Encounters  Spiritual Needs Emotional;Prayer  Stress Factors  Patient Stress Factors Health changes  Jabarie Pop, Barbette Hair, Chaplain 12/31/2014 11:29 AM

## 2014-12-31 NOTE — Progress Notes (Signed)
Patient is being d/c to a skilled nursing facility. Report called to the receiving nurse. Will continue to monitor.

## 2014-12-31 NOTE — Discharge Summary (Signed)
Physician Discharge Summary  Jodi Peters WCH:[id number] DOB: 1930-01-11 DOA: 12/22/2014  PCP: Rosita Fire, MD  Admit date: 12/22/2014 Discharge date: 12/31/2014  Time spent: 35 minutes  Recommendations for Outpatient Follow-up:  Needs to follow up with Dr Ellene Route post surgery.  Monitor BP, adjust medications as needed.   Discharge Diagnoses:    Spinal cord compression   Anxiety state   Glaucoma   IBS   Spinal stenosis   Essential hypertension   Spinal stenosis of thoracic region   UTI (urinary tract infection)   Discharge Condition: stable.   Diet recommendation: heart healthy  Filed Weights   12/22/14 1723  Weight: 63.957 kg (141 lb)    History of present illness:  Jodi Peters is a 81 y.o. female  Thigh pain. Radiation bilateral to calves. Muscle relaxer and tylenol w/ minimal improvement. Described as a cramping sensation. Started 2 weeks ago. Intermittent initially but progressive and now constant. Symptoms are worse when ambulating. Patient able to walk shorter and shorter distances over the past 2 weeks due to worsening symptoms. 3 days ago developed lower central back pain. Symptoms improve and patient is resting in bed. Denies any lower back trauma, loss of bowel or bladder function,.  Hospital Course:  1 severe stenosis with myelopathy at T11-T12/severe stenosis L3-L4 secondary to spondylosis/cord compression Patient with central disc herniation and moderate spondylosis causing severe stenosis with myelopathy at T11-T12. Patient had presented with worsening gait with difficulty ambulating. Patient has been evaluated by neurosurgery who are recommending surgical decompression at T11-T12 and also at L3-L4. Patient status post bilateral laminotomies T11-T12 decompression of spinal canal, bilateral laminotomies L3-L4 decompression of lumbar spinal stenosis per Dr. Ellene Route 12/25/2014. Patient tolerated procedure well. PT/OT. Patient with some improvement with confusion after  discontinuation of IV dilaudid and oral Percocet. Follow. Minimize narcotics. Per neurosurgery. Decadron was discontinue.   2 hypertension Continue current dose of lisinopril 40 mg daily and Norvasc at 10 mg daily, Lopressor 25 mg twice daily for better blood pressure control. Will start oral hydralazine 25 mg BID.   3 probable UTI Urinalysis done had moderate leukocytes negative nitrites positive RBCs, 3-6 WBCs. Urine culture growing proteus, sensitive to cephalosporin. Has recieved ceftriaxone for 2 days. She will be discharge on keflex for 5 days   #4 glaucoma Continue eyedrops of Alphagan, Cosopt, xalatan.  #5 anxiety Stable. Continue Xanax.  #6 GERD Continue Pepcid.  #7 prophylaxis Pepcid for GI prophylaxis. Heparin for DVT prophylaxis.  8-urine retention;  Will do voiding trial today. If unable to urinate will need to be discharge on floey and will need to be started on flomax.   9-diarrhea; resolved. Laxatives discontinue. c diff negative.   Procedures:  MRI L spine 12/22/14 Bilateral laminotomies T11-T12 decompression of spinal canal, bilateral laminotomies L3-L4 decompression of lumbar spinal stenosis-per Dr. Ellene Route neurosurgery 12/25/2014  Consultations:  Dr Ellene Route  Discharge Exam: Filed Vitals:   12/31/14 0543  BP: 178/57  Pulse:   Temp:   Resp:     General: Alert in no distress.  Cardiovascular: S 1, S 2 RRR Respiratory: CTA  Discharge Instructions   Discharge Instructions    Diet - low sodium heart healthy    Complete by:  As directed      Diet general    Complete by:  As directed      Discharge instructions    Complete by:  As directed   Follow up with Dr Ellene Route in 2-3 weeks.     Increase activity  slowly    Complete by:  As directed      Increase activity slowly    Complete by:  As directed           Current Discharge Medication List    START taking these medications   Details  cephALEXin (KEFLEX) 500 MG capsule Take 1 capsule (500  mg total) by mouth every 8 (eight) hours. Qty: 15 capsule, Refills: 0    hydrALAZINE (APRESOLINE) 25 MG tablet Take 1 tablet (25 mg total) by mouth 2 (two) times daily. Qty: 60 tablet, Refills: 0    HYDROcodone-acetaminophen (NORCO/VICODIN) 5-325 MG per tablet Take 1 tablet by mouth every 4 (four) hours as needed for moderate pain (mild pain). Qty: 20 tablet, Refills: 0    metoprolol tartrate (LOPRESSOR) 25 MG tablet Take 1 tablet (25 mg total) by mouth 2 (two) times daily. Qty: 30 tablet, Refills: 0      CONTINUE these medications which have CHANGED   Details  ALPRAZolam (XANAX) 0.25 MG tablet Take 0.5-1 tablets (0.125-0.25 mg total) by mouth 2 (two) times daily as needed for anxiety. Qty: 20 tablet, Refills: 0    amLODipine (NORVASC) 10 MG tablet Take 1 tablet (10 mg total) by mouth daily. Qty: 30 tablet, Refills: 0    lisinopril (PRINIVIL,ZESTRIL) 40 MG tablet Take 1 tablet (40 mg total) by mouth daily. Qty: 30 tablet, Refills: 0    traMADol (ULTRAM) 50 MG tablet Take 1 tablet (50 mg total) by mouth 2 (two) times daily. Qty: 60 tablet, Refills: 0      CONTINUE these medications which have NOT CHANGED   Details  acetaminophen (TYLENOL) 500 MG tablet Take 500 mg by mouth every 6 (six) hours as needed for mild pain or moderate pain.    aspirin EC 81 MG tablet Take 81 mg by mouth every morning.     brimonidine (ALPHAGAN) 0.15 % ophthalmic solution Place 1 drop into both eyes 2 (two) times daily.     cyclobenzaprine (FLEXERIL) 5 MG tablet Take 1 tablet (5 mg total) by mouth 2 (two) times daily as needed for muscle spasms. Qty: 15 tablet, Refills: 0    diphenhydrAMINE (BENADRYL) 25 mg capsule Take 25 mg by mouth at bedtime as needed for allergies.     dorzolamide-timolol (COSOPT) 22.3-6.8 MG/ML ophthalmic solution 1 drop 2 (two) times daily.      gemfibrozil (LOPID) 600 MG tablet Take 600 mg by mouth 2 (two) times daily before a meal.     latanoprost (XALATAN) 0.005 %  ophthalmic solution Place 1 drop into both eyes at bedtime.    Multiple Vitamin (MULTIVITAMIN) tablet Take 1 tablet by mouth daily.    pilocarpine (PILOCAR) 4 % ophthalmic solution Place 1 drop into both eyes at bedtime.    Polyethyl Glycol-Propyl Glycol (SYSTANE OP) Apply 1 drop to eye as needed (for dry eye relief).     Simethicone (GAS-X PO) Take 1 tablet by mouth daily as needed (for gas relief).     Vitamin D, Ergocalciferol, (DRISDOL) 50000 UNITS CAPS capsule Take 50,000 Units by mouth every 30 (thirty) days.     meclizine (ANTIVERT) 25 MG tablet Take 12.5 mg by mouth 3 (three) times daily as needed for dizziness.       STOP taking these medications     docusate sodium (COLACE) 100 MG capsule        Allergies  Allergen Reactions  . Penicillins Rash   Follow-up Information    Follow up with [facility]  J, MD. Schedule an appointment as soon as possible for a visit in 2 weeks.   Specialty:  Neurosurgery   Why:  f/u in 2-3 weeks.   Contact information:   [address] [place] [address] [phone]       Follow up with Rosita Fire, MD.   Specialty:  Internal Medicine   Contact information:   [place] [address] [phone]        The results of significant diagnostics from this hospitalization (including imaging, microbiology, ancillary and laboratory) are listed below for reference.    Significant Diagnostic Studies: Dg Lumbar Spine 2-3 Views  12/25/2014   CLINICAL DATA:  T11-12 and L3-4 laminectomy/microdiscectomy  EXAM: LUMBAR SPINE - 2-3 VIEW  COMPARISON:  None.  FINDINGS: Two intraoperative cross-table lateral radiographs of the lumbar spine are provided. There is degenerative disc disease at L3-4, L4-5 and L5-S1.  X-ray #1 demonstrates 2 metallic needles. The more superior needle tip projects over the inferior aspect of the T12 spinous process. The more inferior needle tip projects along the inferior aspect  of the left inferior articulating facet of L3.  X-ray #2 demonstrates posterior tissue retractors. There is a metallic instruments superiorly which projects just posterior to the inferior articulating facet of T11. There is a tissue slammed projecting over the inferior margin of the L3 spinous process.  IMPRESSION: Intraoperative localization radiographs as detailed above.   Electronically Signed   By: Kathreen Devoid   On: 12/25/2014 16:15   Mr Lumbar Spine Wo Contrast  12/22/2014   CLINICAL DATA:  Lumbago/low back pain. Cramping in both thighs since Wednesday. Acute onset of lumbago/low back pain 2 days ago. History of prior lumbar surgery and personal history of breast cancer.  EXAM: MRI LUMBAR SPINE WITHOUT CONTRAST  TECHNIQUE: Multiplanar, multisequence MR imaging of the lumbar spine was performed. No intravenous contrast was administered.  COMPARISON:  02/22/2010 MRI of the lumbar spine.  FINDINGS: Segmentation: Numbering used on prior exam preserved.  Alignment: Mild levoconvex curve of the lumbar spine with the apex at L2-L3.  3 mm of grade I anterolisthesis of L3 on L4 associated with collapse of the disc space and facet arthrosis.  Vertebrae: Ankylosis of L4-L5, presumably postsurgical and degenerative. Severe degenerative endplate changes are present, most pronounced at L3-L4. Discogenic endplate edema is present throughout the thoracolumbar spine. The endplate edema has progressed compared to the prior MRI from 2011.  Conus medullaris: Normal termination at T12.  Paraspinal tissues: LEFT retroperitoneal cyst is compatible with a simple renal cyst. This has probably enlarged compared to the prior exam from 2011.  Mild biliary ductal dilation is present, partially visible. This is most commonly associated with cholecystectomy.  Disc levels:  Disc Signal: Ossification of the L4-L5 disc. Diffuse disc desiccation. Disc degeneration has progressed most severely at L3-L4.  T11-T12: Severe disc degeneration.  There is severe central stenosis which has developed since the prior exam. Cord compression is present with low level cord edema just proximal to the conus medullaris.  T12-L1: Facet arthrosis is present. Minimal disc bulging and disc desiccation but no significant stenosis.  L1-L2: Chronic LEFT central disc extrusion is present with cranial migration of disc material. This produces mild central stenosis. Bilateral facet arthrosis is present. Neural foramina appear adequately patent.  L2-L3: Mild central stenosis associated with broad-based disc bulging, ligamentum flavum redundancy and facet arthrosis. Mild bilateral foraminal stenosis.  L3-L4: Severe central stenosis with complete effacement of the subarachnoid space  and bilateral subarticular stenosis. Although the degenerative disease was severe at the time of the prior exam, this has progressed. Moderate bilateral foraminal stenosis is also present. Progressive broad-based disc extrusion.  L4-L5: Ankylosis.  LEFT laminotomy mild recurrent central stenosis.  L5-S1: Disc desiccation and degeneration. Broad-based endplate osteophytes. Moderate bilateral foraminal stenosis. Large marginal osteophytes in the lateral region that are similar to the prior exam. When comparing to prior, disc degeneration has progressed L5-S1 stenosis appears similar.  IMPRESSION: 1. T11-T12 disc protrusion with ligamentum flavum redundancy producing severe central stenosis with cord compression and central cord edema just proximal to the conus medullaris. Expedited surgical consultation recommended. These results will be called to the ordering clinician or representative by the Radiologist Assistant, and communication documented in the PACS or zVision Dashboard. 2. Progressive L3-L4 severe central, subarticular and moderate bilateral foraminal stenosis. 3. L4-L5 ankylosis and LEFT laminotomy. 4. Mild progression of L5-S1 degenerative disease with similar degree of stenosis compared to  prior exam from 2011.   Electronically Signed   By: Dereck Ligas M.D.   On: 12/22/2014 15:37    Microbiology: Recent Results (from the past 240 hour(s))  Culture, Urine     Status: None   Collection Time: 12/29/14 10:45 AM  Result Value Ref Range Status   Specimen Description URINE, CLEAN CATCH  Final   Special Requests NONE  Final   Culture >=100,000 COLONIES/mL PROTEUS MIRABILIS  Final   Report Status 12/31/2014 FINAL  Final   Organism ID, Bacteria PROTEUS MIRABILIS  Final      Susceptibility   Proteus mirabilis - MIC*    AMPICILLIN <=2 SENSITIVE Sensitive     CEFAZOLIN <=4 SENSITIVE Sensitive     CEFTRIAXONE <=1 SENSITIVE Sensitive     CIPROFLOXACIN <=0.25 SENSITIVE Sensitive     GENTAMICIN <=1 SENSITIVE Sensitive     IMIPENEM 2 SENSITIVE Sensitive     NITROFURANTOIN 128 RESISTANT Resistant     TRIMETH/SULFA <=20 SENSITIVE Sensitive     AMPICILLIN/SULBACTAM <=2 SENSITIVE Sensitive     PIP/TAZO <=4 SENSITIVE Sensitive     * >=100,000 COLONIES/mL PROTEUS MIRABILIS  Clostridium Difficile by PCR (not at [facility])     Status: None   Collection Time: 12/31/14 12:15 AM  Result Value Ref Range Status   C difficile by pcr NEGATIVE NEGATIVE Final     Labs: Basic Metabolic Panel:  Recent Labs Lab 12/26/14 0333 12/27/14 0359 12/28/14 0600 12/30/14 0540 12/31/14 0410  NA 140 139 141 138 138  K 4.4 4.2 4.4 4.1 4.0  CL 108 107 108 104 104  CO2 25 23 25 25 23   GLUCOSE 97 98 114* 91 101*  BUN 23* 23* 20 23* 19  CREATININE 1.02* 1.07* 0.95 1.05* 0.93  CALCIUM 9.9 9.5 9.8 9.7 9.9  MG  --   --   --   --  2.2   Liver Function Tests: No results for input(s): AST, ALT, ALKPHOS, BILITOT, PROT, ALBUMIN in the last 168 hours. No results for input(s): LIPASE, AMYLASE in the last 168 hours. No results for input(s): AMMONIA in the last 168 hours. CBC:  Recent Labs Lab 12/26/14 0333 12/27/14 0359 12/28/14 0600 12/30/14 0540 12/31/14 0410  WBC 11.6* 9.7 9.8 12.1* 10.6*  NEUTROABS  8.7*  --   --   --   --   HGB 11.5* 10.7* 10.3* 10.6* 10.7*  HCT 35.1* 33.1* 32.4* 33.1* 33.7*  MCV 90.9 91.2 90.3 92.2 92.1  PLT 431* 393 456* 515* 538*  Cardiac Enzymes: No results for input(s): CKTOTAL, CKMB, CKMBINDEX, TROPONINI in the last 168 hours. BNP: BNP (last 3 results) No results for input(s): BNP in the last 8760 hours.  ProBNP (last 3 results) No results for input(s): PROBNP in the last 8760 hours.  CBG:  Recent Labs Lab 12/29/14 2105 12/30/14 0701 12/30/14 1119 12/30/14 1616 12/30/14 2151  GLUCAP 82 92 108*  108* 97 94       Signed:  Aries Kasa A  [facility] 12/31/2014, 9:39 AM

## 2015-01-01 ENCOUNTER — Encounter: Payer: Self-pay | Admitting: Internal Medicine

## 2015-01-01 ENCOUNTER — Non-Acute Institutional Stay (SKILLED_NURSING_FACILITY): Payer: Medicare Other | Admitting: Internal Medicine

## 2015-01-01 DIAGNOSIS — M4806 Spinal stenosis, lumbar region: Secondary | ICD-10-CM

## 2015-01-01 DIAGNOSIS — I10 Essential (primary) hypertension: Secondary | ICD-10-CM

## 2015-01-01 DIAGNOSIS — M4804 Spinal stenosis, thoracic region: Secondary | ICD-10-CM | POA: Diagnosis not present

## 2015-01-01 DIAGNOSIS — N10 Acute tubulo-interstitial nephritis: Secondary | ICD-10-CM | POA: Diagnosis not present

## 2015-01-01 DIAGNOSIS — M48061 Spinal stenosis, lumbar region without neurogenic claudication: Secondary | ICD-10-CM

## 2015-01-02 NOTE — Progress Notes (Signed)
Patient ID: Jodi Peters, female   DOB: 1929/12/04, 81 y.o.   MRN: [id number]   This is an acute visit.  Level care skilled.  Facility [facility].  Chief complaint-acute visit status post hospitalization status post bilateral laminotomies ---spinal canal decompression.  History of present illness.  Patient is an 81 year old female who had radiating side pain to her-Muscle Relaxer and Tylenol Did Not Help Much.  This Was Progressive.  She was found to have a central disc herniation and moderate spondylosis causing severe stenosis with myelopathy at T11-T12-she had surgical decompression at T11-T12 and also at L3 and L4-she had the bilateral laminotomies of T11 and T12 as well as L3 and L4.  Apparently she tolerated the procedure well-she was treated with IV Dilaudid and oral Percocet but had increased confusion and this was discontinued Decadron was discontinued as well-she is on Vicodin for pain now appears to be tolerating this with some relief.  She also had a urinalysis that was suspicious for UTI culture did grow out Proteus she is been discharged on Keflex for 5 additional days.  Her blood pressures apparently also were elevated she is on lisinopril as well as Norvasc and Lopressor.--Hydralazine was added before discharge  She did have diarrhea that resolved apparently C. difficile was negative her Laxton's were discontinued.  Currently she has no acute complaints she does complain somewhat back pain but says the Vicodin is helping vital signs are stable her blood pressure somewhat elevated at 146/71 this will have to be monitored as noted above.  His medical history.  Spinal cord compression with procedures as noted above.  Anxiety.  Glucoma.  Irritable bowel syndrome.  Spinal stenosis.  Hypertension.  UTI. Venous insufficiency.  Allergic rhinitis.  Seborrheic keratosis.  History of left breast mass.  Vertigo.  Carpal tunnel  syndrome.  Cataracts.  Osteoarthritis.  History of mitral regurgitation   Osteopenia.  Vitamin D deficiency.  Carotid artery or sclerosis.  History of rectocele.  History of cystocele.  Surgical history.  History of appendectomy.  Cholecystectomy.  Breast lumpectomy.  Left ear keloid.  Surgery for bleeding in left eye.  History of left breast cyst removal.  History of spurs on back.  Abdominal hysterectomy.  Social history no smoking history or shoulders tobacco history does not drink alcohol or use illicit drugs.  States she lives with family.  Family history.  History of mother with heart disease hypertension and diabetes.  Daughter with a history of left breast nodule.  Son with history of hypertension and hyperlipidemia.  Maternal grandmother positive for heart disease and hypertension.  Medications.  Keflex 500 mg 3 times a day for 5 days status post hospital discharge.  Hydralazine 25 mg twice a day.  Vicodin 10/13/2023 milligrams every 4 hours when necessary pain.  Lopressor 25 mg twice a day.  Xanax 0.125 mg 1 or 2 tabs twice a day when necessary anxiety.  Norvasc 10 mg daily.  Lisinopril 40 mg daily.  Tramadol 50 mg twice a day.  Tylenol 500 mg every 6 hours when necessary mild to moderate pain.  Enteric-coated aspirin 81 mg daily.  Alphagan eyedrops both eyes twice a day.  Flexeril 5 mg twice a day when necessary.  Benadryl 25 mg daily at bedtime when necessary.  Cosopt eyedrops 1 drop 2 times daily.  Lopid 600 mg twice a day.  Xalantan eyedrops 1 drop both eyes daily at bedtime.  Multivitamin daily.  Pilocarpine drops 1 drop both eyes daily at bedtime.  Systane eyedrops 1 drop each eye when necessary.  Simethicone when necessary.  Vitamin D 50,000 units every monthly.  Antivert 12.5 mg 3 times a day when necessary dizziness  Review of systems.  There are no complaints of fever or chills.  Skin does not  complain of rashes or itching surgical sites back appear to be unremarkable.  Head ears eyes nose mouth and throat-does not complain of any dysphagia or headaches mouth pain or sore throat.  Respiratory no complaints of shortness of breath or cough.  Cardiac does not complain of chest pain does not appear to have significant lower extremity edema.  GI does not complain of abdominal discomfort nausea vomiting diarrhea or constipation.  GU is being treated for UTI but does not complain of dysuria. With some pain but she says the Vicodin is helping.  Neurologic does not complain of dizziness headache syncopal episodes or numbness at this time.  Musculoskeletal again does have a history of recent back surgery  Psych does not complain of anxiety or overt depression currently \ \Physical exam.  Temperature is 98.7 pulse 94 respirations 20 blood pressure 146/71.  General is a pleasant frail elderly female in no distress currently resting comfortably in bed.  Her skin is warm and dry she does have keloid noted on left ear-she also has 2 longitudinal scar areas of her back these do not appear to have any sign of infection there is no drainage bleeding minimal tenderness to palpation no erythema area  Eyes pupils appear reactive light sclera and conjunctiva are clear visual acuity appears grossly intact.  Oropharynx is clear mucous membranes moist she does have numerous extractions.  Chest is clear to auscultation there is no labored breathing.  Heart is regular rate and rhythm with a 2/6 systolic murmur-pedal pulses are present she does not have significant lower extremity edema.  Her abdomen soft nontender positive bowel sounds.  Muscle skeletal is able to move all extremities 4 with lower extremity weakness I do not note any deformities surgical site on back as noted above grip strength appears to be intact bilaterally.  Neurologic is grossly intact no lateralizing findings touch  sensation appears to be intact lower extremities bilaterally.  Psych she appears alert and oriented pleasant and appropriate   Labs.  12/31/2014.  Sodium 138 potassium 4 BUN 19 creatinine 0.93.  WBC 10.6 hemoglobin 10.7 platelets 538  Liver function tests early July were within normal limits.  Assessment and plan.  #1-history of severe stenosis with myelopathy at T11-T12-severe stenosis L3-L4-again surgical procedure was performed with laminotomies--and decompression of the spinal canal-she did not do well with strong pain medication she is on Vicodin apparently this is helping this will have to be monitored with surgical follow-up also has tramadol as needed for milder pain.  #2 UTI she is finishing a course of Keflex appears to be relatively asymptomatic she's been afebrile at this point monitor.  Hypertension-apparently medication changes were made in hospital secondary to elevated readings we have minimal reading so far this will have to be monitored she is on lisinopril 40 mg a day Norvasc 10 mg a day Lopressor apparently was added 25 mg a day for better control as well as hydralazine 25 mg twice a day.  #4-history of glucoma she is on topical eyedrops including Alphagan Cosopt andxalatan  #5-anxiety she is on Xanax when necessary this appears to be stable currently.  . #6 history of vertigo she is on Antivert when necessary does not complain of  that currently.  #7-history of hyper lipidemia triglyceridemia-she is on Lopid-since her stay here will be I suspect fairly short will not be aggressive in pursuing lipid panel --will defer to primary care provider.  Of note Will update a CBC and metabolic panel first laboratory day next week    [id number]-of note greater than 40 minutes spent assessing patient-reviewing her chart-and coordinating and formulating a plan of care for numerous diagnoses-of note greater than 50% of time spent coordinating plan of care  -

## 2015-01-03 ENCOUNTER — Emergency Department (HOSPITAL_COMMUNITY): Payer: Medicare Other

## 2015-01-03 ENCOUNTER — Encounter (HOSPITAL_COMMUNITY): Payer: Self-pay

## 2015-01-03 ENCOUNTER — Non-Acute Institutional Stay (SKILLED_NURSING_FACILITY): Payer: Medicare Other | Admitting: Internal Medicine

## 2015-01-03 ENCOUNTER — Emergency Department (HOSPITAL_COMMUNITY)
Admission: EM | Admit: 2015-01-03 | Discharge: 2015-01-03 | Disposition: A | Discharge status: Home / Self Care | Payer: Medicare Other | Attending: Emergency Medicine | Admitting: Emergency Medicine

## 2015-01-03 ENCOUNTER — Inpatient Hospital Stay
Admission: RE | Admit: 2015-01-03 | Discharge: 2015-01-15 | Disposition: A | Discharge status: Nursing Home (Includes ICF) | Payer: Medicare Other | Source: Ambulatory Visit | Attending: Internal Medicine | Admitting: Internal Medicine

## 2015-01-03 DIAGNOSIS — Z8709 Personal history of other diseases of the respiratory system: Secondary | ICD-10-CM | POA: Diagnosis not present

## 2015-01-03 DIAGNOSIS — I10 Essential (primary) hypertension: Secondary | ICD-10-CM | POA: Insufficient documentation

## 2015-01-03 DIAGNOSIS — M4806 Spinal stenosis, lumbar region: Secondary | ICD-10-CM | POA: Diagnosis not present

## 2015-01-03 DIAGNOSIS — Z79899 Other long term (current) drug therapy: Secondary | ICD-10-CM | POA: Insufficient documentation

## 2015-01-03 DIAGNOSIS — R404 Transient alteration of awareness: Secondary | ICD-10-CM | POA: Insufficient documentation

## 2015-01-03 DIAGNOSIS — Z880 Allergy status to penicillin: Secondary | ICD-10-CM | POA: Diagnosis not present

## 2015-01-03 DIAGNOSIS — M48061 Spinal stenosis, lumbar region without neurogenic claudication: Secondary | ICD-10-CM

## 2015-01-03 DIAGNOSIS — Z8719 Personal history of other diseases of the digestive system: Secondary | ICD-10-CM | POA: Diagnosis not present

## 2015-01-03 DIAGNOSIS — Z8673 Personal history of transient ischemic attack (TIA), and cerebral infarction without residual deficits: Secondary | ICD-10-CM

## 2015-01-03 DIAGNOSIS — I63511 Cerebral infarction due to unspecified occlusion or stenosis of right middle cerebral artery: Secondary | ICD-10-CM | POA: Diagnosis not present

## 2015-01-03 DIAGNOSIS — Z872 Personal history of diseases of the skin and subcutaneous tissue: Secondary | ICD-10-CM | POA: Insufficient documentation

## 2015-01-03 DIAGNOSIS — G939 Disorder of brain, unspecified: Secondary | ICD-10-CM | POA: Diagnosis not present

## 2015-01-03 DIAGNOSIS — E785 Hyperlipidemia, unspecified: Secondary | ICD-10-CM | POA: Diagnosis not present

## 2015-01-03 DIAGNOSIS — Z853 Personal history of malignant neoplasm of breast: Secondary | ICD-10-CM | POA: Insufficient documentation

## 2015-01-03 DIAGNOSIS — G9389 Other specified disorders of brain: Secondary | ICD-10-CM | POA: Insufficient documentation

## 2015-01-03 DIAGNOSIS — Z7982 Long term (current) use of aspirin: Secondary | ICD-10-CM | POA: Insufficient documentation

## 2015-01-03 DIAGNOSIS — M899 Disorder of bone, unspecified: Principal | ICD-10-CM

## 2015-01-03 DIAGNOSIS — R471 Dysarthria and anarthria: Secondary | ICD-10-CM | POA: Insufficient documentation

## 2015-01-03 DIAGNOSIS — M4804 Spinal stenosis, thoracic region: Secondary | ICD-10-CM

## 2015-01-03 DIAGNOSIS — I639 Cerebral infarction, unspecified: Secondary | ICD-10-CM

## 2015-01-03 DIAGNOSIS — R900 Intracranial space-occupying lesion found on diagnostic imaging of central nervous system: Secondary | ICD-10-CM

## 2015-01-03 DIAGNOSIS — M1990 Unspecified osteoarthritis, unspecified site: Secondary | ICD-10-CM | POA: Insufficient documentation

## 2015-01-03 DIAGNOSIS — Z8742 Personal history of other diseases of the female genital tract: Secondary | ICD-10-CM | POA: Diagnosis not present

## 2015-01-03 DIAGNOSIS — R29810 Facial weakness: Secondary | ICD-10-CM | POA: Diagnosis not present

## 2015-01-03 DIAGNOSIS — E559 Vitamin D deficiency, unspecified: Secondary | ICD-10-CM | POA: Diagnosis not present

## 2015-01-03 LAB — URINALYSIS, ROUTINE W REFLEX MICROSCOPIC
BILIRUBIN URINE: NEGATIVE
Glucose, UA: NEGATIVE mg/dL
HGB URINE DIPSTICK: NEGATIVE
KETONES UR: NEGATIVE mg/dL
NITRITE: NEGATIVE
PH: 6 (ref 5.0–8.0)
SPECIFIC GRAVITY, URINE: 1.02 (ref 1.005–1.030)
Urobilinogen, UA: 0.2 mg/dL (ref 0.0–1.0)

## 2015-01-03 LAB — DIFFERENTIAL
BASOS PCT: 0 % (ref 0–1)
Basophils Absolute: 0 10*3/uL (ref 0.0–0.1)
EOS ABS: 0.1 10*3/uL (ref 0.0–0.7)
Eosinophils Relative: 1 % (ref 0–5)
Lymphocytes Relative: 15 % (ref 12–46)
Lymphs Abs: 1.9 10*3/uL (ref 0.7–4.0)
Monocytes Absolute: 1.3 10*3/uL — ABNORMAL HIGH (ref 0.1–1.0)
Monocytes Relative: 10 % (ref 3–12)
NEUTROS PCT: 74 % (ref 43–77)
Neutro Abs: 9.3 10*3/uL — ABNORMAL HIGH (ref 1.7–7.7)

## 2015-01-03 LAB — URINE MICROSCOPIC-ADD ON

## 2015-01-03 LAB — COMPREHENSIVE METABOLIC PANEL
ALBUMIN: 3.5 g/dL (ref 3.5–5.0)
ALT: 34 U/L (ref 14–54)
AST: 30 U/L (ref 15–41)
Alkaline Phosphatase: 94 U/L (ref 38–126)
Anion gap: 8 (ref 5–15)
BUN: 30 mg/dL — ABNORMAL HIGH (ref 6–20)
CHLORIDE: 106 mmol/L (ref 101–111)
CO2: 24 mmol/L (ref 22–32)
CREATININE: 1.3 mg/dL — AB (ref 0.44–1.00)
Calcium: 10.3 mg/dL (ref 8.9–10.3)
GFR calc Af Amer: 43 mL/min — ABNORMAL LOW (ref >60)
GFR, EST NON AFRICAN AMERICAN: 37 mL/min — AB (ref >60)
Glucose, Bld: 114 mg/dL — ABNORMAL HIGH (ref 65–99)
Potassium: 4 mmol/L (ref 3.5–5.1)
Sodium: 138 mmol/L (ref 135–145)
Total Bilirubin: 0.6 mg/dL (ref 0.3–1.2)
Total Protein: 7.4 g/dL (ref 6.5–8.1)

## 2015-01-03 LAB — CBC
HEMATOCRIT: 37.6 % (ref 36.0–46.0)
Hemoglobin: 11.9 g/dL — ABNORMAL LOW (ref 12.0–15.0)
MCH: 29.5 pg (ref 26.0–34.0)
MCHC: 31.6 g/dL (ref 30.0–36.0)
MCV: 93.1 fL (ref 78.0–100.0)
Platelets: 662 10*3/uL — ABNORMAL HIGH (ref 150–400)
RBC: 4.04 MIL/uL (ref 3.87–5.11)
RDW: 14.8 % (ref 11.5–15.5)
WBC: 12.6 10*3/uL — AB (ref 4.0–10.5)

## 2015-01-03 LAB — PROTIME-INR
INR: 1.03 (ref 0.00–1.49)
Prothrombin Time: 13.7 seconds (ref 11.6–15.2)

## 2015-01-03 LAB — TROPONIN I: Troponin I: 0.03 ng/mL (ref <0.031)

## 2015-01-03 LAB — APTT: APTT: 25 s (ref 24–37)

## 2015-01-03 NOTE — Progress Notes (Signed)
Patient ID: Jodi Peters, female   DOB: 06-Nov-1929, 81 y.o.   MRN: [id number]  Facility; [facility] Chief complaint; admission to SNF post admit to [facility] from 7/12 to 12/22/2014  History; this is a patient to presented to the hospital with the 5 pain and radiation to her calves. Apparently this started recently 2 weeks ago. She had progressive gait difficulties and central low back pain. She was discovered to have severe stenosis with myelopathy at T11-T12 and severe stenosis at L3-L4 secondary to spondylosis and cord compression. She underwent bilateral laminectomies at T11-T12, decompression of the spinal canal and bottle bilateral laminectomies at L3-L4 and decompression of lumbar spinal stenosis by Dr. Ellene Route of neurosurgery. There was some postoperative confusion/delirium which apparently cleared after discontinuing dilaudd Percocet etc.  I came to see her to admit mid her to this SNF where she is been sent for rehabilitation. I was somewhat startled to discover that she appeared to have left hemi-neglect weakness of the left arm and left leg. Per her nurse who cared for her yesterday the hemi-neglect may not have been exactly new she was able to walk holding on her arm yesterday. Left arm weakness was noted that. She certainly cannot walk today. I note from my 61 assistant on 7/22 did not show any of these concerns.I have reviewed a PT note from 7/20 again no description. She is not on any anticoagulation. There is no reference to any head trauma. She is not a diabetic and there is no seizure history that I can see.  Past Medical History  Diagnosis Date  . Hyperlipidemia   . Hypertension   . Mitral regurgitation     sinus bradycardia  . Breast pain     leftr  . Venous insufficiency   . Allergic rhinitis   . Seborrheic keratosis   . Breast mass     left  . Vertigo   . Carpal tunnel syndrome   . Cataract   . Glaucoma   . Constipation   . IBS (irritable bowel syndrome)   .  Osteopenia   . Osteoarthritis   . Low back pain   . Breast cancer   . Anxiety   . Vitamin D deficiency   . Bradycardia   . Carotid atherosclerosis   . Rectocele 06/30/2014  . Cystocele 06/30/2014    Past Surgical History  Procedure Laterality Date  . Appendectomy    . Cholecystectomy    . Breast lumpectomy    . Keloid      left ear  . Eye surgery      for bleed in left eye  . Cyst removed from left breast    . Spurs on back    . Back surgery    . Abdominal hysterectomy    . Lumbar laminectomy/decompression microdiscectomy N/A 12/25/2014    Procedure: Thoracic eleven-twelve Laminectomy/Diskectomy; Lumbar three-four Laminectomy/Diskectomy;  Surgeon: Kristeen Miss, MD;  Location: [facility];  Service: Neurosurgery;  Laterality: N/A;    Current Outpatient Prescriptions on File Prior to Visit  Medication Sig Dispense Refill  . acetaminophen (TYLENOL) 500 MG tablet Take 500 mg by mouth every 6 (six) hours as needed for mild pain or moderate pain.    Marland Kitchen ALPRAZolam (XANAX) 0.25 MG tablet Take 0.5-1 tablets (0.125-0.25 mg total) by mouth 2 (two) times daily as needed for anxiety. 20 tablet 0  . amLODipine (NORVASC) 10 MG tablet Take 1 tablet (10 mg total) by mouth daily. 30 tablet 0  .  aspirin EC 81 MG tablet Take 81 mg by mouth every morning.     . brimonidine (ALPHAGAN) 0.15 % ophthalmic solution Place 1 drop into both eyes 2 (two) times daily.     . cephALEXin (KEFLEX) 500 MG capsule Take 1 capsule (500 mg total) by mouth every 8 (eight) hours. 15 capsule 0  . cyclobenzaprine (FLEXERIL) 5 MG tablet Take 1 tablet (5 mg total) by mouth 2 (two) times daily as needed for muscle spasms. 15 tablet 0  . diphenhydrAMINE (BENADRYL) 25 mg capsule Take 25 mg by mouth at bedtime as needed for allergies.     Marland Kitchen dorzolamide-timolol (COSOPT) 22.3-6.8 MG/ML ophthalmic solution 1 drop 2 (two) times daily.      Marland Kitchen gemfibrozil (LOPID) 600 MG tablet Take 600 mg by mouth 2 (two) times daily before a meal.      . hydrALAZINE (APRESOLINE) 25 MG tablet Take 1 tablet (25 mg total) by mouth 2 (two) times daily. 60 tablet 0  . HYDROcodone-acetaminophen (NORCO/VICODIN) 5-325 MG per tablet Take 1 tablet by mouth every 4 (four) hours as needed for moderate pain (mild pain). 20 tablet 0  . latanoprost (XALATAN) 0.005 % ophthalmic solution Place 1 drop into both eyes at bedtime.    Marland Kitchen lisinopril (PRINIVIL,ZESTRIL) 40 MG tablet Take 1 tablet (40 mg total) by mouth daily. 30 tablet 0  . meclizine (ANTIVERT) 25 MG tablet Take 12.5 mg by mouth 3 (three) times daily as needed for dizziness.     . metoprolol tartrate (LOPRESSOR) 25 MG tablet Take 1 tablet (25 mg total) by mouth 2 (two) times daily. 30 tablet 0  . Multiple Vitamin (MULTIVITAMIN) tablet Take 1 tablet by mouth daily.    . pilocarpine (PILOCAR) 4 % ophthalmic solution Place 1 drop into both eyes at bedtime.    [name] Glycol-Propyl Glycol (SYSTANE OP) Apply 1 drop to eye as needed (for dry eye relief).     . Simethicone (GAS-X PO) Take 1 tablet by mouth daily as needed (for gas relief).     . traMADol (ULTRAM) 50 MG tablet Take 1 tablet (50 mg total) by mouth 2 (two) times daily. 60 tablet 0  . Vitamin D, Ergocalciferol, (DRISDOL) 50000 UNITS CAPS capsule Take 50,000 Units by mouth every 30 (thirty) days.       Social; the patient is able to to me she was living with her son her exact functional status is unclear. She was no longer driving.  reports that she has never smoked. She has never used smokeless tobacco. She reports that she does not drink alcohol or use illicit drugs.  family history includes Diabetes in her mother; Heart disease in her maternal grandmother and mother; Hyperlipidemia in her son; Hypertension in her maternal grandmother, mother, son, and son; Other in her daughter and son.   Review of systems HEENT; no swallowing difficulties no headache Respiratory no shortness of breath or cough Cardiac no chest pain or palpitations GI  no abdominal pain or diarrhea. Low back has; no major complaints here today. She has not been given any analgesia 6 she hasn't needed any per her nurse.  Physical examination Gen.; patient was sleeping in a wheelchair when I came in. The it took a bit to wake her up. She was lethargic speech was soft and difficult to understand. Over a period of 5-10 minutes she became somewhat more clear. Respiratory; clear air entry bilaterally Cardiac; heart sounds are normal no murmurs no carotid bruits. Abdomen no liver spleen  or tenderness. Lower back. Her surgical incisions look quite clear. Neurologic; cranial nerves. She has flattening of the left nasal labial fold. Tongue in the jaw deviate to the right.                    Motor; marked weakness of the left arm versus the right. Leg strength appears to be equal bilaterally.. She appears to have left hemi-neglect but no visual field problems.                    Reflexes; reflexes are completely symmetric and toes are downgoing bilaterally.  Impression/plan #1 status post lower thoracic and lumbar laminectomies for spinal stenosis. I can't really get a history of where this is right now per the patient to whether there is been improvement or not. #2 left sided weakness. There was some of this apparently present yesterday. Could not see any reference to this before that including reviewing notes from physical therapy in the hospital and the physician assistant who saw her here. I wonder whether she could've had a right hemisphere CVA. She was so lethargic and drowsy when I first came in the room for roughly 5 minutes I would also wonder about some form of postictal phenomenon. Some of this actually seems to have gotten better in the half an hour since I met her although she still has left arm weakness and some degree of left hemi-neglect. #3; hypertension we will monitor this no changes in medication #4 hyperlipidemia we'll monitor this there is no changes in  medication.  The left hemi-neglect would point to a right hemisphere problem. This appears to be making considerable improvement in a short period of time question TIA etc. I do not believe that this is happened within any short timeframe. She will need neuro imaging.  I am most concerned about the left sided weakness  Results for Jodi Peters, Jodi Peters (MRN [id number]) as of 01/03/2015 11:31  Ref. Range 12/26/2014 03:33 12/27/2014 03:59 12/28/2014 06:00 12/30/2014 05:40 12/31/2014 04:10  Sodium Latest Ref Range: 135-145 mmol/L 140 139 141 138 138  Potassium Latest Ref Range: 3.5-5.1 mmol/L 4.4 4.2 4.4 4.1 4.0  Chloride Latest Ref Range: 101-111 mmol/L 108 107 108 104 104  CO2 Latest Ref Range: 22-32 mmol/L 25 23 25 25 23   BUN Latest Ref Range: 6-20 mg/dL 23 (H) 23 (H) 20 23 (H) 19  Creatinine Latest Ref Range: 0.44-1.00 mg/dL 1.02 (H) 1.07 (H) 0.95 1.05 (H) 0.93  Calcium Latest Ref Range: 8.9-10.3 mg/dL 9.9 9.5 9.8 9.7 9.9  EGFR (Non-African Amer.) Latest Ref Range: >60 mL/min 49 (L) 47 (L) 54 (L) 48 (L) 55 (L)  EGFR (African American) Latest Ref Range: >60 mL/min 57 (L) 54 (L) >60 55 (L) >60  Glucose Latest Ref Range: 65-99 mg/dL 97 98 114 (H) 91 101 (H)  Anion gap Latest Ref Range: 5-15  7 9 8 9 11   Magnesium Latest Ref Range: 1.7-2.4 mg/dL     2.2  WBC Latest Ref Range: 4.0-10.5 K/uL 11.6 (H) 9.7 9.8 12.1 (H) 10.6 (H)  RBC Latest Ref Range: 3.87-5.11 MIL/uL 3.86 (L) 3.63 (L) 3.59 (L) 3.59 (L) 3.66 (L)  Hemoglobin Latest Ref Range: 12.0-15.0 g/dL 11.5 (L) 10.7 (L) 10.3 (L) 10.6 (L) 10.7 (L)  HCT Latest Ref Range: 36.0-46.0 % 35.1 (L) 33.1 (L) 32.4 (L) 33.1 (L) 33.7 (L)  MCV Latest Ref Range: 78.0-100.0 fL 90.9 91.2 90.3 92.2 92.1  MCH Latest Ref Range: 26.0-34.0 pg 29.8  29.5 28.7 29.5 29.2  MCHC Latest Ref Range: 30.0-36.0 g/dL 32.8 32.3 31.8 32.0 31.8  RDW Latest Ref Range: 11.5-15.5 % 13.9 13.8 14.1 14.7 14.4  Platelets Latest Ref Range: 150-400 K/uL 431 (H) 393 456 (H) 515 (H) 538 (H)  Neutrophils  Latest Ref Range: 43-77 % 75      Lymphocytes Latest Ref Range: 12-46 % 12      Monocytes Relative Latest Ref Range: 3-12 % 13 (H)      Eosinophil Latest Ref Range: 0-5 % 0      Basophil Latest Ref Range: 0-1 % 0      NEUT# Latest Ref Range: 1.7-7.7 K/uL 8.7 (H)      Lymphocyte # Latest Ref Range: 0.7-4.0 K/uL 1.4      Monocyte # Latest Ref Range: 0.1-1.0 K/uL 1.5 (H)      Eosinophils Absolute Latest Ref Range: 0.0-0.7 K/uL 0.0      Basophils Absolute Latest Ref Range: 0.0-0.1 K/uL 0.0      Smear Review Unknown LARGE PLATELETS P.Marland KitchenMarland Kitchen

## 2015-01-03 NOTE — Discharge Instructions (Signed)

## 2015-01-03 NOTE — ED Provider Notes (Signed)
CSN: [id number]     Arrival date & time 01/03/15  1241 History   First MD Initiated Contact with Patient 01/03/15 1250     Chief Complaint  Patient presents with  . Cerebrovascular Accident     (Consider location/radiation/quality/duration/timing/severity/associated sxs/prior Treatment) HPI  Jodi Peters is a 81 y.o. female who is here for evaluation of altered mental status and left-sided weakness. This process was discovered this morning when her doctor came to see her at the skilled nursing facility. The patient was asleep, and when she was aroused, she was confused, dysarthric and weak on the left side. During a short period of observation, she began to improve on all parameters. She was sent here for further evaluation. The patient had been admitted to the skilled nursing facility, 2 days ago for rehabilitation following thoracic and lumbar surgery. She was apparently walking, using a walker postoperatively at the hospital. The patient states that she was not walking yesterday because she has not seen a therapist yet. The patient remembers talking to the doctor this morning. She feels that she is taking "too much pain medicine". Her back pain has improved. She denies recent problems eating, fever, cough or chest pain. She states that she has never had a seizure. The patient is alert and communicative and answers all questions easily. The patient's son is with her the time of evaluation, and feels like her speech is "a little slurred". He does not appreciate any facial asymmetry. There are no other known modifying factors.    Past Medical History  Diagnosis Date  . Hyperlipidemia   . Hypertension   . Mitral regurgitation     sinus bradycardia  . Breast pain     leftr  . Venous insufficiency   . Allergic rhinitis   . Seborrheic keratosis   . Breast mass     left  . Vertigo   . Carpal tunnel syndrome   . Cataract   . Glaucoma   . Constipation   . IBS (irritable bowel syndrome)    . Osteopenia   . Osteoarthritis   . Low back pain   . Breast cancer   . Anxiety   . Vitamin D deficiency   . Bradycardia   . Carotid atherosclerosis   . Rectocele 06/30/2014  . Cystocele 06/30/2014   Past Surgical History  Procedure Laterality Date  . Appendectomy    . Cholecystectomy    . Keloid      left ear  . Eye surgery      for bleed in left eye  . Cyst removed from left breast    . Spurs on back    . Back surgery    . Abdominal hysterectomy    . Lumbar laminectomy/decompression microdiscectomy N/A 12/25/2014    Procedure: Thoracic eleven-twelve Laminectomy/Diskectomy; Lumbar three-four Laminectomy/Diskectomy;  Surgeon: Kristeen Miss, MD;  Location: [facility];  Service: Neurosurgery;  Laterality: N/A;  . Breast lumpectomy     Family History  Problem Relation Age of Onset  . Heart disease Mother   . Hypertension Mother   . Diabetes Mother   . Other Daughter     left breast nodule  . Hypertension Son   . Hyperlipidemia Son   . Heart disease Maternal Grandmother   . Hypertension Maternal Grandmother   . Hypertension Son   . Other Son     boils   History  Substance Use Topics  . Smoking status: Never Smoker   . Smokeless tobacco: Never Used  .  Alcohol Use: No   OB History    Gravida Para Term Preterm AB TAB SAB Ectopic Multiple Living   5 5        5      Review of Systems  All other systems reviewed and are negative.     Allergies  Penicillins  Home Medications   Prior to Admission medications   Medication Sig Start Date End Date Taking? Authorizing Provider  acetaminophen (TYLENOL) 500 MG tablet Take 500 mg by mouth every 6 (six) hours as needed for mild pain or moderate pain.   Yes Historical Provider, MD  ALPRAZolam (XANAX) 0.25 MG tablet Take 0.5-1 tablets (0.125-0.25 mg total) by mouth 2 (two) times daily as needed for anxiety. 12/28/14  Yes Eugenie Filler, MD  amLODipine (NORVASC) 10 MG tablet Take 1 tablet (10 mg total) by mouth daily.  12/28/14  Yes Eugenie Filler, MD  aspirin EC 81 MG tablet Take 81 mg by mouth every morning.    Yes Historical Provider, MD  brimonidine (ALPHAGAN) 0.15 % ophthalmic solution Place 1 drop into both eyes 2 (two) times daily.    Yes Historical Provider, MD  cephALEXin (KEFLEX) 500 MG capsule Take 1 capsule (500 mg total) by mouth every 8 (eight) hours. 12/31/14  Yes Belkys A Regalado, MD  cyclobenzaprine (FLEXERIL) 5 MG tablet Take 1 tablet (5 mg total) by mouth 2 (two) times daily as needed for muscle spasms. 12/16/14  Yes Nat Christen, MD  dorzolamide-timolol (COSOPT) 22.3-6.8 MG/ML ophthalmic solution 1 drop 2 (two) times daily.     Yes Historical Provider, MD  gemfibrozil (LOPID) 600 MG tablet Take 600 mg by mouth 2 (two) times daily before a meal.  02/25/13  Yes Historical Provider, MD  hydrALAZINE (APRESOLINE) 25 MG tablet Take 1 tablet (25 mg total) by mouth 2 (two) times daily. 12/31/14  Yes Belkys A Regalado, MD  latanoprost (XALATAN) 0.005 % ophthalmic solution Place 1 drop into both eyes at bedtime.   Yes Historical Provider, MD  lisinopril (PRINIVIL,ZESTRIL) 40 MG tablet Take 1 tablet (40 mg total) by mouth daily. 12/28/14  Yes Eugenie Filler, MD  meclizine (ANTIVERT) 25 MG tablet Take 12.5 mg by mouth 3 (three) times daily as needed for dizziness.    Yes Historical Provider, MD  metoprolol tartrate (LOPRESSOR) 25 MG tablet Take 1 tablet (25 mg total) by mouth 2 (two) times daily. 12/31/14  Yes Belkys A Regalado, MD  Multiple Vitamin (MULTIVITAMIN) tablet Take 1 tablet by mouth daily.   Yes Historical Provider, MD  pilocarpine (PILOCAR) 4 % ophthalmic solution Place 1 drop into both eyes at bedtime. 02/17/14  Yes Historical Provider, MD  Polyethyl Glycol-Propyl Glycol (SYSTANE OP) Apply 1 drop to eye as needed (for dry eye relief).    Yes Historical Provider, MD  Simethicone (GAS-X PO) Take 1 tablet by mouth daily as needed (for gas relief).    Yes Historical Provider, MD  traMADol (ULTRAM) 50 MG  tablet Take 1 tablet (50 mg total) by mouth 2 (two) times daily. 12/28/14  Yes Eugenie Filler, MD  Vitamin D, Ergocalciferol, (DRISDOL) 50000 UNITS CAPS capsule Take 50,000 Units by mouth every 30 (thirty) days.    Yes Historical Provider, MD  HYDROcodone-acetaminophen (NORCO/VICODIN) 5-325 MG per tablet Take 1 tablet by mouth every 4 (four) hours as needed for moderate pain (mild pain). Patient not taking: Reported on 01/03/2015 12/28/14   Eugenie Filler, MD   Temp(Src) 98.4 F (36.9 C) (Oral) Physical Exam  Constitutional: She is oriented to person, place, and time. She appears well-developed. No distress.  Elderly, frail  HENT:  Head: Normocephalic and atraumatic.  Right Ear: External ear normal.  Left Ear: External ear normal.  Eyes: Conjunctivae and EOM are normal. Pupils are equal, round, and reactive to light.  Neck: Normal range of motion and phonation normal. Neck supple.  Cardiovascular: Normal rate, regular rhythm and normal heart sounds.   Pulmonary/Chest: Effort normal and breath sounds normal. She exhibits no bony tenderness.  Abdominal: Soft. There is no tenderness.  Musculoskeletal: Normal range of motion.  Strength 5 over 5 in arms and legs bilaterally.  Neurological: She is alert and oriented to person, place, and time. No sensory deficit. She exhibits normal muscle tone. Coordination normal.  Possible mild dysarthria. No aphasia or nystagmus. Mild left nasolabial fold flattening. She is able to smile. She is able to hold both arms and legs off the stretcher independently for 5 seconds.  Skin: Skin is warm, dry and intact.  Psychiatric: She has a normal mood and affect. Her behavior is normal. Judgment and thought content normal.  Nursing note and vitals reviewed.   ED Course  Procedures (including critical care time)  13:10- consideration for thrombolytics- patient has improving mental status and left-sided weakness; therefore, her condition is improving, and she  has a very low NIH score. She is therefore not a candidate for thrombolysis.  Medications - No data to display  Patient Vitals for the past 24 hrs:  Temp Temp src  01/03/15 1249 98.4 F (36.9 C) Oral     17:30- . I discussed the case with Dr. Joneen Caraway, on-call for Dr. Dellia Nims. She agrees that the patient can be evaluated and treated from the rehabilitation facility setting. I have ordered an outpatient MRI, to be done tomorrow.  5:33 PM Reevaluation with update and discussion. After initial assessment and treatment, an updated evaluation reveals she continues to be alert and lucid with mild dysarthria, and left facial weakness. She has been able to tolerate oral nutrition.. [place] Review Labs Reviewed - No data to display  Imaging Review No results found.   EKG Interpretation   Date/Time:  Sunday January 03 2015 12:47:18 EDT Ventricular Rate:  71 PR Interval:  156 QRS Duration: 93 QT Interval:  393 QTC Calculation: 427 R Axis:   25 Text Interpretation:  Sinus rhythm since last tracing no significant  change Confirmed by Eulis Foster  MD, Usama Harkless ([id number]) on 01/03/2015 12:57:47 PM      MDM   Final diagnoses:  Transient alteration of awareness  Dysarthria  Facial weakness  Intracranial mass    Transient decreased level of responsiveness, with new onset left facial weakness and dysarthria. Symptoms improved. It is unlikely that this represents an acute CVA, or TIA. There is no evidence for acute metabolic process, serious bacterial infection or impending vascular collapse.  Nursing Notes Reviewed/ Care Coordinated Applicable Imaging Reviewed Interpretation of Laboratory Data incorporated into ED treatment  The patient appears reasonably screened and/or stabilized for discharge and I doubt any other medical condition or other [facility] requiring further screening, evaluation, or treatment in the ED at this time prior to discharge.  Plan: Home Medications- usual; Home  Treatments- rest; return here if the recommended treatment, does not improve the symptoms; Recommended follow up- PCP f/u tomorrow to check on the MR Brain results     Daleen Bo, MD 01/03/15 1735

## 2015-01-03 NOTE — ED Notes (Signed)
Attempted IV times 2 without success.

## 2015-01-03 NOTE — ED Notes (Signed)
Two failed attempts for venous access.  Dr Eulis Foster in room and aware.  Second nurse to try.

## 2015-01-03 NOTE — ED Notes (Signed)
Dr. Dellia Nims called and reported that pt had lumbar laminectomy a little over a week ago and was admitted to the [facility] on Friday.  Reports he examined pt for the first time himself this morning and says pt was initially hard to wake up.  After waking, he noticed pt had left arm weakness, slurred, speech, and left hemineglect.  Pt says doesn't feel any different than usual other than her mouth feels dry and is drooling.   Left sided facial droop noted.  Pt able to stand and transfer from wheel chair to bed and denies left sided weakness.  Dr. Dellia Nims also reported that the symptoms started to improve over the past 45 min.    Dr. Dellia Nims says staff nurse noticed some left arm weakness yesterday but says the PA's note from Friday did not mention any of these symptoms already existing.

## 2015-01-03 NOTE — ED Notes (Signed)
Called and gave report to [facility].  States they will come get pt.

## 2015-01-03 NOTE — ED Notes (Signed)
Neuro exam done and only left sided facial droop was the only finding with check.

## 2015-01-03 NOTE — ED Notes (Signed)
No change in neuro status

## 2015-01-04 ENCOUNTER — Inpatient Hospital Stay (HOSPITAL_COMMUNITY): Payer: Medicare Other | Attending: Internal Medicine

## 2015-01-04 ENCOUNTER — Ambulatory Visit (HOSPITAL_COMMUNITY): Payer: Medicare Other

## 2015-01-04 ENCOUNTER — Encounter (HOSPITAL_COMMUNITY)
Admission: RE | Admit: 2015-01-04 | Discharge: 2015-01-04 | Disposition: A | Discharge status: Home / Self Care | Payer: Medicare Other | Source: Skilled Nursing Facility | Attending: Internal Medicine | Admitting: Internal Medicine

## 2015-01-04 ENCOUNTER — Non-Acute Institutional Stay (SKILLED_NURSING_FACILITY): Payer: Medicare Other | Admitting: Internal Medicine

## 2015-01-04 DIAGNOSIS — G9389 Other specified disorders of brain: Secondary | ICD-10-CM

## 2015-01-04 DIAGNOSIS — G939 Disorder of brain, unspecified: Secondary | ICD-10-CM

## 2015-01-04 DIAGNOSIS — R339 Retention of urine, unspecified: Secondary | ICD-10-CM | POA: Diagnosis not present

## 2015-01-04 DIAGNOSIS — R569 Unspecified convulsions: Secondary | ICD-10-CM | POA: Diagnosis not present

## 2015-01-04 DIAGNOSIS — I639 Cerebral infarction, unspecified: Secondary | ICD-10-CM | POA: Diagnosis not present

## 2015-01-04 LAB — CBC WITH DIFFERENTIAL/PLATELET
BASOS PCT: 0 % (ref 0–1)
Basophils Absolute: 0 10*3/uL (ref 0.0–0.1)
EOS PCT: 2 % (ref 0–5)
Eosinophils Absolute: 0.2 10*3/uL (ref 0.0–0.7)
HEMATOCRIT: 32.7 % — AB (ref 36.0–46.0)
Hemoglobin: 10.6 g/dL — ABNORMAL LOW (ref 12.0–15.0)
Lymphocytes Relative: 26 % (ref 12–46)
Lymphs Abs: 2.7 10*3/uL (ref 0.7–4.0)
MCH: 30 pg (ref 26.0–34.0)
MCHC: 32.4 g/dL (ref 30.0–36.0)
MCV: 92.6 fL (ref 78.0–100.0)
MONOS PCT: 11 % (ref 3–12)
Monocytes Absolute: 1.2 10*3/uL — ABNORMAL HIGH (ref 0.1–1.0)
NEUTROS ABS: 6.5 10*3/uL (ref 1.7–7.7)
Neutrophils Relative %: 61 % (ref 43–77)
PLATELETS: 642 10*3/uL — AB (ref 150–400)
RBC: 3.53 MIL/uL — ABNORMAL LOW (ref 3.87–5.11)
RDW: 14.9 % (ref 11.5–15.5)
WBC: 10.6 10*3/uL — ABNORMAL HIGH (ref 4.0–10.5)

## 2015-01-04 LAB — BASIC METABOLIC PANEL
Anion gap: 9 (ref 5–15)
BUN: 29 mg/dL — ABNORMAL HIGH (ref 6–20)
CHLORIDE: 107 mmol/L (ref 101–111)
CO2: 23 mmol/L (ref 22–32)
Calcium: 10.1 mg/dL (ref 8.9–10.3)
Creatinine, Ser: 1.1 mg/dL — ABNORMAL HIGH (ref 0.44–1.00)
GFR calc Af Amer: 52 mL/min — ABNORMAL LOW (ref >60)
GFR calc non Af Amer: 45 mL/min — ABNORMAL LOW (ref >60)
Glucose, Bld: 103 mg/dL — ABNORMAL HIGH (ref 65–99)
Potassium: 3.8 mmol/L (ref 3.5–5.1)
Sodium: 139 mmol/L (ref 135–145)

## 2015-01-04 MED ORDER — GADOBENATE DIMEGLUMINE 529 MG/ML IV SOLN
7.0000 mL | Freq: Once | INTRAVENOUS | Status: AC | PRN
  Administered 2015-01-04: 7 mL via INTRAVENOUS

## 2015-01-04 NOTE — Progress Notes (Signed)
Patient ID: Jodi Peters, female   DOB: [birth year], 81 y.o.   MRN: [id number] Facility; [facility] History; this is a patient I am seeing in follow-up from a complicated day yesterday which started with my initial visitation to admit the patient to skilled nursing. She had previously been at [facility] presenting with bilateral thigh pain lower extremity weakness. This started 2 weeks before the admission but became progressive to the point where the patient was not able to ambulate to. She was discovered by MRI to have myelopathy at T11-T12 and also lumbar spinal stenosis at L3-L4. She underwent bilateral laminotomies at T11-T12, decompression of lumbar spinal stenosis operated by Dr. Ellene Route on 7/15. She had postoperative delirium which was felt to be medication induced.  When I came to see her yesterday she was sitting in a wheelchair asleep. I woke her up in order to start doing her evaluation. It took roughly 5-10 minutes for the patient to become are reasonably alert. During the course of this I noted left hemi-neglect extreme left arm weakness [not antigravity]. Her speech was soft, barely audible. She could not ambulate. The nurse reported that some of this had been present the day before although there was no description of anything like this during a visit from my physician assistant on 7/22 nor a physical therapy assessment in the hospital from 7/20. 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 When she had left ear I had already noted improvement in the left arm weakness and her left hemi-neglect. In she spent several more hours in the emergency room with her CT scan listed below. It was noted that she went on to have further recovery. There is no history of headache, diplopia, altered sensation. No prior seizure history.        CLINICAL DATA:  81 year old hypertensive female with hyperlipidemia 1 week post laminectomy. Recent left arm weakness slurred speech  and left-sided neglect. Initial encounter.   EXAM: CT HEAD WITHOUT CONTRAST   TECHNIQUE: Contiguous axial images were obtained from the base of the skull through the vertex without intravenous contrast.   COMPARISON:  09/30/2009.   FINDINGS: Rounded 2 cm hypodensity right frontal subcortical white matter new from 2011. This does not have the typical appearance of an infarct. Underlying mass therefore cannot be excluded and MR of the brain with and without contrast recommended for further delineation.   No intracranial hemorrhage.   No hydrocephalus.   Markedly enlarged left ear unchanged from prior exam.   Vascular calcifications.   IMPRESSION: Rounded 2 cm hypodensity right frontal subcortical white matter new from 2011. This does not have the typical appearance of an infarct. Underlying mass therefore cannot be excluded and MR of the brain with and without contrast recommended for further delineation.     Electronically Signed   By: Genia Del M.D.   On: 01/03/2015 15:56        Past Medical History  Diagnosis Date  . Hyperlipidemia   . Hypertension   . Mitral regurgitation     sinus bradycardia  . Breast pain     leftr  . Venous insufficiency   . Allergic rhinitis   . Seborrheic keratosis   . Breast mass     left  . Vertigo   . Carpal tunnel syndrome   . Cataract   . Glaucoma   . Constipation   . IBS (irritable bowel syndrome)   . Osteopenia   . Osteoarthritis   .  Low back pain   . Breast cancer   . Anxiety   . Vitamin D deficiency   . Bradycardia   . Carotid atherosclerosis   . Rectocele 06/30/2014  . Cystocele 06/30/2014    Current medications Keflex 500 mg every 8 for 5 days for a Proteus UTI Hydralazine 25 twice a day Norco 5/325 every 4 when necessary Lopressor 25 twice a day Xanax 0.5 twice a day when necessary anxiety Norvasc 10 daily Lisinopril 40 daily Tramadol 50 mg twice a day Tylenol 500 every 6 hours when  necessary Enteric-coated aspirin 81 daily Alphagan ophthalmic  into both eyes twice a day Flexeril 5 mg twice a day when necessary Benadryl 25 at bedtime when necessary allergies Cosopt ophthalmic 1 drop twice a day Lopid 600 twice a day Xalatan at .005% ophthalmic 1 drop in the both eyes at bedtime Pilocar 4% 1 drop into both eyes at bedtime Meclizine 12.53 times a day when necessary  Review of systems HEENT no headache or diplopia Cardiac no chest pain or palpitations GI no nausea or vomiting GU no voiding difficulties brackets note urinary retention in the hospital" Neurologic; the patient remembers my visit yesterday and her trip to the ER.  Physical examination Gen. the patient looks a lot better than when I saw her yesterday. Respiratory clear entry bilaterally Cardiac to it is 6 short benign midsystolic ejection murmur that does not radiate at the lower left sternal border there is no carotid bruits Abdomen; no liver no spleen no palpable masses. GU bladder is not enlarged obviously at the bedside Neurologic; cranial nerves extraocular movements are normal visual fields are normal the flattening of her left leg is on nasal labial fold is much better although there may be some slight asymmetry. There is no pronator drift her left arm strength has returned. I did not attempt to ambulate her. The patient's speech is much more comprehensible.  Impression/plan #1 mass in the right frontal cortical white matter. She will need an MRI. If this is not been arranged from the ER then I'll go ahead and arrange this. #2 the significant deficits this patient had which resolved over the course of hours has a limited differential in my mind. I think this would come down to either a postictal phenomenon brackets possibly at this point associated with the mass] and/or a TIA. I'm going to wait the results of the MRI before I her to order further tests. However I think an EEG is in order in either  case. I suspect she will need a neurology consult as well. #3 urinary retention in the hospital. Will order a post void bladder scan. We do have a bladder scan available in the facility. #4 Proteus UTI; she is completing Keflex for this.

## 2015-01-05 ENCOUNTER — Non-Acute Institutional Stay (SKILLED_NURSING_FACILITY): Payer: Medicare Other | Admitting: Internal Medicine

## 2015-01-05 ENCOUNTER — Other Ambulatory Visit (HOSPITAL_COMMUNITY): Payer: Self-pay | Admitting: Respiratory Therapy

## 2015-01-05 DIAGNOSIS — R569 Unspecified convulsions: Secondary | ICD-10-CM

## 2015-01-05 DIAGNOSIS — I679 Cerebrovascular disease, unspecified: Secondary | ICD-10-CM

## 2015-01-05 NOTE — Progress Notes (Signed)
Patient ID: Jodi Peters, female   DOB: 04/26/1930, 81 y.o.   MRN: [id number]   This is an acute visit.  Level care skilled.  Facility [facility].  Chief complaint acute visit follow-up question CVA   HPI-; patient is an 81 year old female who had .  previously been at [facility] presenting with bilateral thigh pain lower extremity weakness. This started 2 weeks before the admission but became progressive to the point where the patient was not able to ambulate to. She was discovered by MRI to have myelopathy at T11-T12 and also lumbar spinal stenosis at L3-L4. She underwent bilateral laminotomies at T11-T12, decompression of lumbar spinal stenosis operated by Dr. Ellene Route on 7/15. She had postoperative delirium which was felt to be medication induced.  Dr. Dellia Nims saw her earlier this week he noted left hemi-neglect extreme left arm weakness [not antigravity]. Her speech was soft, barely audible. She could not ambulate.   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 When she had left Dr. Dellia Nims already noted improvement in the left arm weakness and her left hemi-neglect. In she spent several more hours in the emergency room with her CT scan listed below. It was noted that she went on to have further recovery. There is no history of headache, diplopia, altered sensation. No prior seizure history   Since then and MRI follow-up has been ordered which showed a 2.7 cm acute/subacute infarction in the right posterior frontal white matter with minor overlying cortical involvement no hemorrhage no acute strokes elsewhere within the brain.  Also showed chronic small vessel ischemic changes elsewhere affecting the cerebral hemispheric white matter.  Clinically she appears to be at baseline again moving all extremities with baseline strength-- nursing has not reported any changes.  Marland Kitchen        CLINICAL DATA:  81 year old hypertensive female with hyperlipidemia 1 week  post laminectomy. Recent left arm weakness slurred speech and left-sided neglect. Initial encounter.   EXAM: CT HEAD WITHOUT CONTRAST   TECHNIQUE: Contiguous axial images were obtained from the base of the skull through the vertex without intravenous contrast.   COMPARISON:  09/30/2009.   FINDINGS: Rounded 2 cm hypodensity right frontal subcortical white matter new from 2011. This does not have the typical appearance of an infarct. Underlying mass therefore cannot be excluded and MR of the brain with and without contrast recommended for further delineation.   No intracranial hemorrhage.   No hydrocephalus.   Markedly enlarged left ear unchanged from prior exam.   Vascular calcifications.   IMPRESSION: Rounded 2 cm hypodensity right frontal subcortical white matter new from 2011. This does not have the typical appearance of an infarct. Underlying mass therefore cannot be excluded and MR of the brain with and without contrast recommended for further delineation.     Electronically Signed   By: Genia Del M.D.   On: 01/03/2015 15:56        Past Medical History  Diagnosis Date  . Hyperlipidemia   . Hypertension   . Mitral regurgitation     sinus bradycardia  . Breast pain     leftr  . Venous insufficiency   . Allergic rhinitis   . Seborrheic keratosis   . Breast mass     left  . Vertigo   . Carpal tunnel syndrome   . Cataract   . Glaucoma   . Constipation   . IBS (irritable bowel syndrome)   . Osteopenia   . Osteoarthritis   .  Low back pain   . Breast cancer   . Anxiety   . Vitamin D deficiency   . Bradycardia   . Carotid atherosclerosis   . Rectocele 06/30/2014  . Cystocele 06/30/2014    Current medications Keflex 500 mg every 8 for 5 days for a Proteus UTI Hydralazine 25 twice a day Norco 5/325 every 4 when necessary Lopressor 25 twice a day Xanax 0.5 twice a day when necessary anxiety Norvasc 10 daily Lisinopril 40 daily Tramadol 50 mg  twice a day Tylenol 500 every 6 hours when necessary Enteric-coated aspirin 81 daily Alphagan ophthalmic  into both eyes twice a day Flexeril 5 mg twice a day when necessary Benadryl 25 at bedtime when necessary allergies Cosopt ophthalmic 1 drop twice a day Lopid 600 twice a day Xalatan at .005% ophthalmic 1 drop in the both eyes at bedtime Pilocar 4% 1 drop into both eyes at bedtime Meclizine 12.53 times a day when necessary  Review of systems General does not have any complaints no fever or chills HEENT no headache or diplopia Cardiac no chest pain or palpitations GI no nausea or vomiting GU no voiding difficulties brackets note urinary retention in the hospital"bladder scan here has been unremarkable-- Neurologic; does not complain of headache or dizziness appears to be at baseline.  Physical examination Temperature 98.1 pulse 78 respirations 20 blood pressure 148/58 Gen. Doesn't frail elderly female in no distress appears comfortable.  Her skin is warm and dry. Respiratory clear entry bilaterally Cardiac Regular rate and rhythm with a 2/6 systolic murmur Abdomen; soft nontender positive bowel sounds GU bladder is not enlarged obviously at the bedside Neurologic; cranial nerves extraocular movements are normal visual fields are normal Her speech is clear-she is able to move all her extremities with baseline strength here I could not really appreciate lateralizing findings .  Labs.  01/04/2015.  Sodium 139 potassium 3.8 BUN 29 creatinine 1.2.  WBC 10.6 hemoglobin 10.6 platelets 642.    Impression/plan #1 mass in the right frontal cortical white matter--cerebrovascular disease--MRI follow-up with results noted above-this was discussed with Dr. Dellia Nims via phone-at this point we will discontinue the aspirin and start Plavix 75 mg daily.  Also will order a cardiac echo as well as carotid Doppler studies.  A neurology consult also is pending.  Clinically she appears  to be stable and back at her baseline at this point continue to monitor   [id number].   Marland Kitchen

## 2015-01-08 ENCOUNTER — Ambulatory Visit (HOSPITAL_COMMUNITY): Payer: Medicare Other | Attending: Internal Medicine

## 2015-01-08 ENCOUNTER — Ambulatory Visit (HOSPITAL_COMMUNITY): Payer: Medicare Other

## 2015-01-08 DIAGNOSIS — I639 Cerebral infarction, unspecified: Secondary | ICD-10-CM

## 2015-01-08 DIAGNOSIS — I340 Nonrheumatic mitral (valve) insufficiency: Secondary | ICD-10-CM | POA: Diagnosis not present

## 2015-01-10 ENCOUNTER — Encounter: Payer: Self-pay | Admitting: Internal Medicine

## 2015-01-12 ENCOUNTER — Ambulatory Visit (HOSPITAL_COMMUNITY): Payer: Medicare Other

## 2015-01-12 ENCOUNTER — Encounter (HOSPITAL_COMMUNITY)
Admission: AD | Admit: 2015-01-12 | Discharge: 2015-01-12 | Disposition: A | Discharge status: Home / Self Care | Payer: Medicare Other | Source: Skilled Nursing Facility | Attending: Internal Medicine | Admitting: Internal Medicine

## 2015-01-12 ENCOUNTER — Ambulatory Visit (HOSPITAL_COMMUNITY)
Admit: 2015-01-12 | Discharge: 2015-01-12 | Disposition: A | Discharge status: Home / Self Care | Payer: Medicare Other | Source: Ambulatory Visit | Attending: Neurology | Admitting: Neurology

## 2015-01-12 DIAGNOSIS — R9401 Abnormal electroencephalogram [EEG]: Secondary | ICD-10-CM | POA: Insufficient documentation

## 2015-01-12 DIAGNOSIS — R569 Unspecified convulsions: Secondary | ICD-10-CM | POA: Insufficient documentation

## 2015-01-12 DIAGNOSIS — Z79899 Other long term (current) drug therapy: Secondary | ICD-10-CM | POA: Diagnosis not present

## 2015-01-12 DIAGNOSIS — Z7982 Long term (current) use of aspirin: Secondary | ICD-10-CM | POA: Diagnosis not present

## 2015-01-12 LAB — CBC WITH DIFFERENTIAL/PLATELET
BASOS ABS: 0.1 10*3/uL (ref 0.0–0.1)
BASOS PCT: 1 % (ref 0–1)
EOS ABS: 0.3 10*3/uL (ref 0.0–0.7)
EOS PCT: 4 % (ref 0–5)
HEMATOCRIT: 30.9 % — AB (ref 36.0–46.0)
Hemoglobin: 9.7 g/dL — ABNORMAL LOW (ref 12.0–15.0)
Lymphocytes Relative: 29 % (ref 12–46)
Lymphs Abs: 2.1 10*3/uL (ref 0.7–4.0)
MCH: 29.4 pg (ref 26.0–34.0)
MCHC: 31.4 g/dL (ref 30.0–36.0)
MCV: 93.6 fL (ref 78.0–100.0)
MONO ABS: 0.8 10*3/uL (ref 0.1–1.0)
MONOS PCT: 11 % (ref 3–12)
Neutro Abs: 4 10*3/uL (ref 1.7–7.7)
Neutrophils Relative %: 55 % (ref 43–77)
PLATELETS: 580 10*3/uL — AB (ref 150–400)
RBC: 3.3 MIL/uL — ABNORMAL LOW (ref 3.87–5.11)
RDW: 14.8 % (ref 11.5–15.5)
WBC: 7.2 10*3/uL (ref 4.0–10.5)

## 2015-01-12 LAB — COMPREHENSIVE METABOLIC PANEL
ALK PHOS: 86 U/L (ref 38–126)
ALT: 11 U/L — ABNORMAL LOW (ref 14–54)
AST: 18 U/L (ref 15–41)
Albumin: 3.3 g/dL — ABNORMAL LOW (ref 3.5–5.0)
Anion gap: 7 (ref 5–15)
BUN: 19 mg/dL (ref 6–20)
CALCIUM: 10.4 mg/dL — AB (ref 8.9–10.3)
CO2: 27 mmol/L (ref 22–32)
Chloride: 108 mmol/L (ref 101–111)
Creatinine, Ser: 0.96 mg/dL (ref 0.44–1.00)
GFR calc Af Amer: >60 mL/min (ref >60)
GFR, EST NON AFRICAN AMERICAN: 53 mL/min — AB (ref >60)
Glucose, Bld: 97 mg/dL (ref 65–99)
Potassium: 4.1 mmol/L (ref 3.5–5.1)
Sodium: 142 mmol/L (ref 135–145)
Total Bilirubin: 0.4 mg/dL (ref 0.3–1.2)
Total Protein: 6.4 g/dL — ABNORMAL LOW (ref 6.5–8.1)

## 2015-01-12 NOTE — Progress Notes (Signed)
EEG Completed; Results Pending  

## 2015-01-14 NOTE — Procedures (Signed)
Fidelity A. Merlene Laughter, MD     [email or web address]           HISTORY: The patient is a 81 year old female who presents with altered mental status with left-sided weakness. The study is being done to evaluate for possible seizures.  MEDICATIONS: Scheduled Meds: Continuous Infusions: PRN Meds:.  Prior to Admission medications   Medication Sig Start Date End Date Taking? Authorizing Provider  acetaminophen (TYLENOL) 500 MG tablet Take 500 mg by mouth every 6 (six) hours as needed for mild pain or moderate pain.    Historical Provider, MD  ALPRAZolam Duanne Moron) 0.25 MG tablet Take 0.5-1 tablets (0.125-0.25 mg total) by mouth 2 (two) times daily as needed for anxiety. 12/28/14   Eugenie Filler, MD  amLODipine (NORVASC) 10 MG tablet Take 1 tablet (10 mg total) by mouth daily. 12/28/14   Eugenie Filler, MD  aspirin EC 81 MG tablet Take 81 mg by mouth every morning.     Historical Provider, MD  brimonidine (ALPHAGAN) 0.15 % ophthalmic solution Place 1 drop into both eyes 2 (two) times daily.     Historical Provider, MD  cephALEXin (KEFLEX) 500 MG capsule Take 1 capsule (500 mg total) by mouth every 8 (eight) hours. 12/31/14   Belkys A Regalado, MD  cyclobenzaprine (FLEXERIL) 5 MG tablet Take 1 tablet (5 mg total) by mouth 2 (two) times daily as needed for muscle spasms. 12/16/14   Nat Christen, MD  dorzolamide-timolol (COSOPT) 22.3-6.8 MG/ML ophthalmic solution 1 drop 2 (two) times daily.      Historical Provider, MD  gemfibrozil (LOPID) 600 MG tablet Take 600 mg by mouth 2 (two) times daily before a meal.  02/25/13   Historical Provider, MD  hydrALAZINE (APRESOLINE) 25 MG tablet Take 1 tablet (25 mg total) by mouth 2 (two) times daily. 12/31/14   Belkys A Regalado, MD  HYDROcodone-acetaminophen (NORCO/VICODIN) 5-325 MG per tablet Take 1 tablet by mouth every 4 (four) hours as needed for moderate pain (mild pain). Patient not taking: Reported on 01/03/2015 12/28/14   Eugenie Filler, MD  latanoprost (XALATAN) 0.005 % ophthalmic solution Place 1 drop into both eyes at bedtime.    Historical Provider, MD  lisinopril (PRINIVIL,ZESTRIL) 40 MG tablet Take 1 tablet (40 mg total) by mouth daily. 12/28/14   Eugenie Filler, MD  meclizine (ANTIVERT) 25 MG tablet Take 12.5 mg by mouth 3 (three) times daily as needed for dizziness.     Historical Provider, MD  metoprolol tartrate (LOPRESSOR) 25 MG tablet Take 1 tablet (25 mg total) by mouth 2 (two) times daily. 12/31/14   Belkys A Regalado, MD  Multiple Vitamin (MULTIVITAMIN) tablet Take 1 tablet by mouth daily.    Historical Provider, MD  pilocarpine (PILOCAR) 4 % ophthalmic solution Place 1 drop into both eyes at bedtime. 02/17/14   Historical Provider, MD  Polyethyl Glycol-Propyl Glycol (SYSTANE OP) Apply 1 drop to eye as needed (for dry eye relief).     Historical Provider, MD  Simethicone (GAS-X PO) Take 1 tablet by mouth daily as needed (for gas relief).     Historical Provider, MD  traMADol (ULTRAM) 50 MG tablet Take 1 tablet (50 mg total) by mouth 2 (two) times daily. 12/28/14   Eugenie Filler, MD  Vitamin D, Ergocalciferol, (DRISDOL) 50000 UNITS CAPS capsule Take 50,000 Units by mouth every 30 (thirty) days.     Historical Provider, MD      ANALYSIS: A 16 channel recording using standard 10 20  measurements is conducted for 23 minutes. There is a posterior dominant rhythm of 10 Hz which attenuates with eye opening. There is beta activity observed in the frontal areas. Awake and sleep activities are observed. K complexes and spindles are seen briefly throughout the recording. Photic stimulation and hyperventilation are not carried out. The patient is noted to have a couple of sharp wave activity involving the left temporal region particularly T3. There is no focal or lateralized slowing. There are no electrographic seizures observed.   IMPRESSION: This recording is slightly abnormal showing rare left temporal sharp wave  activities which can be associated with clinically partial onset seizures.      Ivanna Kocak A. Merlene Laughter, M.D.  Diplomate, Tax adviser of Psychiatry and Neurology ( Neurology).

## 2015-01-15 ENCOUNTER — Inpatient Hospital Stay
Admission: RE | Admit: 2015-01-15 | Discharge: 2015-01-26 | Disposition: A | Discharge status: Nursing Home (Includes ICF) | Payer: Medicare Other | Source: Ambulatory Visit | Attending: Internal Medicine | Admitting: Internal Medicine

## 2015-01-15 DIAGNOSIS — Z8673 Personal history of transient ischemic attack (TIA), and cerebral infarction without residual deficits: Principal | ICD-10-CM

## 2015-01-18 ENCOUNTER — Non-Acute Institutional Stay (SKILLED_NURSING_FACILITY): Payer: Medicare Other | Admitting: Internal Medicine

## 2015-01-18 DIAGNOSIS — I679 Cerebrovascular disease, unspecified: Secondary | ICD-10-CM | POA: Diagnosis not present

## 2015-01-18 DIAGNOSIS — M4800 Spinal stenosis, site unspecified: Secondary | ICD-10-CM | POA: Diagnosis not present

## 2015-01-18 DIAGNOSIS — I10 Essential (primary) hypertension: Secondary | ICD-10-CM

## 2015-01-18 DIAGNOSIS — D72829 Elevated white blood cell count, unspecified: Secondary | ICD-10-CM | POA: Insufficient documentation

## 2015-01-18 LAB — CBC WITH DIFFERENTIAL/PLATELET
Basophils Absolute: 0 10*3/uL (ref 0.0–0.1)
Basophils Relative: 1 % (ref 0–1)
Eosinophils Absolute: 0.3 10*3/uL (ref 0.0–0.7)
Eosinophils Relative: 5 % (ref 0–5)
HCT: 30.2 % — ABNORMAL LOW (ref 36.0–46.0)
Hemoglobin: 9.7 g/dL — ABNORMAL LOW (ref 12.0–15.0)
Lymphocytes Relative: 26 % (ref 12–46)
Lymphs Abs: 1.6 10*3/uL (ref 0.7–4.0)
MCH: 30 pg (ref 26.0–34.0)
MCHC: 32.1 g/dL (ref 30.0–36.0)
MCV: 93.5 fL (ref 78.0–100.0)
Monocytes Absolute: 1 10*3/uL (ref 0.1–1.0)
Monocytes Relative: 16 % — ABNORMAL HIGH (ref 3–12)
Neutro Abs: 3.2 10*3/uL (ref 1.7–7.7)
Neutrophils Relative %: 52 % (ref 43–77)
Platelets: 481 10*3/uL — ABNORMAL HIGH (ref 150–400)
RBC: 3.23 MIL/uL — ABNORMAL LOW (ref 3.87–5.11)
RDW: 14.3 % (ref 11.5–15.5)
WBC: 6.1 10*3/uL (ref 4.0–10.5)

## 2015-01-18 LAB — BASIC METABOLIC PANEL
Anion gap: 7 (ref 5–15)
BUN: 19 mg/dL (ref 6–20)
CO2: 26 mmol/L (ref 22–32)
Calcium: 10 mg/dL (ref 8.9–10.3)
Chloride: 110 mmol/L (ref 101–111)
Creatinine, Ser: 0.94 mg/dL (ref 0.44–1.00)
GFR calc Af Amer: >60 mL/min (ref >60)
GFR calc non Af Amer: 55 mL/min — ABNORMAL LOW (ref >60)
Glucose, Bld: 95 mg/dL (ref 65–99)
Potassium: 3.7 mmol/L (ref 3.5–5.1)
Sodium: 143 mmol/L (ref 135–145)

## 2015-01-18 NOTE — Progress Notes (Signed)
Patient ID: Jodi Peters, female   DOB: 1929-11-02, 81 y.o.   MRN: [id number]    This is an acute visit.  Level care skilled.  Facility [facility].  Chief complaint acute visit secondary to hypertension   HPI-; patient is an 81 year old female who had .  previously been at [facility] presenting with bilateral thigh pain lower extremity weakness. This started 2 weeks before the admission but became progressive to the point where the patient was not able to ambulate to. She was discovered by MRI to have myelopathy at T11-T12 and also lumbar spinal stenosis at L3-L4. She underwent bilateral laminotomies at T11-T12, decompression of lumbar spinal stenosis operated by Dr. Ellene Route on 7/15. She had postoperative delirium which was felt to be medication induced.  Dr. Dellia Nims saw her last month he noted left hemi-neglect extreme left arm weakness [not antigravity]. Her speech was soft, barely audible. She could not ambulate.   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 When she had left Dr. Dellia Nims already noted improvement in the left arm weakness and her left hemi-neglect. In she spent several more hours in the emergency room with her CT scan listed below. It was noted that she went on to have further recovery. There is no history of headache, diplopia, altered sensation. No prior seizure history   Since then and MRI follow-up has been ordered which showed a 2.7 cm acute/subacute infarction in the right posterior frontal white matter with minor overlying cortical involvement no hemorrhage no acute strokes elsewhere within the brain.  Also showed chronic small vessel ischemic changes elsewhere affecting the cerebral hemispheric white matter.  Clinically she appears to be at baseline again moving all extremities with baseline strength--although appears have some left-sided weakness when ambulating per physical therapy.  She does have a history of hypertension she is  on numerous agents including Lopressor 25 mg twice a day Norvasc 10 mg a day lisinopril 40 mg a day and hydralazine 25 mg twice a day.  Recent blood pressures appear to be somewhat elevated I got 182/72 manual other readings 164/69 I also see 139/71 so there is some variability she does not complain of any dizziness headache syncopal-type feelings chest pain or palpitations.      Past Medical History  Diagnosis Date  . Hyperlipidemia   . Hypertension   . Mitral regurgitation     sinus bradycardia  . Breast pain     leftr  . Venous insufficiency   . Allergic rhinitis   . Seborrheic keratosis   . Breast mass     left  . Vertigo   . Carpal tunnel syndrome   . Cataract   . Glaucoma   . Constipation   . IBS (irritable bowel syndrome)   . Osteopenia   . Osteoarthritis   . Low back pain   . Breast cancer   . Anxiety   . Vitamin D deficiency   . Bradycardia   . Carotid atherosclerosis   . Rectocele 06/30/2014  . Cystocele 06/30/2014    Current medications I Hydralazine 25 twice a day Norco 5/325 every 4 when necessary Lopressor 25 twice a day Xanax 0.5 twice a day when necessary anxiety Norvasc 10 daily Lisinopril 40 daily Tramadol 50 mg twice a day Tylenol 500 every 6 hours when necessary Enteric-coated aspirin 81 daily Alphagan ophthalmic  into both eyes twice a day Flexeril 5 mg twice a day when necessary Benadryl 25 at bedtime when necessary allergies  Cosopt ophthalmic 1 drop twice a day Lopid 600 twice a day Xalatan at .005% ophthalmic 1 drop in the both eyes at bedtime Pilocar 4% 1 drop into both eyes at bedtime Meclizine 12.53 times a day when necessary  Review of systems General does not have any complaints no fever or chills HEENT no headache or diplopia Cardiac no chest pain or palpitations GI no nausea or vomiting GU no voiding difficulties brackets note urinary retention in the hospital"bladder scan here has been unremarkable-- Muscle-skeletal is not  really complaining of pain today she is status post laminectomies Neurologic; does not complain of headache or dizziness appears to be at baseline.  Physical examination Enter 98.7 pulse 76 respirations 18 blood pressures as noted in history of present illness Gen.  frail elderly female in no distress appears comfortable.  Her skin is warm and dry. Respiratory clear entry bilaterally Cardiac Regular rate and rhythm with a 2/6 systolic murmur--mild pedal edema bilaterally Abdomen; soft nontender positive bowel sounds GU bladder is not enlarged obviously at the bedside Musculoskeletal as noted below-did not note any deformities she is able to move all extremities Neurologic; cranial nerves extraocular movements are normal visual fields are normal Her speech is clear-she is able to move all her extremities with baseline strength here I could not really appreciate lateralizing findings although apparently when ambulating therapy does still feel she has some left-sided weakness .  Labs  01/18/2015.  WBC 6.1 hemoglobin 9.7 platelets 481.  Sodium 143 potassium 3.7 BUN 19 creatinine 0.94.  Marland Kitchen  01/04/2015.  Sodium 139 potassium 3.8 BUN 29 creatinine 1.2.  WBC 10.6 hemoglobin 10.6 platelets 642.    Impression/plan  #1 hypertension--we'll increase her hydralazine to 3 times a day-i Per chart review it appears hydralazine was started upon discharge secondary to some elevated blood pressures in the hospital will titrate this up to 3 times a day and monitor her blood pressures every shift   #2 mass in the right frontal cortical white matter--cerebrovascular disease--MRI follow-up with results noted above-this  Previously  was discussed with Dr. Dellia Nims via phone-aspirin was discontinued and she has been started on Plavix--continues to be  relatively at baseline.  #3 history of spinal stenosis status post laminectomies-her pain actually appears to be under decent control continues on muscle  relaxer as needed as well as tramadol twice a day when necessary apparently this is helping   [phone]   .

## 2015-01-25 ENCOUNTER — Non-Acute Institutional Stay (SKILLED_NURSING_FACILITY): Payer: Medicare Other | Admitting: Internal Medicine

## 2015-01-25 DIAGNOSIS — M4804 Spinal stenosis, thoracic region: Secondary | ICD-10-CM

## 2015-01-25 DIAGNOSIS — I63511 Cerebral infarction due to unspecified occlusion or stenosis of right middle cerebral artery: Secondary | ICD-10-CM

## 2015-01-25 DIAGNOSIS — I071 Rheumatic tricuspid insufficiency: Secondary | ICD-10-CM

## 2015-01-26 ENCOUNTER — Emergency Department (HOSPITAL_COMMUNITY)
Admission: EM | Admit: 2015-01-26 | Discharge: 2015-01-26 | Disposition: A | Discharge status: Skilled Nursing Facility | Payer: Medicare Other | Attending: Emergency Medicine | Admitting: Emergency Medicine

## 2015-01-26 ENCOUNTER — Non-Acute Institutional Stay (SKILLED_NURSING_FACILITY): Payer: Medicare Other | Admitting: Internal Medicine

## 2015-01-26 ENCOUNTER — Encounter (HOSPITAL_COMMUNITY): Payer: Self-pay | Admitting: Emergency Medicine

## 2015-01-26 ENCOUNTER — Emergency Department (HOSPITAL_COMMUNITY): Payer: Medicare Other

## 2015-01-26 DIAGNOSIS — I2510 Atherosclerotic heart disease of native coronary artery without angina pectoris: Secondary | ICD-10-CM | POA: Diagnosis not present

## 2015-01-26 DIAGNOSIS — I639 Cerebral infarction, unspecified: Secondary | ICD-10-CM | POA: Diagnosis not present

## 2015-01-26 DIAGNOSIS — I10 Essential (primary) hypertension: Secondary | ICD-10-CM | POA: Insufficient documentation

## 2015-01-26 DIAGNOSIS — Z853 Personal history of malignant neoplasm of breast: Secondary | ICD-10-CM | POA: Insufficient documentation

## 2015-01-26 DIAGNOSIS — H409 Unspecified glaucoma: Secondary | ICD-10-CM | POA: Diagnosis not present

## 2015-01-26 DIAGNOSIS — E559 Vitamin D deficiency, unspecified: Secondary | ICD-10-CM | POA: Insufficient documentation

## 2015-01-26 DIAGNOSIS — Z880 Allergy status to penicillin: Secondary | ICD-10-CM | POA: Insufficient documentation

## 2015-01-26 DIAGNOSIS — Z872 Personal history of diseases of the skin and subcutaneous tissue: Secondary | ICD-10-CM | POA: Diagnosis not present

## 2015-01-26 DIAGNOSIS — M1990 Unspecified osteoarthritis, unspecified site: Secondary | ICD-10-CM | POA: Diagnosis not present

## 2015-01-26 DIAGNOSIS — Z7902 Long term (current) use of antithrombotics/antiplatelets: Secondary | ICD-10-CM | POA: Insufficient documentation

## 2015-01-26 DIAGNOSIS — Z8709 Personal history of other diseases of the respiratory system: Secondary | ICD-10-CM | POA: Insufficient documentation

## 2015-01-26 DIAGNOSIS — F419 Anxiety disorder, unspecified: Secondary | ICD-10-CM | POA: Insufficient documentation

## 2015-01-26 DIAGNOSIS — Z79899 Other long term (current) drug therapy: Secondary | ICD-10-CM | POA: Diagnosis not present

## 2015-01-26 DIAGNOSIS — G459 Transient cerebral ischemic attack, unspecified: Secondary | ICD-10-CM | POA: Diagnosis not present

## 2015-01-26 DIAGNOSIS — R001 Bradycardia, unspecified: Secondary | ICD-10-CM

## 2015-01-26 DIAGNOSIS — Z8742 Personal history of other diseases of the female genital tract: Secondary | ICD-10-CM | POA: Diagnosis not present

## 2015-01-26 LAB — I-STAT CHEM 8, ED
BUN: 25 mg/dL — ABNORMAL HIGH (ref 6–20)
CALCIUM ION: 1.37 mmol/L — AB (ref 1.13–1.30)
CHLORIDE: 109 mmol/L (ref 101–111)
Creatinine, Ser: 1.5 mg/dL — ABNORMAL HIGH (ref 0.44–1.00)
GLUCOSE: 113 mg/dL — AB (ref 65–99)
HCT: 33 % — ABNORMAL LOW (ref 36.0–46.0)
Hemoglobin: 11.2 g/dL — ABNORMAL LOW (ref 12.0–15.0)
Potassium: 4.3 mmol/L (ref 3.5–5.1)
Sodium: 142 mmol/L (ref 135–145)
TCO2: 22 mmol/L (ref 0–100)

## 2015-01-26 LAB — URINALYSIS, ROUTINE W REFLEX MICROSCOPIC
Bilirubin Urine: NEGATIVE
Glucose, UA: NEGATIVE mg/dL
HGB URINE DIPSTICK: NEGATIVE
Ketones, ur: NEGATIVE mg/dL
Nitrite: NEGATIVE
PROTEIN: NEGATIVE mg/dL
Specific Gravity, Urine: 1.01 (ref 1.005–1.030)
UROBILINOGEN UA: 0.2 mg/dL (ref 0.0–1.0)
pH: 6 (ref 5.0–8.0)

## 2015-01-26 LAB — CBC
HEMATOCRIT: 33.6 % — AB (ref 36.0–46.0)
Hemoglobin: 10.5 g/dL — ABNORMAL LOW (ref 12.0–15.0)
MCH: 29.5 pg (ref 26.0–34.0)
MCHC: 31.3 g/dL (ref 30.0–36.0)
MCV: 94.4 fL (ref 78.0–100.0)
Platelets: 434 10*3/uL — ABNORMAL HIGH (ref 150–400)
RBC: 3.56 MIL/uL — AB (ref 3.87–5.11)
RDW: 14.7 % (ref 11.5–15.5)
WBC: 8.1 10*3/uL (ref 4.0–10.5)

## 2015-01-26 LAB — COMPREHENSIVE METABOLIC PANEL
ALK PHOS: 114 U/L (ref 38–126)
ALT: 11 U/L — AB (ref 14–54)
AST: 18 U/L (ref 15–41)
Albumin: 3.8 g/dL (ref 3.5–5.0)
Anion gap: 9 (ref 5–15)
BILIRUBIN TOTAL: 0.5 mg/dL (ref 0.3–1.2)
BUN: 25 mg/dL — ABNORMAL HIGH (ref 6–20)
CALCIUM: 10.4 mg/dL — AB (ref 8.9–10.3)
CO2: 25 mmol/L (ref 22–32)
CREATININE: 1.5 mg/dL — AB (ref 0.44–1.00)
Chloride: 109 mmol/L (ref 101–111)
GFR, EST AFRICAN AMERICAN: 36 mL/min — AB (ref >60)
GFR, EST NON AFRICAN AMERICAN: 31 mL/min — AB (ref >60)
Glucose, Bld: 116 mg/dL — ABNORMAL HIGH (ref 65–99)
Potassium: 4.4 mmol/L (ref 3.5–5.1)
Sodium: 143 mmol/L (ref 135–145)
Total Protein: 7 g/dL (ref 6.5–8.1)

## 2015-01-26 LAB — DIFFERENTIAL
Basophils Absolute: 0 10*3/uL (ref 0.0–0.1)
Basophils Relative: 1 % (ref 0–1)
Eosinophils Absolute: 0.1 10*3/uL (ref 0.0–0.7)
Eosinophils Relative: 1 % (ref 0–5)
LYMPHS PCT: 24 % (ref 12–46)
Lymphs Abs: 1.9 10*3/uL (ref 0.7–4.0)
MONO ABS: 1 10*3/uL (ref 0.1–1.0)
MONOS PCT: 13 % — AB (ref 3–12)
NEUTROS ABS: 5 10*3/uL (ref 1.7–7.7)
Neutrophils Relative %: 61 % (ref 43–77)

## 2015-01-26 LAB — URINE MICROSCOPIC-ADD ON

## 2015-01-26 LAB — I-STAT TROPONIN, ED: TROPONIN I, POC: 0.02 ng/mL (ref 0.00–0.08)

## 2015-01-26 LAB — RAPID URINE DRUG SCREEN, HOSP PERFORMED
AMPHETAMINES: NOT DETECTED
Barbiturates: NOT DETECTED
Benzodiazepines: NOT DETECTED
Cocaine: NOT DETECTED
Opiates: NOT DETECTED
Tetrahydrocannabinol: NOT DETECTED

## 2015-01-26 LAB — ETHANOL

## 2015-01-26 LAB — PROTIME-INR
INR: 1.1 (ref 0.00–1.49)
Prothrombin Time: 14.4 seconds (ref 11.6–15.2)

## 2015-01-26 LAB — APTT: aPTT: 27 seconds (ref 24–37)

## 2015-01-26 NOTE — ED Notes (Signed)
Report given to staff at [facility]. Staff reports that someone will be sent to get pt via wheel chair from the tunnel.

## 2015-01-26 NOTE — Progress Notes (Signed)
Patient ID: Jodi Peters, female   DOB: 06/10/1930, 81 y.o.   MRN: [id number]    This is an acute visit.  Level care skilled.  Facility [facility].  Chief complaint acute visit secondary to increased left-sided weakness slurring   HPI-; patient is an 81 year old female who had .  previously been at [facility] presenting with bilateral thigh pain lower extremity weakness. This started 2 weeks before the admission but became progressive to the point where the patient was not able to ambulate to. She was discovered by MRI to have myelopathy at T11-T12 and also lumbar spinal stenosis at L3-L4. She underwent bilateral laminotomies at T11-T12, decompression of lumbar spinal stenosis operated by Dr. Ellene Route on 7/15. She had postoperative delirium which was felt to be medication induced.  Dr. Dellia Nims saw her shortly after her admission here he noted left hemi-neglect extreme left arm weakness [not antigravity]. Her speech was soft, barely audible. She could not ambulate.   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 When she had left Dr. Dellia Nims already noted improvement in the left arm weakness and her left hemi-neglect.  she spent several more hours in the emergency room with her CT scan listed below. It was noted that she went on to have further recovery. There is no history of headache, diplopia, altered sensation. No prior seizure history   Since then and MRI follow-up has been ordered which showed a 2.7 cm acute/subacute infarction in the right posterior frontal white matter with minor overlying cortical involvement no hemorrhage no acute strokes elsewhere within the brain.  Also showed chronic small vessel ischemic changes elsewhere affecting the cerebral hemispheric white matter.   subsequentlyshe has been started on Plavix-she also had a neurology consult and it was thought that any seizure activity contributed this was probably minimal from what I can tell  from the findings.  She also had a cardiac echo which appear to be nondiagnostic and a carotid ultrasound is pending actually for tomorrow.   this afternoon patient was being evaluated by physical was found to have increased slurring and left side weakness when I evaluated her and this appeared to be present she was talking with some slurring of speech and weakness on the left as well--grip strengthon the left upper extremity was reduced compared to the right more so than on previous exams  She was also noted to be somewhat bradycardic with a pulse in the  low 04U systolic blood pressure around 100.  I do note she is on Lopressor.  .  .        CLINICAL DATA:  81 year old hypertensive female with hyperlipidemia 1 week post laminectomy. Recent left arm weakness slurred speech and left-sided neglect. Initial encounter.   EXAM: CT HEAD WITHOUT CONTRAST   TECHNIQUE: Contiguous axial images were obtained from the base of the skull through the vertex without intravenous contrast.   COMPARISON:  09/30/2009.   FINDINGS: Rounded 2 cm hypodensity right frontal subcortical white matter new from 2011. This does not have the typical appearance of an infarct. Underlying mass therefore cannot be excluded and MR of the brain with and without contrast recommended for further delineation.   No intracranial hemorrhage.   No hydrocephalus.   Markedly enlarged left ear unchanged from prior exam.   Vascular calcifications.   IMPRESSION: Rounded 2 cm hypodensity right frontal subcortical white matter new from 2011. This does not have the typical appearance of an infarct. Underlying mass therefore cannot  be excluded and MR of the brain with and without contrast recommended for further delineation.     Electronically Signed   By: Genia Del M.D.   On: 01/03/2015 15:56        Past Medical History  Diagnosis Date  . Hyperlipidemia   . Hypertension   . Mitral regurgitation     sinus  bradycardia  . Breast pain     leftr  . Venous insufficiency   . Allergic rhinitis   . Seborrheic keratosis   . Breast mass     left  . Vertigo   . Carpal tunnel syndrome   . Cataract   . Glaucoma   . Constipation   . IBS (irritable bowel syndrome)   . Osteopenia   . Osteoarthritis   . Low back pain   . Breast cancer   . Anxiety   . Vitamin D deficiency   . Bradycardia   . Carotid atherosclerosis   . Rectocele 06/30/2014  . Cystocele 06/30/2014    Current medications Keflex 500 mg every 8 for 5 days for a Proteus UTI Hydralazine 25 twice a day Norco 5/325 every 4 when necessary Lopressor 25 twice a day Xanax 0.5 twice a day when necessary anxiety Norvasc 10 daily Lisinopril 40 daily Tramadol 50 mg twice a day Tylenol 500 every 6 hours when necessary Larynx 75 mg by mouth daily Alphagan ophthalmic  into both eyes twice a day Flexeril 5 mg twice a day when necessary Benadryl 25 at bedtime when necessary allergies Cosopt ophthalmic 1 drop twice a day Lopid 600 twice a day Xalatan at .005% ophthalmic 1 drop in the both eyes at bedtime Pilocar 4% 1 drop into both eyes at bedtime Meclizine 12.53 times a day when necessary   Family medical social history reviewed per  hospital admission H&P on 12/22/2014   Review of systems--somewhat limited since patient appears to be somewhat weak General does not have any complaints no fever or chills  HEENT no headache or diplopia Cardiac no chest pain or palpitations GI no nausea or vomiting GU no voiding difficulties - Neurologic; does not complain of headache or dizziness but has weaknesses as noted above  Physical examination  She is afebrile pulse of 44 respirations 18 systolic blood pressure noted to be 100-CBG is 117 O2 saturation is in the high 90s on room air. In general this a pleasant elderly female in no distress with left-sided weakness and slurring as noted above Her skin is warm and dry Oropharynx is clear  mucous membranes moist tongue is midline appears with appropriate range of motion. Respiratory clear entry bilaterally Cardiac Cardiac with a pulse in the low 40s with a 2/6 systolic murmur Abdomen; soft nontender positive bowel sounds   Musculoskeletal again does have reduced left upper extremity strength to the right also some  left lower extremity weakness compared to the right although this is more pronounced that appears on the upper extremity.  Neurologic as noted above she does have some slurred speech as well cranial nerves appear grossly intact although initially nursing thought she had some mild deficits  Psych continues to be pleasant and alert .  Labs.  01/04/2015.  Sodium 139 potassium 3.8 BUN 29 creatinine 1.2.  WBC 10.6 hemoglobin 10.6 platelets 642.    Impression/plan  #1-question TIA versus CVA-patient does have neurologic deficits this appears greater than what I have seen previously will send her to the ER for expedient evaluation I also note bradycardia-although she does  not appear to be overtly symptomatic-again somewhat concerned possibly there may be some cardiac etiology as well-.  I did do serial exams before EMS arrived and actually pulse was higher in the 50s by the time EMS arrived-also her left upper extremity weakness appeared to be improving as well and nearing her baseline--  This appears  to be somewhat of a similar presentation that Dr. Dellia Nims saw previously-although speaking with the nurse who was there with Dr. Dellia Nims it was actually more dramatic the first time this occurred     [id number].   Marland Kitchen

## 2015-01-26 NOTE — ED Notes (Signed)
Patient arrives via EMS from [facility] with concerns of left facial droop that started per staff at 1400 with PT while doing PT outside today. Equal grips bilaterally, leg strength equal. Patient able to maneuver in bed without assistance. Denies any pain. LKN time staff around 1245. New onset bradycardia per staff. HR 44 at [facility]

## 2015-01-26 NOTE — ED Provider Notes (Signed)
CSN: [id number]     Arrival date & time 01/26/15  1433 History   First MD Initiated Contact with Patient 01/26/15 1435     Chief Complaint  Patient presents with  . Facial Droop     (Consider location/radiation/quality/duration/timing/severity/associated sxs/prior Treatment) HPI Comments: Patient presents to the emergency department for evaluation of neurologic deficit. Patient reportedly was noted to have worsening of her left-sided weakness while participating in physical therapy earlier today. Patient recently did have a stroke affecting the left side of her body. At arrival to the ER, however, patient does not notice any changes in her symptoms. She does know that she has some mild weakness of the left side, but this is unchanged from previous. Son is with her, does not notice any facial drooping or change in her appearance from her baseline.   Past Medical History  Diagnosis Date  . Hyperlipidemia   . Hypertension   . Mitral regurgitation     sinus bradycardia  . Breast pain     leftr  . Venous insufficiency   . Allergic rhinitis   . Seborrheic keratosis   . Breast mass     left  . Vertigo   . Carpal tunnel syndrome   . Cataract   . Glaucoma   . Constipation   . IBS (irritable bowel syndrome)   . Osteopenia   . Osteoarthritis   . Low back pain   . Breast cancer   . Anxiety   . Vitamin D deficiency   . Bradycardia   . Carotid atherosclerosis   . Rectocele 06/30/2014  . Cystocele 06/30/2014   Past Surgical History  Procedure Laterality Date  . Appendectomy    . Cholecystectomy    . Keloid      left ear  . Eye surgery      for bleed in left eye  . Cyst removed from left breast    . Spurs on back    . Back surgery    . Abdominal hysterectomy    . Lumbar laminectomy/decompression microdiscectomy N/A 12/25/2014    Procedure: Thoracic eleven-twelve Laminectomy/Diskectomy; Lumbar three-four Laminectomy/Diskectomy;  Surgeon: Kristeen Miss, MD;  Location: [facility];   Service: Neurosurgery;  Laterality: N/A;  . Breast lumpectomy     Family History  Problem Relation Age of Onset  . Heart disease Mother   . Hypertension Mother   . Diabetes Mother   . Other Daughter     left breast nodule  . Hypertension Son   . Hyperlipidemia Son   . Heart disease Maternal Grandmother   . Hypertension Maternal Grandmother   . Hypertension Son   . Other Son     boils   Social History  Substance Use Topics  . Smoking status: Never Smoker   . Smokeless tobacco: Never Used  . Alcohol Use: No   OB History    Gravida Para Term Preterm AB TAB SAB Ectopic Multiple Living   5 5        5      Review of Systems  Neurological: Positive for weakness (left side).  All other systems reviewed and are negative.     Allergies  Penicillins  Home Medications   Prior to Admission medications   Medication Sig Start Date End Date Taking? Authorizing Provider  acetaminophen (TYLENOL) 500 MG tablet Take 500 mg by mouth every 6 (six) hours as needed for mild pain or moderate pain.   Yes Historical Provider, MD  ALPRAZolam Duanne Moron) 0.25 MG tablet  Take 0.5-1 tablets (0.125-0.25 mg total) by mouth 2 (two) times daily as needed for anxiety. 12/28/14  Yes Eugenie Filler, MD  amLODipine (NORVASC) 10 MG tablet Take 1 tablet (10 mg total) by mouth daily. 12/28/14  Yes Eugenie Filler, MD  brimonidine (ALPHAGAN) 0.15 % ophthalmic solution Place 1 drop into both eyes 2 (two) times daily.    Yes Historical Provider, MD  clopidogrel (PLAVIX) 75 MG tablet Take 75 mg by mouth daily.   Yes Historical Provider, MD  cyclobenzaprine (FLEXERIL) 5 MG tablet Take 1 tablet (5 mg total) by mouth 2 (two) times daily as needed for muscle spasms. 12/16/14  Yes Nat Christen, MD  dorzolamide-timolol (COSOPT) 22.3-6.8 MG/ML ophthalmic solution 1 drop 2 (two) times daily.     Yes Historical Provider, MD  gemfibrozil (LOPID) 600 MG tablet Take 600 mg by mouth 2 (two) times daily before a meal.  02/25/13  Yes  Historical Provider, MD  hydrALAZINE (APRESOLINE) 25 MG tablet Take 1 tablet (25 mg total) by mouth 2 (two) times daily. Patient taking differently: Take 25 mg by mouth 3 (three) times daily.  12/31/14  Yes Belkys A Regalado, MD  latanoprost (XALATAN) 0.005 % ophthalmic solution Place 1 drop into both eyes at bedtime.   Yes Historical Provider, MD  lisinopril (PRINIVIL,ZESTRIL) 40 MG tablet Take 1 tablet (40 mg total) by mouth daily. 12/28/14  Yes Eugenie Filler, MD  meclizine (ANTIVERT) 25 MG tablet Take 12.5 mg by mouth 3 (three) times daily as needed for dizziness.    Yes Historical Provider, MD  metoprolol tartrate (LOPRESSOR) 25 MG tablet Take 1 tablet (25 mg total) by mouth 2 (two) times daily. 12/31/14  Yes Belkys A Regalado, MD  Multiple Vitamin (MULTIVITAMIN) tablet Take 1 tablet by mouth daily.   Yes Historical Provider, MD  pilocarpine (PILOCAR) 4 % ophthalmic solution Place 1 drop into both eyes at bedtime. 02/17/14  Yes Historical Provider, MD  Polyethyl Glycol-Propyl Glycol (SYSTANE OP) Apply 1 drop to eye as needed (for dry eye relief).    Yes Historical Provider, MD  Simethicone (GAS-X PO) Take 1 tablet by mouth daily as needed (for gas relief).    Yes Historical Provider, MD  traMADol (ULTRAM) 50 MG tablet Take 1 tablet (50 mg total) by mouth 2 (two) times daily. Patient taking differently: Take 50 mg by mouth 2 (two) times daily as needed.  12/28/14  Yes Eugenie Filler, MD  Vitamin D, Ergocalciferol, (DRISDOL) 50000 UNITS CAPS capsule Take 50,000 Units by mouth every 30 (thirty) days.    Yes Historical Provider, MD  HYDROcodone-acetaminophen (NORCO/VICODIN) 5-325 MG per tablet Take 1 tablet by mouth every 4 (four) hours as needed for moderate pain (mild pain). Patient not taking: Reported on 01/03/2015 12/28/14   Eugenie Filler, MD   BP 161/71 mmHg  Pulse 86  Temp(Src) 98.8 F (37.1 C) (Oral)  Resp 17  Ht 5' 4.5" (1.638 m)  Wt 149 lb (67.586 kg)  BMI 25.19 kg/m2  SpO2  100% Physical Exam  Constitutional: She is oriented to person, place, and time. She appears well-developed and well-nourished. No distress.  HENT:  Head: Normocephalic and atraumatic.  Right Ear: Hearing normal.  Left Ear: Hearing normal.  Nose: Nose normal.  Mouth/Throat: Oropharynx is clear and moist and mucous membranes are normal.  Eyes: Conjunctivae and EOM are normal. Pupils are equal, round, and reactive to light.  Neck: Normal range of motion. Neck supple.  Cardiovascular: Regular rhythm, S1 normal  and S2 normal.  Exam reveals no gallop and no friction rub.   No murmur heard. Pulmonary/Chest: Effort normal and breath sounds normal. No respiratory distress. She exhibits no tenderness.  Abdominal: Soft. Normal appearance and bowel sounds are normal. There is no hepatosplenomegaly. There is no tenderness. There is no rebound, no guarding, no tenderness at McBurney's point and negative Murphy's sign. No hernia.  Musculoskeletal: Normal range of motion.  Neurological: She is alert and oriented to person, place, and time. She has normal strength. No cranial nerve deficit or sensory deficit. Coordination normal. GCS eye subscore is 4. GCS verbal subscore is 5. GCS motor subscore is 6.  Extraocular muscle movement: normal No visual field cut Pupils: equal and reactive both direct and consensual response is normal No nystagmus present    Sensory function is intact to light touch, pinprick Proprioception intact  Grip strength 5/5 RUE, 4+/5 LUE No pronator drift Slight difficulty and dysmetria with left upper extremity finger to nose  Lower extremity strength 5/5 against gravity Normal heel to shin bilaterally     Skin: Skin is warm, dry and intact. No rash noted. No cyanosis.  Psychiatric: She has a normal mood and affect. Her speech is normal and behavior is normal. Thought content normal.  Nursing note and vitals reviewed.   ED Course  Procedures (including critical care  time) Labs Review Labs Reviewed  CBC - Abnormal; Notable for the following:    RBC 3.56 (*)    Hemoglobin 10.5 (*)    HCT 33.6 (*)    Platelets 434 (*)    All other components within normal limits  DIFFERENTIAL - Abnormal; Notable for the following:    Monocytes Relative 13 (*)    All other components within normal limits  COMPREHENSIVE METABOLIC PANEL - Abnormal; Notable for the following:    Glucose, Bld 116 (*)    BUN 25 (*)    Creatinine, Ser 1.50 (*)    Calcium 10.4 (*)    ALT 11 (*)    GFR calc non Af Amer 31 (*)    GFR calc Af Amer 36 (*)    All other components within normal limits  URINALYSIS, ROUTINE W REFLEX MICROSCOPIC (NOT AT [facility]) - Abnormal; Notable for the following:    Leukocytes, UA SMALL (*)    All other components within normal limits  URINE MICROSCOPIC-ADD ON - Abnormal; Notable for the following:    Squamous Epithelial / LPF FEW (*)    Bacteria, UA FEW (*)    All other components within normal limits  I-STAT CHEM 8, ED - Abnormal; Notable for the following:    BUN 25 (*)    Creatinine, Ser 1.50 (*)    Glucose, Bld 113 (*)    Calcium, Ion 1.37 (*)    Hemoglobin 11.2 (*)    HCT 33.0 (*)    All other components within normal limits  ETHANOL  PROTIME-INR  APTT  URINE RAPID DRUG SCREEN, HOSP PERFORMED  I-STAT TROPOININ, ED    Imaging Review Ct Head Wo Contrast  01/26/2015   CLINICAL DATA:  Left facial droop.  Recurrent left-sided symptoms.  EXAM: CT HEAD WITHOUT CONTRAST  TECHNIQUE: Contiguous axial images were obtained from the base of the skull through the vertex without intravenous contrast.  COMPARISON:  01/04/2015  FINDINGS: Thickening and enlargement of the left ear noted, query keloid.  Remote lacunar infarct along the genu of the left internal capsule, image 11 series 2.  Prior right frontal white  matter infarct manifest today only as ill-defined hypodensity. This lesion was acute/subacute on 01/04/2015 MRI.  Periventricular white matter and  corona radiata hypodensities favor chronic ischemic microvascular white matter disease.  No new stroke, intracranial hemorrhage, or intracranial mass lesion is identified.  IMPRESSION: 1. Reduced conspicuity of the right frontal lobe infarct compared to 01/03/2015, compatible with interval evolutionary changes, without obvious extension. 2. Periventricular white matter and corona radiata hypodensities favor chronic ischemic microvascular white matter disease. 3. Remote small lacunar infarct inferiorly along the genu of the left internal capsule. 4. Thickened and enlarged appearance of the lobule and helix of the left ear, possibly a keloid, correlate with patient history.   Electronically Signed   By: Van Clines M.D.   On: 01/26/2015 15:49   I have personally reviewed and evaluated these images and lab results as part of my medical decision-making.   EKG Interpretation   Date/Time:  Tuesday January 26 2015 14:40:13 EDT Ventricular Rate:  50 PR Interval:    QRS Duration: 79 QT Interval:  425 QTC Calculation: 387 R Axis:   40 Text Interpretation:  Junctional rhythm Since last tracing (no P-waves  found) Abnormal ekg Confirmed by Sabra Heck  MD, BRIAN ([id number]) on 01/26/2015  2:59:48 PM      MDM   Final diagnoses:  None   possible TIA  Patient brought to the emergency department over concerns of worsening neurologic deficit. Patient has had a recent stroke affecting the left side of her body. She does have a slight facial droop and left hemiparesis as a result stroke. She was noted to have increased symptoms at the nursing home earlier, but by the time she arrived in the ER, I do not see any significant effects. Her son corroborates that this is her normal baseline.  Her workup has been normal. She has not had any neurologic changes here in the ER. Patient is appropriate for discharge and follow-up with her primary doctor, does not require hospitalization or further workup at this time.  Cannot rule out mild TIA, but suspect that there were no neurologic changes prior to arrival.    Orpah Greek, MD 01/26/15 [id number]

## 2015-01-26 NOTE — ED Provider Notes (Signed)
MSE was initiated and I personally evaluated the patient and placed orders (if any) at  2:51 PM on January 26, 2015.  The patient appears stable so that the remainder of the MSE may be completed by another provider.  The patient with recurrent left-sided symptoms after recently being evaluated for stroke at her nursing facility. On exam she has a subtle facial droop however on cranial nerve testing the weakness seems to go away, minimal if any dysmetria with the left upper extremity, normal strength in the bilateral lower extremities, normal cranial nerve function other than that stated, normal speech, normal mental status.  Noemi Chapel, MD 01/26/15 1452

## 2015-01-26 NOTE — Discharge Instructions (Signed)
Transient Ischemic Attack  A transient ischemic attack (TIA) is a "warning stroke" that causes stroke-like symptoms. Unlike a stroke, a TIA does not cause permanent damage to the brain. The symptoms of a TIA can happen very fast and do not last long. It is important to know the symptoms of a TIA and what to do. This can help prevent a major stroke or death.  CAUSES   · A TIA is caused by a temporary blockage in an artery in the brain or neck (carotid artery). The blockage does not allow the brain to get the blood supply it needs and can cause different symptoms. The blockage can be caused by either:  ¨ A blood clot.  ¨ Fatty buildup (plaque) in a neck or brain artery.  RISK FACTORS  · High blood pressure (hypertension).  · High cholesterol.  · Diabetes mellitus.  · Heart disease.  · The build up of plaque in the blood vessels (peripheral artery disease or atherosclerosis).  · The build up of plaque in the blood vessels providing blood and oxygen to the brain (carotid artery stenosis).  · An abnormal heart rhythm (atrial fibrillation).  · Obesity.  · Smoking.  · Taking oral contraceptives (especially in combination with smoking).  · Physical inactivity.  · A diet high in fats, salt (sodium), and calories.  · Alcohol use.  · Use of illegal drugs (especially cocaine and methamphetamine).  · Being female.  · Being African American.  · Being over the age of 55.  · Family history of stroke.  · Previous history of blood clots, stroke, TIA, or heart attack.  · Sickle cell disease.  SYMPTOMS   TIA symptoms are the same as a stroke but are temporary. These symptoms usually develop suddenly, or may be newly present upon awakening from sleep:  · Sudden weakness or numbness of the face, arm, or leg, especially on one side of the body.  · Sudden trouble walking or difficulty moving arms or legs.  · Sudden confusion.  · Sudden personality changes.  · Trouble speaking (aphasia) or understanding.  · Difficulty swallowing.  · Sudden  trouble seeing in one or both eyes.  · Double vision.  · Dizziness.  · Loss of balance or coordination.  · Sudden severe headache with no known cause.  · Trouble reading or writing.  · Loss of bowel or bladder control.  · Loss of consciousness.  DIAGNOSIS   Your caregiver may be able to determine the presence or absence of a TIA based on your symptoms, history, and physical exam. Computed tomography (CT scan) of the brain is usually performed to help identify a TIA. Other tests may be done to diagnose a TIA. These tests may include:  · Electrocardiography.  · Continuous heart monitoring.  · Echocardiography.  · Carotid ultrasonography.  · Magnetic resonance imaging (MRI).  · A scan of the brain circulation.  · Blood tests.  PREVENTION   The risk of a TIA can be decreased by appropriately treating high blood pressure, high cholesterol, diabetes, heart disease, and obesity and by quitting smoking, limiting alcohol, and staying physically active.  TREATMENT   Time is of the essence. Since the symptoms of TIA are the same as a stroke, it is important to seek treatment as soon as possible because you may need a medicine to dissolve the clot (thrombolytic) that cannot be given if too much time has passed. Treatment options vary. Treatment options may include rest, oxygen, intravenous (IV) fluids,   and medicines to thin the blood (anticoagulants). Medicines and diet may be used to address diabetes, high blood pressure, and other risk factors. Measures will be taken to prevent short-term and long-term complications, including infection from breathing foreign material into the lungs (aspiration pneumonia), blood clots in the legs, and falls. Treatment options include procedures to either remove plaque in the carotid arteries or dilate carotid arteries that have narrowed due to plaque. Those procedures are:  · Carotid endarterectomy.  · Carotid angioplasty and stenting.  HOME CARE INSTRUCTIONS   · Take all medicines prescribed  by your caregiver. Follow the directions carefully. Medicines may be used to control risk factors for a stroke. Be sure you understand all your medicine instructions.  · You may be told to take aspirin or the anticoagulant warfarin. Warfarin needs to be taken exactly as instructed.  ¨ Taking too much or too little warfarin is dangerous. Too much warfarin increases the risk of bleeding. Too little warfarin continues to allow the risk for blood clots. While taking warfarin, you will need to have regular blood tests to measure your blood clotting time. A PT blood test measures how long it takes for blood to clot. Your PT is used to calculate another value called an INR. Your PT and INR help your caregiver to adjust your dose of warfarin. The dose can change for many reasons. It is critically important that you take warfarin exactly as prescribed.  ¨ Many foods, especially foods high in vitamin K can interfere with warfarin and affect the PT and INR. Foods high in vitamin K include spinach, kale, broccoli, cabbage, collard and turnip greens, brussels sprouts, peas, cauliflower, seaweed, and parsley as well as beef and pork liver, green tea, and soybean oil. You should eat a consistent amount of foods high in vitamin K. Avoid major changes in your diet, or notify your caregiver before changing your diet. Arrange a visit with a dietitian to answer your questions.  ¨ Many medicines can interfere with warfarin and affect the PT and INR. You must tell your caregiver about any and all medicines you take, this includes all vitamins and supplements. Be especially cautious with aspirin and anti-inflammatory medicines. Do not take or discontinue any prescribed or over-the-counter medicine except on the advice of your caregiver or pharmacist.  ¨ Warfarin can have side effects, such as excessive bruising or bleeding. You will need to hold pressure over cuts for longer than usual. Your caregiver or pharmacist will discuss other  potential side effects.  ¨ Avoid sports or activities that may cause injury or bleeding.  ¨ Be mindful when shaving, flossing your teeth, or handling sharp objects.  ¨ Alcohol can change the body's ability to handle warfarin. It is best to avoid alcoholic drinks or consume only very small amounts while taking warfarin. Notify your caregiver if you change your alcohol intake.  ¨ Notify your dentist or other caregivers before procedures.  · Eat a diet that includes 5 or more servings of fruits and vegetables each day. This may reduce the risk of stroke. Certain diets may be prescribed to address high blood pressure, high cholesterol, diabetes, or obesity.  ¨ A low-sodium, low-saturated fat, low-trans fat, low-cholesterol diet is recommended to manage high blood pressure.  ¨ A low-saturated fat, low-trans fat, low-cholesterol, and high-fiber diet may control cholesterol levels.  ¨ A controlled-carbohydrate, controlled-sugar diet is recommended to manage diabetes.  ¨ A reduced-calorie, low-sodium, low-saturated fat, low-trans fat, low-cholesterol diet is recommended to manage obesity.  ·   Maintain a healthy weight.  · Stay physically active. It is recommended that you get at least 30 minutes of activity on most or all days.  · Do not smoke.  · Limit alcohol use even if you are not taking warfarin. Moderate alcohol use is considered to be:  ¨ No more than 2 drinks each day for men.  ¨ No more than 1 drink each day for nonpregnant women.  · Stop drug abuse.  · Home safety. A safe home environment is important to reduce the risk of falls. Your caregiver may arrange for specialists to evaluate your home. Having grab bars in the bedroom and bathroom is often important. Your caregiver may arrange for equipment to be used at home, such as raised toilets and a seat for the shower.  · Follow all instructions for follow-up with your caregiver. This is very important. This includes any referrals and lab tests. Proper follow up can  prevent a stroke or another TIA from occurring.  SEEK MEDICAL CARE IF:  · You have personality changes.  · You have difficulty swallowing.  · You are seeing double.  · You have dizziness.  · You have a fever.  · You have skin breakdown.  SEEK IMMEDIATE MEDICAL CARE IF:   Any of these symptoms may represent a serious problem that is an emergency. Do not wait to see if the symptoms will go away. Get medical help right away. Call your local emergency services (911 in [place] Do not drive yourself to the hospital.  · You have sudden weakness or numbness of the face, arm, or leg, especially on one side of the body.  · You have sudden trouble walking or difficulty moving arms or legs.  · You have sudden confusion.  · You have trouble speaking (aphasia) or understanding.  · You have sudden trouble seeing in one or both eyes.  · You have a loss of balance or coordination.  · You have a sudden, severe headache with no known cause.  · You have new chest pain or an irregular heartbeat.  · You have a partial or total loss of consciousness.  MAKE SURE YOU:   · Understand these instructions.  · Will watch your condition.  · Will get help right away if you are not doing well or get worse.  Document Released: 03/08/2005 Document Revised: 06/03/2013 Document Reviewed: 09/03/2013  [employer]® Patient Information ©2015 [employer]. This information is not intended to replace advice given to you by your health care provider. Make sure you discuss any questions you have with your health care provider.

## 2015-01-27 ENCOUNTER — Encounter: Payer: Self-pay | Admitting: Internal Medicine

## 2015-01-27 ENCOUNTER — Ambulatory Visit (HOSPITAL_COMMUNITY)
Admission: RE | Admit: 2015-01-27 | Discharge: 2015-01-27 | Disposition: A | Discharge status: Home / Self Care | Payer: Medicare Other | Source: Ambulatory Visit | Attending: Internal Medicine | Admitting: Internal Medicine

## 2015-01-27 ENCOUNTER — Other Ambulatory Visit: Payer: Self-pay | Admitting: Internal Medicine

## 2015-01-27 ENCOUNTER — Ambulatory Visit (HOSPITAL_COMMUNITY): Admission: RE | Admit: 2015-01-27 | Payer: Medicare Other | Source: Ambulatory Visit | Admitting: Internal Medicine

## 2015-01-27 ENCOUNTER — Non-Acute Institutional Stay (SKILLED_NURSING_FACILITY): Payer: Medicare Other | Admitting: Internal Medicine

## 2015-01-27 DIAGNOSIS — I639 Cerebral infarction, unspecified: Secondary | ICD-10-CM | POA: Insufficient documentation

## 2015-01-27 DIAGNOSIS — N289 Disorder of kidney and ureter, unspecified: Secondary | ICD-10-CM | POA: Diagnosis not present

## 2015-01-27 DIAGNOSIS — R001 Bradycardia, unspecified: Secondary | ICD-10-CM

## 2015-01-27 DIAGNOSIS — I6522 Occlusion and stenosis of left carotid artery: Secondary | ICD-10-CM | POA: Diagnosis not present

## 2015-01-27 DIAGNOSIS — I679 Cerebrovascular disease, unspecified: Secondary | ICD-10-CM

## 2015-01-27 DIAGNOSIS — G459 Transient cerebral ischemic attack, unspecified: Secondary | ICD-10-CM

## 2015-01-27 NOTE — Progress Notes (Addendum)
Patient ID: Jodi Peters, female   DOB: Jul 25, 1929, 81 y.o.   MRN: [id number]                PROGRESS NOTE  DATE:  01/25/2015          FACILITY: [facility]                 LEVEL OF CARE:   SNF   Acute Visit                             CHIEF COMPLAINT:  Follow up CVA, neurologic status.    HISTORY OF PRESENT ILLNESS:  Jodi Peters is an 81 year-old lady who initially came to [place] after a stay at [facility] from 12/22/2014 through 12/31/2014.     She had been noted to have a rapidly progressive thoracic myelopathy from T11 to T12 and severe stenosis at L3-L4.  She underwent corrective stabilizing surgery by Dr. Ellene Route.  The patient tolerated the procedure fairly well.  She did have some postoperative delirium, which resolved.    When I arrived to see this lady on 01/04/2015, I was surprised to see that she was lethargic, taking me 5-10 minutes to wake her up.  After that, I noted left hemineglect, weakness of the left arm greater than left leg.  There was some suggestion from the nurse that was with me on that day that some of this may have started a day before.  I sent her to the ER where she started to have a remarkable recovery.    A CT scan of the head was done that showed an indeterminate area in the frontal lobe white matter.    We went on to do an MRI of the brain on 01/04/2015 in follow-up from the ER visit.  This showed a 2.7 cm acute/subacute infarction in the right posterior frontal white matter with minor overlying cortical involvement.  There was chronic small vessel ischemic changes.  I changed her from aspirin to Plavix.    Echocardiogram showed an EF of 40-98%, grade 1 diastolic dysfunction, trivial aortic regurgitation, moderate tricuspid regurgitation with a pulmonary artery pressure of 44, mild mitral regurgitation.    Carotid ultrasound had been previously done in December 2015.  I am not exactly sure of the issue here.  At this time, both vertebral artery flow  was anterograde.  There was minor plaque formation, less than 50%, in the right carotid and left carotid.    Since then, the patient appears to be making a really good recovery.  She is able to walk with a walker.  She states that she has not noted any left arm weakness.  She reports that Physical Therapy states her left leg tends to exorotate when she walks, although she does not notice this.    CURRENT MEDICATIONS:  Medication list is reviewed.            Plavix 75 q.d.      Antivert 12.5 three times a day p.r.n.      Colace 100 b.i.d.     Cosopt ophthalmic.    Brimonidine ophthalmic.    Cyclobenzaprine.    Lisinopril 40 q.d.      Vitamin D2, 50,000 U monthly  Lopid 600 a day.      Metoprolol 12.5 b.i.d.      Norvasc 10 q.d.            Pilocarpine  ophthalmic.    Xalatan ophthalmic.    Xanax 0.25 twice daily.    Hydralazine 25 mg twice daily.    REVIEW OF SYSTEMS:    GENERAL:  The patient does not feel systemically unwell.  No fever, chills, or weight loss.   CHEST/RESPIRATORY:  No cough.  No sputum.   CARDIAC:  No chest pain.   GI:  No nausea, vomiting, or diarrhea.     GU:  No dysuria.   MUSCULOSKELETAL:   States her back pain is considerably better.   She has no joint pain.   NEUROLOGICAL:  She does not notice her left arm to be weak.  There is no diplopia.  No swallowing difficulties.    PHYSICAL EXAMINATION:   GENERAL APPEARANCE:  The patient continues to look much better.   CHEST/RESPIRATORY:  Clear air entry bilaterally.    CARDIOVASCULAR:   CARDIAC:  There is a 2/6 pansystolic murmur at the lower left sternal border PMI that does radiate into the axilla.      GASTROINTESTINAL:   LIVER/SPLEEN/KIDNEYS:  No liver, no spleen.  No tenderness.     CIRCULATION:   EDEMA/VARICOSITIES:  Extremities:  No edema.   MUSCULOSKELETAL:   BACK:  No tenderness is noted along her incision.  No evidence of infection.   NEUROLOGICAL:    CRANIAL NERVES:  Mild left  nasolabial fold flattening.  Her visual fields are intact.  Extraocular movements are normal.   MOTOR:  She does not have pronator drift.   SENSATION/STRENGTH:  Left leg:  Her strength seems normal to me.   DEEP TENDON REFLEXES:  Reflexes are symmetric.   Toes are downgoing.   BALANCE/GAIT:  I did not test this.   PSYCHIATRIC:   MENTAL STATUS:  She is bright, alert, conversational.  No  evidence of depression.    ASSESSMENT/PLAN:              Right frontal lobe white matter infarct.  I must say that I was surprised about this.  This presented much more like a cortical event rather than a white matter event (hemineglect, etc.).  She made a rapid recovery.  I wonder whether a seizure accompanied this event.  I know she has/had followed up with Neurology.  She did have an EEG that I ordered, although I cannot seem to find these results at the moment.    Hypertension.  This appears to be under fairly good control.    Myelopathy in the lower thoracic spine, as well as stenosis at L3-L4.  This is much improved.   She is up walking and is having no pain.    Probably tricuspid and mitral regurgitation.  She will probably need Cardiology follow-up at some point.  I do not consider this to be urgent.    This patient has made a remarkable recovery.  I wonder about a seizure at the time I saw her accompanying the small white matter stroke.  I had looked for Neurology guidance on this.  I chose Plavix ahead of aspirin which she was already on.  I had looked for a comment about this, as well.

## 2015-01-27 NOTE — Progress Notes (Signed)
Patient ID: Jodi Peters, female   DOB: April 24, 1930, 81 y.o.   MRN: [id number]     This is an acute visit.  Level care skilled.  Facility [facility].  Chief complaint acute visit follow-up ER visit for left-sided weakness slurring of speech with history of possible CVA TIA   HPI-; patient is an 81 year old female who had .  previously been at [facility] presenting with bilateral thigh pain lower extremity weakness. This started 2 weeks before the admission but became progressive to the point where the patient was not able to ambulate to. She was discovered by MRI to have myelopathy at T11-T12 and also lumbar spinal stenosis at L3-L4. She underwent bilateral laminotomies at T11-T12, decompression of lumbar spinal stenosis operated by Dr. Ellene Route on 7/15. She had postoperative delirium which was felt to be medication induced.  Dr. Dellia Nims saw her shortly after her admission here he noted left hemi-neglect extreme left arm weakness [not antigravity]. Her speech was soft, barely audible. She could not ambulate.   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 When she had left Dr. Dellia Nims already noted improvement in the left arm weakness and her left hemi-neglect.  she spent several more hours in the emergency room with her CT scan listed below. It was noted that she went on to have further recovery. There is no history of headache, diplopia, altered sensation. No prior seizure history   Since then and MRI follow-up has been ordered which showed a 2.7 cm acute/subacute infarction in the right posterior frontal white matter with minor overlying cortical involvement no hemorrhage no acute strokes elsewhere within the brain.  Also showed chronic small vessel ischemic changes elsewhere affecting the cerebral hemispheric white matter.   subsequentlyshe has been started on Plavix-she also had a neurology consult and it was thought that any seizure activity contributed  this was probably minimal from what I can tell from the findings.  She also had a cardiac echo which appear to be nondiagnostic and a carotid ultrasound is pending actually for tomorrow.  I saw her yesterday somewhat emergently secondary to concerns patient had increased left-sided weakness slurring of speech.  This appeared to be the case when I saw her and EMS was called-vital signs are stable although she was somewhat bradycardic with pulse in the 40s however EKG did not show any acute process --of note she is on Lopressor.  \By the time EMS arrived actually her deficits appear to have resolved and  was the consensus in the ER as well-workup was unremarkable she did have a repeat CT scan which showed decreased conspicuousness of the right frontal lobe infarct Labs were stable I do note creatinine was up a bit from her baseline at 1.5 here it has run around 1.  According nursing she is eating and drinking quite well.  Today she has no complaints she is back at her baseline I could not really appreciate any focal weaknesses   .  Marland Kitchen        CLINICAL DATA:  81 year old hypertensive female with hyperlipidemia 1 week post laminectomy. Recent left arm weakness slurred speech and left-sided neglect. Initial encounter.   EXAM: CT HEAD WITHOUT CONTRAST   TECHNIQUE: Contiguous axial images were obtained from the base of the skull through the vertex without intravenous contrast.   COMPARISON:  09/30/2009.   FINDINGS: Rounded 2 cm hypodensity right frontal subcortical white matter new from 2011. This does not have the typical appearance of  an infarct. Underlying mass therefore cannot be excluded and MR of the brain with and without contrast recommended for further delineation.   No intracranial hemorrhage.   No hydrocephalus.   Markedly enlarged left ear unchanged from prior exam.   Vascular calcifications.   IMPRESSION: Rounded 2 cm hypodensity right frontal subcortical white  matter new from 2011. This does not have the typical appearance of an infarct. Underlying mass therefore cannot be excluded and MR of the brain with and without contrast recommended for further delineation.     Electronically Signed   By: Genia Del M.D.   On: 01/03/2015 15:56        Past Medical History  Diagnosis Date  . Hyperlipidemia   . Hypertension   . Mitral regurgitation     sinus bradycardia  . Breast pain     leftr  . Venous insufficiency   . Allergic rhinitis   . Seborrheic keratosis   . Breast mass     left  . Vertigo   . Carpal tunnel syndrome   . Cataract   . Glaucoma   . Constipation   . IBS (irritable bowel syndrome)   . Osteopenia   . Osteoarthritis   . Low back pain   . Breast cancer   . Anxiety   . Vitamin D deficiency   . Bradycardia   . Carotid atherosclerosis   . Rectocele 06/30/2014  . Cystocele 06/30/2014    Current medications Keflex 500 mg every 8 for 5 days for a Proteus UTI Hydralazine 25 twice a day Norco 5/325 every 4 when necessary Lopressor 25 twice a day Xanax 0.5 twice a day when necessary anxiety Norvasc 10 daily Lisinopril 40 daily Tramadol 50 mg twice a day Tylenol 500 every 6 hours when necessary Larynx 75 mg by mouth daily Alphagan ophthalmic  into both eyes twice a day Flexeril 5 mg twice a day when necessary Benadryl 25 at bedtime when necessary allergies Cosopt ophthalmic 1 drop twice a day Lopid 600 twice a day Xalatan at .005% ophthalmic 1 drop in the both eyes at bedtime Pilocar 4% 1 drop into both eyes at bedtime Meclizine 12.53 times a day when necessary   Family medical social history reviewed per  hospital admission H&P on 12/22/2014   Review of systems-- General says she feels well has no complaints.  Skin does not complain of rashes or itching  HEENT no headache or diplopia Cardiac no chest pain or palpitations GI no nausea or vomiting GU no voiding difficulties Muscle- skeletal does not  complain of joint pain does not complain of increased weakness - Neurologic; does not complain of headache or dizziness   Physical examination She is afebrile pulse 74 respirations 18 blood pressure 132/58 . In general this a pleasant elderly female in no distress  Her skin is warm and dry Oropharynx is clear mucous membranes moist tongue is midline appears with appropriate range of motion. Respiratory clear entry bilaterally Cardiac Cardiac with a pulse in the low 70'ss with a 2/6 systolic murmur Abdomen; soft nontender positive bowel sounds   Musculoskeletal  Could not really appreciate significant lateralizing findings today strength appears relatively equal on the right versus left grip strength appears to be relatively strong bilaterally.  Neurologic  As noted above I could not really appreciate lateralizing findings today  Psych continues to be pleasant and alert .  Labs  01/26/2015.  Sodium 142 potassium 4.3 BUN 25 creatinine 1.5.  Hemoglobin 11.2.  Liver function tests  within normal limits except ALT of 11.  Of note calcium 10.4.  WBC 8.1 hemoglobin 10.5 platelets 434.  Marland Kitchen  01/04/2015.  Sodium 139 potassium 3.8 BUN 29 creatinine 1.2.  WBC 10.6 hemoglobin 10.6 platelets 642.    Impression/plan  #1-question TIA versus CVA- It was thought by ER possibly she had a TIA-she appears to be back at her baseline today she is on Plavix-at this point will monitor again cardiac echo and carotid Dopplers did not appear to be very remarkable CT scan actually showed possible improvement of right frontal lobe infarct   #2 renal insufficiency-creatinine has jumped up a bit will recheck this tomorrow encourage fluids clinically she appears stable.  #3-history of bradycardia-per chart review it appears this is not new appears she is on Lopressor for hypertension this will have to be monitored--she is been asymptomatic - pulses in the 70s  today     [id number]   .

## 2015-01-28 LAB — BASIC METABOLIC PANEL
Anion gap: 7 (ref 5–15)
BUN: 22 mg/dL — ABNORMAL HIGH (ref 6–20)
CO2: 24 mmol/L (ref 22–32)
Calcium: 9.8 mg/dL (ref 8.9–10.3)
Chloride: 110 mmol/L (ref 101–111)
Creatinine, Ser: 1 mg/dL (ref 0.44–1.00)
GFR calc Af Amer: 59 mL/min — ABNORMAL LOW (ref >60)
GFR calc non Af Amer: 51 mL/min — ABNORMAL LOW (ref >60)
Glucose, Bld: 94 mg/dL (ref 65–99)
Potassium: 3.7 mmol/L (ref 3.5–5.1)
Sodium: 141 mmol/L (ref 135–145)

## 2015-02-01 ENCOUNTER — Encounter (HOSPITAL_COMMUNITY)
Admission: RE | Admit: 2015-02-01 | Discharge: 2015-02-01 | Disposition: A | Discharge status: Home / Self Care | Payer: Medicare Other | Source: Skilled Nursing Facility | Attending: Internal Medicine | Admitting: Internal Medicine

## 2015-02-01 LAB — BASIC METABOLIC PANEL
Anion gap: 6 (ref 5–15)
BUN: 27 mg/dL — AB (ref 6–20)
CHLORIDE: 112 mmol/L — AB (ref 101–111)
CO2: 25 mmol/L (ref 22–32)
CREATININE: 0.92 mg/dL (ref 0.44–1.00)
Calcium: 9.9 mg/dL (ref 8.9–10.3)
GFR calc Af Amer: >60 mL/min (ref >60)
GFR calc non Af Amer: 56 mL/min — ABNORMAL LOW (ref >60)
GLUCOSE: 95 mg/dL (ref 65–99)
Potassium: 3.6 mmol/L (ref 3.5–5.1)
SODIUM: 143 mmol/L (ref 135–145)

## 2015-02-01 LAB — CBC WITH DIFFERENTIAL/PLATELET
Basophils Absolute: 0.1 10*3/uL (ref 0.0–0.1)
Basophils Relative: 1 % (ref 0–1)
EOS ABS: 0.2 10*3/uL (ref 0.0–0.7)
EOS PCT: 3 % (ref 0–5)
HCT: 31 % — ABNORMAL LOW (ref 36.0–46.0)
Hemoglobin: 10 g/dL — ABNORMAL LOW (ref 12.0–15.0)
LYMPHS ABS: 2.2 10*3/uL (ref 0.7–4.0)
Lymphocytes Relative: 34 % (ref 12–46)
MCH: 30.2 pg (ref 26.0–34.0)
MCHC: 32.3 g/dL (ref 30.0–36.0)
MCV: 93.7 fL (ref 78.0–100.0)
MONO ABS: 0.7 10*3/uL (ref 0.1–1.0)
MONOS PCT: 11 % (ref 3–12)
Neutro Abs: 3.4 10*3/uL (ref 1.7–7.7)
Neutrophils Relative %: 51 % (ref 43–77)
PLATELETS: 399 10*3/uL (ref 150–400)
RBC: 3.31 MIL/uL — ABNORMAL LOW (ref 3.87–5.11)
RDW: 14.5 % (ref 11.5–15.5)
WBC: 6.6 10*3/uL (ref 4.0–10.5)

## 2015-02-05 ENCOUNTER — Non-Acute Institutional Stay (SKILLED_NURSING_FACILITY): Payer: Medicare Other | Admitting: Internal Medicine

## 2015-02-05 DIAGNOSIS — I10 Essential (primary) hypertension: Secondary | ICD-10-CM

## 2015-02-05 DIAGNOSIS — M25511 Pain in right shoulder: Secondary | ICD-10-CM

## 2015-02-05 DIAGNOSIS — I639 Cerebral infarction, unspecified: Secondary | ICD-10-CM | POA: Diagnosis not present

## 2015-02-05 DIAGNOSIS — M25519 Pain in unspecified shoulder: Secondary | ICD-10-CM | POA: Insufficient documentation

## 2015-02-05 NOTE — Progress Notes (Signed)
Patient ID: Jodi Peters, female   DOB: 1929-08-11, 81 y.o.   MRN: [id number]      This is an acute visit.  Level care skilled.  Facility [facility].  Chief complaint  Acute visit follow-up right shoulder discomfort-hypertension  HPI-; patient is an 81 year old female who had .  previously been at [facility] presenting with bilateral thigh pain lower extremity weakness. This started 2 weeks before the admission but became progressive to the point where the patient was not able to ambulate to. She was discovered by MRI to have myelopathy at T11-T12 and also lumbar spinal stenosis at L3-L4. She underwent bilateral laminotomies at T11-T12, decompression of lumbar spinal stenosis operated by Dr. Ellene Route on 7/15. She had postoperative delirium which was felt to be medication induced.  Dr. Dellia Nims saw her shortly after her admission here he noted left hemi-neglect extreme left arm weakness [not antigravity]. Her speech was soft, barely audible. She could not ambulate.   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 When she had left Dr. Dellia Nims already noted improvement in the left arm weakness and her left hemi-neglect.  she spent several more hours in the emergency room with her CT scan  It was noted that she went on to have further recovery. There is no history of headache, diplopia, altered sensation. No prior seizure history   Since then and MRI follow-up has been ordered which showed a 2.7 cm acute/subacute infarction in the right posterior frontal white matter with minor overlying cortical involvement no hemorrhage no acute strokes elsewhere within the brain.  Also showed chronic small vessel ischemic changes elsewhere affecting the cerebral hemispheric white matter.   subsequentlyshe has been started on Plavix-she also had a neurology consult and it was thought that any seizure activity contributed this was probably minimal from what I can tell from the  findings.  She also had a cardiac echo which appear to be nondiagnostic and a carotid ultrasound which has not shown significant stenosis   She did go to the ER recently for suspicion that possibly she was having again a TIA versus CVA but apparently she was showing no deficits by the time she arrived at the ER-this continues to be the case see has minimal left-sided weakness compared to the right-apparently she is doing quite well she has complained in nursing staff of right shoulder pain she says is chronic.  I did discuss this with her this evening but she does not really want any more medication she says the Tylenol and tramadol is sufficient for her pain that this is arthritic and has been chronic   I have reviewed her blood pressures and they appear to be elevated at 177/76-161/71-I do see 120/72 as well but generally appears to be somewhat elevated-I took it tonight and got in systolic in the 518A as well-she had just been wheeling about in her wheelchair and moving-however nursing staff has alerted me that they did recheck it in about an hour and it still was somewhat elevated in this range-she does not complain of any dizziness headache or chest pain-she is on hydralazine 25 mg 3 times a day lisinopril 40 mg a day Norvasc 10 mg a day and Lopressor 25 mg twice a day-hydralazine was started before discharge secondary to concerns of some elevated blood pressure readings   .  Marland Kitchen        CLINICAL DATA:  81 year old hypertensive female with hyperlipidemia 1 week post laminectomy. Recent left  arm weakness slurred speech and left-sided neglect. Initial encounter.   EXAM: CT HEAD WITHOUT CONTRAST   TECHNIQUE: Contiguous axial images were obtained from the base of the skull through the vertex without intravenous contrast.   COMPARISON:  09/30/2009.   FINDINGS: Rounded 2 cm hypodensity right frontal subcortical white matter new from 2011. This does not have the typical appearance of an  infarct. Underlying mass therefore cannot be excluded and MR of the brain with and without contrast recommended for further delineation.   No intracranial hemorrhage.   No hydrocephalus.   Markedly enlarged left ear unchanged from prior exam.   Vascular calcifications.   IMPRESSION: Rounded 2 cm hypodensity right frontal subcortical white matter new from 2011. This does not have the typical appearance of an infarct. Underlying mass therefore cannot be excluded and MR of the brain with and without contrast recommended for further delineation.     Electronically Signed   By: Genia Del M.D.   On: 01/03/2015 15:56        Past Medical History  Diagnosis Date  . Hyperlipidemia   . Hypertension   . Mitral regurgitation     sinus bradycardia  . Breast pain     leftr  . Venous insufficiency   . Allergic rhinitis   . Seborrheic keratosis   . Breast mass     left  . Vertigo   . Carpal tunnel syndrome   . Cataract   . Glaucoma   . Constipation   . IBS (irritable bowel syndrome)   . Osteopenia   . Osteoarthritis   . Low back pain   . Breast cancer   . Anxiety   . Vitamin D deficiency   . Bradycardia   . Carotid atherosclerosis   . Rectocele 06/30/2014  . Cystocele 06/30/2014    Current medications Keflex 500 mg every 8 for 5 days for a Proteus UTI Hydralazine 25 twice a day Norco 5/325 every 4 when necessary Lopressor 25 twice a day Xanax 0.5 twice a day when necessary anxiety Norvasc 10 daily Lisinopril 40 daily Tramadol 50 mg twice a day Tylenol 500 every 6 hours when necessary Larynx 75 mg by mouth daily Alphagan ophthalmic  into both eyes twice a day Flexeril 5 mg twice a day when necessary Benadryl 25 at bedtime when necessary allergies Cosopt ophthalmic 1 drop twice a day Lopid 600 twice a day Xalatan at .005% ophthalmic 1 drop in the both eyes at bedtime Pilocar 4% 1 drop into both eyes at bedtime Meclizine 12.53 times a day when  necessary   Family medical social history reviewed per  hospital admission H&P on 12/22/2014   Review of systems-- General says she feels well has no complaints.  Skin does not complain of rashes or itching  HEENT no headache or diplopia Cardiac no chest pain or palpitations GI no nausea or vomiting GU no voiding difficulties Muscle- skeletal--right shoulder pain as noted above she says medications are relieving this and does not want further medication - Neurologic; does not complain of headache or dizziness   Physical examination Temperature 97.7 pulse 73 respirations 18 blood pressure 176/76 . In general this a pleasant elderly female in no distress  Her skin is warm and dry Oropharynx is clear mucous membranes moist tongue is midline appears with appropriate range of motion. Respiratory clear entry bilaterally Cardiac Cardiac with a pulse in the low 70'ss with a 2/6 systolic murmur Abdomen; soft nontender positive bowel sounds   Musculoskeletal  Could not really appreciate significant lateralizing findings today strength appears relatively equal on the right versus left grip strength appears to be relatively strong bilaterally Did not note any deformity of the right shoulder tenderness to palpation nor edema her strength again was intact she is able to raise her arm.  Neurologic  As noted above I could not really appreciate lateralizing findings today  Psych continues to be pleasant and alert .  Labs  02/01/2015.  Sodium 143 potassium 3.6 BUN 27 creatinine 0.92-  01/26/2015.  Sodium 142 potassium 4.3 BUN 25 creatinine 1.5.  Hemoglobin 11.2.  Liver function tests within normal limits except ALT of 11.  Of note calcium 10.4.  WBC 8.1 hemoglobin 10.5 platelets 434.  Marland Kitchen  01/04/2015.  Sodium 139 potassium 3.8 BUN 29 creatinine 1.2.  WBC 10.6 hemoglobin 10.6 platelets 642.    Impression/plan  #1 right shoulder pain-as noted above patient does not  want aggressive intervention physical exam was fairly benign at this point monitor-continue pain medications as noted above.  #2 hypertension-will increase her hydralazine to 35 mg 3 times a day continue lisinopril Lopressor and Norvasc at current doses-continue to monitor apparently there is some variability    #3-question TIA versus CVA- It was thought by ER possibly she had a TIA-she appears to be back at her baseline today she is on Plavix-at this point will monitor again cardiac echo and carotid Dopplers did not appear to be very remarkable CT scan actually showed possible improvement of right frontal lobe infarct   #4 renal insufficiency-creatinine appears to be stable case--occasionally she will a creatinine around 1.5 but comes  down without aggressive intervention back to around 1-most recent creatinine 0.92 which is stable .  #5-history of bradycardia-per chart review it appears this is not new appears she is on Lopressor for hypertension this will have to be monitored--she is been asymptomatic - pulses in the 30s today     [id number]   .

## 2015-02-10 ENCOUNTER — Non-Acute Institutional Stay (SKILLED_NURSING_FACILITY): Payer: Medicare Other | Admitting: Internal Medicine

## 2015-02-10 DIAGNOSIS — I639 Cerebral infarction, unspecified: Secondary | ICD-10-CM

## 2015-02-10 DIAGNOSIS — M4804 Spinal stenosis, thoracic region: Secondary | ICD-10-CM

## 2015-02-11 ENCOUNTER — Encounter: Payer: Self-pay | Admitting: Internal Medicine

## 2015-02-12 DIAGNOSIS — I639 Cerebral infarction, unspecified: Secondary | ICD-10-CM | POA: Diagnosis not present

## 2015-02-13 DIAGNOSIS — K219 Gastro-esophageal reflux disease without esophagitis: Secondary | ICD-10-CM | POA: Diagnosis not present

## 2015-02-13 DIAGNOSIS — I10 Essential (primary) hypertension: Secondary | ICD-10-CM | POA: Diagnosis not present

## 2015-02-13 DIAGNOSIS — F419 Anxiety disorder, unspecified: Secondary | ICD-10-CM | POA: Diagnosis not present

## 2015-02-13 DIAGNOSIS — M4806 Spinal stenosis, lumbar region: Secondary | ICD-10-CM | POA: Diagnosis not present

## 2015-02-13 DIAGNOSIS — M5106 Intervertebral disc disorders with myelopathy, lumbar region: Secondary | ICD-10-CM | POA: Diagnosis not present

## 2015-02-13 DIAGNOSIS — K589 Irritable bowel syndrome without diarrhea: Secondary | ICD-10-CM | POA: Diagnosis not present

## 2015-02-13 DIAGNOSIS — Z4789 Encounter for other orthopedic aftercare: Secondary | ICD-10-CM | POA: Diagnosis not present

## 2015-02-13 DIAGNOSIS — I340 Nonrheumatic mitral (valve) insufficiency: Secondary | ICD-10-CM | POA: Diagnosis not present

## 2015-02-13 DIAGNOSIS — Z8673 Personal history of transient ischemic attack (TIA), and cerebral infarction without residual deficits: Secondary | ICD-10-CM | POA: Diagnosis not present

## 2015-02-13 DIAGNOSIS — M4804 Spinal stenosis, thoracic region: Secondary | ICD-10-CM | POA: Diagnosis not present

## 2015-02-13 DIAGNOSIS — E559 Vitamin D deficiency, unspecified: Secondary | ICD-10-CM | POA: Diagnosis not present

## 2015-02-15 DIAGNOSIS — M5106 Intervertebral disc disorders with myelopathy, lumbar region: Secondary | ICD-10-CM | POA: Diagnosis not present

## 2015-02-15 DIAGNOSIS — I10 Essential (primary) hypertension: Secondary | ICD-10-CM | POA: Diagnosis not present

## 2015-02-15 DIAGNOSIS — F419 Anxiety disorder, unspecified: Secondary | ICD-10-CM | POA: Diagnosis not present

## 2015-02-15 DIAGNOSIS — K219 Gastro-esophageal reflux disease without esophagitis: Secondary | ICD-10-CM | POA: Diagnosis not present

## 2015-02-15 DIAGNOSIS — M4804 Spinal stenosis, thoracic region: Secondary | ICD-10-CM | POA: Diagnosis not present

## 2015-02-15 DIAGNOSIS — I340 Nonrheumatic mitral (valve) insufficiency: Secondary | ICD-10-CM | POA: Diagnosis not present

## 2015-02-15 DIAGNOSIS — Z8673 Personal history of transient ischemic attack (TIA), and cerebral infarction without residual deficits: Secondary | ICD-10-CM | POA: Diagnosis not present

## 2015-02-15 DIAGNOSIS — M4806 Spinal stenosis, lumbar region: Secondary | ICD-10-CM | POA: Diagnosis not present

## 2015-02-15 DIAGNOSIS — Z4789 Encounter for other orthopedic aftercare: Secondary | ICD-10-CM | POA: Diagnosis not present

## 2015-02-15 DIAGNOSIS — K589 Irritable bowel syndrome without diarrhea: Secondary | ICD-10-CM | POA: Diagnosis not present

## 2015-02-15 DIAGNOSIS — E559 Vitamin D deficiency, unspecified: Secondary | ICD-10-CM | POA: Diagnosis not present

## 2015-02-16 DIAGNOSIS — M5106 Intervertebral disc disorders with myelopathy, lumbar region: Secondary | ICD-10-CM | POA: Diagnosis not present

## 2015-02-16 DIAGNOSIS — K219 Gastro-esophageal reflux disease without esophagitis: Secondary | ICD-10-CM | POA: Diagnosis not present

## 2015-02-16 DIAGNOSIS — M4804 Spinal stenosis, thoracic region: Secondary | ICD-10-CM | POA: Diagnosis not present

## 2015-02-16 DIAGNOSIS — F419 Anxiety disorder, unspecified: Secondary | ICD-10-CM | POA: Diagnosis not present

## 2015-02-16 DIAGNOSIS — I10 Essential (primary) hypertension: Secondary | ICD-10-CM | POA: Diagnosis not present

## 2015-02-16 DIAGNOSIS — M4806 Spinal stenosis, lumbar region: Secondary | ICD-10-CM | POA: Diagnosis not present

## 2015-02-16 DIAGNOSIS — E559 Vitamin D deficiency, unspecified: Secondary | ICD-10-CM | POA: Diagnosis not present

## 2015-02-16 DIAGNOSIS — Z4789 Encounter for other orthopedic aftercare: Secondary | ICD-10-CM | POA: Diagnosis not present

## 2015-02-16 DIAGNOSIS — I340 Nonrheumatic mitral (valve) insufficiency: Secondary | ICD-10-CM | POA: Diagnosis not present

## 2015-02-16 DIAGNOSIS — K589 Irritable bowel syndrome without diarrhea: Secondary | ICD-10-CM | POA: Diagnosis not present

## 2015-02-16 DIAGNOSIS — Z8673 Personal history of transient ischemic attack (TIA), and cerebral infarction without residual deficits: Secondary | ICD-10-CM | POA: Diagnosis not present

## 2015-02-16 NOTE — Progress Notes (Addendum)
Patient ID: Jodi Peters, female   DOB: 1929/09/15, 81 y.o.   MRN: [id number]                PROGRESS NOTE  DATE:  02/10/2015        FACILITY: [facility]           LEVEL OF CARE:   SNF   Acute Visit/Discharge Visit   CHIEF COMPLAINT:  Pre-discharge review.      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  She underwent stabilizing surgery by Dr. Ellene Route and she tolerated this procedure well.  She came here for rehabilitation.    When I came to see this lady on 01/04/2015 with the nurse, I was surprised to see that she was lethargic, taking me 5-10 minutes to wake her up.  After that, she had extreme left hemineglect, weakness of the left arm greater than left leg.  There was some suggestion that some of this started 24 hours before.  I sent her to the emergency room as a "Code Stroke".  However, by the time she got there and was seen, she had really made a remarkable recovery.  A CT scan of the head showed an area in the right frontal lobe.  We went on to do an MRI of the brain on 01/08/2015.  This showed a 2.7 cm acute/subacute infarct in the right posterior frontal white matter.  I had her seen by Neurology, I believe, with the question of whether this was a postictal phenomenon.  I did not actually ever see their note.  I continue to think that this lady simply was at higher risk for a much more disabling disaster or stroke than actually came to be, possibly because of her own intrinsic clot recannulization.       In any case, she has continued to do well.  Her blood pressures are marginally elevated in the 160s to 170s range.    She is going home with a rolling walker, PT, OT, and skilled nursing.  She has not needed any of her Ultram.  We will send her home with Tylenol.    From a back pain point of view, she has also done  very well and has no issues here.    CURRENT MEDICATIONS:  Medication list is reviewed.             Brimonidine and Cosopt ophthalmic.    Vitamin D2, 50,000 U monthly.    Lisinopril 40 q.d.       Lopid 600 q.d.     Norvasc 10 q.d.      Pilocarpine ophthalmic.    Systane ophthalmic.    Xalatan ophthalmic.    Xanax  tablet p.r.n. for anxiety, 0.25 mg.     Metoprolol 25 t.i.d.      Hydralazine 35 mg every 8 hours.    Plavix 75 mg daily.     PHYSICAL EXAMINATION:   VITAL SIGNS:     PULSE:  72.   BLOOD PRESSURE:  145/66 on 02/08/2015.    However, this has mostly been in the 170s to 180 range.   CHEST/RESPIRATORY:  Clear air entry bilaterally.    CARDIOVASCULAR:   CARDIAC:  Heart sounds are normal.   NEUROLOGICAL:    CRANIAL NERVES:  Visual fields are normal.  Extraocular movements normal.  Oral exam is normal.   MOTOR:  She may have mild left pronator drift.    BALANCE/GAIT:  She is able to bring herself to a standing position without a walker.  Marginal ability to get around.    ASSESSMENT/PLAN:                  Status post decompressive laminectomy for thoracic cord compression.  She has done well here.    Nondominant hemisphere stroke with mild left-sided symptoms.  However, I think this patient was initially exhibiting a much more impressive degree of right hemisphere ischemia.  She is on Plavix.     The patient will go home with a rolling walker.  She will have home health PT, OT, and skilled nursing.  There is a lot of family support here, in addition.  She will need a commode chair for nighttime use.

## 2015-02-17 DIAGNOSIS — M5106 Intervertebral disc disorders with myelopathy, lumbar region: Secondary | ICD-10-CM | POA: Diagnosis not present

## 2015-02-17 DIAGNOSIS — M4804 Spinal stenosis, thoracic region: Secondary | ICD-10-CM | POA: Diagnosis not present

## 2015-02-17 DIAGNOSIS — M159 Polyosteoarthritis, unspecified: Secondary | ICD-10-CM | POA: Diagnosis not present

## 2015-02-17 DIAGNOSIS — M4806 Spinal stenosis, lumbar region: Secondary | ICD-10-CM | POA: Diagnosis not present

## 2015-02-17 DIAGNOSIS — K219 Gastro-esophageal reflux disease without esophagitis: Secondary | ICD-10-CM | POA: Diagnosis not present

## 2015-02-17 DIAGNOSIS — F419 Anxiety disorder, unspecified: Secondary | ICD-10-CM | POA: Diagnosis not present

## 2015-02-17 DIAGNOSIS — I340 Nonrheumatic mitral (valve) insufficiency: Secondary | ICD-10-CM | POA: Diagnosis not present

## 2015-02-17 DIAGNOSIS — Z4789 Encounter for other orthopedic aftercare: Secondary | ICD-10-CM | POA: Diagnosis not present

## 2015-02-17 DIAGNOSIS — E559 Vitamin D deficiency, unspecified: Secondary | ICD-10-CM | POA: Diagnosis not present

## 2015-02-17 DIAGNOSIS — I69354 Hemiplegia and hemiparesis following cerebral infarction affecting left non-dominant side: Secondary | ICD-10-CM | POA: Diagnosis not present

## 2015-02-17 DIAGNOSIS — Z8673 Personal history of transient ischemic attack (TIA), and cerebral infarction without residual deficits: Secondary | ICD-10-CM | POA: Diagnosis not present

## 2015-02-17 DIAGNOSIS — I10 Essential (primary) hypertension: Secondary | ICD-10-CM | POA: Diagnosis not present

## 2015-02-17 DIAGNOSIS — K589 Irritable bowel syndrome without diarrhea: Secondary | ICD-10-CM | POA: Diagnosis not present

## 2015-02-18 DIAGNOSIS — I10 Essential (primary) hypertension: Secondary | ICD-10-CM | POA: Diagnosis not present

## 2015-02-18 DIAGNOSIS — Z4789 Encounter for other orthopedic aftercare: Secondary | ICD-10-CM | POA: Diagnosis not present

## 2015-02-18 DIAGNOSIS — F419 Anxiety disorder, unspecified: Secondary | ICD-10-CM | POA: Diagnosis not present

## 2015-02-18 DIAGNOSIS — M4806 Spinal stenosis, lumbar region: Secondary | ICD-10-CM | POA: Diagnosis not present

## 2015-02-18 DIAGNOSIS — E559 Vitamin D deficiency, unspecified: Secondary | ICD-10-CM | POA: Diagnosis not present

## 2015-02-18 DIAGNOSIS — K219 Gastro-esophageal reflux disease without esophagitis: Secondary | ICD-10-CM | POA: Diagnosis not present

## 2015-02-18 DIAGNOSIS — K589 Irritable bowel syndrome without diarrhea: Secondary | ICD-10-CM | POA: Diagnosis not present

## 2015-02-18 DIAGNOSIS — Z8673 Personal history of transient ischemic attack (TIA), and cerebral infarction without residual deficits: Secondary | ICD-10-CM | POA: Diagnosis not present

## 2015-02-18 DIAGNOSIS — M4804 Spinal stenosis, thoracic region: Secondary | ICD-10-CM | POA: Diagnosis not present

## 2015-02-18 DIAGNOSIS — M5106 Intervertebral disc disorders with myelopathy, lumbar region: Secondary | ICD-10-CM | POA: Diagnosis not present

## 2015-02-18 DIAGNOSIS — I340 Nonrheumatic mitral (valve) insufficiency: Secondary | ICD-10-CM | POA: Diagnosis not present

## 2015-02-19 DIAGNOSIS — I340 Nonrheumatic mitral (valve) insufficiency: Secondary | ICD-10-CM | POA: Diagnosis not present

## 2015-02-19 DIAGNOSIS — M4804 Spinal stenosis, thoracic region: Secondary | ICD-10-CM | POA: Diagnosis not present

## 2015-02-19 DIAGNOSIS — I10 Essential (primary) hypertension: Secondary | ICD-10-CM | POA: Diagnosis not present

## 2015-02-19 DIAGNOSIS — Z8673 Personal history of transient ischemic attack (TIA), and cerebral infarction without residual deficits: Secondary | ICD-10-CM | POA: Diagnosis not present

## 2015-02-19 DIAGNOSIS — M4806 Spinal stenosis, lumbar region: Secondary | ICD-10-CM | POA: Diagnosis not present

## 2015-02-19 DIAGNOSIS — Z4789 Encounter for other orthopedic aftercare: Secondary | ICD-10-CM | POA: Diagnosis not present

## 2015-02-19 DIAGNOSIS — F419 Anxiety disorder, unspecified: Secondary | ICD-10-CM | POA: Diagnosis not present

## 2015-02-19 DIAGNOSIS — K589 Irritable bowel syndrome without diarrhea: Secondary | ICD-10-CM | POA: Diagnosis not present

## 2015-02-19 DIAGNOSIS — K219 Gastro-esophageal reflux disease without esophagitis: Secondary | ICD-10-CM | POA: Diagnosis not present

## 2015-02-19 DIAGNOSIS — M5106 Intervertebral disc disorders with myelopathy, lumbar region: Secondary | ICD-10-CM | POA: Diagnosis not present

## 2015-02-19 DIAGNOSIS — E559 Vitamin D deficiency, unspecified: Secondary | ICD-10-CM | POA: Diagnosis not present

## 2015-02-22 DIAGNOSIS — F419 Anxiety disorder, unspecified: Secondary | ICD-10-CM | POA: Diagnosis not present

## 2015-02-22 DIAGNOSIS — K589 Irritable bowel syndrome without diarrhea: Secondary | ICD-10-CM | POA: Diagnosis not present

## 2015-02-22 DIAGNOSIS — M4804 Spinal stenosis, thoracic region: Secondary | ICD-10-CM | POA: Diagnosis not present

## 2015-02-22 DIAGNOSIS — E559 Vitamin D deficiency, unspecified: Secondary | ICD-10-CM | POA: Diagnosis not present

## 2015-02-22 DIAGNOSIS — M4806 Spinal stenosis, lumbar region: Secondary | ICD-10-CM | POA: Diagnosis not present

## 2015-02-22 DIAGNOSIS — M5106 Intervertebral disc disorders with myelopathy, lumbar region: Secondary | ICD-10-CM | POA: Diagnosis not present

## 2015-02-22 DIAGNOSIS — Z4789 Encounter for other orthopedic aftercare: Secondary | ICD-10-CM | POA: Diagnosis not present

## 2015-02-22 DIAGNOSIS — I340 Nonrheumatic mitral (valve) insufficiency: Secondary | ICD-10-CM | POA: Diagnosis not present

## 2015-02-22 DIAGNOSIS — K219 Gastro-esophageal reflux disease without esophagitis: Secondary | ICD-10-CM | POA: Diagnosis not present

## 2015-02-22 DIAGNOSIS — Z8673 Personal history of transient ischemic attack (TIA), and cerebral infarction without residual deficits: Secondary | ICD-10-CM | POA: Diagnosis not present

## 2015-02-22 DIAGNOSIS — I10 Essential (primary) hypertension: Secondary | ICD-10-CM | POA: Diagnosis not present

## 2015-02-23 ENCOUNTER — Other Ambulatory Visit (HOSPITAL_COMMUNITY)
Admission: RE | Admit: 2015-02-23 | Discharge: 2015-02-23 | Disposition: A | Discharge status: Home / Self Care | Payer: Medicare Other | Source: Other Acute Inpatient Hospital | Attending: Internal Medicine | Admitting: Internal Medicine

## 2015-02-23 DIAGNOSIS — M4804 Spinal stenosis, thoracic region: Secondary | ICD-10-CM | POA: Diagnosis not present

## 2015-02-23 DIAGNOSIS — M4806 Spinal stenosis, lumbar region: Secondary | ICD-10-CM | POA: Diagnosis not present

## 2015-02-23 DIAGNOSIS — Z8673 Personal history of transient ischemic attack (TIA), and cerebral infarction without residual deficits: Secondary | ICD-10-CM | POA: Diagnosis not present

## 2015-02-23 DIAGNOSIS — K589 Irritable bowel syndrome without diarrhea: Secondary | ICD-10-CM | POA: Diagnosis not present

## 2015-02-23 DIAGNOSIS — I340 Nonrheumatic mitral (valve) insufficiency: Secondary | ICD-10-CM | POA: Diagnosis not present

## 2015-02-23 DIAGNOSIS — Z4789 Encounter for other orthopedic aftercare: Secondary | ICD-10-CM | POA: Diagnosis not present

## 2015-02-23 DIAGNOSIS — I10 Essential (primary) hypertension: Secondary | ICD-10-CM | POA: Diagnosis not present

## 2015-02-23 DIAGNOSIS — M5106 Intervertebral disc disorders with myelopathy, lumbar region: Secondary | ICD-10-CM | POA: Diagnosis not present

## 2015-02-23 DIAGNOSIS — K219 Gastro-esophageal reflux disease without esophagitis: Secondary | ICD-10-CM | POA: Diagnosis not present

## 2015-02-23 DIAGNOSIS — F419 Anxiety disorder, unspecified: Secondary | ICD-10-CM | POA: Diagnosis not present

## 2015-02-23 DIAGNOSIS — E559 Vitamin D deficiency, unspecified: Secondary | ICD-10-CM | POA: Diagnosis not present

## 2015-02-23 LAB — LIPID PANEL
CHOLESTEROL: 184 mg/dL (ref 0–200)
HDL: 59 mg/dL (ref >40)
LDL CALC: 107 mg/dL — AB (ref 0–99)
TRIGLYCERIDES: 90 mg/dL (ref <150)
Total CHOL/HDL Ratio: 3.1 RATIO
VLDL: 18 mg/dL (ref 0–40)

## 2015-02-23 LAB — BASIC METABOLIC PANEL
ANION GAP: 7 (ref 5–15)
BUN: 17 mg/dL (ref 6–20)
CHLORIDE: 110 mmol/L (ref 101–111)
CO2: 24 mmol/L (ref 22–32)
Calcium: 9.8 mg/dL (ref 8.9–10.3)
Creatinine, Ser: 0.99 mg/dL (ref 0.44–1.00)
GFR calc non Af Amer: 51 mL/min — ABNORMAL LOW (ref >60)
GFR, EST AFRICAN AMERICAN: 59 mL/min — AB (ref >60)
Glucose, Bld: 132 mg/dL — ABNORMAL HIGH (ref 65–99)
Potassium: 3.8 mmol/L (ref 3.5–5.1)
Sodium: 141 mmol/L (ref 135–145)

## 2015-02-24 DIAGNOSIS — Z8673 Personal history of transient ischemic attack (TIA), and cerebral infarction without residual deficits: Secondary | ICD-10-CM | POA: Diagnosis not present

## 2015-02-24 DIAGNOSIS — K589 Irritable bowel syndrome without diarrhea: Secondary | ICD-10-CM | POA: Diagnosis not present

## 2015-02-24 DIAGNOSIS — E559 Vitamin D deficiency, unspecified: Secondary | ICD-10-CM | POA: Diagnosis not present

## 2015-02-24 DIAGNOSIS — K219 Gastro-esophageal reflux disease without esophagitis: Secondary | ICD-10-CM | POA: Diagnosis not present

## 2015-02-24 DIAGNOSIS — M4806 Spinal stenosis, lumbar region: Secondary | ICD-10-CM | POA: Diagnosis not present

## 2015-02-24 DIAGNOSIS — M4804 Spinal stenosis, thoracic region: Secondary | ICD-10-CM | POA: Diagnosis not present

## 2015-02-24 DIAGNOSIS — I10 Essential (primary) hypertension: Secondary | ICD-10-CM | POA: Diagnosis not present

## 2015-02-24 DIAGNOSIS — M5106 Intervertebral disc disorders with myelopathy, lumbar region: Secondary | ICD-10-CM | POA: Diagnosis not present

## 2015-02-24 DIAGNOSIS — Z4789 Encounter for other orthopedic aftercare: Secondary | ICD-10-CM | POA: Diagnosis not present

## 2015-02-24 DIAGNOSIS — I340 Nonrheumatic mitral (valve) insufficiency: Secondary | ICD-10-CM | POA: Diagnosis not present

## 2015-02-24 DIAGNOSIS — F419 Anxiety disorder, unspecified: Secondary | ICD-10-CM | POA: Diagnosis not present

## 2015-02-25 DIAGNOSIS — E559 Vitamin D deficiency, unspecified: Secondary | ICD-10-CM | POA: Diagnosis not present

## 2015-02-25 DIAGNOSIS — F419 Anxiety disorder, unspecified: Secondary | ICD-10-CM | POA: Diagnosis not present

## 2015-02-25 DIAGNOSIS — K589 Irritable bowel syndrome without diarrhea: Secondary | ICD-10-CM | POA: Diagnosis not present

## 2015-02-25 DIAGNOSIS — M5106 Intervertebral disc disorders with myelopathy, lumbar region: Secondary | ICD-10-CM | POA: Diagnosis not present

## 2015-02-25 DIAGNOSIS — M4806 Spinal stenosis, lumbar region: Secondary | ICD-10-CM | POA: Diagnosis not present

## 2015-02-25 DIAGNOSIS — M4804 Spinal stenosis, thoracic region: Secondary | ICD-10-CM | POA: Diagnosis not present

## 2015-02-25 DIAGNOSIS — I10 Essential (primary) hypertension: Secondary | ICD-10-CM | POA: Diagnosis not present

## 2015-02-25 DIAGNOSIS — Z8673 Personal history of transient ischemic attack (TIA), and cerebral infarction without residual deficits: Secondary | ICD-10-CM | POA: Diagnosis not present

## 2015-02-25 DIAGNOSIS — I340 Nonrheumatic mitral (valve) insufficiency: Secondary | ICD-10-CM | POA: Diagnosis not present

## 2015-02-25 DIAGNOSIS — K219 Gastro-esophageal reflux disease without esophagitis: Secondary | ICD-10-CM | POA: Diagnosis not present

## 2015-02-25 DIAGNOSIS — Z4789 Encounter for other orthopedic aftercare: Secondary | ICD-10-CM | POA: Diagnosis not present

## 2015-02-26 DIAGNOSIS — H25811 Combined forms of age-related cataract, right eye: Secondary | ICD-10-CM | POA: Diagnosis not present

## 2015-02-26 DIAGNOSIS — Z961 Presence of intraocular lens: Secondary | ICD-10-CM | POA: Diagnosis not present

## 2015-02-26 DIAGNOSIS — H4011X3 Primary open-angle glaucoma, severe stage: Secondary | ICD-10-CM | POA: Diagnosis not present

## 2015-02-28 DIAGNOSIS — M4806 Spinal stenosis, lumbar region: Secondary | ICD-10-CM | POA: Diagnosis not present

## 2015-02-28 DIAGNOSIS — Z8673 Personal history of transient ischemic attack (TIA), and cerebral infarction without residual deficits: Secondary | ICD-10-CM | POA: Diagnosis not present

## 2015-02-28 DIAGNOSIS — I10 Essential (primary) hypertension: Secondary | ICD-10-CM | POA: Diagnosis not present

## 2015-02-28 DIAGNOSIS — K589 Irritable bowel syndrome without diarrhea: Secondary | ICD-10-CM | POA: Diagnosis not present

## 2015-02-28 DIAGNOSIS — K219 Gastro-esophageal reflux disease without esophagitis: Secondary | ICD-10-CM | POA: Diagnosis not present

## 2015-02-28 DIAGNOSIS — I340 Nonrheumatic mitral (valve) insufficiency: Secondary | ICD-10-CM | POA: Diagnosis not present

## 2015-02-28 DIAGNOSIS — F419 Anxiety disorder, unspecified: Secondary | ICD-10-CM | POA: Diagnosis not present

## 2015-02-28 DIAGNOSIS — M4804 Spinal stenosis, thoracic region: Secondary | ICD-10-CM | POA: Diagnosis not present

## 2015-02-28 DIAGNOSIS — M5106 Intervertebral disc disorders with myelopathy, lumbar region: Secondary | ICD-10-CM | POA: Diagnosis not present

## 2015-02-28 DIAGNOSIS — E559 Vitamin D deficiency, unspecified: Secondary | ICD-10-CM | POA: Diagnosis not present

## 2015-02-28 DIAGNOSIS — Z4789 Encounter for other orthopedic aftercare: Secondary | ICD-10-CM | POA: Diagnosis not present

## 2015-03-01 DIAGNOSIS — I340 Nonrheumatic mitral (valve) insufficiency: Secondary | ICD-10-CM | POA: Diagnosis not present

## 2015-03-01 DIAGNOSIS — M5106 Intervertebral disc disorders with myelopathy, lumbar region: Secondary | ICD-10-CM | POA: Diagnosis not present

## 2015-03-01 DIAGNOSIS — Z4789 Encounter for other orthopedic aftercare: Secondary | ICD-10-CM | POA: Diagnosis not present

## 2015-03-01 DIAGNOSIS — K589 Irritable bowel syndrome without diarrhea: Secondary | ICD-10-CM | POA: Diagnosis not present

## 2015-03-01 DIAGNOSIS — E559 Vitamin D deficiency, unspecified: Secondary | ICD-10-CM | POA: Diagnosis not present

## 2015-03-01 DIAGNOSIS — M4806 Spinal stenosis, lumbar region: Secondary | ICD-10-CM | POA: Diagnosis not present

## 2015-03-01 DIAGNOSIS — M4804 Spinal stenosis, thoracic region: Secondary | ICD-10-CM | POA: Diagnosis not present

## 2015-03-01 DIAGNOSIS — F419 Anxiety disorder, unspecified: Secondary | ICD-10-CM | POA: Diagnosis not present

## 2015-03-01 DIAGNOSIS — I10 Essential (primary) hypertension: Secondary | ICD-10-CM | POA: Diagnosis not present

## 2015-03-01 DIAGNOSIS — K219 Gastro-esophageal reflux disease without esophagitis: Secondary | ICD-10-CM | POA: Diagnosis not present

## 2015-03-01 DIAGNOSIS — Z8673 Personal history of transient ischemic attack (TIA), and cerebral infarction without residual deficits: Secondary | ICD-10-CM | POA: Diagnosis not present

## 2015-03-02 DIAGNOSIS — I340 Nonrheumatic mitral (valve) insufficiency: Secondary | ICD-10-CM | POA: Diagnosis not present

## 2015-03-02 DIAGNOSIS — M4804 Spinal stenosis, thoracic region: Secondary | ICD-10-CM | POA: Diagnosis not present

## 2015-03-02 DIAGNOSIS — Z4789 Encounter for other orthopedic aftercare: Secondary | ICD-10-CM | POA: Diagnosis not present

## 2015-03-02 DIAGNOSIS — M4806 Spinal stenosis, lumbar region: Secondary | ICD-10-CM | POA: Diagnosis not present

## 2015-03-02 DIAGNOSIS — K219 Gastro-esophageal reflux disease without esophagitis: Secondary | ICD-10-CM | POA: Diagnosis not present

## 2015-03-02 DIAGNOSIS — F419 Anxiety disorder, unspecified: Secondary | ICD-10-CM | POA: Diagnosis not present

## 2015-03-02 DIAGNOSIS — M5106 Intervertebral disc disorders with myelopathy, lumbar region: Secondary | ICD-10-CM | POA: Diagnosis not present

## 2015-03-02 DIAGNOSIS — I10 Essential (primary) hypertension: Secondary | ICD-10-CM | POA: Diagnosis not present

## 2015-03-02 DIAGNOSIS — Z8673 Personal history of transient ischemic attack (TIA), and cerebral infarction without residual deficits: Secondary | ICD-10-CM | POA: Diagnosis not present

## 2015-03-02 DIAGNOSIS — E559 Vitamin D deficiency, unspecified: Secondary | ICD-10-CM | POA: Diagnosis not present

## 2015-03-02 DIAGNOSIS — K589 Irritable bowel syndrome without diarrhea: Secondary | ICD-10-CM | POA: Diagnosis not present

## 2015-03-03 DIAGNOSIS — M5106 Intervertebral disc disorders with myelopathy, lumbar region: Secondary | ICD-10-CM | POA: Diagnosis not present

## 2015-03-03 DIAGNOSIS — F419 Anxiety disorder, unspecified: Secondary | ICD-10-CM | POA: Diagnosis not present

## 2015-03-03 DIAGNOSIS — I340 Nonrheumatic mitral (valve) insufficiency: Secondary | ICD-10-CM | POA: Diagnosis not present

## 2015-03-03 DIAGNOSIS — Z4789 Encounter for other orthopedic aftercare: Secondary | ICD-10-CM | POA: Diagnosis not present

## 2015-03-03 DIAGNOSIS — I10 Essential (primary) hypertension: Secondary | ICD-10-CM | POA: Diagnosis not present

## 2015-03-03 DIAGNOSIS — M4806 Spinal stenosis, lumbar region: Secondary | ICD-10-CM | POA: Diagnosis not present

## 2015-03-03 DIAGNOSIS — E559 Vitamin D deficiency, unspecified: Secondary | ICD-10-CM | POA: Diagnosis not present

## 2015-03-03 DIAGNOSIS — K589 Irritable bowel syndrome without diarrhea: Secondary | ICD-10-CM | POA: Diagnosis not present

## 2015-03-03 DIAGNOSIS — Z8673 Personal history of transient ischemic attack (TIA), and cerebral infarction without residual deficits: Secondary | ICD-10-CM | POA: Diagnosis not present

## 2015-03-03 DIAGNOSIS — M4804 Spinal stenosis, thoracic region: Secondary | ICD-10-CM | POA: Diagnosis not present

## 2015-03-03 DIAGNOSIS — K219 Gastro-esophageal reflux disease without esophagitis: Secondary | ICD-10-CM | POA: Diagnosis not present

## 2015-03-05 DIAGNOSIS — M5106 Intervertebral disc disorders with myelopathy, lumbar region: Secondary | ICD-10-CM | POA: Diagnosis not present

## 2015-03-05 DIAGNOSIS — K219 Gastro-esophageal reflux disease without esophagitis: Secondary | ICD-10-CM | POA: Diagnosis not present

## 2015-03-05 DIAGNOSIS — M4804 Spinal stenosis, thoracic region: Secondary | ICD-10-CM | POA: Diagnosis not present

## 2015-03-05 DIAGNOSIS — M4806 Spinal stenosis, lumbar region: Secondary | ICD-10-CM | POA: Diagnosis not present

## 2015-03-05 DIAGNOSIS — I10 Essential (primary) hypertension: Secondary | ICD-10-CM | POA: Diagnosis not present

## 2015-03-05 DIAGNOSIS — Z4789 Encounter for other orthopedic aftercare: Secondary | ICD-10-CM | POA: Diagnosis not present

## 2015-03-05 DIAGNOSIS — K589 Irritable bowel syndrome without diarrhea: Secondary | ICD-10-CM | POA: Diagnosis not present

## 2015-03-05 DIAGNOSIS — E559 Vitamin D deficiency, unspecified: Secondary | ICD-10-CM | POA: Diagnosis not present

## 2015-03-05 DIAGNOSIS — F419 Anxiety disorder, unspecified: Secondary | ICD-10-CM | POA: Diagnosis not present

## 2015-03-05 DIAGNOSIS — I340 Nonrheumatic mitral (valve) insufficiency: Secondary | ICD-10-CM | POA: Diagnosis not present

## 2015-03-05 DIAGNOSIS — Z8673 Personal history of transient ischemic attack (TIA), and cerebral infarction without residual deficits: Secondary | ICD-10-CM | POA: Diagnosis not present

## 2015-03-08 DIAGNOSIS — M5106 Intervertebral disc disorders with myelopathy, lumbar region: Secondary | ICD-10-CM | POA: Diagnosis not present

## 2015-03-08 DIAGNOSIS — I10 Essential (primary) hypertension: Secondary | ICD-10-CM | POA: Diagnosis not present

## 2015-03-08 DIAGNOSIS — Z4789 Encounter for other orthopedic aftercare: Secondary | ICD-10-CM | POA: Diagnosis not present

## 2015-03-08 DIAGNOSIS — E559 Vitamin D deficiency, unspecified: Secondary | ICD-10-CM | POA: Diagnosis not present

## 2015-03-08 DIAGNOSIS — F419 Anxiety disorder, unspecified: Secondary | ICD-10-CM | POA: Diagnosis not present

## 2015-03-08 DIAGNOSIS — M4804 Spinal stenosis, thoracic region: Secondary | ICD-10-CM | POA: Diagnosis not present

## 2015-03-08 DIAGNOSIS — K219 Gastro-esophageal reflux disease without esophagitis: Secondary | ICD-10-CM | POA: Diagnosis not present

## 2015-03-08 DIAGNOSIS — Z8673 Personal history of transient ischemic attack (TIA), and cerebral infarction without residual deficits: Secondary | ICD-10-CM | POA: Diagnosis not present

## 2015-03-08 DIAGNOSIS — K589 Irritable bowel syndrome without diarrhea: Secondary | ICD-10-CM | POA: Diagnosis not present

## 2015-03-08 DIAGNOSIS — I340 Nonrheumatic mitral (valve) insufficiency: Secondary | ICD-10-CM | POA: Diagnosis not present

## 2015-03-08 DIAGNOSIS — M4806 Spinal stenosis, lumbar region: Secondary | ICD-10-CM | POA: Diagnosis not present

## 2015-03-09 DIAGNOSIS — Z8673 Personal history of transient ischemic attack (TIA), and cerebral infarction without residual deficits: Secondary | ICD-10-CM | POA: Diagnosis not present

## 2015-03-09 DIAGNOSIS — K589 Irritable bowel syndrome without diarrhea: Secondary | ICD-10-CM | POA: Diagnosis not present

## 2015-03-09 DIAGNOSIS — I340 Nonrheumatic mitral (valve) insufficiency: Secondary | ICD-10-CM | POA: Diagnosis not present

## 2015-03-09 DIAGNOSIS — F419 Anxiety disorder, unspecified: Secondary | ICD-10-CM | POA: Diagnosis not present

## 2015-03-09 DIAGNOSIS — M4804 Spinal stenosis, thoracic region: Secondary | ICD-10-CM | POA: Diagnosis not present

## 2015-03-09 DIAGNOSIS — I10 Essential (primary) hypertension: Secondary | ICD-10-CM | POA: Diagnosis not present

## 2015-03-09 DIAGNOSIS — M5106 Intervertebral disc disorders with myelopathy, lumbar region: Secondary | ICD-10-CM | POA: Diagnosis not present

## 2015-03-09 DIAGNOSIS — E559 Vitamin D deficiency, unspecified: Secondary | ICD-10-CM | POA: Diagnosis not present

## 2015-03-09 DIAGNOSIS — M4806 Spinal stenosis, lumbar region: Secondary | ICD-10-CM | POA: Diagnosis not present

## 2015-03-09 DIAGNOSIS — Z4789 Encounter for other orthopedic aftercare: Secondary | ICD-10-CM | POA: Diagnosis not present

## 2015-03-09 DIAGNOSIS — K219 Gastro-esophageal reflux disease without esophagitis: Secondary | ICD-10-CM | POA: Diagnosis not present

## 2015-03-10 DIAGNOSIS — Z4789 Encounter for other orthopedic aftercare: Secondary | ICD-10-CM | POA: Diagnosis not present

## 2015-03-10 DIAGNOSIS — F419 Anxiety disorder, unspecified: Secondary | ICD-10-CM | POA: Diagnosis not present

## 2015-03-10 DIAGNOSIS — I340 Nonrheumatic mitral (valve) insufficiency: Secondary | ICD-10-CM | POA: Diagnosis not present

## 2015-03-10 DIAGNOSIS — K589 Irritable bowel syndrome without diarrhea: Secondary | ICD-10-CM | POA: Diagnosis not present

## 2015-03-10 DIAGNOSIS — M4804 Spinal stenosis, thoracic region: Secondary | ICD-10-CM | POA: Diagnosis not present

## 2015-03-10 DIAGNOSIS — M5106 Intervertebral disc disorders with myelopathy, lumbar region: Secondary | ICD-10-CM | POA: Diagnosis not present

## 2015-03-10 DIAGNOSIS — K219 Gastro-esophageal reflux disease without esophagitis: Secondary | ICD-10-CM | POA: Diagnosis not present

## 2015-03-10 DIAGNOSIS — M4806 Spinal stenosis, lumbar region: Secondary | ICD-10-CM | POA: Diagnosis not present

## 2015-03-10 DIAGNOSIS — I10 Essential (primary) hypertension: Secondary | ICD-10-CM | POA: Diagnosis not present

## 2015-03-10 DIAGNOSIS — E559 Vitamin D deficiency, unspecified: Secondary | ICD-10-CM | POA: Diagnosis not present

## 2015-03-10 DIAGNOSIS — Z8673 Personal history of transient ischemic attack (TIA), and cerebral infarction without residual deficits: Secondary | ICD-10-CM | POA: Diagnosis not present

## 2015-03-11 DIAGNOSIS — F419 Anxiety disorder, unspecified: Secondary | ICD-10-CM | POA: Diagnosis not present

## 2015-03-11 DIAGNOSIS — E559 Vitamin D deficiency, unspecified: Secondary | ICD-10-CM | POA: Diagnosis not present

## 2015-03-11 DIAGNOSIS — M4804 Spinal stenosis, thoracic region: Secondary | ICD-10-CM | POA: Diagnosis not present

## 2015-03-11 DIAGNOSIS — M4806 Spinal stenosis, lumbar region: Secondary | ICD-10-CM | POA: Diagnosis not present

## 2015-03-11 DIAGNOSIS — I340 Nonrheumatic mitral (valve) insufficiency: Secondary | ICD-10-CM | POA: Diagnosis not present

## 2015-03-11 DIAGNOSIS — K219 Gastro-esophageal reflux disease without esophagitis: Secondary | ICD-10-CM | POA: Diagnosis not present

## 2015-03-11 DIAGNOSIS — M5106 Intervertebral disc disorders with myelopathy, lumbar region: Secondary | ICD-10-CM | POA: Diagnosis not present

## 2015-03-11 DIAGNOSIS — I10 Essential (primary) hypertension: Secondary | ICD-10-CM | POA: Diagnosis not present

## 2015-03-11 DIAGNOSIS — Z8673 Personal history of transient ischemic attack (TIA), and cerebral infarction without residual deficits: Secondary | ICD-10-CM | POA: Diagnosis not present

## 2015-03-11 DIAGNOSIS — K589 Irritable bowel syndrome without diarrhea: Secondary | ICD-10-CM | POA: Diagnosis not present

## 2015-03-11 DIAGNOSIS — Z4789 Encounter for other orthopedic aftercare: Secondary | ICD-10-CM | POA: Diagnosis not present

## 2015-03-15 DIAGNOSIS — I340 Nonrheumatic mitral (valve) insufficiency: Secondary | ICD-10-CM | POA: Diagnosis not present

## 2015-03-15 DIAGNOSIS — M4804 Spinal stenosis, thoracic region: Secondary | ICD-10-CM | POA: Diagnosis not present

## 2015-03-15 DIAGNOSIS — M4806 Spinal stenosis, lumbar region: Secondary | ICD-10-CM | POA: Diagnosis not present

## 2015-03-15 DIAGNOSIS — Z8673 Personal history of transient ischemic attack (TIA), and cerebral infarction without residual deficits: Secondary | ICD-10-CM | POA: Diagnosis not present

## 2015-03-15 DIAGNOSIS — F419 Anxiety disorder, unspecified: Secondary | ICD-10-CM | POA: Diagnosis not present

## 2015-03-15 DIAGNOSIS — K589 Irritable bowel syndrome without diarrhea: Secondary | ICD-10-CM | POA: Diagnosis not present

## 2015-03-15 DIAGNOSIS — E559 Vitamin D deficiency, unspecified: Secondary | ICD-10-CM | POA: Diagnosis not present

## 2015-03-15 DIAGNOSIS — Z4789 Encounter for other orthopedic aftercare: Secondary | ICD-10-CM | POA: Diagnosis not present

## 2015-03-15 DIAGNOSIS — M5106 Intervertebral disc disorders with myelopathy, lumbar region: Secondary | ICD-10-CM | POA: Diagnosis not present

## 2015-03-15 DIAGNOSIS — I10 Essential (primary) hypertension: Secondary | ICD-10-CM | POA: Diagnosis not present

## 2015-03-15 DIAGNOSIS — K219 Gastro-esophageal reflux disease without esophagitis: Secondary | ICD-10-CM | POA: Diagnosis not present

## 2015-03-18 DIAGNOSIS — K589 Irritable bowel syndrome without diarrhea: Secondary | ICD-10-CM | POA: Diagnosis not present

## 2015-03-18 DIAGNOSIS — I10 Essential (primary) hypertension: Secondary | ICD-10-CM | POA: Diagnosis not present

## 2015-03-18 DIAGNOSIS — G9520 Unspecified cord compression: Secondary | ICD-10-CM | POA: Diagnosis not present

## 2015-03-18 DIAGNOSIS — Z23 Encounter for immunization: Secondary | ICD-10-CM | POA: Diagnosis not present

## 2015-03-18 DIAGNOSIS — M4800 Spinal stenosis, site unspecified: Secondary | ICD-10-CM | POA: Diagnosis not present

## 2015-03-23 DIAGNOSIS — Z8673 Personal history of transient ischemic attack (TIA), and cerebral infarction without residual deficits: Secondary | ICD-10-CM | POA: Diagnosis not present

## 2015-03-23 DIAGNOSIS — I10 Essential (primary) hypertension: Secondary | ICD-10-CM | POA: Diagnosis not present

## 2015-03-23 DIAGNOSIS — F419 Anxiety disorder, unspecified: Secondary | ICD-10-CM | POA: Diagnosis not present

## 2015-03-23 DIAGNOSIS — M4804 Spinal stenosis, thoracic region: Secondary | ICD-10-CM | POA: Diagnosis not present

## 2015-03-23 DIAGNOSIS — E559 Vitamin D deficiency, unspecified: Secondary | ICD-10-CM | POA: Diagnosis not present

## 2015-03-23 DIAGNOSIS — I340 Nonrheumatic mitral (valve) insufficiency: Secondary | ICD-10-CM | POA: Diagnosis not present

## 2015-03-23 DIAGNOSIS — Z4789 Encounter for other orthopedic aftercare: Secondary | ICD-10-CM | POA: Diagnosis not present

## 2015-03-23 DIAGNOSIS — K219 Gastro-esophageal reflux disease without esophagitis: Secondary | ICD-10-CM | POA: Diagnosis not present

## 2015-03-23 DIAGNOSIS — M5106 Intervertebral disc disorders with myelopathy, lumbar region: Secondary | ICD-10-CM | POA: Diagnosis not present

## 2015-03-23 DIAGNOSIS — K589 Irritable bowel syndrome without diarrhea: Secondary | ICD-10-CM | POA: Diagnosis not present

## 2015-03-23 DIAGNOSIS — M4806 Spinal stenosis, lumbar region: Secondary | ICD-10-CM | POA: Diagnosis not present

## 2015-03-31 DIAGNOSIS — M4806 Spinal stenosis, lumbar region: Secondary | ICD-10-CM | POA: Diagnosis not present

## 2015-03-31 DIAGNOSIS — M5106 Intervertebral disc disorders with myelopathy, lumbar region: Secondary | ICD-10-CM | POA: Diagnosis not present

## 2015-03-31 DIAGNOSIS — I340 Nonrheumatic mitral (valve) insufficiency: Secondary | ICD-10-CM | POA: Diagnosis not present

## 2015-03-31 DIAGNOSIS — K589 Irritable bowel syndrome without diarrhea: Secondary | ICD-10-CM | POA: Diagnosis not present

## 2015-03-31 DIAGNOSIS — M4804 Spinal stenosis, thoracic region: Secondary | ICD-10-CM | POA: Diagnosis not present

## 2015-03-31 DIAGNOSIS — K219 Gastro-esophageal reflux disease without esophagitis: Secondary | ICD-10-CM | POA: Diagnosis not present

## 2015-03-31 DIAGNOSIS — Z4789 Encounter for other orthopedic aftercare: Secondary | ICD-10-CM | POA: Diagnosis not present

## 2015-03-31 DIAGNOSIS — E559 Vitamin D deficiency, unspecified: Secondary | ICD-10-CM | POA: Diagnosis not present

## 2015-03-31 DIAGNOSIS — F419 Anxiety disorder, unspecified: Secondary | ICD-10-CM | POA: Diagnosis not present

## 2015-03-31 DIAGNOSIS — Z8673 Personal history of transient ischemic attack (TIA), and cerebral infarction without residual deficits: Secondary | ICD-10-CM | POA: Diagnosis not present

## 2015-03-31 DIAGNOSIS — I10 Essential (primary) hypertension: Secondary | ICD-10-CM | POA: Diagnosis not present

## 2015-04-09 DIAGNOSIS — Z4789 Encounter for other orthopedic aftercare: Secondary | ICD-10-CM | POA: Diagnosis not present

## 2015-04-09 DIAGNOSIS — I10 Essential (primary) hypertension: Secondary | ICD-10-CM | POA: Diagnosis not present

## 2015-04-09 DIAGNOSIS — Z8673 Personal history of transient ischemic attack (TIA), and cerebral infarction without residual deficits: Secondary | ICD-10-CM | POA: Diagnosis not present

## 2015-04-09 DIAGNOSIS — I340 Nonrheumatic mitral (valve) insufficiency: Secondary | ICD-10-CM | POA: Diagnosis not present

## 2015-04-09 DIAGNOSIS — M4806 Spinal stenosis, lumbar region: Secondary | ICD-10-CM | POA: Diagnosis not present

## 2015-04-09 DIAGNOSIS — E559 Vitamin D deficiency, unspecified: Secondary | ICD-10-CM | POA: Diagnosis not present

## 2015-04-09 DIAGNOSIS — F419 Anxiety disorder, unspecified: Secondary | ICD-10-CM | POA: Diagnosis not present

## 2015-04-09 DIAGNOSIS — M4804 Spinal stenosis, thoracic region: Secondary | ICD-10-CM | POA: Diagnosis not present

## 2015-04-09 DIAGNOSIS — M5106 Intervertebral disc disorders with myelopathy, lumbar region: Secondary | ICD-10-CM | POA: Diagnosis not present

## 2015-04-09 DIAGNOSIS — K219 Gastro-esophageal reflux disease without esophagitis: Secondary | ICD-10-CM | POA: Diagnosis not present

## 2015-04-09 DIAGNOSIS — K589 Irritable bowel syndrome without diarrhea: Secondary | ICD-10-CM | POA: Diagnosis not present

## 2015-04-30 DIAGNOSIS — H25811 Combined forms of age-related cataract, right eye: Secondary | ICD-10-CM | POA: Diagnosis not present

## 2015-04-30 DIAGNOSIS — Z961 Presence of intraocular lens: Secondary | ICD-10-CM | POA: Diagnosis not present

## 2015-04-30 DIAGNOSIS — H401133 Primary open-angle glaucoma, bilateral, severe stage: Secondary | ICD-10-CM | POA: Diagnosis not present

## 2015-06-17 DIAGNOSIS — I10 Essential (primary) hypertension: Secondary | ICD-10-CM | POA: Diagnosis not present

## 2015-06-17 DIAGNOSIS — G9529 Other cord compression: Secondary | ICD-10-CM | POA: Diagnosis not present

## 2015-06-17 DIAGNOSIS — I951 Orthostatic hypotension: Secondary | ICD-10-CM | POA: Diagnosis not present

## 2015-06-21 ENCOUNTER — Encounter (HOSPITAL_COMMUNITY): Payer: Self-pay | Admitting: Emergency Medicine

## 2015-06-21 ENCOUNTER — Observation Stay (HOSPITAL_COMMUNITY)
Admission: EM | Admit: 2015-06-21 | Discharge: 2015-06-23 | Disposition: A | Discharge status: Home / Self Care | Payer: Medicare Other | Attending: Internal Medicine | Admitting: Internal Medicine

## 2015-06-21 ENCOUNTER — Emergency Department (HOSPITAL_COMMUNITY): Payer: Medicare Other

## 2015-06-21 DIAGNOSIS — R42 Dizziness and giddiness: Secondary | ICD-10-CM | POA: Diagnosis present

## 2015-06-21 DIAGNOSIS — I340 Nonrheumatic mitral (valve) insufficiency: Secondary | ICD-10-CM | POA: Insufficient documentation

## 2015-06-21 DIAGNOSIS — N816 Rectocele: Secondary | ICD-10-CM | POA: Diagnosis not present

## 2015-06-21 DIAGNOSIS — I679 Cerebrovascular disease, unspecified: Secondary | ICD-10-CM | POA: Diagnosis present

## 2015-06-21 DIAGNOSIS — H409 Unspecified glaucoma: Secondary | ICD-10-CM | POA: Diagnosis not present

## 2015-06-21 DIAGNOSIS — E785 Hyperlipidemia, unspecified: Secondary | ICD-10-CM | POA: Diagnosis not present

## 2015-06-21 DIAGNOSIS — R001 Bradycardia, unspecified: Principal | ICD-10-CM | POA: Insufficient documentation

## 2015-06-21 DIAGNOSIS — F419 Anxiety disorder, unspecified: Secondary | ICD-10-CM | POA: Insufficient documentation

## 2015-06-21 DIAGNOSIS — R55 Syncope and collapse: Secondary | ICD-10-CM | POA: Diagnosis not present

## 2015-06-21 DIAGNOSIS — M6281 Muscle weakness (generalized): Secondary | ICD-10-CM | POA: Insufficient documentation

## 2015-06-21 DIAGNOSIS — Z79899 Other long term (current) drug therapy: Secondary | ICD-10-CM | POA: Insufficient documentation

## 2015-06-21 DIAGNOSIS — N179 Acute kidney failure, unspecified: Secondary | ICD-10-CM | POA: Diagnosis not present

## 2015-06-21 DIAGNOSIS — M1990 Unspecified osteoarthritis, unspecified site: Secondary | ICD-10-CM | POA: Insufficient documentation

## 2015-06-21 DIAGNOSIS — Z853 Personal history of malignant neoplasm of breast: Secondary | ICD-10-CM | POA: Insufficient documentation

## 2015-06-21 DIAGNOSIS — H269 Unspecified cataract: Secondary | ICD-10-CM | POA: Diagnosis not present

## 2015-06-21 DIAGNOSIS — N63 Unspecified lump in breast: Secondary | ICD-10-CM | POA: Insufficient documentation

## 2015-06-21 DIAGNOSIS — Z880 Allergy status to penicillin: Secondary | ICD-10-CM | POA: Diagnosis not present

## 2015-06-21 DIAGNOSIS — J309 Allergic rhinitis, unspecified: Secondary | ICD-10-CM | POA: Diagnosis not present

## 2015-06-21 DIAGNOSIS — Z872 Personal history of diseases of the skin and subcutaneous tissue: Secondary | ICD-10-CM | POA: Diagnosis not present

## 2015-06-21 DIAGNOSIS — I6529 Occlusion and stenosis of unspecified carotid artery: Secondary | ICD-10-CM | POA: Diagnosis not present

## 2015-06-21 DIAGNOSIS — N644 Mastodynia: Secondary | ICD-10-CM | POA: Insufficient documentation

## 2015-06-21 DIAGNOSIS — R531 Weakness: Secondary | ICD-10-CM | POA: Diagnosis not present

## 2015-06-21 DIAGNOSIS — K5900 Constipation, unspecified: Secondary | ICD-10-CM | POA: Insufficient documentation

## 2015-06-21 DIAGNOSIS — G5600 Carpal tunnel syndrome, unspecified upper limb: Secondary | ICD-10-CM | POA: Insufficient documentation

## 2015-06-21 DIAGNOSIS — I10 Essential (primary) hypertension: Secondary | ICD-10-CM | POA: Diagnosis not present

## 2015-06-21 DIAGNOSIS — N8110 Cystocele, unspecified: Secondary | ICD-10-CM | POA: Insufficient documentation

## 2015-06-21 LAB — URINALYSIS, ROUTINE W REFLEX MICROSCOPIC
GLUCOSE, UA: NEGATIVE mg/dL
HGB URINE DIPSTICK: NEGATIVE
KETONES UR: 15 mg/dL — AB
NITRITE: NEGATIVE
PH: 5.5 (ref 5.0–8.0)
Protein, ur: 100 mg/dL — AB
Specific Gravity, Urine: 1.025 (ref 1.005–1.030)

## 2015-06-21 LAB — DIFFERENTIAL
Basophils Absolute: 0.1 10*3/uL (ref 0.0–0.1)
Basophils Relative: 1 %
EOS PCT: 1 %
Eosinophils Absolute: 0.1 10*3/uL (ref 0.0–0.7)
LYMPHS ABS: 2.4 10*3/uL (ref 0.7–4.0)
LYMPHS PCT: 32 %
MONO ABS: 0.5 10*3/uL (ref 0.1–1.0)
Monocytes Relative: 6 %
NEUTROS ABS: 4.6 10*3/uL (ref 1.7–7.7)
Neutrophils Relative %: 60 %

## 2015-06-21 LAB — CBC
HEMATOCRIT: 39.3 % (ref 36.0–46.0)
Hemoglobin: 12.4 g/dL (ref 12.0–15.0)
MCH: 28.5 pg (ref 26.0–34.0)
MCHC: 31.6 g/dL (ref 30.0–36.0)
MCV: 90.3 fL (ref 78.0–100.0)
Platelets: 336 10*3/uL (ref 150–400)
RBC: 4.35 MIL/uL (ref 3.87–5.11)
RDW: 14.6 % (ref 11.5–15.5)
WBC: 8 10*3/uL (ref 4.0–10.5)

## 2015-06-21 LAB — BASIC METABOLIC PANEL
Anion gap: 8 (ref 5–15)
BUN: 33 mg/dL — AB (ref 6–20)
CO2: 27 mmol/L (ref 22–32)
Calcium: 10.4 mg/dL — ABNORMAL HIGH (ref 8.9–10.3)
Chloride: 112 mmol/L — ABNORMAL HIGH (ref 101–111)
Creatinine, Ser: 1.54 mg/dL — ABNORMAL HIGH (ref 0.44–1.00)
GFR calc Af Amer: 35 mL/min — ABNORMAL LOW (ref >60)
GFR, EST NON AFRICAN AMERICAN: 30 mL/min — AB (ref >60)
GLUCOSE: 149 mg/dL — AB (ref 65–99)
POTASSIUM: 4.5 mmol/L (ref 3.5–5.1)
Sodium: 147 mmol/L — ABNORMAL HIGH (ref 135–145)

## 2015-06-21 LAB — TROPONIN I

## 2015-06-21 LAB — MAGNESIUM: Magnesium: 2.8 mg/dL — ABNORMAL HIGH (ref 1.7–2.4)

## 2015-06-21 LAB — URINE MICROSCOPIC-ADD ON: RBC / HPF: NONE SEEN RBC/hpf (ref 0–5)

## 2015-06-21 LAB — I-STAT TROPONIN, ED: TROPONIN I, POC: 0.02 ng/mL (ref 0.00–0.08)

## 2015-06-21 LAB — CBG MONITORING, ED: Glucose-Capillary: 137 mg/dL — ABNORMAL HIGH (ref 65–99)

## 2015-06-21 MED ORDER — HYDRALAZINE HCL 25 MG PO TABS
25.0000 mg | ORAL_TABLET | Freq: Two times a day (BID) | ORAL | Status: DC
  Administered 2015-06-21 – 2015-06-22 (×3): 25 mg via ORAL
  Filled 2015-06-21 (×4): qty 1

## 2015-06-21 MED ORDER — ENOXAPARIN SODIUM 30 MG/0.3ML ~~LOC~~ SOLN
30.0000 mg | SUBCUTANEOUS | Status: DC
  Administered 2015-06-21: 30 mg via SUBCUTANEOUS
  Filled 2015-06-21: qty 0.3

## 2015-06-21 MED ORDER — SODIUM CHLORIDE 0.9 % IV BOLUS (SEPSIS)
500.0000 mL | Freq: Once | INTRAVENOUS | Status: AC
  Administered 2015-06-21: 500 mL via INTRAVENOUS

## 2015-06-21 MED ORDER — ONDANSETRON HCL 4 MG PO TABS: 4.0000 mg | ORAL_TABLET | Freq: Four times a day (QID) | ORAL | Status: DC | PRN

## 2015-06-21 MED ORDER — ACETAMINOPHEN 500 MG PO TABS: 500.0000 mg | ORAL_TABLET | Freq: Four times a day (QID) | ORAL | Status: DC | PRN

## 2015-06-21 MED ORDER — ALPRAZOLAM 0.25 MG PO TABS: 0.1250 mg | ORAL_TABLET | Freq: Two times a day (BID) | ORAL | Status: DC | PRN

## 2015-06-21 MED ORDER — HYDROCODONE-ACETAMINOPHEN 5-325 MG PO TABS
1.0000 | ORAL_TABLET | ORAL | Status: DC | PRN
  Administered 2015-06-23: 1 via ORAL
  Filled 2015-06-21: qty 1

## 2015-06-21 MED ORDER — ONDANSETRON HCL 4 MG/2ML IJ SOLN: 4.0000 mg | Freq: Four times a day (QID) | INTRAMUSCULAR | Status: DC | PRN

## 2015-06-21 MED ORDER — MIRTAZAPINE 15 MG PO TABS
15.0000 mg | ORAL_TABLET | Freq: Every day | ORAL | Status: DC
  Administered 2015-06-21 – 2015-06-22 (×2): 15 mg via ORAL
  Filled 2015-06-21 (×2): qty 1

## 2015-06-21 MED ORDER — ENSURE ENLIVE PO LIQD: 237.0000 mL | Freq: Two times a day (BID) | ORAL | Status: DC

## 2015-06-21 MED ORDER — MECLIZINE HCL 12.5 MG PO TABS: 12.5000 mg | ORAL_TABLET | Freq: Three times a day (TID) | ORAL | Status: DC | PRN

## 2015-06-21 MED ORDER — VITAMIN D (ERGOCALCIFEROL) 1.25 MG (50000 UNIT) PO CAPS
50000.0000 [IU] | ORAL_CAPSULE | ORAL | Status: DC
  Filled 2015-06-21: qty 1

## 2015-06-21 MED ORDER — ONDANSETRON HCL 4 MG/2ML IJ SOLN
4.0000 mg | Freq: Once | INTRAMUSCULAR | Status: AC
  Administered 2015-06-21: 4 mg via INTRAVENOUS
  Filled 2015-06-21: qty 2

## 2015-06-21 MED ORDER — PILOCARPINE HCL 4 % OP SOLN
1.0000 [drp] | Freq: Every day | OPHTHALMIC | Status: DC
  Filled 2015-06-21: qty 15

## 2015-06-21 MED ORDER — LATANOPROST 0.005 % OP SOLN
1.0000 [drp] | Freq: Every day | OPHTHALMIC | Status: DC
  Filled 2015-06-21: qty 2.5

## 2015-06-21 MED ORDER — DORZOLAMIDE HCL-TIMOLOL MAL 2-0.5 % OP SOLN
1.0000 [drp] | Freq: Two times a day (BID) | OPHTHALMIC | Status: DC
  Administered 2015-06-22: 1 [drp] via OPHTHALMIC
  Filled 2015-06-21: qty 10

## 2015-06-21 MED ORDER — GEMFIBROZIL 600 MG PO TABS
600.0000 mg | ORAL_TABLET | Freq: Two times a day (BID) | ORAL | Status: DC
  Administered 2015-06-22 (×2): 600 mg via ORAL
  Filled 2015-06-21 (×3): qty 1

## 2015-06-21 MED ORDER — CLOPIDOGREL BISULFATE 75 MG PO TABS
75.0000 mg | ORAL_TABLET | Freq: Every day | ORAL | Status: DC
  Administered 2015-06-22: 75 mg via ORAL
  Filled 2015-06-21 (×3): qty 1

## 2015-06-21 MED ORDER — AMLODIPINE BESYLATE 5 MG PO TABS
10.0000 mg | ORAL_TABLET | Freq: Every day | ORAL | Status: DC
Start: 2015-06-22 — End: 2015-06-23
  Administered 2015-06-22: 10 mg via ORAL
  Filled 2015-06-21 (×2): qty 2

## 2015-06-21 MED ORDER — SODIUM CHLORIDE 0.9 % IV SOLN
INTRAVENOUS | Status: DC
  Administered 2015-06-22 (×2): via INTRAVENOUS

## 2015-06-21 NOTE — ED Provider Notes (Signed)
CSN: [id number]     Arrival date & time 06/21/15  1341 History   First MD Initiated Contact with Patient 06/21/15 1352     Chief Complaint  Patient presents with  . Dizziness     (Consider location/radiation/quality/duration/timing/severity/associated sxs/prior Treatment) HPI  82 year old female who presents with dizziness and generalized weakness. History of hypertension, hyperlipidemia, TIA. States feeling generally weak and unwell over past week. States longstanding history of feeling dizzy, especially in the morning with standing from bed. Denies vertigo but states the feeling is as if she is about to pass out.  Feels that she is having increasing episodes of this primarily with position changes. No syncope, denies any chest pain or difficulty breathing. No fevers or chills, cough, nausea or vomiting, diarrhea, or abdominal pain. Denies any urinary complaints. No vision or speech changes, no focal weakness or numbness.  Past Medical History  Diagnosis Date  . Hyperlipidemia   . Hypertension   . Mitral regurgitation     sinus bradycardia  . Breast pain     leftr  . Venous insufficiency   . Allergic rhinitis   . Seborrheic keratosis   . Breast mass     left  . Vertigo   . Carpal tunnel syndrome   . Cataract   . Glaucoma   . Constipation   . IBS (irritable bowel syndrome)   . Osteopenia   . Osteoarthritis   . Low back pain   . Breast cancer ([place])   . Anxiety   . Vitamin D deficiency   . Bradycardia   . Carotid atherosclerosis   . Rectocele 06/30/2014  . Cystocele 06/30/2014   Past Surgical History  Procedure Laterality Date  . Appendectomy    . Cholecystectomy    . Keloid      left ear  . Eye surgery      for bleed in left eye  . Cyst removed from left breast    . Spurs on back    . Back surgery    . Abdominal hysterectomy    . Lumbar laminectomy/decompression microdiscectomy N/A 12/25/2014    Procedure: Thoracic eleven-twelve Laminectomy/Diskectomy; Lumbar  three-four Laminectomy/Diskectomy;  Surgeon: Kristeen Miss, MD;  Location: [facility];  Service: Neurosurgery;  Laterality: N/A;  . Breast lumpectomy     Family History  Problem Relation Age of Onset  . Heart disease Mother   . Hypertension Mother   . Diabetes Mother   . Other Daughter     left breast nodule  . Hypertension Son   . Hyperlipidemia Son   . Heart disease Maternal Grandmother   . Hypertension Maternal Grandmother   . Hypertension Son   . Other Son     boils   Social History  Substance Use Topics  . Smoking status: Never Smoker   . Smokeless tobacco: Never Used  . Alcohol Use: No   OB History    Gravida Para Term Preterm AB TAB SAB Ectopic Multiple Living   5 5        5      Review of Systems 10/14 systems reviewed and are negative other than those stated in the HPI   Allergies  Penicillins  Home Medications   Prior to Admission medications   Medication Sig Start Date End Date Taking? Authorizing Provider  ALPRAZolam (XANAX) 0.25 MG tablet Take 0.5-1 tablets (0.125-0.25 mg total) by mouth 2 (two) times daily as needed for anxiety. 12/28/14  Yes Eugenie Filler, MD  amLODipine ([place]) 10  MG tablet Take 1 tablet (10 mg total) by mouth daily. 12/28/14  Yes Eugenie Filler, MD  brimonidine (ALPHAGAN) 0.15 % ophthalmic solution Place 1 drop into both eyes 2 (two) times daily.    Yes Historical Provider, MD  dorzolamide-timolol (COSOPT) 22.3-6.8 MG/ML ophthalmic solution 1 drop 2 (two) times daily.     Yes Historical Provider, MD  hydrALAZINE (APRESOLINE) 25 MG tablet Take 1 tablet (25 mg total) by mouth 2 (two) times daily. Patient taking differently: Take 25 mg by mouth 3 (three) times daily.  12/31/14  Yes Belkys A Regalado, MD  latanoprost (XALATAN) 0.005 % ophthalmic solution Place 1 drop into both eyes at bedtime.   Yes Historical Provider, MD  lisinopril (PRINIVIL,ZESTRIL) 40 MG tablet Take 1 tablet (40 mg total) by mouth daily. 12/28/14  Yes Eugenie Filler, MD  metoprolol tartrate (LOPRESSOR) 25 MG tablet Take 1 tablet (25 mg total) by mouth 2 (two) times daily. 12/31/14  Yes Belkys A Regalado, MD  mirtazapine (REMERON) 15 MG tablet Take 15 mg by mouth at bedtime.   Yes Historical Provider, MD  traMADol (ULTRAM) 50 MG tablet Take 1 tablet (50 mg total) by mouth 2 (two) times daily. Patient taking differently: Take 50 mg by mouth 2 (two) times daily as needed.  12/28/14  Yes Eugenie Filler, MD  Vitamin D, Ergocalciferol, (DRISDOL) 50000 UNITS CAPS capsule Take 50,000 Units by mouth every 30 (thirty) days.    Yes Historical Provider, MD  acetaminophen (TYLENOL) 500 MG tablet Take 500 mg by mouth every 6 (six) hours as needed for mild pain or moderate pain.    Historical Provider, MD  brimonidine (ALPHAGAN) 0.2 % ophthalmic solution Place 1 drop into both eyes 2 (two) times daily. 05/13/15   Historical Provider, MD  clopidogrel (PLAVIX) 75 MG tablet Take 75 mg by mouth daily.    Historical Provider, MD  cyclobenzaprine (FLEXERIL) 5 MG tablet Take 1 tablet (5 mg total) by mouth 2 (two) times daily as needed for muscle spasms. 12/16/14   Nat Christen, MD  furosemide (LASIX) 20 MG tablet Take 20 mg by mouth daily. 06/14/15   Historical Provider, MD  gemfibrozil (LOPID) 600 MG tablet Take 600 mg by mouth 2 (two) times daily before a meal.  02/25/13   Historical Provider, MD  HYDROcodone-acetaminophen (NORCO/VICODIN) 5-325 MG per tablet Take 1 tablet by mouth every 4 (four) hours as needed for moderate pain (mild pain). Patient not taking: Reported on 01/03/2015 12/28/14   Eugenie Filler, MD  meclizine (ANTIVERT) 25 MG tablet Take 12.5 mg by mouth 3 (three) times daily as needed for dizziness.     Historical Provider, MD  Multiple Vitamin (MULTIVITAMIN) tablet Take 1 tablet by mouth daily.    Historical Provider, MD  pilocarpine (PILOCAR) 4 % ophthalmic solution Place 1 drop into both eyes at bedtime. 02/17/14   Historical Provider, MD  Polyethyl  Glycol-Propyl Glycol (SYSTANE OP) Apply 1 drop to eye as needed (for dry eye relief).     Historical Provider, MD  Simethicone (GAS-X PO) Take 1 tablet by mouth daily as needed (for gas relief).     Historical Provider, MD   BP 102/45 mmHg  Pulse 37  Temp(Src) 97.9 F (36.6 C) (Oral)  Resp 9  Ht 5\' 4"  (1.626 m)  Wt 128 lb (58.06 kg)  BMI 21.96 kg/m2  SpO2 100% Physical Exam Physical Exam  Nursing note and vitals reviewed. Constitutional: Well developed, well nourished, non-toxic, and in no  acute distress Head: Normocephalic and atraumatic.  Mouth/Throat: Oropharynx is clear and moist.  Neck: Normal range of motion. Neck supple.  Cardiovascular: Bradycardic rate and regular rhythm.   Pulmonary/Chest: Effort normal and breath sounds normal.  Abdominal: Soft. There is no tenderness. There is no rebound and no guarding.  Musculoskeletal: Normal range of motion.  Neurological: Alert, no facial droop, fluent speech, moves all extremities symmetrically Skin: Skin is warm and dry.  Psychiatric: Cooperative  ED Course  Procedures (including critical care time) Labs Review Labs Reviewed  BASIC METABOLIC PANEL - Abnormal; Notable for the following:    Sodium 147 (*)    Chloride 112 (*)    Glucose, Bld 149 (*)    BUN 33 (*)    Creatinine, Ser 1.54 (*)    Calcium 10.4 (*)    GFR calc non Af Amer 30 (*)    GFR calc Af Amer 35 (*)    All other components within normal limits  URINALYSIS, ROUTINE W REFLEX MICROSCOPIC (NOT AT [facility]) - Abnormal; Notable for the following:    APPearance HAZY (*)    Bilirubin Urine SMALL (*)    Ketones, ur 15 (*)    Protein, ur 100 (*)    Leukocytes, UA MODERATE (*)    All other components within normal limits  MAGNESIUM - Abnormal; Notable for the following:    Magnesium 2.8 (*)    All other components within normal limits  URINE MICROSCOPIC-ADD ON - Abnormal; Notable for the following:    Squamous Epithelial / LPF TOO NUMEROUS TO COUNT (*)     Bacteria, UA MANY (*)    All other components within normal limits  CBG MONITORING, ED - Abnormal; Notable for the following:    Glucose-Capillary 137 (*)    All other components within normal limits  CBC  DIFFERENTIAL  I-STAT TROPOININ, ED    Imaging Review Dg Chest 2 View  06/21/2015  CLINICAL DATA:  Chronic weakness and dizziness over the past year. EXAM: CHEST  2 VIEW COMPARISON:  PA and lateral chest x-ray of December 20, 2009 FINDINGS: The lungs are adequately inflated and clear. The heart and pulmonary vascularity are normal. The mediastinum is normal in width. There is no pleural effusion or pneumothorax. There is mild tortuosity of the descending thoracic aorta. There is multilevel degenerative disc disease of the thoracic spine. There is degenerative change of both shoulders. IMPRESSION: There is no active cardiopulmonary disease. Electronically Signed   By: David  Martinique M.D.   On: 06/21/2015 15:48   I have personally reviewed and evaluated these images and lab results as part of my medical decision-making.   EKG Interpretation   Date/Time:  Monday June 21 2015 13:53:02 EST Ventricular Rate:  43 PR Interval:    QRS Duration: 105 QT Interval:  449 QTC Calculation: 380 R Axis:   74 Text Interpretation:  Junctional rhythm Probable anteroseptal infarct, old  No change since ekg 01/26/2015 Confirmed by Buell Parcel MD, Tatelyn Vanhecke [phone]) on  06/21/2015 2:03:47 PM      MDM   Final diagnoses:  Near syncope  Junctional bradycardia  Generalized weakness  AKI (acute kidney injury) ([place])     in short, this is an 82 year old female with history of hypertension, hyperlipidemia and history of sinus bradycardia who presents with near syncopal episodes. On presentation is nontoxic in no acute distress. Is noted to have bradycardia with a heart rate between 38 and 45. On EKG this appears to be a junctional rhythm without  appreciable P waves. She has 1 prior history of documented junctional bradycardia  several months ago , and has seen Dr.Koneswaran from cardiology for bradycardia. However, he cut documented that she has only had prior history of sinus bradycardia and at that time given that she has not routinely symptomatic  Has deferred pacemaker placement and felt that her symptoms may also be more consistent with orthostatic hypotension.  On my chart review, she has also been placed on metoprolol since her last visit with her cardiologist. Unclear if this may also be playing a role in her junctional bradycardia here today as well. Blood work here reveals evidence of mild AK I, but no other major metabolic or electrolyte derangements. Her troponin is negative, and there is no evidence of infection. I've discussed this patient with Dr. Darrick Meigs, given her  Episodic junctional bradycardia with increased frequency of near-syncopal episodes.  She will be admitted under telemetry for further evaluation.    Forde Dandy, MD 06/21/15 [id number]

## 2015-06-21 NOTE — ED Notes (Addendum)
Pt reports feeling weak and dizzy x1 year, "not able to carry legs around."  Pt has a hard time standing up.  Pt reports that she gets dizzy every morning after breakfast.  Pt alert and oriented.

## 2015-06-21 NOTE — H&P (Signed)
PCP:   FANTA,TESFAYE, MD   Chief Complaint:  Dizziness  HPI: 82 year old female who   has a past medical history of Hyperlipidemia; Hypertension; Mitral regurgitation; Breast pain; Venous insufficiency; Allergic rhinitis; Seborrheic keratosis; Breast mass; Vertigo; Carpal tunnel syndrome; Cataract; Glaucoma; Constipation; IBS (irritable bowel syndrome); Osteopenia; Osteoarthritis; Low back pain; Breast cancer ([place]); Anxiety; Vitamin D deficiency; Bradycardia; Carotid atherosclerosis; Rectocele (06/30/2014); and Cystocele (06/30/2014). The present to the hospital with dizziness and generalized weakness. As per patient she has long-standing history of dizziness and this morning she was dizzy and felt as she was about to pass out but did not pass out. Denies any chest pain no shortness of breath. She felt nausea but no vomiting. Had 4 loose bowel movements since she came to the ED. Denies any urinary complaints.   In the ED patient found to be in bradycardia, mild dehydration with acute kidney injury. Orthostatic vital signs negative in the ED EKG showed junctional rhythm. Cardiac enzymes negative. Calcium 10.4. Sodium 147  Allergies:   Allergies  Allergen Reactions  . Penicillins Rash    Has patient had a PCN reaction causing immediate rash, facial/tongue/throat swelling, SOB or lightheadedness with hypotension: Yes Has patient had a PCN reaction causing severe rash involving mucus membranes or skin necrosis: No Has patient had a PCN reaction that required hospitalization No Has patient had a PCN reaction occurring within the last 10 years: Yes If all of the above answers are "NO", then may proceed with Cephalosporin use.      Past Medical History  Diagnosis Date  . Hyperlipidemia   . Hypertension   . Mitral regurgitation     sinus bradycardia  . Breast pain     leftr  . Venous insufficiency   . Allergic rhinitis   . Seborrheic keratosis   . Breast mass     left  . Vertigo     . Carpal tunnel syndrome   . Cataract   . Glaucoma   . Constipation   . IBS (irritable bowel syndrome)   . Osteopenia   . Osteoarthritis   . Low back pain   . Breast cancer ([place])   . Anxiety   . Vitamin D deficiency   . Bradycardia   . Carotid atherosclerosis   . Rectocele 06/30/2014  . Cystocele 06/30/2014    Past Surgical History  Procedure Laterality Date  . Appendectomy    . Cholecystectomy    . Keloid      left ear  . Eye surgery      for bleed in left eye  . Cyst removed from left breast    . Spurs on back    . Back surgery    . Abdominal hysterectomy    . Lumbar laminectomy/decompression microdiscectomy N/A 12/25/2014    Procedure: Thoracic eleven-twelve Laminectomy/Diskectomy; Lumbar three-four Laminectomy/Diskectomy;  Surgeon: Kristeen Miss, MD;  Location: [facility];  Service: Neurosurgery;  Laterality: N/A;  . Breast lumpectomy      Prior to Admission medications   Medication Sig Start Date End Date Taking? Authorizing Provider  ALPRAZolam (XANAX) 0.25 MG tablet Take 0.5-1 tablets (0.125-0.25 mg total) by mouth 2 (two) times daily as needed for anxiety. 12/28/14  Yes Eugenie Filler, MD  amLODipine (NORVASC) 10 MG tablet Take 1 tablet (10 mg total) by mouth daily. 12/28/14  Yes Eugenie Filler, MD  brimonidine (ALPHAGAN) 0.15 % ophthalmic solution Place 1 drop into both eyes 2 (two) times daily.    Yes Historical  Provider, MD  dorzolamide-timolol (COSOPT) 22.3-6.8 MG/ML ophthalmic solution 1 drop 2 (two) times daily.     Yes Historical Provider, MD  hydrALAZINE (APRESOLINE) 25 MG tablet Take 1 tablet (25 mg total) by mouth 2 (two) times daily. Patient taking differently: Take 25 mg by mouth 3 (three) times daily.  12/31/14  Yes Belkys A Regalado, MD  latanoprost (XALATAN) 0.005 % ophthalmic solution Place 1 drop into both eyes at bedtime.   Yes Historical Provider, MD  lisinopril (PRINIVIL,ZESTRIL) 40 MG tablet Take 1 tablet (40 mg total) by mouth daily. 12/28/14   Yes Eugenie Filler, MD  metoprolol tartrate (LOPRESSOR) 25 MG tablet Take 1 tablet (25 mg total) by mouth 2 (two) times daily. 12/31/14  Yes Belkys A Regalado, MD  mirtazapine (REMERON) 15 MG tablet Take 15 mg by mouth at bedtime.   Yes Historical Provider, MD  traMADol (ULTRAM) 50 MG tablet Take 1 tablet (50 mg total) by mouth 2 (two) times daily. Patient taking differently: Take 50 mg by mouth 2 (two) times daily as needed.  12/28/14  Yes Eugenie Filler, MD  Vitamin D, Ergocalciferol, (DRISDOL) 50000 UNITS CAPS capsule Take 50,000 Units by mouth every 30 (thirty) days.    Yes Historical Provider, MD  acetaminophen (TYLENOL) 500 MG tablet Take 500 mg by mouth every 6 (six) hours as needed for mild pain or moderate pain.    Historical Provider, MD  brimonidine (ALPHAGAN) 0.2 % ophthalmic solution Place 1 drop into both eyes 2 (two) times daily. 05/13/15   Historical Provider, MD  clopidogrel (PLAVIX) 75 MG tablet Take 75 mg by mouth daily.    Historical Provider, MD  cyclobenzaprine (FLEXERIL) 5 MG tablet Take 1 tablet (5 mg total) by mouth 2 (two) times daily as needed for muscle spasms. 12/16/14   Nat Christen, MD  furosemide (LASIX) 20 MG tablet Take 20 mg by mouth daily. 06/14/15   Historical Provider, MD  gemfibrozil (LOPID) 600 MG tablet Take 600 mg by mouth 2 (two) times daily before a meal.  02/25/13   Historical Provider, MD  HYDROcodone-acetaminophen (NORCO/VICODIN) 5-325 MG per tablet Take 1 tablet by mouth every 4 (four) hours as needed for moderate pain (mild pain). Patient not taking: Reported on 01/03/2015 12/28/14   Eugenie Filler, MD  meclizine (ANTIVERT) 25 MG tablet Take 12.5 mg by mouth 3 (three) times daily as needed for dizziness.     Historical Provider, MD  Multiple Vitamin (MULTIVITAMIN) tablet Take 1 tablet by mouth daily.    Historical Provider, MD  pilocarpine (PILOCAR) 4 % ophthalmic solution Place 1 drop into both eyes at bedtime. 02/17/14   Historical Provider, MD   Polyethyl Glycol-Propyl Glycol (SYSTANE OP) Apply 1 drop to eye as needed (for dry eye relief).     Historical Provider, MD  Simethicone (GAS-X PO) Take 1 tablet by mouth daily as needed (for gas relief).     Historical Provider, MD    Social History:  reports that she has never smoked. She has never used smokeless tobacco. She reports that she does not drink alcohol or use illicit drugs.  Family History  Problem Relation Age of Onset  . Heart disease Mother   . Hypertension Mother   . Diabetes Mother   . Other Daughter     left breast nodule  . Hypertension Son   . Hyperlipidemia Son   . Heart disease Maternal Grandmother   . Hypertension Maternal Grandmother   . Hypertension Son   .  Other Son     boils    Filed Weights   06/21/15 1350  Weight: 58.06 kg (128 lb)    All the positives are listed in BOLD  Review of Systems:  HEENT: Headache, blurred vision, runny nose, sore throat, dizziness Neck: Hypothyroidism, hyperthyroidism,,lymphadenopathy Chest : Shortness of breath, history of COPD, Asthma Heart : Chest pain, history of coronary arterey disease GI:  Nausea, vomiting, diarrhea, constipation, GERD GU: Dysuria, urgency, frequency of urination, hematuria Neuro: Stroke, seizures, syncope Psych: Depression, anxiety, hallucinations   Physical Exam: Blood pressure 102/45, pulse 37, temperature 97.9 F (36.6 C), temperature source Oral, resp. rate 9, height 5\' 4"  (1.626 m), weight 58.06 kg (128 lb), SpO2 100 %. Constitutional:   Patient is a well-developed and well-nourished female* in no acute distress and cooperative with exam. Head: Normocephalic and atraumatic Mouth: Mucus membranes moist Eyes: PERRL, EOMI, conjunctivae normal Neck: Supple, No Thyromegaly Cardiovascular: RRR, S1 normal, S2 normal Pulmonary/Chest: CTAB, no wheezes, rales, or rhonchi Abdominal: Soft. Non-tender, non-distended, bowel sounds are normal, no masses, organomegaly, or guarding present.   Neurological: A&O x3, Strength is normal and symmetric bilaterally, cranial nerve II-XII are grossly intact, no focal motor deficit, sensory intact to light touch bilaterally.  Extremities : No Cyanosis, Clubbing or Edema  Labs on Admission:  Basic Metabolic Panel:  Recent Labs Lab 06/21/15 1400  NA 147*  K 4.5  CL 112*  CO2 27  GLUCOSE 149*  BUN 33*  CREATININE 1.54*  CALCIUM 10.4*  MG 2.8*   CBC:  Recent Labs Lab 06/21/15 1400  WBC 8.0  NEUTROABS 4.6  HGB 12.4  HCT 39.3  MCV 90.3  PLT 336   No results for input(s): PROBNP in the last 8760 hours.  CBG:  Recent Labs Lab 06/21/15 1353  GLUCAP 137*    Radiological Exams on Admission: Dg Chest 2 View  06/21/2015  CLINICAL DATA:  Chronic weakness and dizziness over the past year. EXAM: CHEST  2 VIEW COMPARISON:  PA and lateral chest x-ray of December 20, 2009 FINDINGS: The lungs are adequately inflated and clear. The heart and pulmonary vascularity are normal. The mediastinum is normal in width. There is no pleural effusion or pneumothorax. There is mild tortuosity of the descending thoracic aorta. There is multilevel degenerative disc disease of the thoracic spine. There is degenerative change of both shoulders. IMPRESSION: There is no active cardiopulmonary disease. Electronically Signed   By: David  Martinique M.D.   On: 06/21/2015 15:48    EKG: Independently reviewed. Normal sinus rhythm   Assessment/Plan Active Problems:   Glaucoma   Cerebrovascular disease   Essential hypertension   Near syncope  Near-syncope We'll admit the patient in telemetry. Cycle cardiac enzymes. Hold metoprolol. Check orthostatics every shift.  Bradycardia Patient on metoprolol. We'll hold metoprolol at this time Bradycardia has improved in the ED. Will monitor closely on telemetry.  Essential hypertension Will hold metoprolol and lisinopril at this time. Continued amlodipine and hydralazine.  Diarrhea Patient had 4 loose  bowel movements in the ED. Will check stool for C. Difficile  Hypercalcemia Calcium 10.4, will start IV hydration. Follow calcium level in a.m.  Acute kidney injury Patient's BUN/creatinine 33/1.54  Her baseline creatinine of 0.99 as of September 2016 Continue IV fluids. Follow BMP in a.m.  DVT prophylaxis Lovenox  Code status: DNI  Family discussion: Admission, patients condition and plan of care including tests being ordered have been discussed with the patient and her caregiver at bedside who indicate understanding  and agree with the plan and Code Status.   Time Spent on Admission: 60 min  [facility] Pager: [phone] 06/21/2015, 4:38 PM  If 7PM-7AM, please contact night-coverage  [email or web address]  Password [email or web address]

## 2015-06-21 NOTE — ED Notes (Signed)
Pt had one episode of formed stool and one episode of loose stool.  Reports abdominal cramping that just began.

## 2015-06-22 DIAGNOSIS — I10 Essential (primary) hypertension: Secondary | ICD-10-CM | POA: Diagnosis not present

## 2015-06-22 DIAGNOSIS — I679 Cerebrovascular disease, unspecified: Secondary | ICD-10-CM | POA: Diagnosis not present

## 2015-06-22 DIAGNOSIS — R55 Syncope and collapse: Secondary | ICD-10-CM | POA: Diagnosis not present

## 2015-06-22 DIAGNOSIS — I498 Other specified cardiac arrhythmias: Secondary | ICD-10-CM | POA: Diagnosis not present

## 2015-06-22 DIAGNOSIS — R001 Bradycardia, unspecified: Secondary | ICD-10-CM | POA: Diagnosis not present

## 2015-06-22 LAB — COMPREHENSIVE METABOLIC PANEL
ALT: 9 U/L — AB (ref 14–54)
AST: 13 U/L — ABNORMAL LOW (ref 15–41)
Albumin: 3.4 g/dL — ABNORMAL LOW (ref 3.5–5.0)
Alkaline Phosphatase: 77 U/L (ref 38–126)
Anion gap: 7 (ref 5–15)
BILIRUBIN TOTAL: 0.6 mg/dL (ref 0.3–1.2)
BUN: 29 mg/dL — ABNORMAL HIGH (ref 6–20)
CALCIUM: 9.4 mg/dL (ref 8.9–10.3)
CHLORIDE: 114 mmol/L — AB (ref 101–111)
CO2: 24 mmol/L (ref 22–32)
CREATININE: 1.06 mg/dL — AB (ref 0.44–1.00)
GFR, EST AFRICAN AMERICAN: 55 mL/min — AB (ref >60)
GFR, EST NON AFRICAN AMERICAN: 47 mL/min — AB (ref >60)
Glucose, Bld: 91 mg/dL (ref 65–99)
Potassium: 3.5 mmol/L (ref 3.5–5.1)
Sodium: 145 mmol/L (ref 135–145)
TOTAL PROTEIN: 6 g/dL — AB (ref 6.5–8.1)

## 2015-06-22 LAB — CBC
HEMATOCRIT: 34.2 % — AB (ref 36.0–46.0)
Hemoglobin: 11 g/dL — ABNORMAL LOW (ref 12.0–15.0)
MCH: 28.6 pg (ref 26.0–34.0)
MCHC: 32.2 g/dL (ref 30.0–36.0)
MCV: 89.1 fL (ref 78.0–100.0)
PLATELETS: 275 10*3/uL (ref 150–400)
RBC: 3.84 MIL/uL — AB (ref 3.87–5.11)
RDW: 14.8 % (ref 11.5–15.5)
WBC: 8 10*3/uL (ref 4.0–10.5)

## 2015-06-22 LAB — TROPONIN I
Troponin I: <0.03 ng/mL (ref <0.031)
Troponin I: <0.03 ng/mL (ref <0.031)

## 2015-06-22 MED ORDER — ENOXAPARIN SODIUM 40 MG/0.4ML ~~LOC~~ SOLN
40.0000 mg | SUBCUTANEOUS | Status: DC
  Administered 2015-06-22: 40 mg via SUBCUTANEOUS
  Filled 2015-06-22: qty 0.4

## 2015-06-22 MED ORDER — POTASSIUM CHLORIDE 20 MEQ PO PACK
40.0000 meq | PACK | Freq: Once | ORAL | Status: AC
  Administered 2015-06-22: 40 meq via ORAL
  Filled 2015-06-22: qty 2

## 2015-06-22 MED ORDER — POTASSIUM CHLORIDE 20 MEQ PO PACK
20.0000 meq | PACK | Freq: Every day | ORAL | Status: DC
  Filled 2015-06-22: qty 1

## 2015-06-22 NOTE — Care Management Obs Status (Signed)
[facility] NOTIFICATION   Patient Details  Name: Jodi Peters MRN: [id number] Date of Birth: [birth year]   Medicare Observation Status Notification Given:  Yes    Alvie Heidelberg, RN 06/22/2015, 11:18 AM

## 2015-06-22 NOTE — Progress Notes (Signed)
Subjective: Patient was admitted due to dizziness and near syncope. She had bradycardia. Her Betablocker is discontinued.  Objective: Vital signs in last 24 hours: Temp:  [97.9 F (36.6 C)-98.5 F (36.9 C)] 98.5 F (36.9 C) (01/10 0653) Pulse Rate:  [37-74] 68 (01/09 2123) Resp:  [9-20] 20 (01/10 0653) BP: (89-151)/(45-118) 110/62 mmHg (01/09 2123) SpO2:  [92 %-100 %] 100 % (01/10 0653) Weight:  [58.06 kg (128 lb)-58.786 kg (129 lb 9.6 oz)] 58.786 kg (129 lb 9.6 oz) (01/09 1753) Weight change:  Last BM Date: 06/21/15  Intake/Output from previous day:    PHYSICAL EXAM General appearance: alert and no distress Resp: clear to auscultation bilaterally Cardio: S1, S2 normal GI: soft, non-tender; bowel sounds normal; no masses,  no organomegaly Extremities: extremities normal, atraumatic, no cyanosis or edema  Lab Results:  Results for orders placed or performed during the hospital encounter of 06/21/15 (from the past 48 hour(s))  CBG monitoring, ED     Status: Abnormal   Collection Time: 06/21/15  1:53 PM  Result Value Ref Range   Glucose-Capillary 137 (H) 65 - 99 mg/dL  Basic metabolic panel     Status: Abnormal   Collection Time: 06/21/15  2:00 PM  Result Value Ref Range   Sodium 147 (H) 135 - 145 mmol/L   Potassium 4.5 3.5 - 5.1 mmol/L   Chloride 112 (H) 101 - 111 mmol/L   CO2 27 22 - 32 mmol/L   Glucose, Bld 149 (H) 65 - 99 mg/dL   BUN 33 (H) 6 - 20 mg/dL   Creatinine, Ser 1.54 (H) 0.44 - 1.00 mg/dL   Calcium 10.4 (H) 8.9 - 10.3 mg/dL   GFR calc non Af Amer 30 (L) >60 mL/min   GFR calc Af Amer 35 (L) >60 mL/min    Comment: (NOTE) The eGFR has been calculated using the CKD EPI equation. This calculation has not been validated in all clinical situations. eGFR's persistently <60 mL/min signify possible Chronic Kidney Disease.    Anion gap 8 5 - 15  CBC     Status: None   Collection Time: 06/21/15  2:00 PM  Result Value Ref Range   WBC 8.0 4.0 - 10.5 K/uL   RBC  4.35 3.87 - 5.11 MIL/uL   Hemoglobin 12.4 12.0 - 15.0 g/dL   HCT 39.3 36.0 - 46.0 %   MCV 90.3 78.0 - 100.0 fL   MCH 28.5 26.0 - 34.0 pg   MCHC 31.6 30.0 - 36.0 g/dL   RDW 14.6 11.5 - 15.5 %   Platelets 336 150 - 400 K/uL  Differential     Status: None   Collection Time: 06/21/15  2:00 PM  Result Value Ref Range   Neutrophils Relative % 60 %   Neutro Abs 4.6 1.7 - 7.7 K/uL   Lymphocytes Relative 32 %   Lymphs Abs 2.4 0.7 - 4.0 K/uL   Monocytes Relative 6 %   Monocytes Absolute 0.5 0.1 - 1.0 K/uL   Eosinophils Relative 1 %   Eosinophils Absolute 0.1 0.0 - 0.7 K/uL   Basophils Relative 1 %   Basophils Absolute 0.1 0.0 - 0.1 K/uL  Magnesium     Status: Abnormal   Collection Time: 06/21/15  2:00 PM  Result Value Ref Range   Magnesium 2.8 (H) 1.7 - 2.4 mg/dL  Troponin I     Status: None   Collection Time: 06/21/15  2:00 PM  Result Value Ref Range   Troponin I <0.03 <0.031 ng/mL  Comment:        NO INDICATION OF MYOCARDIAL INJURY.   I-Stat Troponin, ED (not at [facility])     Status: None   Collection Time: 06/21/15  2:15 PM  Result Value Ref Range   Troponin i, poc 0.02 0.00 - 0.08 ng/mL   Comment 3            Comment: Due to the release kinetics of cTnI, a negative result within the first hours of the onset of symptoms does not rule out myocardial infarction with certainty. If myocardial infarction is still suspected, repeat the test at appropriate intervals.   Urinalysis, Routine w reflex microscopic (not at [facility])     Status: Abnormal   Collection Time: 06/21/15  2:30 PM  Result Value Ref Range   Color, Urine YELLOW YELLOW   APPearance HAZY (A) CLEAR   Specific Gravity, Urine 1.025 1.005 - 1.030   pH 5.5 5.0 - 8.0   Glucose, UA NEGATIVE NEGATIVE mg/dL   Hgb urine dipstick NEGATIVE NEGATIVE   Bilirubin Urine SMALL (A) NEGATIVE   Ketones, ur 15 (A) NEGATIVE mg/dL   Protein, ur 100 (A) NEGATIVE mg/dL   Nitrite NEGATIVE NEGATIVE   Leukocytes, UA MODERATE (A) NEGATIVE   Urine microscopic-add on     Status: Abnormal   Collection Time: 06/21/15  2:30 PM  Result Value Ref Range   Squamous Epithelial / LPF TOO NUMEROUS TO COUNT (A) NONE SEEN   WBC, UA 6-30 0 - 5 WBC/hpf   RBC / HPF NONE SEEN 0 - 5 RBC/hpf   Bacteria, UA MANY (A) NONE SEEN  Troponin I     Status: None   Collection Time: 06/22/15 12:05 AM  Result Value Ref Range   Troponin I <0.03 <0.031 ng/mL    Comment:        NO INDICATION OF MYOCARDIAL INJURY.   Troponin I     Status: None   Collection Time: 06/22/15  6:00 AM  Result Value Ref Range   Troponin I <0.03 <0.031 ng/mL    Comment:        NO INDICATION OF MYOCARDIAL INJURY.   CBC     Status: Abnormal   Collection Time: 06/22/15  6:00 AM  Result Value Ref Range   WBC 8.0 4.0 - 10.5 K/uL   RBC 3.84 (L) 3.87 - 5.11 MIL/uL   Hemoglobin 11.0 (L) 12.0 - 15.0 g/dL   HCT 34.2 (L) 36.0 - 46.0 %   MCV 89.1 78.0 - 100.0 fL   MCH 28.6 26.0 - 34.0 pg   MCHC 32.2 30.0 - 36.0 g/dL   RDW 14.8 11.5 - 15.5 %   Platelets 275 150 - 400 K/uL  Comprehensive metabolic panel     Status: Abnormal   Collection Time: 06/22/15  6:00 AM  Result Value Ref Range   Sodium 145 135 - 145 mmol/L   Potassium 3.5 3.5 - 5.1 mmol/L    Comment: DELTA CHECK NOTED   Chloride 114 (H) 101 - 111 mmol/L   CO2 24 22 - 32 mmol/L   Glucose, Bld 91 65 - 99 mg/dL   BUN 29 (H) 6 - 20 mg/dL   Creatinine, Ser 1.06 (H) 0.44 - 1.00 mg/dL   Calcium 9.4 8.9 - 10.3 mg/dL   Total Protein 6.0 (L) 6.5 - 8.1 g/dL   Albumin 3.4 (L) 3.5 - 5.0 g/dL   AST 13 (L) 15 - 41 U/L   ALT 9 (L) 14 - 54 U/L  Alkaline Phosphatase 77 38 - 126 U/L   Total Bilirubin 0.6 0.3 - 1.2 mg/dL   GFR calc non Af Amer 47 (L) >60 mL/min   GFR calc Af Amer 55 (L) >60 mL/min    Comment: (NOTE) The eGFR has been calculated using the CKD EPI equation. This calculation has not been validated in all clinical situations. eGFR's persistently <60 mL/min signify possible Chronic Kidney Disease.    Anion gap 7  5 - 15    ABGS No results for input(s): PHART, PO2ART, TCO2, HCO3 in the last 72 hours.  Invalid input(s): PCO2 CULTURES No results found for this or any previous visit (from the past 240 hour(s)). Studies/Results: Dg Chest 2 View  06/21/2015  CLINICAL DATA:  Chronic weakness and dizziness over the past year. EXAM: CHEST  2 VIEW COMPARISON:  PA and lateral chest x-ray of December 20, 2009 FINDINGS: The lungs are adequately inflated and clear. The heart and pulmonary vascularity are normal. The mediastinum is normal in width. There is no pleural effusion or pneumothorax. There is mild tortuosity of the descending thoracic aorta. There is multilevel degenerative disc disease of the thoracic spine. There is degenerative change of both shoulders. IMPRESSION: There is no active cardiopulmonary disease. Electronically Signed   By: David  Martinique M.D.   On: 06/21/2015 15:48    Medications:  Prior to Admission:  Prescriptions prior to admission  Medication Sig Dispense Refill Last Dose  . acetaminophen (TYLENOL) 500 MG tablet Take 500 mg by mouth every 6 (six) hours as needed for mild pain or moderate pain.   Past Week at Unknown time  . amLODipine (NORVASC) 10 MG tablet Take 1 tablet (10 mg total) by mouth daily. 30 tablet 0 06/21/2015 at Unknown time  . brimonidine (ALPHAGAN) 0.2 % ophthalmic solution Place 1 drop into both eyes 2 (two) times daily.   06/21/2015 at Unknown time  . Cholecalciferol (VITAMIN D3) 5000 units CAPS Take 1 capsule by mouth daily.   06/21/2015 at Unknown time  . diphenhydramine-acetaminophen (TYLENOL PM) 25-500 MG TABS tablet Take 1 tablet by mouth at bedtime as needed (pain/sleep).   Past Week at Unknown time  . dorzolamide-timolol (COSOPT) 22.3-6.8 MG/ML ophthalmic solution 1 drop 2 (two) times daily.     Past Month at Unknown time  . furosemide (LASIX) 20 MG tablet Take 20 mg by mouth daily.   06/21/2015 at Unknown time  . hydrALAZINE (APRESOLINE) 25 MG tablet Take 1 tablet (25 mg  total) by mouth 2 (two) times daily. (Patient taking differently: Take 25 mg by mouth 3 (three) times daily. ) 60 tablet 0 06/21/2015 at Unknown time  . latanoprost (XALATAN) 0.005 % ophthalmic solution Place 1 drop into both eyes at bedtime.   06/20/2015 at Unknown time  . lisinopril (PRINIVIL,ZESTRIL) 40 MG tablet Take 1 tablet (40 mg total) by mouth daily. 30 tablet 0 06/21/2015 at Unknown time  . metoprolol tartrate (LOPRESSOR) 25 MG tablet Take 1 tablet (25 mg total) by mouth 2 (two) times daily. (Patient taking differently: Take 25 mg by mouth daily. ) 30 tablet 0 06/21/2015 at 1030a  . mirtazapine (REMERON) 15 MG tablet Take 15 mg by mouth at bedtime.   06/20/2015 at Unknown time  . pilocarpine (PILOCAR) 4 % ophthalmic solution Place 1 drop into both eyes at bedtime.   06/20/2015 at Unknown time  . Polyethyl Glycol-Propyl Glycol (SYSTANE OP) Apply 1 drop to eye as needed (for dry eye relief).    Past Month at  Unknown time  . Simethicone (GAS-X PO) Take 1 tablet by mouth daily as needed (for gas relief).    Past Month at Unknown time    Assesment:   Active Problems:   Glaucoma   Cerebrovascular disease   Essential hypertension   Near syncope bradycardia Prerenal azotemia  Plan:  Medications reviewed Continue telemetry Cardiology consult      Jodi Peters 06/22/2015, 8:40 AM

## 2015-06-22 NOTE — Progress Notes (Signed)
Initial Nutrition Assessment  DOCUMENTATION CODES:  Not applicable  INTERVENTION:  Took snack/meal preferences  Gave some education on increasing calories/protein at home  NUTRITION DIAGNOSIS:  Inadequate oral intake related to poor appetite as evidenced by loss of >10% bw in 6 months.  GOAL:  Patient will meet greater than or equal to 90% of their needs  MONITOR:  PO intake, I & O's, Labs  REASON FOR ASSESSMENT:  Malnutrition Screening Tool    ASSESSMENT:  82 y/o female PMHx HTN, HLD, Breast Cancer, stroke, Anxiety presents to hospital with dizzines, nausea  and generalized weakness. She also reports 4 loose bowel movements since coming to ED. Medication changes made.   Spoke with pt and her son. Pt reports that her she has noticed a decrease in her appetite. She eats 2 meals a day, but this is her baseline. Son states her appetite is hit or miss and sometimes she eats well and other times she does not. Pt reports noticing these changes after her stroke and subsequent SNF admission from which she left at the start of September.  Her ability to cook for herself has also been impacted since that event which may have played a part in her wt loss.   Pt has tried Ensure/Boost in the past, but does not regularly drink these. Pt reports following a low sodium diet, however as our discussion progressed it became evident that this was a very liberal "low sodium diet". She notes some residual dysphagia, however this only affects her ability to swallow her medications and not her food.   She states that her UBW WAS 150 lbs. She last remembers weighing this 1 year ago. Per EMR documentation, she has lost >10% bw in 6 months. Unsure of exact etiology as it did not sound like there a huge change in her oral intake. She had some diarrhea and significant wt loss could be partially from being dehydrated on weighing.    NFPE: WDL  Diet Order:  Diet Heart Room service appropriate?: Yes; Fluid  consistency:: Thin  Skin:  Reviewed, no issues  Last BM:  1/9-diarrhea  Height:  Ht Readings from Last 1 Encounters:  06/21/15 5\' 4"  (1.626 m)   Weight:  Wt Readings from Last 1 Encounters:  06/21/15 129 lb 9.6 oz (58.786 kg)   Wt Readings from Last 10 Encounters:  06/21/15 129 lb 9.6 oz (58.786 kg)  01/26/15 149 lb (67.586 kg)  12/22/14 141 lb (63.957 kg)  12/22/14 145 lb (65.772 kg)  12/16/14 145 lb (65.772 kg)  06/30/14 144 lb (65.318 kg)  02/18/14 143 lb (64.864 kg)  07/25/13 143 lb (64.864 kg)  05/02/13 144 lb 1.6 oz (65.363 kg)  04/24/13 145 lb (65.772 kg)   Ideal Body Weight:  54.54 kg  BMI:  Body mass index is 22.23 kg/(m^2).  Estimated Nutritional Needs:  Kcal:  1450-1650 (25-28 kcal/kg bw) Protein:  59-71g (1-1.2 g/kg bw) Fluid:  1.5-1.7 liters  EDUCATION NEEDS:  Education needs addressed  Burtis Junes RD, LDN Nutrition Pager: [phone] 06/22/2015 1:59 PM

## 2015-06-22 NOTE — Care Management (Signed)
Spoke with patient who answered questions appropriately.  Patient stated that she uses a cane and a walker at home and that her home is two stories but that she is " not allowed to go upstairs" because of difficulty going up stairs. Patient stated that she lives with her adult son who is in his 3's. Patient stated that she has a family member who is a CNA that also comes to the home to help her. Continue to follow for discharge needs.

## 2015-06-22 NOTE — Consult Note (Signed)
CARDIOLOGY CONSULT NOTE   Patient ID: Jodi Peters MRN: [id number] DOB/AGE: 1929/11/05 82 y.o.  Admit Date: 06/21/2015 Referring Physician: Rosita Fire MD Primary Physician: Rosita Fire, MD Consulting Cardiologist: Kate Sable MD Primary Cardiologist: Kate Sable MD Reason for Consultation: Bradycardia with dizziness  Clinical Summary Jodi Peters is a 82 y.o.female with multiple medical problems, (please see H&P), with hx of dizziness. 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 She was placed on DAPT with ASA and Plavix. 01/2015. Due to bradycardia history she was taken off of BB, but on review of home medications, she is on metoprolol 25 mg daily. She was apparently placed back on this after surgery for spinal cord compression in 12/2014 on discharge. She states that she saw Dr. Legrand Rams last week, and he stopped two of her medications. She does not know the names of the medications. I do not see notes in EPIC.   She presented to ER with complaints of near syncope and generalized weakness. BP 115/70. HR 40, afebrile. EKG demonstrated junctional rhythm with rate of 43 bpm. Compared to EKG in 01/2015 she was in junctional rhythm as well when seen in ER for worsening neurologic deficit. BB was continued at that time and she was discharged for neruo follow up. Labs demonstrated proteinuria, with ketones but no nitrates. Na 147, Glucose 149, Creatinine 1.54. Mg 2.8. CXR negative for pneumonia or CHF. She was treated with IV fluids and taken off of BB by admitting Hospitalist, Dr Darrick Meigs. .   Allergies  Allergen Reactions  . Penicillins Rash    Has patient had a PCN reaction causing immediate rash, facial/tongue/throat swelling, SOB or lightheadedness with hypotension: Yes Has patient had a PCN reaction causing severe rash involving mucus membranes or skin necrosis: No Has  patient had a PCN reaction that required hospitalization No Has patient had a PCN reaction occurring within the last 10 years: Yes If all of the above answers are "NO", then may proceed with Cephalosporin use.    Medications Scheduled Medications: . amLODipine  10 mg Oral Daily  . clopidogrel  75 mg Oral Daily  . dorzolamide-timolol  1 drop Both Eyes BID  . enoxaparin (LOVENOX) injection  30 mg Subcutaneous Q24H  . feeding supplement (ENSURE ENLIVE)  237 mL Oral BID BM  . gemfibrozil  600 mg Oral BID AC  . hydrALAZINE  25 mg Oral BID  . latanoprost  1 drop Both Eyes QHS  . mirtazapine  15 mg Oral QHS  . pilocarpine  1 drop Both Eyes QHS  . Vitamin D (Ergocalciferol)  50,000 Units Oral Q30 days    Infusions: . sodium chloride 75 mL/hr at 06/22/15 0653    PRN Medications: acetaminophen, ALPRAZolam, HYDROcodone-acetaminophen, meclizine, ondansetron **OR** ondansetron (ZOFRAN) IV   Past Medical History  Diagnosis Date  . Hyperlipidemia   . Hypertension   . Mitral regurgitation     sinus bradycardia  . Breast pain     leftr  . Venous insufficiency   . Allergic rhinitis   . Seborrheic keratosis   . Breast mass     left  . Vertigo   . Carpal tunnel syndrome   . Cataract   . Glaucoma   . Constipation   . IBS (irritable bowel syndrome)   . Osteopenia   . Osteoarthritis   . Low back pain   . Breast cancer ([place])   . Anxiety   .  Vitamin D deficiency   . Bradycardia   . Carotid atherosclerosis   . Rectocele 06/30/2014  . Cystocele 06/30/2014    Past Surgical History  Procedure Laterality Date  . Appendectomy    . Cholecystectomy    . Keloid      left ear  . Eye surgery      for bleed in left eye  . Cyst removed from left breast    . Spurs on back    . Back surgery    . Abdominal hysterectomy    . Lumbar laminectomy/decompression microdiscectomy N/A 12/25/2014    Procedure: Thoracic eleven-twelve Laminectomy/Diskectomy; Lumbar three-four Laminectomy/Diskectomy;   Surgeon: Kristeen Miss, MD;  Location: [facility];  Service: Neurosurgery;  Laterality: N/A;  . Breast lumpectomy      Family History  Problem Relation Age of Onset  . Heart disease Mother   . Hypertension Mother   . Diabetes Mother   . Other Daughter     left breast nodule  . Hypertension Son   . Hyperlipidemia Son   . Heart disease Maternal Grandmother   . Hypertension Maternal Grandmother   . Hypertension Son   . Other Son     boils    Social History Jodi Peters reports that she has never smoked. She has never used smokeless tobacco. Jodi Peters reports that she does not drink alcohol.  Review of Systems Complete review of systems are found to be negative unless outlined in H&P above.  Physical Examination Blood pressure 110/62, pulse 68, temperature 98.5 F (36.9 C), temperature source Oral, resp. rate 20, height 5\' 4"  (1.626 m), weight 129 lb 9.6 oz (58.786 kg), SpO2 100 %.  Intake/Output Summary (Last 24 hours) at 06/22/15 0934 Last data filed at 06/22/15 0800  Gross per 24 hour  Intake    240 ml  Output      0 ml  Net    240 ml    Telemetry: Junctional rhythm with 2.36 sec pauses.   GEN: No acute distress. Poor memory.  HEENT: Conjunctiva and lids normal, oropharynx clear with moist mucosa. Neck: Supple, no elevated JVP or carotid bruits, no thyromegaly. Lungs: Clear to auscultation, nonlabored breathing at rest. Cardiac: Regular rate and rhythm,harsh holosystolic murmur at the apex with radiation to the axilla, , no pericardial rub. Abdomen: Soft, nontender, no hepatomegaly, bowel sounds present, no guarding or rebound. Extremities: No pitting edema, distal pulses 2+. Skin: Warm and dry. Musculoskeletal: No kyphosis. Neuropsychiatric: Alert and oriented x3, affect grossly appropriate.Some issues with memory.   Prior Cardiac Testing/Procedures Echocardiogram: 01/08/2015. Left ventricle: Hyperdyanmic LV with small LVOT gradient. The cavity size was normal.  Wall thickness was increased in a pattern of moderate LVH. Systolic function was vigorous. The estimated ejection fraction was in the range of 65% to 70%. Doppler parameters are consistent with abnormal left ventricular relaxation (grade 1 diastolic dysfunction). - Aortic valve: There was trivial regurgitation. Valve area (VTI): 2.35 cm^2. Valve area (Vmax): 2.28 cm^2. Valve area (Vmean): 2.45 cm^2. - Mitral valve: Calcified annulus. There was mild regurgitation. - Left atrium: The atrium was moderately dilated. - Atrial septum: No defect or patent foramen ovale was identified. - Tricuspid valve: There was moderate regurgitation. - Pulmonary arteries: PA peak pressure: 44 mm Hg (S).  Carotid Ultrasound 01/27/2015 IMPRESSION: 1. No significant atherosclerotic plaque or evidence of stenosis on the right. 2. Trace atherosclerotic plaque in the left carotid bifurcation without internal carotid artery stenosis. 3. Vertebral arteries are patent with normal antegrade  flow.  Lab Results  Basic Metabolic Panel:  Recent Labs Lab 06/21/15 1400 06/22/15 0600  NA 147* 145  K 4.5 3.5  CL 112* 114*  CO2 27 24  GLUCOSE 149* 91  BUN 33* 29*  CREATININE 1.54* 1.06*  CALCIUM 10.4* 9.4  MG 2.8*  --     Liver Function Tests:  Recent Labs Lab 06/22/15 0600  AST 13*  ALT 9*  ALKPHOS 77  BILITOT 0.6  PROT 6.0*  ALBUMIN 3.4*    CBC:  Recent Labs Lab 06/21/15 1400 06/22/15 0600  WBC 8.0 8.0  NEUTROABS 4.6  --   HGB 12.4 11.0*  HCT 39.3 34.2*  MCV 90.3 89.1  PLT 336 275    Cardiac Enzymes:  Recent Labs Lab 06/21/15 1400 06/22/15 0005 06/22/15 0600  TROPONINI <0.03 <0.03 <0.03    Radiology: Dg Chest 2 View  06/21/2015  CLINICAL DATA:  Chronic weakness and dizziness over the past year. EXAM: CHEST  2 VIEW COMPARISON:  PA and lateral chest x-ray of December 20, 2009 FINDINGS: The lungs are adequately inflated and clear. The heart and pulmonary vascularity are  normal. The mediastinum is normal in width. There is no pleural effusion or pneumothorax. There is mild tortuosity of the descending thoracic aorta. There is multilevel degenerative disc disease of the thoracic spine. There is degenerative change of both shoulders. IMPRESSION: There is no active cardiopulmonary disease. Electronically Signed   By: David  Martinique M.D.   On: 06/21/2015 15:48     ECG: Junctional rhythm rate of 41 bpm.    Impression and Recommendations  1. Symptomatic Bradycardia with junctional rhythm: She was on lopressor 25 mg daily at home. States that she feels dizzy when standing up. Review of telemetry demonstrates two episodes of 2.3 sec pauses. Agree with stopping metoprolol. Wait for "wash out" before considering PPM. I talked to her about the possibility and she is against this unless absolutely necessary. Check TSH. Troponin is negative X 3 ruling out ACS as cause of bradycardia or syncope.   2.  Hypokalemia: Potassium 3.5 this am. Will give replacement.   3. Hx of Hypertension:  BP currently controlled on hydralazine. 25mg  BID and amlodipine. .Lisinopril is on hold .  4. Hx of CVA: Some memory issues and mild dementia, but appears lucid on evaluation. Continues on  DAPT.   Signed: Phill Myron. Lawrence NP [place]  06/22/2015, 9:34 AM Co-Sign MD  The patient was seen and examined, and I agree with the assessment and plan as documented above, with modifications as noted below. Pt admitted with near syncope, bradycardia, weakness, hypotension and dizziness. Also noted to be prerenal with four episodes of diarrhea. Has been given IV fluids and BUN/creatinine ratio has improved. ECG showed junctional rhythm, HR 43 bpm. When I evaluated her in clinic on 07/25/13, I documented her h/o bradycardia and noted she should not be on beta blockers. However, these were reinitiated earlier this year as noted above. Currently feeling well.  Echocardiogram in 12/2014 demonstrated vigorous  LV systolic function, moderate LVH, very mild LVOT gradient, and grade I diastolic dysfunction, with mild MR and moderate TR (mildly elevated PASP 44 mmHg).  Agree with stopping metoprolol altogether. Should not be on any form of AV nodal blocking agents moving forwards. Refuses to wear any event monitor in near future. Last one in 04/2013 showed sinus bradycardia without pauses.  Will arrange for her to f/u in clinic next week.   Kate Sable, MD, [facility]  06/22/2015 12:06  PM   

## 2015-06-23 DIAGNOSIS — I10 Essential (primary) hypertension: Secondary | ICD-10-CM | POA: Diagnosis not present

## 2015-06-23 DIAGNOSIS — R55 Syncope and collapse: Secondary | ICD-10-CM | POA: Diagnosis not present

## 2015-06-23 DIAGNOSIS — I498 Other specified cardiac arrhythmias: Secondary | ICD-10-CM | POA: Diagnosis not present

## 2015-06-23 DIAGNOSIS — R001 Bradycardia, unspecified: Secondary | ICD-10-CM | POA: Diagnosis not present

## 2015-06-23 LAB — BASIC METABOLIC PANEL
Anion gap: 8 (ref 5–15)
BUN: 20 mg/dL (ref 6–20)
CHLORIDE: 114 mmol/L — AB (ref 101–111)
CO2: 24 mmol/L (ref 22–32)
Calcium: 10.1 mg/dL (ref 8.9–10.3)
Creatinine, Ser: 1.02 mg/dL — ABNORMAL HIGH (ref 0.44–1.00)
GFR calc Af Amer: 57 mL/min — ABNORMAL LOW (ref >60)
GFR calc non Af Amer: 49 mL/min — ABNORMAL LOW (ref >60)
GLUCOSE: 92 mg/dL (ref 65–99)
POTASSIUM: 3.8 mmol/L (ref 3.5–5.1)
SODIUM: 146 mmol/L — AB (ref 135–145)

## 2015-06-23 LAB — TSH: TSH: 2.084 u[IU]/mL (ref 0.350–4.500)

## 2015-06-23 MED ORDER — CLOPIDOGREL BISULFATE 75 MG PO TABS: 75.0000 mg | ORAL_TABLET | Freq: Every day | ORAL | Status: DC

## 2015-06-23 NOTE — Care Management Note (Signed)
Case Management Note  Patient Details  Name: Jodi Peters MRN: [id number] Date of Birth: 1930/03/05  Subjective/Objective:                    Action/Plan:   Expected Discharge Date:                  Expected Discharge Plan:  Home/Self Care  In-House Referral:  NA  Discharge planning Services  CM Consult  Post Acute Care Choice:  NA Choice offered to:  NA  DME Arranged:    DME Agency:     HH Arranged:    HH Agency:     Status of Service:  Completed, signed off  Medicare Important Message Given:    Date Medicare IM Given:    Medicare IM give by:    Date Additional Medicare IM Given:    Additional Medicare Important Message give by:     If discussed at [place] of Stay Meetings, dates discussed:    Additional Comments: Pt discharged home today. No CM needs noted. Christinia Gully [place], RN 06/23/2015, 9:01 AM

## 2015-06-23 NOTE — Discharge Summary (Signed)
Physician Discharge Summary  Patient ID: Jodi Peters MRN: [id number] DOB/AGE: Nov 09, 1929 82 y.o. Primary Care Physician:Breah Joa, MD Admit date: 06/21/2015 Discharge date: 06/23/2015    Discharge Diagnoses:   Active Problems:   Glaucoma   Cerebrovascular disease   Essential hypertension   Near syncope     Medication List    STOP taking these medications        diphenhydramine-acetaminophen 25-500 MG Tabs tablet  Commonly known as:  TYLENOL PM     furosemide 20 MG tablet  Commonly known as:  LASIX     hydrALAZINE 25 MG tablet  Commonly known as:  APRESOLINE     lisinopril 40 MG tablet  Commonly known as:  PRINIVIL,ZESTRIL     metoprolol tartrate 25 MG tablet  Commonly known as:  LOPRESSOR      TAKE these medications        acetaminophen 500 MG tablet  Commonly known as:  TYLENOL  Take 500 mg by mouth every 6 (six) hours as needed for mild pain or moderate pain.     amLODipine 10 MG tablet  Commonly known as:  NORVASC  Take 1 tablet (10 mg total) by mouth daily.     brimonidine 0.2 % ophthalmic solution  Commonly known as:  ALPHAGAN  Place 1 drop into both eyes 2 (two) times daily.     clopidogrel 75 MG tablet  Commonly known as:  PLAVIX  Take 1 tablet (75 mg total) by mouth daily.     dorzolamide-timolol 22.3-6.8 MG/ML ophthalmic solution  Commonly known as:  COSOPT  1 drop 2 (two) times daily.     GAS-X PO  Take 1 tablet by mouth daily as needed (for gas relief).     latanoprost 0.005 % ophthalmic solution  Commonly known as:  XALATAN  Place 1 drop into both eyes at bedtime.     mirtazapine 15 MG tablet  Commonly known as:  REMERON  Take 15 mg by mouth at bedtime.     pilocarpine 4 % ophthalmic solution  Commonly known as:  PILOCAR  Place 1 drop into both eyes at bedtime.     SYSTANE OP  Apply 1 drop to eye as needed (for dry eye relief).     Vitamin D3 5000 units Caps  Take 1 capsule by mouth daily.        Discharged Condition:  improved    Consults: cardiology  Significant Diagnostic Studies: Dg Chest 2 View  06/21/2015  CLINICAL DATA:  Chronic weakness and dizziness over the past year. EXAM: CHEST  2 VIEW COMPARISON:  PA and lateral chest x-ray of December 20, 2009 FINDINGS: The lungs are adequately inflated and clear. The heart and pulmonary vascularity are normal. The mediastinum is normal in width. There is no pleural effusion or pneumothorax. There is mild tortuosity of the descending thoracic aorta. There is multilevel degenerative disc disease of the thoracic spine. There is degenerative change of both shoulders. IMPRESSION: There is no active cardiopulmonary disease. Electronically Signed   By: David  Martinique M.D.   On: 06/21/2015 15:48    Lab Results: Basic Metabolic Panel:  Recent Labs  06/21/15 1400 06/22/15 0600 06/23/15 0657  NA 147* 145 146*  K 4.5 3.5 3.8  CL 112* 114* 114*  CO2 27 24 24   GLUCOSE 149* 91 92  BUN 33* 29* 20  CREATININE 1.54* 1.06* 1.02*  CALCIUM 10.4* 9.4 10.1  MG 2.8*  --   --    Liver Function Tests:  Recent Labs  06/22/15 0600  AST 13*  ALT 9*  ALKPHOS 77  BILITOT 0.6  PROT 6.0*  ALBUMIN 3.4*     CBC:  Recent Labs  06/21/15 1400 06/22/15 0600  WBC 8.0 8.0  NEUTROABS 4.6  --   HGB 12.4 11.0*  HCT 39.3 34.2*  MCV 90.3 89.1  PLT 336 275    No results found for this or any previous visit (from the past 240 hour(s)).   Hospital Course:   This is an 82 years old female who was admitted due to near-syncopal episode. She was evaluated by cardiology. Her lopressor was stopped. She had serial EKFG and cardiac enzymes which was negative for ACS. Patient improved and discharged in stable condition.  Discharge Exam: Blood pressure 133/65, pulse 66, temperature 98.2 F (36.8 C), temperature source Oral, resp. rate 20, height 5\' 4"  (1.626 m), weight 58.786 kg (129 lb 9.6 oz), SpO2 100 %.   Disposition:  home        Follow-up Information    Follow up with  [name], NP On 07/02/2015.   Specialties:  Nurse Practitioner, Radiology, Cardiology   Why:  1 pm   Contact information:   [address] [place] [address] [phone]       Follow up with [facility], MD In 1 week.   Specialty:  Internal Medicine   Contact information:   [place] [address] [phone]       Signed: Rosita Fire   06/23/2015, 8:32 AM

## 2015-06-23 NOTE — Progress Notes (Signed)
Subjective:  Hoping to go home. Up urinating all night. No cardiac complaints.  Objective:  Vital Signs in the last 24 hours: Temp:  [98.2 F (36.8 C)-98.5 F (36.9 C)] 98.2 F (36.8 C) (01/11 [id number]) Pulse Rate:  [54-66] 66 (01/11 0652) Resp:  [20] 20 (01/11 0652) BP: (128-134)/(50-67) 133/65 mmHg (01/11 0652) SpO2:  [100 %] 100 % (01/11 0652)  Intake/Output from previous day: 01/10 0701 - 01/11 0700 In: 720 [P.O.:720] Out: -  Intake/Output from this shift:    Physical Exam: NECK: Without JVD, HJR, or bruit LUNGS: Clear anterior, posterior, lateral HEART: regular rate and rhythm, I/VI systolic murmur LSB,no gallop, rub, bruit, thrill, or heave EXTREMITIES: Without cyanosis, clubbing, or edema   Lab Results:  Recent Labs  06/21/15 1400 06/22/15 0600  WBC 8.0 8.0  HGB 12.4 11.0*  PLT 336 275    Recent Labs  06/22/15 0600 06/23/15 0657  NA 145 146*  K 3.5 3.8  CL 114* 114*  CO2 24 24  GLUCOSE 91 92  BUN 29* 20  CREATININE 1.06* 1.02*    Recent Labs  06/22/15 0005 06/22/15 0600  TROPONINI <0.03 <0.03   Hepatic Function Panel  Recent Labs  06/22/15 0600  PROT 6.0*  ALBUMIN 3.4*  AST 13*  ALT 9*  ALKPHOS 77  BILITOT 0.6   No results for input(s): CHOL in the last 72 hours. No results for input(s): PROTIME in the last 72 hours.    Cardiac Studies: Echocardiogram: 01/08/2015. Left ventricle: Hyperdyanmic LV with small LVOT gradient. The   cavity size was normal. Wall thickness was increased in a pattern   of moderate LVH. Systolic function was vigorous. The estimated   ejection fraction was in the range of 65% to 70%. Doppler   parameters are consistent with abnormal left ventricular   relaxation (grade 1 diastolic dysfunction). - Aortic valve: There was trivial regurgitation. Valve area (VTI):   2.35 cm^2. Valve area (Vmax): 2.28 cm^2. Valve area (Vmean): 2.45   cm^2. - Mitral valve: Calcified annulus. There was mild regurgitation. - Left  atrium: The atrium was moderately dilated. - Atrial septum: No defect or patent foramen ovale was identified. - Tricuspid valve: There was moderate regurgitation. - Pulmonary arteries: PA peak pressure: 44 mm Hg (S).  Carotid Ultrasound 01/27/2015 IMPRESSION: 1. No significant atherosclerotic plaque or evidence of stenosis on the right. 2. Trace atherosclerotic plaque in the left carotid bifurcation without internal carotid artery stenosis. 3. Vertebral arteries are patent with normal antegrade flow.   Assessment/Plan:   Syncope with Symptomatic Bradycardia with junctional rhythm: Also prerenal with diarrhea. She was on lopressor 25 mg daily at home with two episodes of 2.3 sec pauses. Should not be on beta blockers. Now NSR with short pauses.Refuses event monitor in future. F/u with Dr. Bronson Ing. Will arrange.    Hx of Hypertension:  BP currently controlled on hydralazine. 25mg  BID and amlodipine. .Lisinopril is on hold .Crt.improved from 1.54 to 1.02 with IV fluids.   Hx of CVA: Continues on  DAPT.        Ermalinda Barrios 06/23/2015, 7:52 AM   The patient was seen, and I agree with the assessment and plan as documented above. Stable for discharge today. Asymptomatic. Should not be on any form of AV nodal blocking agents moving forwards. Refuses to wear any event monitor in near future. Last one in 04/2013 showed sinus bradycardia without pauses. Will f/u in clinic.  Kate Sable, MD, [facility]  06/23/2015 10:27 AM

## 2015-06-24 DIAGNOSIS — R001 Bradycardia, unspecified: Secondary | ICD-10-CM | POA: Diagnosis not present

## 2015-06-24 DIAGNOSIS — R55 Syncope and collapse: Secondary | ICD-10-CM | POA: Diagnosis not present

## 2015-07-01 DIAGNOSIS — I10 Essential (primary) hypertension: Secondary | ICD-10-CM | POA: Diagnosis not present

## 2015-07-01 DIAGNOSIS — E784 Other hyperlipidemia: Secondary | ICD-10-CM | POA: Diagnosis not present

## 2015-07-01 DIAGNOSIS — I499 Cardiac arrhythmia, unspecified: Secondary | ICD-10-CM | POA: Diagnosis not present

## 2015-07-01 DIAGNOSIS — I498 Other specified cardiac arrhythmias: Secondary | ICD-10-CM | POA: Diagnosis not present

## 2015-07-01 DIAGNOSIS — M549 Dorsalgia, unspecified: Secondary | ICD-10-CM | POA: Diagnosis not present

## 2015-07-02 ENCOUNTER — Encounter: Payer: Medicare Other | Admitting: Adult Health

## 2015-07-05 ENCOUNTER — Encounter: Payer: Self-pay | Admitting: Adult Health

## 2015-07-05 ENCOUNTER — Ambulatory Visit (INDEPENDENT_AMBULATORY_CARE_PROVIDER_SITE_OTHER): Payer: Medicare Other | Admitting: Adult Health

## 2015-07-05 VITALS — BP 176/70 | HR 51 | Ht 62.0 in | Wt 134.0 lb

## 2015-07-05 DIAGNOSIS — R42 Dizziness and giddiness: Secondary | ICD-10-CM | POA: Diagnosis not present

## 2015-07-05 DIAGNOSIS — I10 Essential (primary) hypertension: Secondary | ICD-10-CM

## 2015-07-05 DIAGNOSIS — R001 Bradycardia, unspecified: Secondary | ICD-10-CM | POA: Diagnosis not present

## 2015-07-05 NOTE — Progress Notes (Deleted)
Name: Jodi Peters    DOB: 1930/01/25  Age: 82 y.o.  MR#: [id number]       PCP:  Rosita Fire, MD      Insurance: Payor: [employer] MEDICARE / Plan: [facility] MEDICARE / Product Type: *No Product type* /   CC:   No chief complaint on file.   VS Filed Vitals:   07/05/15 1517  BP: 176/70  Pulse: 51  Height: 5\' 2"  (1.575 m)  Weight: 134 lb (60.782 kg)  SpO2: 96%    Weights Current Weight  07/05/15 134 lb (60.782 kg)  06/21/15 129 lb 9.6 oz (58.786 kg)  01/26/15 149 lb (67.586 kg)    Blood Pressure  BP Readings from Last 3 Encounters:  07/05/15 176/70  06/23/15 133/65  01/27/15 132/58     Admit date:  (Not on file) Last encounter with RMR:  Visit date not found   Allergy Penicillins  Current Outpatient Prescriptions  Medication Sig Dispense Refill  . acetaminophen (TYLENOL) 500 MG tablet Take 500 mg by mouth every 6 (six) hours as needed for mild pain or moderate pain.    Marland Kitchen amLODipine (NORVASC) 10 MG tablet Take 1 tablet (10 mg total) by mouth daily. 30 tablet 0  . brimonidine (ALPHAGAN) 0.2 % ophthalmic solution Place 1 drop into both eyes 2 (two) times daily.    . Cholecalciferol (VITAMIN D3) 5000 units CAPS Take 1 capsule by mouth daily.    . clopidogrel (PLAVIX) 75 MG tablet Take 1 tablet (75 mg total) by mouth daily. 30 tablet 3  . dorzolamide-timolol (COSOPT) 22.3-6.8 MG/ML ophthalmic solution 1 drop 2 (two) times daily.      Marland Kitchen latanoprost (XALATAN) 0.005 % ophthalmic solution Place 1 drop into both eyes at bedtime.    . mirtazapine (REMERON) 15 MG tablet Take 15 mg by mouth at bedtime.    . pilocarpine (PILOCAR) 4 % ophthalmic solution Place 1 drop into both eyes at bedtime.    [name] Glycol-Propyl Glycol (SYSTANE OP) Apply 1 drop to eye as needed (for dry eye relief).     . Simethicone (GAS-X PO) Take 1 tablet by mouth daily as needed (for gas relief).      No current facility-administered medications for this visit.    Discontinued Meds:   There are no  discontinued medications.  Patient Active Problem List   Diagnosis Date Noted  . Near syncope 06/21/2015  . Pain in joint, shoulder region 02/05/2015  . TIA (transient ischemic attack) 01/26/2015  . Leukocytosis 01/18/2015  . UTI (urinary tract infection) 12/29/2014  . Spinal stenosis of thoracic region   . Spinal stenosis 12/22/2014  . Spinal cord compression ([place]) 12/22/2014  . Essential hypertension 12/22/2014  . Spinal stenosis of lumbar region   . Rectocele 06/30/2014  . Cystocele 06/30/2014  . HERPES ZOSTER 01/14/2010  . MITRAL REGURGITATION 01/14/2010  . Cerebrovascular disease 01/14/2010  . INSOMNIA 12/23/2008  . OSTEOPENIA 06/01/2008  . BRADYCARDIA 01/27/2008  . INSUFFICIENCY, VENOUS NOS 01/10/2007  . ALLERGIC RHINITIS, SEASONAL 10/10/2006  . SEBORRHEIC KERATOSIS 08/08/2006  . HYPERLIPIDEMIA 05/23/2006  . Anxiety state 05/23/2006  . CARPAL TUNNEL SYNDROME 05/23/2006  . Glaucoma 05/23/2006  . CATARACT NOS 05/23/2006  . HYPERTENSION 05/23/2006  . CONSTIPATION 05/23/2006  . IBS 05/23/2006  . OSTEOARTHRITIS 05/23/2006  . LOW BACK PAIN 05/23/2006  . BREAST CANCER, HX OF 05/23/2006    LABS    Component Value Date/Time   NA 146* 06/23/2015 0657   NA 145 06/22/2015 0600  NA 147* 06/21/2015 1400   K 3.8 06/23/2015 0657   K 3.5 06/22/2015 0600   K 4.5 06/21/2015 1400   CL 114* 06/23/2015 0657   CL 114* 06/22/2015 0600   CL 112* 06/21/2015 1400   CO2 24 06/23/2015 0657   CO2 24 06/22/2015 0600   CO2 27 06/21/2015 1400   GLUCOSE 92 06/23/2015 0657   GLUCOSE 91 06/22/2015 0600   GLUCOSE 149* 06/21/2015 1400   BUN 20 06/23/2015 0657   BUN 29* 06/22/2015 0600   BUN 33* 06/21/2015 1400   CREATININE 1.02* 06/23/2015 0657   CREATININE 1.06* 06/22/2015 0600   CREATININE 1.54* 06/21/2015 1400   CALCIUM 10.1 06/23/2015 0657   CALCIUM 9.4 06/22/2015 0600   CALCIUM 10.4* 06/21/2015 1400   GFRNONAA 49* 06/23/2015 0657   GFRNONAA 47* 06/22/2015 0600   GFRNONAA 30*  06/21/2015 1400   GFRAA 57* 06/23/2015 0657   GFRAA 55* 06/22/2015 0600   GFRAA 35* 06/21/2015 1400   CMP     Component Value Date/Time   NA 146* 06/23/2015 0657   K 3.8 06/23/2015 0657   CL 114* 06/23/2015 0657   CO2 24 06/23/2015 0657   GLUCOSE 92 06/23/2015 0657   BUN 20 06/23/2015 0657   CREATININE 1.02* 06/23/2015 0657   CALCIUM 10.1 06/23/2015 0657   PROT 6.0* 06/22/2015 0600   ALBUMIN 3.4* 06/22/2015 0600   AST 13* 06/22/2015 0600   ALT 9* 06/22/2015 0600   ALKPHOS 77 06/22/2015 0600   BILITOT 0.6 06/22/2015 0600   GFRNONAA 49* 06/23/2015 0657   GFRAA 57* 06/23/2015 0657       Component Value Date/Time   WBC 8.0 06/22/2015 0600   WBC 8.0 06/21/2015 1400   WBC 6.6 02/01/2015 0630   HGB 11.0* 06/22/2015 0600   HGB 12.4 06/21/2015 1400   HGB 10.0* 02/01/2015 0630   HCT 34.2* 06/22/2015 0600   HCT 39.3 06/21/2015 1400   HCT 31.0* 02/01/2015 0630   MCV 89.1 06/22/2015 0600   MCV 90.3 06/21/2015 1400   MCV 93.7 02/01/2015 0630    Lipid Panel     Component Value Date/Time   CHOL 184 02/23/2015 0920   TRIG 90 02/23/2015 0920   HDL 59 02/23/2015 0920   CHOLHDL 3.1 02/23/2015 0920   VLDL 18 02/23/2015 0920   LDLCALC 107* 02/23/2015 0920    ABG    Component Value Date/Time   TCO2 22 01/26/2015 1528     Lab Results  Component Value Date   TSH 2.084 06/23/2015   BNP (last 3 results) No results for input(s): BNP in the last 8760 hours.  ProBNP (last 3 results) No results for input(s): PROBNP in the last 8760 hours.  Cardiac Panel (last 3 results) No results for input(s): CKTOTAL, CKMB, TROPONINI, RELINDX in the last 72 hours.  Iron/TIBC/Ferritin/ %Sat No results found for: IRON, TIBC, FERRITIN, IRONPCTSAT   EKG Orders placed or performed during the hospital encounter of 06/21/15  . ED EKG  . ED EKG  . EKG 12-Lead  . EKG 12-Lead  . EKG     Prior Assessment and Plan Problem List as of 07/05/2015      Cardiovascular and Mediastinum   MITRAL  REGURGITATION   HYPERTENSION   Last Assessment & Plan 04/01/2013 Office Visit Written 04/01/2013  5:45 PM by Lendon Colonel, NP     Blood pressure is elevated initially. On triage however her blood pressure was 147/65. One static blood pressures were completed in the office.  Lying 171/68, sitting 167/69, standing 147/65. As stated a low her heart rate did drop from 67-40 from lying to standing position.  I have reviewed this with Dr. Pennelope Bracken, on site, to evaluate need to adjust current medication regimen which includes amlodipine and benazepril. Dr. Legrand Rams stop her chlorthalidone on recent office visit. It is his advisement that we keep her on her current medication regimen and follow her cardiac monitor on current medications. He will see her in the office on followup in approximately 2 weeks to one month.      BRADYCARDIA   Last Assessment & Plan 04/01/2013 Office Visit Written 04/01/2013  5:44 PM by Lendon Colonel, NP    The patient had orthostatic blood pressures completed and was found to be bradycardic with position change. Lying heart rate was 67, sitting heart rate 50, standing heart rate 40 beats per minute. The patient had some dizziness associated with position change. I will plan a CardioNet monitor for the next 10 days. This will evaluate her heart rate throughout the day, with advisement to right symptoms down in a law which is attached. She is advised not to take positions quickly. She is to take her time getting up or lying or sitting position. She verbalizes understanding.      Cerebrovascular disease   INSUFFICIENCY, VENOUS NOS   Essential hypertension   TIA (transient ischemic attack)   Near syncope     Respiratory   ALLERGIC RHINITIS, SEASONAL     Digestive   CONSTIPATION   IBS   Rectocele     Nervous and Auditory   CARPAL TUNNEL SYNDROME   Spinal cord compression (HCC)     Musculoskeletal and Integument   SEBORRHEIC KERATOSIS   OSTEOARTHRITIS    OSTEOPENIA     Genitourinary   Cystocele   UTI (urinary tract infection)     Other   HERPES ZOSTER   HYPERLIPIDEMIA   Anxiety state   Glaucoma   CATARACT NOS   LOW BACK PAIN   INSOMNIA   BREAST CANCER, HX OF   Spinal stenosis   Spinal stenosis of lumbar region   Spinal stenosis of thoracic region   Leukocytosis   Pain in joint, shoulder region       Imaging: Dg Chest 2 View  06/21/2015  CLINICAL DATA:  Chronic weakness and dizziness over the past year. EXAM: CHEST  2 VIEW COMPARISON:  PA and lateral chest x-ray of December 20, 2009 FINDINGS: The lungs are adequately inflated and clear. The heart and pulmonary vascularity are normal. The mediastinum is normal in width. There is no pleural effusion or pneumothorax. There is mild tortuosity of the descending thoracic aorta. There is multilevel degenerative disc disease of the thoracic spine. There is degenerative change of both shoulders. IMPRESSION: There is no active cardiopulmonary disease. Electronically Signed   By: David  Martinique M.D.   On: 06/21/2015 15:48

## 2015-07-05 NOTE — Patient Instructions (Signed)
Medication Instructions:  Your physician recommends that you continue on your current medications as directed. Please refer to the Current Medication list given to you today.   Labwork: NONE  Testing/Procedures: Your physician has recommended that you wear a holter monitor. Holter monitors are medical devices that record the heart's electrical activity. Doctors most often use these monitors to diagnose arrhythmias. Arrhythmias are problems with the speed or rhythm of the heartbeat. The monitor is a small, portable device. You can wear one while you do your normal daily activities. This is usually used to diagnose what is causing palpitations/syncope (passing out).  *42 HOURS  Follow-Up: Your physician recommends that you schedule a follow-up appointment in: 2 WEEKS     Any Other Special Instructions Will Be Listed Below (If Applicable).     If you need a refill on your cardiac medications before your next appointment, please call your pharmacy.

## 2015-07-05 NOTE — Progress Notes (Signed)
Cardiology Office Note   Date:  07/05/2015   ID:  JESELLE KOSAKOWSKI, DOB 03-18-1930, MRN [id number]  PCP:  Rosita Fire, MD  Cardiologist: Woodroe Chen, NP   No chief complaint on file.     History of Present Illness: Jodi Peters is a 82 y.o. 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 She was placed on DAPT with ASA and Plavix. 01/2015. Due to bradycardia history she was taken off of BB, but on review of home medications, she is on metoprolol 25 mg daily. She was seen during recent hospitalization on consultation for bradycardia. She was again taken off of BB.   She states that she is still having daily dizziness, especially in the morning. No near syncope. She has stopped the BB as directed. She states she usually feels it within an hour of taking her medications. No chest pain.   Past Medical History  Diagnosis Date  . Hyperlipidemia   . Hypertension   . Mitral regurgitation     sinus bradycardia  . Breast pain     leftr  . Venous insufficiency   . Allergic rhinitis   . Seborrheic keratosis   . Breast mass     left  . Vertigo   . Carpal tunnel syndrome   . Cataract   . Glaucoma   . Constipation   . IBS (irritable bowel syndrome)   . Osteopenia   . Osteoarthritis   . Low back pain   . Breast cancer ([place])   . Anxiety   . Vitamin D deficiency   . Bradycardia   . Carotid atherosclerosis   . Rectocele 06/30/2014  . Cystocele 06/30/2014    Past Surgical History  Procedure Laterality Date  . Appendectomy    . Cholecystectomy    . Keloid      left ear  . Eye surgery      for bleed in left eye  . Cyst removed from left breast    . Spurs on back    . Back surgery    . Abdominal hysterectomy    . Lumbar laminectomy/decompression microdiscectomy N/A 12/25/2014    Procedure: Thoracic  eleven-twelve Laminectomy/Diskectomy; Lumbar three-four Laminectomy/Diskectomy;  Surgeon: Kristeen Miss, MD;  Location: [facility];  Service: Neurosurgery;  Laterality: N/A;  . Breast lumpectomy       Current Outpatient Prescriptions  Medication Sig Dispense Refill  . acetaminophen (TYLENOL) 500 MG tablet Take 500 mg by mouth every 6 (six) hours as needed for mild pain or moderate pain.    Marland Kitchen amLODipine (NORVASC) 10 MG tablet Take 1 tablet (10 mg total) by mouth daily. 30 tablet 0  . brimonidine (ALPHAGAN) 0.2 % ophthalmic solution Place 1 drop into both eyes 2 (two) times daily.    . Cholecalciferol (VITAMIN D3) 5000 units CAPS Take 1 capsule by mouth daily.    . clopidogrel (PLAVIX) 75 MG tablet Take 1 tablet (75 mg total) by mouth daily. 30 tablet 3  . dorzolamide-timolol (COSOPT) 22.3-6.8 MG/ML ophthalmic solution 1 drop 2 (two) times daily.      Marland Kitchen latanoprost (XALATAN) 0.005 % ophthalmic solution Place 1 drop into both eyes at bedtime.    . mirtazapine (REMERON) 15 MG tablet Take 15 mg by mouth at bedtime.    . pilocarpine (PILOCAR) 4 % ophthalmic solution Place 1 drop into both  eyes at bedtime.    [name] Glycol-Propyl Glycol (SYSTANE OP) Apply 1 drop to eye as needed (for dry eye relief).     . Simethicone (GAS-X PO) Take 1 tablet by mouth daily as needed (for gas relief).      No current facility-administered medications for this visit.    Allergies:   Penicillins    Social History:  The patient  reports that she has never smoked. She has never used smokeless tobacco. She reports that she does not drink alcohol or use illicit drugs.   Family History:  The patient's family history includes Diabetes in her mother; Heart disease in her maternal grandmother and mother; Hyperlipidemia in her son; Hypertension in her maternal grandmother, mother, son, and son; Other in her daughter and son.    ROS: All other systems are reviewed and negative. Unless otherwise mentioned in H&P     PHYSICAL EXAM: VS:  BP 176/70 mmHg  Pulse 51  Ht 5\' 2"  (1.575 m)  Wt 134 lb (60.782 kg)  BMI 24.50 kg/m2  SpO2 96% , BMI Body mass index is 24.5 kg/(m^2). GEN: Well nourished, well developed, in no acute distress HEENT: normal Neck: no JVD, carotid bruits, or masses Cardiac: RRR; with frequent extra systole,  no murmurs, rubs, or gallops,no edema  Respiratory:  Clear to auscultation bilaterally, normal work of breathing GI: soft, nontender, nondistended, + BS MS: no deformity or atrophyLypoma of the left ear.  Skin: warm and dry, no rash Neuro:  Strength and sensation are intact Psych: euthymic mood, full affect   EKG:  The ekg ordered today demonstrates NSR with no evidence of heart block . Rate of 64 bpm.    Recent Labs: 06/21/2015: Magnesium 2.8* 06/22/2015: ALT 9*; Hemoglobin 11.0*; Platelets 275 06/23/2015: BUN 20; Creatinine, Ser 1.02*; Potassium 3.8; Sodium 146*; TSH 2.084    Lipid Panel    Component Value Date/Time   CHOL 184 02/23/2015 0920   TRIG 90 02/23/2015 0920   HDL 59 02/23/2015 0920   CHOLHDL 3.1 02/23/2015 0920   VLDL 18 02/23/2015 0920   LDLCALC 107* 02/23/2015 0920      Wt Readings from Last 3 Encounters:  07/05/15 134 lb (60.782 kg)  06/21/15 129 lb 9.6 oz (58.786 kg)  01/26/15 149 lb (67.586 kg)    ASSESSMENT AND PLAN:  1. Bradycardia: She is no longer on BB. However, she is still mildly bradycardic with symptoms of dizziness in the am after taking her medications. Uncertain of it is a result of the medications or continued symptomatic bradycardia. I will place a 48 hour Holter monitor on her to check HR at home.   2. Hypertension; She is on amlodipine 10 mg daily in the am. I had orthostatics completed in the office today and she was negative, with BP 142/78 to 136/78. She was asymptomatic. Will consider changing her dosing on follow up if Holter is negative.      Current medicines are reviewed at length with the patient today.    Labs/  tests ordered today include: Holter monitor  Orders Placed This Encounter  Procedures  . Holter monitor - 48 hour  . EKG 12-Lead     Disposition:   FU with 2 weeks.   Signed, [name], NP  07/05/2015 4:18 PM    [facility] [address], [place], [place] [address] Phone: [phone]; Fax: [phone]

## 2015-07-07 ENCOUNTER — Ambulatory Visit (HOSPITAL_COMMUNITY): Admission: RE | Admit: 2015-07-07 | Payer: Medicare Other | Source: Ambulatory Visit

## 2015-07-14 ENCOUNTER — Ambulatory Visit (HOSPITAL_COMMUNITY)
Admission: RE | Admit: 2015-07-14 | Discharge: 2015-07-14 | Disposition: A | Discharge status: Home / Self Care | Payer: Medicare Other | Source: Ambulatory Visit | Attending: Adult Health | Admitting: Adult Health

## 2015-07-14 DIAGNOSIS — R42 Dizziness and giddiness: Secondary | ICD-10-CM | POA: Insufficient documentation

## 2015-07-14 DIAGNOSIS — R001 Bradycardia, unspecified: Secondary | ICD-10-CM | POA: Insufficient documentation

## 2015-07-19 ENCOUNTER — Ambulatory Visit: Payer: Medicare Other | Admitting: Adult Health

## 2015-07-20 ENCOUNTER — Telehealth: Payer: Self-pay | Admitting: *Deleted

## 2015-07-20 DIAGNOSIS — R001 Bradycardia, unspecified: Secondary | ICD-10-CM

## 2015-07-20 NOTE — Telephone Encounter (Signed)
-----   Message from Lendon Colonel, NP sent at 07/19/2015 12:43 PM EST ----- Please refer this nice lady to Dr. Lovena Le for significant bradycardia with pauses. She will need to consider a PPM>

## 2015-07-23 ENCOUNTER — Ambulatory Visit: Payer: Medicare Other | Admitting: Adult Health

## 2015-07-29 DIAGNOSIS — R42 Dizziness and giddiness: Secondary | ICD-10-CM | POA: Diagnosis not present

## 2015-07-29 DIAGNOSIS — R001 Bradycardia, unspecified: Secondary | ICD-10-CM | POA: Diagnosis not present

## 2015-07-29 DIAGNOSIS — I10 Essential (primary) hypertension: Secondary | ICD-10-CM | POA: Diagnosis not present

## 2015-07-29 DIAGNOSIS — M4800 Spinal stenosis, site unspecified: Secondary | ICD-10-CM | POA: Diagnosis not present

## 2015-08-03 DIAGNOSIS — H401133 Primary open-angle glaucoma, bilateral, severe stage: Secondary | ICD-10-CM | POA: Diagnosis not present

## 2015-08-03 DIAGNOSIS — H25811 Combined forms of age-related cataract, right eye: Secondary | ICD-10-CM | POA: Diagnosis not present

## 2015-08-03 DIAGNOSIS — Z961 Presence of intraocular lens: Secondary | ICD-10-CM | POA: Diagnosis not present

## 2015-08-09 DIAGNOSIS — M549 Dorsalgia, unspecified: Secondary | ICD-10-CM | POA: Diagnosis not present

## 2015-08-09 DIAGNOSIS — G9529 Other cord compression: Secondary | ICD-10-CM | POA: Diagnosis not present

## 2015-08-09 DIAGNOSIS — E784 Other hyperlipidemia: Secondary | ICD-10-CM | POA: Diagnosis not present

## 2015-08-09 DIAGNOSIS — I10 Essential (primary) hypertension: Secondary | ICD-10-CM | POA: Diagnosis not present

## 2015-08-17 DIAGNOSIS — H401133 Primary open-angle glaucoma, bilateral, severe stage: Secondary | ICD-10-CM | POA: Diagnosis not present

## 2015-08-20 ENCOUNTER — Ambulatory Visit (INDEPENDENT_AMBULATORY_CARE_PROVIDER_SITE_OTHER): Payer: Medicare Other | Admitting: Internal Medicine

## 2015-08-20 ENCOUNTER — Telehealth: Payer: Self-pay

## 2015-08-20 ENCOUNTER — Encounter: Payer: Self-pay | Admitting: *Deleted

## 2015-08-20 ENCOUNTER — Encounter: Payer: Self-pay | Admitting: Internal Medicine

## 2015-08-20 ENCOUNTER — Ambulatory Visit: Payer: Medicare Other | Admitting: Internal Medicine

## 2015-08-20 VITALS — BP 144/76 | HR 75 | Ht 64.5 in | Wt 129.0 lb

## 2015-08-20 DIAGNOSIS — Z01818 Encounter for other preprocedural examination: Secondary | ICD-10-CM

## 2015-08-20 DIAGNOSIS — I498 Other specified cardiac arrhythmias: Secondary | ICD-10-CM | POA: Diagnosis not present

## 2015-08-20 NOTE — Patient Instructions (Signed)
Your physician has recommended that you have a pacemaker inserted. A pacemaker is a small device that is placed under the skin of your chest or abdomen to help control abnormal heart rhythms. This device uses electrical pulses to prompt the heart to beat at a normal rate. Pacemakers are used to treat heart rhythms that are too slow. Wire (leads) are attached to the pacemaker that goes into the chambers of you heart. This is done in the hospital and usually requires and overnight stay. Please see the instruction sheet given to you today for more information.  Your physician recommends that you continue on your current medications as directed. Please refer to the Current Medication list given to you today.  If you need a refill on your cardiac medications before your next appointment, please call your pharmacy.  Thank you for choosing [facility]!     

## 2015-08-20 NOTE — Progress Notes (Signed)
HPI [name] is referred today by Jodi Peters for evaluation of symptomatic bradycardia. The patient has been symptomatic for several months. She had been on low dose beta blocker therapy for her HTN and this was stopped. Her HR initially improved but she still c/o dizziness and wore a cardiac monitor which demonstrated pauses of over 4 seconds during the day when she was not sleeping and also noted resting bradycardia with dizziness and associated HR's in the 30-32 /min range. She has never had frank syncope. She notes that she makes Keloid. Allergies  Allergen Reactions  . Penicillins Rash    Has patient had a PCN reaction causing immediate rash, facial/tongue/throat swelling, SOB or lightheadedness with hypotension: Yes Has patient had a PCN reaction causing severe rash involving mucus membranes or skin necrosis: No Has patient had a PCN reaction that required hospitalization No Has patient had a PCN reaction occurring within the last 10 years: Yes If all of the above answers are "NO", then may proceed with Cephalosporin use.     Current Outpatient Prescriptions  Medication Sig Dispense Refill  . acetaminophen (TYLENOL) 500 MG tablet Take 500 mg by mouth every 6 (six) hours as needed for mild pain or moderate pain.    Marland Kitchen amLODipine (NORVASC) 10 MG tablet Take 1 tablet (10 mg total) by mouth daily. 30 tablet 0  . brimonidine (ALPHAGAN) 0.2 % ophthalmic solution Place 1 drop into both eyes 2 (two) times daily.    . Cholecalciferol (VITAMIN D3) 5000 units CAPS Take 1 capsule by mouth daily.    . clopidogrel (PLAVIX) 75 MG tablet Take 1 tablet (75 mg total) by mouth daily. 30 tablet 3  . dorzolamide-timolol (COSOPT) 22.3-6.8 MG/ML ophthalmic solution 1 drop 2 (two) times daily.      Marland Kitchen latanoprost (XALATAN) 0.005 % ophthalmic solution Place 1 drop into both eyes at bedtime.    . mirtazapine (REMERON) 15 MG tablet Take 15 mg by mouth at bedtime.    . pilocarpine (PILOCAR) 4 %  ophthalmic solution Place 1 drop into both eyes at bedtime.    Jodi Peters (SYSTANE OP) Apply 1 drop to eye as needed (for dry eye relief).     . Simethicone (GAS-X PO) Take 1 tablet by mouth daily as needed (for gas relief).     . simvastatin (ZOCOR) 20 MG tablet     . acetaZOLAMIDE (DIAMOX) 250 MG tablet      No current facility-administered medications for this visit.     Past Medical History  Diagnosis Date  . Hyperlipidemia   . Hypertension   . Mitral regurgitation     sinus bradycardia  . Breast pain     leftr  . Venous insufficiency   . Allergic rhinitis   . Seborrheic keratosis   . Breast mass     left  . Vertigo   . Carpal tunnel syndrome   . Cataract   . Glaucoma   . Constipation   . IBS (irritable bowel syndrome)   . Osteopenia   . Osteoarthritis   . Low back pain   . Breast cancer ([place])   . Anxiety   . Vitamin D deficiency   . Bradycardia   . Carotid atherosclerosis   . Rectocele 06/30/2014  . Cystocele 06/30/2014    ROS:   All systems reviewed and negative except as noted in the HPI.   Past Surgical History  Procedure Laterality Date  . Appendectomy    .  Cholecystectomy    . Keloid      left ear  . Eye surgery      for bleed in left eye  . Cyst removed from left breast    . Spurs on back    . Back surgery    . Abdominal hysterectomy    . Lumbar laminectomy/decompression microdiscectomy N/A 12/25/2014    Procedure: Thoracic eleven-twelve Laminectomy/Diskectomy; Lumbar three-four Laminectomy/Diskectomy;  Surgeon: Jodi Miss, MD;  Location: [facility];  Service: Neurosurgery;  Laterality: N/A;  . Breast lumpectomy       Family History  Problem Relation Age of Onset  . Heart disease Mother   . Hypertension Mother   . Diabetes Mother   . Other Daughter     left breast nodule  . Hypertension Son   . Hyperlipidemia Son   . Heart disease Maternal Grandmother   . Hypertension Maternal Grandmother   . Hypertension Son    . Other Son     boils     Social History   Social History  . Marital Status: Divorced    Spouse Name: N/A  . Number of Children: N/A  . Years of Education: N/A   Occupational History  . Not on file.   Social History Main Topics  . Smoking status: Never Smoker   . Smokeless tobacco: Never Used  . Alcohol Use: No  . Drug Use: No  . Sexual Activity: No     Comment: hyst   Other Topics Concern  . Not on file   Social History Narrative     BP 144/76 mmHg  Pulse 75  Ht 5' 4.5" (1.638 m)  Wt 129 lb (58.514 kg)  BMI 21.81 kg/m2  SpO2 91%  Physical Exam:  Well appearing 82 yo woman, NAD HEENT: Unremarkable except for large keloid on left ear Neck:  6 cm JVD, no thyromegally Lymphatics:  No adenopathy Back:  No CVA tenderness Lungs:  Clear with no wheezes rales, or rhonchi HEART:  Regular rate rhythm, no murmurs, no rubs, no clicks Abd:  soft, positive bowel sounds, no organomegally, no rebound, no guarding Ext:  2 plus pulses, no edema, no cyanosis, no clubbing Skin:  No rashes no nodules Neuro:  CN II through XII intact, motor grossly intact  Heart monitor - reviewed - sinus rhythm and sinus brady to 30/min and pauses of up to 4.2 seconds.  Assess/Plan: 1. Symptomatic sinus node dysfunction 2. Dizziness due to #1 3. HTN 4. Cryptogenic stroke Rec: i have discussed the multiple indications for PPM insertion. She is willing to proceed. We did discuss Keloid formation. I would anticipate we start coreg after her PPM has been placed.  Mikle Bosworth.D.

## 2015-08-26 ENCOUNTER — Encounter: Payer: Self-pay | Admitting: *Deleted

## 2015-08-29 MED ORDER — VANCOMYCIN HCL IN DEXTROSE 1-5 GM/200ML-% IV SOLN
1000.0000 mg | INTRAVENOUS | Status: AC
  Administered 2015-08-30: 1000 mg via INTRAVENOUS
  Filled 2015-08-29 (×2): qty 200

## 2015-08-29 MED ORDER — SODIUM CHLORIDE 0.9 % IR SOLN
80.0000 mg | Status: AC
  Administered 2015-08-30: 80 mg
  Filled 2015-08-29: qty 2

## 2015-08-30 ENCOUNTER — Encounter (HOSPITAL_COMMUNITY): Payer: Self-pay | Admitting: *Deleted

## 2015-08-30 ENCOUNTER — Encounter (HOSPITAL_COMMUNITY)
Admission: RE | Disposition: A | Discharge status: Home / Self Care | Payer: Self-pay | Source: Ambulatory Visit | Attending: Internal Medicine

## 2015-08-30 ENCOUNTER — Ambulatory Visit (HOSPITAL_COMMUNITY)
Admission: RE | Admit: 2015-08-30 | Discharge: 2015-08-31 | Disposition: A | Discharge status: Home / Self Care | Payer: Medicare Other | Source: Ambulatory Visit | Attending: Internal Medicine | Admitting: Internal Medicine

## 2015-08-30 DIAGNOSIS — E785 Hyperlipidemia, unspecified: Secondary | ICD-10-CM | POA: Insufficient documentation

## 2015-08-30 DIAGNOSIS — F419 Anxiety disorder, unspecified: Secondary | ICD-10-CM | POA: Diagnosis not present

## 2015-08-30 DIAGNOSIS — Z950 Presence of cardiac pacemaker: Secondary | ICD-10-CM

## 2015-08-30 DIAGNOSIS — I340 Nonrheumatic mitral (valve) insufficiency: Secondary | ICD-10-CM | POA: Insufficient documentation

## 2015-08-30 DIAGNOSIS — Z8249 Family history of ischemic heart disease and other diseases of the circulatory system: Secondary | ICD-10-CM | POA: Insufficient documentation

## 2015-08-30 DIAGNOSIS — Z8673 Personal history of transient ischemic attack (TIA), and cerebral infarction without residual deficits: Secondary | ICD-10-CM | POA: Diagnosis not present

## 2015-08-30 DIAGNOSIS — I10 Essential (primary) hypertension: Secondary | ICD-10-CM | POA: Diagnosis not present

## 2015-08-30 DIAGNOSIS — Z7902 Long term (current) use of antithrombotics/antiplatelets: Secondary | ICD-10-CM | POA: Diagnosis not present

## 2015-08-30 DIAGNOSIS — Z853 Personal history of malignant neoplasm of breast: Secondary | ICD-10-CM | POA: Diagnosis not present

## 2015-08-30 DIAGNOSIS — R42 Dizziness and giddiness: Secondary | ICD-10-CM | POA: Diagnosis not present

## 2015-08-30 DIAGNOSIS — Z880 Allergy status to penicillin: Secondary | ICD-10-CM | POA: Diagnosis not present

## 2015-08-30 DIAGNOSIS — I495 Sick sinus syndrome: Secondary | ICD-10-CM | POA: Diagnosis not present

## 2015-08-30 DIAGNOSIS — I872 Venous insufficiency (chronic) (peripheral): Secondary | ICD-10-CM | POA: Diagnosis not present

## 2015-08-30 DIAGNOSIS — E559 Vitamin D deficiency, unspecified: Secondary | ICD-10-CM | POA: Insufficient documentation

## 2015-08-30 HISTORY — PX: EP IMPLANTABLE DEVICE: SHX172B

## 2015-08-30 LAB — BASIC METABOLIC PANEL
Anion gap: 14 (ref 5–15)
BUN: 22 mg/dL — AB (ref 6–20)
CHLORIDE: 115 mmol/L — AB (ref 101–111)
CO2: 17 mmol/L — AB (ref 22–32)
Calcium: 10.5 mg/dL — ABNORMAL HIGH (ref 8.9–10.3)
Creatinine, Ser: 1.08 mg/dL — ABNORMAL HIGH (ref 0.44–1.00)
GFR calc Af Amer: 53 mL/min — ABNORMAL LOW (ref >60)
GFR calc non Af Amer: 46 mL/min — ABNORMAL LOW (ref >60)
GLUCOSE: 98 mg/dL (ref 65–99)
POTASSIUM: 3.8 mmol/L (ref 3.5–5.1)
Sodium: 146 mmol/L — ABNORMAL HIGH (ref 135–145)

## 2015-08-30 LAB — CBC
HCT: 41.3 % (ref 36.0–46.0)
Hemoglobin: 13.3 g/dL (ref 12.0–15.0)
MCH: 28.1 pg (ref 26.0–34.0)
MCHC: 32.2 g/dL (ref 30.0–36.0)
MCV: 87.3 fL (ref 78.0–100.0)
PLATELETS: 254 10*3/uL (ref 150–400)
RBC: 4.73 MIL/uL (ref 3.87–5.11)
RDW: 14.1 % (ref 11.5–15.5)
WBC: 6.8 10*3/uL (ref 4.0–10.5)

## 2015-08-30 LAB — PROTIME-INR
INR: 1.08 (ref 0.00–1.49)
PROTHROMBIN TIME: 14.2 s (ref 11.6–15.2)

## 2015-08-30 LAB — SURGICAL PCR SCREEN
MRSA, PCR: NEGATIVE
STAPHYLOCOCCUS AUREUS: NEGATIVE

## 2015-08-30 SURGERY — PACEMAKER IMPLANT

## 2015-08-30 MED ORDER — ACETAMINOPHEN 325 MG PO TABS: 325.0000 mg | ORAL_TABLET | ORAL | Status: DC | PRN

## 2015-08-30 MED ORDER — SODIUM CHLORIDE 0.9 % IV SOLN
INTRAVENOUS | Status: DC
  Administered 2015-08-30 (×2): via INTRAVENOUS

## 2015-08-30 MED ORDER — FENTANYL CITRATE (PF) 100 MCG/2ML IJ SOLN
INTRAMUSCULAR | Status: AC
  Filled 2015-08-30: qty 2

## 2015-08-30 MED ORDER — MIDAZOLAM HCL 5 MG/5ML IJ SOLN
INTRAMUSCULAR | Status: DC | PRN
  Administered 2015-08-30 (×3): 1 mg via INTRAVENOUS

## 2015-08-30 MED ORDER — CHLORHEXIDINE GLUCONATE 4 % EX LIQD: 60.0000 mL | Freq: Once | CUTANEOUS | Status: DC

## 2015-08-30 MED ORDER — LIDOCAINE HCL (PF) 1 % IJ SOLN
INTRAMUSCULAR | Status: DC | PRN
  Administered 2015-08-30: 35 mL via INTRADERMAL

## 2015-08-30 MED ORDER — DORZOLAMIDE HCL-TIMOLOL MAL 2-0.5 % OP SOLN
1.0000 [drp] | Freq: Two times a day (BID) | OPHTHALMIC | Status: DC
  Administered 2015-08-30 – 2015-08-31 (×2): 1 [drp] via OPHTHALMIC
  Filled 2015-08-30: qty 10

## 2015-08-30 MED ORDER — CLOPIDOGREL BISULFATE 75 MG PO TABS
75.0000 mg | ORAL_TABLET | Freq: Every day | ORAL | Status: DC
  Administered 2015-08-30 – 2015-08-31 (×2): 75 mg via ORAL
  Filled 2015-08-30 (×2): qty 1

## 2015-08-30 MED ORDER — AMLODIPINE BESYLATE 10 MG PO TABS
10.0000 mg | ORAL_TABLET | Freq: Every day | ORAL | Status: DC
  Administered 2015-08-30 – 2015-08-31 (×2): 10 mg via ORAL
  Filled 2015-08-30 (×2): qty 1

## 2015-08-30 MED ORDER — HEPARIN (PORCINE) IN NACL 2-0.9 UNIT/ML-% IJ SOLN
INTRAMUSCULAR | Status: AC
  Filled 2015-08-30: qty 500

## 2015-08-30 MED ORDER — VANCOMYCIN HCL IN DEXTROSE 1-5 GM/200ML-% IV SOLN
1000.0000 mg | Freq: Two times a day (BID) | INTRAVENOUS | Status: AC
  Administered 2015-08-31: 1000 mg via INTRAVENOUS
  Filled 2015-08-30: qty 200

## 2015-08-30 MED ORDER — BRIMONIDINE TARTRATE 0.2 % OP SOLN
1.0000 [drp] | Freq: Two times a day (BID) | OPHTHALMIC | Status: DC
  Administered 2015-08-30 – 2015-08-31 (×2): 1 [drp] via OPHTHALMIC
  Filled 2015-08-30: qty 5

## 2015-08-30 MED ORDER — SODIUM CHLORIDE 0.9 % IR SOLN
Status: AC
  Filled 2015-08-30: qty 2

## 2015-08-30 MED ORDER — PILOCARPINE HCL 4 % OP SOLN
1.0000 [drp] | Freq: Four times a day (QID) | OPHTHALMIC | Status: DC
Start: 2015-08-30 — End: 2015-08-31
  Administered 2015-08-30 – 2015-08-31 (×3): 1 [drp] via OPHTHALMIC
  Filled 2015-08-30: qty 15

## 2015-08-30 MED ORDER — MUPIROCIN 2 % EX OINT
1.0000 "application " | TOPICAL_OINTMENT | Freq: Once | CUTANEOUS | Status: AC
  Administered 2015-08-30: 1 via TOPICAL

## 2015-08-30 MED ORDER — HYDRALAZINE HCL 20 MG/ML IJ SOLN
10.0000 mg | INTRAMUSCULAR | Status: DC | PRN
  Administered 2015-08-30 – 2015-08-31 (×2): 10 mg via INTRAVENOUS
  Filled 2015-08-30 (×2): qty 1

## 2015-08-30 MED ORDER — ACETAMINOPHEN 500 MG PO TABS: 500.0000 mg | ORAL_TABLET | Freq: Four times a day (QID) | ORAL | Status: DC | PRN

## 2015-08-30 MED ORDER — FENTANYL CITRATE (PF) 100 MCG/2ML IJ SOLN
INTRAMUSCULAR | Status: DC | PRN
  Administered 2015-08-30 (×3): 12.5 ug via INTRAVENOUS

## 2015-08-30 MED ORDER — LIDOCAINE HCL (PF) 1 % IJ SOLN
INTRAMUSCULAR | Status: AC
  Filled 2015-08-30: qty 60

## 2015-08-30 MED ORDER — VITAMIN D3 25 MCG (1000 UNIT) PO TABS
5000.0000 [IU] | ORAL_TABLET | Freq: Every day | ORAL | Status: DC
  Administered 2015-08-30 – 2015-08-31 (×2): 5000 [IU] via ORAL
  Filled 2015-08-30 (×4): qty 5

## 2015-08-30 MED ORDER — MIDAZOLAM HCL 5 MG/5ML IJ SOLN
INTRAMUSCULAR | Status: AC
  Filled 2015-08-30: qty 5

## 2015-08-30 MED ORDER — MIRTAZAPINE 7.5 MG PO TABS
15.0000 mg | ORAL_TABLET | Freq: Every day | ORAL | Status: DC
  Administered 2015-08-30: 15 mg via ORAL
  Filled 2015-08-30: qty 2

## 2015-08-30 MED ORDER — ONDANSETRON HCL 4 MG/2ML IJ SOLN: 4.0000 mg | Freq: Four times a day (QID) | INTRAMUSCULAR | Status: DC | PRN

## 2015-08-30 MED ORDER — MUPIROCIN 2 % EX OINT
TOPICAL_OINTMENT | CUTANEOUS | Status: AC
  Administered 2015-08-30: 1 via TOPICAL
  Filled 2015-08-30: qty 22

## 2015-08-30 MED ORDER — SIMVASTATIN 20 MG PO TABS
20.0000 mg | ORAL_TABLET | Freq: Every day | ORAL | Status: DC
  Administered 2015-08-30: 20 mg via ORAL
  Filled 2015-08-30: qty 1

## 2015-08-30 MED ORDER — ACETAZOLAMIDE 250 MG PO TABS
250.0000 mg | ORAL_TABLET | Freq: Two times a day (BID) | ORAL | Status: DC
Start: 2015-08-30 — End: 2015-08-31
  Administered 2015-08-30 – 2015-08-31 (×2): 250 mg via ORAL
  Filled 2015-08-30 (×3): qty 1

## 2015-08-30 MED ORDER — LATANOPROST 0.005 % OP SOLN
1.0000 [drp] | Freq: Every day | OPHTHALMIC | Status: DC
Start: 2015-08-30 — End: 2015-08-31
  Administered 2015-08-30: 1 [drp] via OPHTHALMIC
  Filled 2015-08-30 (×2): qty 2.5

## 2015-08-30 MED ORDER — YOU HAVE A PACEMAKER BOOK
Freq: Once | Status: AC
  Administered 2015-08-30: 20:00:00
  Filled 2015-08-30: qty 1

## 2015-08-30 SURGICAL SUPPLY — 7 items
CABLE SURGICAL S-101-97-12 (CABLE) ×1 IMPLANT
LEAD CAPSURE NOVUS 45CM (Lead) ×1 IMPLANT
LEAD CAPSURE NOVUS 5076-52CM (Lead) ×1 IMPLANT
PAD DEFIB LIFELINK (PAD) ×1 IMPLANT
PPM ADVISA MRI DR A2DR01 (Pacemaker) ×1 IMPLANT
SHEATH CLASSIC 7F (SHEATH) ×2 IMPLANT
TRAY PACEMAKER INSERTION (PACKS) ×1 IMPLANT

## 2015-08-30 NOTE — Discharge Instructions (Signed)
° ° °  Supplemental Discharge Instructions for  Pacemaker/Defibrillator Patients  Activity No heavy lifting or vigorous activity with your left/right arm for 6 to 8 weeks.  Do not raise your left/right arm above your head for one week.  Gradually raise your affected arm as drawn below.           __          09/03/15                 09/04/15                   09/05/15                  09/06/15  NO DRIVING for  1 week   ; you may begin driving on   [id number]  .  WOUND CARE - Keep the wound area clean and dry.  Do not get this area wet for one week. No showers for one week; you may shower on   09/06/15  . - The tape/steri-strips on your wound will fall off; do not pull them off.  No bandage is needed on the site.  DO  NOT apply any creams, oils, or ointments to the wound area. - If you notice any drainage or discharge from the wound, any swelling or bruising at the site, or you develop a fever > 101? F after you are discharged home, call the office at once.  Special Instructions - You are still able to use cellular telephones; use the ear opposite the side where you have your pacemaker/defibrillator.  Avoid carrying your cellular phone near your device. - When traveling through airports, show security personnel your identification card to avoid being screened in the metal detectors.  Ask the security personnel to use the hand wand. - Avoid arc welding equipment, TENS units (transcutaneous nerve stimulators).  Call the office for questions about other devices. - Avoid electrical appliances that are in poor condition or are not properly grounded. - Microwave ovens are safe to be near or to operate.

## 2015-08-30 NOTE — H&P (View-Only) (Signed)
HPI Jodi Peters is referred today by [name] for evaluation of symptomatic bradycardia. The patient has been symptomatic for several months. She had been on low dose beta blocker therapy for her HTN and this was stopped. Her HR initially improved but she still c/o dizziness and wore a cardiac monitor which demonstrated pauses of over 4 seconds during the day when she was not sleeping and also noted resting bradycardia with dizziness and associated HR's in the 30-32 /min range. She has never had frank syncope. She notes that she makes Keloid. Allergies  Allergen Reactions  . Penicillins Rash    Has patient had a PCN reaction causing immediate rash, facial/tongue/throat swelling, SOB or lightheadedness with hypotension: Yes Has patient had a PCN reaction causing severe rash involving mucus membranes or skin necrosis: No Has patient had a PCN reaction that required hospitalization No Has patient had a PCN reaction occurring within the last 10 years: Yes If all of the above answers are "NO", then may proceed with Cephalosporin use.     Current Outpatient Prescriptions  Medication Sig Dispense Refill  . acetaminophen (TYLENOL) 500 MG tablet Take 500 mg by mouth every 6 (six) hours as needed for mild pain or moderate pain.    Marland Kitchen amLODipine (NORVASC) 10 MG tablet Take 1 tablet (10 mg total) by mouth daily. 30 tablet 0  . brimonidine (ALPHAGAN) 0.2 % ophthalmic solution Place 1 drop into both eyes 2 (two) times daily.    . Cholecalciferol (VITAMIN D3) 5000 units CAPS Take 1 capsule by mouth daily.    . clopidogrel (PLAVIX) 75 MG tablet Take 1 tablet (75 mg total) by mouth daily. 30 tablet 3  . dorzolamide-timolol (COSOPT) 22.3-6.8 MG/ML ophthalmic solution 1 drop 2 (two) times daily.      Marland Kitchen latanoprost (XALATAN) 0.005 % ophthalmic solution Place 1 drop into both eyes at bedtime.    . mirtazapine (REMERON) 15 MG tablet Take 15 mg by mouth at bedtime.    . pilocarpine (PILOCAR) 4 %  ophthalmic solution Place 1 drop into both eyes at bedtime.    [name] Glycol-Propyl Glycol (SYSTANE OP) Apply 1 drop to eye as needed (for dry eye relief).     . Simethicone (GAS-X PO) Take 1 tablet by mouth daily as needed (for gas relief).     . simvastatin (ZOCOR) 20 MG tablet     . acetaZOLAMIDE (DIAMOX) 250 MG tablet      No current facility-administered medications for this visit.     Past Medical History  Diagnosis Date  . Hyperlipidemia   . Hypertension   . Mitral regurgitation     sinus bradycardia  . Breast pain     leftr  . Venous insufficiency   . Allergic rhinitis   . Seborrheic keratosis   . Breast mass     left  . Vertigo   . Carpal tunnel syndrome   . Cataract   . Glaucoma   . Constipation   . IBS (irritable bowel syndrome)   . Osteopenia   . Osteoarthritis   . Low back pain   . Breast cancer ([place])   . Anxiety   . Vitamin D deficiency   . Bradycardia   . Carotid atherosclerosis   . Rectocele 06/30/2014  . Cystocele 06/30/2014    ROS:   All systems reviewed and negative except as noted in the HPI.   Past Surgical History  Procedure Laterality Date  . Appendectomy    .  Cholecystectomy    . Keloid      left ear  . Eye surgery      for bleed in left eye  . Cyst removed from left breast    . Spurs on back    . Back surgery    . Abdominal hysterectomy    . Lumbar laminectomy/decompression microdiscectomy N/A 12/25/2014    Procedure: Thoracic eleven-twelve Laminectomy/Diskectomy; Lumbar three-four Laminectomy/Diskectomy;  Surgeon: Kristeen Miss, MD;  Location: [place] [facility];  Service: Neurosurgery;  Laterality: N/A;  . Breast lumpectomy       Family History  Problem Relation Age of Onset  . Heart disease Mother   . Hypertension Mother   . Diabetes Mother   . Other Daughter     left breast nodule  . Hypertension Son   . Hyperlipidemia Son   . Heart disease Maternal Grandmother   . Hypertension Maternal Grandmother   . Hypertension Son    . Other Son     boils     Social History   Social History  . Marital Status: Divorced    Spouse Name: N/A  . Number of Children: N/A  . Years of Education: N/A   Occupational History  . Not on file.   Social History Main Topics  . Smoking status: Never Smoker   . Smokeless tobacco: Never Used  . Alcohol Use: No  . Drug Use: No  . Sexual Activity: No     Comment: hyst   Other Topics Concern  . Not on file   Social History Narrative     BP 144/76 mmHg  Pulse 75  Ht 5' 4.5" (1.638 m)  Wt 129 lb (58.514 kg)  BMI 21.81 kg/m2  SpO2 91%  Physical Exam:  Well appearing 82 yo woman, NAD HEENT: Unremarkable except for large keloid on left ear Neck:  6 cm JVD, no thyromegally Lymphatics:  No adenopathy Back:  No CVA tenderness Lungs:  Clear with no wheezes rales, or rhonchi HEART:  Regular rate rhythm, no murmurs, no rubs, no clicks Abd:  soft, positive bowel sounds, no organomegally, no rebound, no guarding Ext:  2 plus pulses, no edema, no cyanosis, no clubbing Skin:  No rashes no nodules Neuro:  CN II through XII intact, motor grossly intact  Heart monitor - reviewed - sinus rhythm and sinus brady to 30/min and pauses of up to 4.2 seconds.  Assess/Plan: 1. Symptomatic sinus node dysfunction 2. Dizziness due to #1 3. HTN 4. Cryptogenic stroke Rec: i have discussed the multiple indications for PPM insertion. She is willing to proceed. We did discuss Keloid formation. I would anticipate we start coreg after her PPM has been placed.  Jodi Peters.D.

## 2015-08-30 NOTE — Discharge Summary (Signed)
ELECTROPHYSIOLOGY PROCEDURE DISCHARGE SUMMARY    Patient ID: Jodi Peters,  MRN: [id number], DOB/AGE: 80/01/1930 80 y.o.  Admit date: 08/30/2015 Discharge date: 08/31/2015  Primary Care Physician: Rosita Fire, MD Primary Cardiologist: Bronson Ing Electrophysiologist: Lovena Le  Primary Discharge Diagnosis:  Symptomatic sinus node dysfunction status post pacemaker implantation this admission  Secondary Discharge Diagnosis:  1.  Hypertension 2.  Prior cryptogenic stroke 3.  Hyperlipidemia 4.  Prior breast cancer  Allergies  Allergen Reactions  . Penicillins Rash         Procedures This Admission:  1.  Implantation of a MDT dual chamber PPM on 08/30/15 by Dr Lovena Le.  The patient received a MDT model number Advisa PPM with model number 5076 right atrial lead and 5076 right ventricular lead. There were no immediate post procedure complications. 2.  CXR on 08/31/15 demonstrated no pneumothorax status post device implantation.   Brief HPI: Jodi Peters is a 82 y.o. female was referred to electrophysiology in the outpatient setting for consideration of PPM implantation.  Past medical history includes symptomatic bradycardia and hypertension.  The patient has had symptomatic bradycardia without reversible causes identified.  Risks, benefits, and alternatives to PPM implantation were reviewed with the patient who wished to proceed.   Hospital Course:  The patient was admitted and underwent implantation of a MDT MRI compatible dual chamber pacemaker with details as outlined above.  She  was monitored on telemetry overnight which demonstrated sinus rhythm.  Left chest was without hematoma or ecchymosis.  The device was interrogated and found to be functioning normally.  CXR was obtained and demonstrated no pneumothorax status post device implantation.  Wound care, arm mobility, and restrictions were reviewed with the patient.  The patient was examined and considered stable for discharge to  home.    Physical Exam: Filed Vitals:   08/31/15 0300 08/31/15 0400 08/31/15 0430 08/31/15 0500  BP: 127/40 162/53 162/53 162/58  Pulse:   86   Temp:   98.9 F (37.2 C)   TempSrc:   Oral   Resp: 14 18 22 22   Height:      Weight:   130 lb 11.7 oz (59.3 kg)   SpO2: 100% 100% 98% 98%    GEN- The patient is elderly appearing, alert and oriented x 3 today.   HEENT: normocephalic, atraumatic; sclera clear, conjunctiva pink; hearing intact; oropharynx clear; neck supple  Lungs- Clear to ausculation bilaterally, normal work of breathing.  No wheezes, rales, rhonchi Heart- Regular rate and rhythm, 2/6 SEM GI- soft, non-tender, non-distended, bowel sounds present  Extremities- no clubbing, cyanosis, or edema; DP/PT/radial pulses 2+ bilaterally MS- no significant deformity or atrophy Skin- warm and dry, no rash or lesion, left chest without hematoma/ecchymosis Psych- euthymic mood, full affect Neuro- strength and sensation are intact   Labs:   Lab Results  Component Value Date   WBC 6.8 08/30/2015   HGB 13.3 08/30/2015   HCT 41.3 08/30/2015   MCV 87.3 08/30/2015   PLT 254 08/30/2015     Recent Labs Lab 08/30/15 1421  NA 146*  K 3.8  CL 115*  CO2 17*  BUN 22*  CREATININE 1.08*  CALCIUM 10.5*  GLUCOSE 98    Discharge Medications:    Medication List    TAKE these medications        acetaminophen 500 MG tablet  Commonly known as:  TYLENOL  Take 500 mg by mouth every 6 (six) hours as needed for mild pain or moderate pain.  acetaZOLAMIDE 250 MG tablet  Commonly known as:  DIAMOX  Take 250 mg by mouth 2 (two) times daily.     amLODipine 10 MG tablet  Commonly known as:  NORVASC  Take 1 tablet (10 mg total) by mouth daily.     brimonidine 0.2 % ophthalmic solution  Commonly known as:  ALPHAGAN  Place 1 drop into both eyes 2 (two) times daily.     clopidogrel 75 MG tablet  Commonly known as:  PLAVIX  Take 1 tablet (75 mg total) by mouth daily.      dorzolamide-timolol 22.3-6.8 MG/ML ophthalmic solution  Commonly known as:  COSOPT  Place 1 drop into both eyes 2 (two) times daily.     GAS-X PO  Take 1 tablet by mouth daily as needed (for gas relief).     latanoprost 0.005 % ophthalmic solution  Commonly known as:  XALATAN  Place 1 drop into both eyes at bedtime.     mirtazapine 15 MG tablet  Commonly known as:  REMERON  Take 15 mg by mouth at bedtime.     pilocarpine 4 % ophthalmic solution  Commonly known as:  PILOCAR  Place 1 drop into both eyes 4 (four) times daily.     simvastatin 20 MG tablet  Commonly known as:  ZOCOR  Take 20 mg by mouth daily at 6 PM.     SYSTANE OP  Apply 1 drop to eye as needed (for dry eye relief).     Vitamin D3 5000 units Caps  Take 1 capsule by mouth daily.        Disposition:  Discharge Instructions    Diet - low sodium heart healthy    Complete by:  As directed      Increase activity slowly    Complete by:  As directed           Follow-up Information    Follow up with [facility] On 09/09/2015.   Specialty:  Cardiology   Why:  at [facility] for wound check    Contact information:   [address], [place] [phone]      Follow up with Cristopher Peru, MD On 11/29/2015.   Specialty:  Cardiology   Why:  at 10:15AM    Contact information:   [place] [address] [phone]       Duration of Discharge Encounter: Greater than 30 minutes including physician time.  Signed, Chanetta Marshall, NP 08/31/2015 7:28 AM   EP Attending  Patient seen and examined. Agree with the findings as noted above. He PPM is functioning normally. She is stable for discharge home with usual followup.  Mikle Bosworth.D.

## 2015-08-30 NOTE — Interval H&P Note (Signed)
History and Physical Interval Note:  08/30/2015 3:22 PM  Jodi Peters  has presented today for surgery, with the diagnosis of bradicardia  The various methods of treatment have been discussed with the patient and family. After consideration of risks, benefits and other options for treatment, the patient has consented to  Procedure(s): Pacemaker Implant (N/A) as a surgical intervention .  The patient's history has been reviewed, patient examined, no change in status, stable for surgery.  I have reviewed the patient's chart and labs.  Questions were answered to the patient's satisfaction.     Cristopher Peru, M.D.

## 2015-08-31 ENCOUNTER — Encounter (HOSPITAL_COMMUNITY): Payer: Self-pay | Admitting: Internal Medicine

## 2015-08-31 ENCOUNTER — Ambulatory Visit (HOSPITAL_COMMUNITY): Payer: Medicare Other

## 2015-08-31 DIAGNOSIS — Z8673 Personal history of transient ischemic attack (TIA), and cerebral infarction without residual deficits: Secondary | ICD-10-CM | POA: Diagnosis not present

## 2015-08-31 DIAGNOSIS — I10 Essential (primary) hypertension: Secondary | ICD-10-CM | POA: Diagnosis not present

## 2015-08-31 DIAGNOSIS — E785 Hyperlipidemia, unspecified: Secondary | ICD-10-CM | POA: Diagnosis not present

## 2015-08-31 DIAGNOSIS — R42 Dizziness and giddiness: Secondary | ICD-10-CM | POA: Diagnosis not present

## 2015-08-31 DIAGNOSIS — I340 Nonrheumatic mitral (valve) insufficiency: Secondary | ICD-10-CM | POA: Diagnosis not present

## 2015-08-31 DIAGNOSIS — Z7902 Long term (current) use of antithrombotics/antiplatelets: Secondary | ICD-10-CM | POA: Diagnosis not present

## 2015-08-31 DIAGNOSIS — I495 Sick sinus syndrome: Secondary | ICD-10-CM | POA: Diagnosis not present

## 2015-08-31 DIAGNOSIS — Z853 Personal history of malignant neoplasm of breast: Secondary | ICD-10-CM | POA: Diagnosis not present

## 2015-08-31 DIAGNOSIS — E559 Vitamin D deficiency, unspecified: Secondary | ICD-10-CM | POA: Diagnosis not present

## 2015-08-31 DIAGNOSIS — I872 Venous insufficiency (chronic) (peripheral): Secondary | ICD-10-CM | POA: Diagnosis not present

## 2015-08-31 DIAGNOSIS — Z45018 Encounter for adjustment and management of other part of cardiac pacemaker: Secondary | ICD-10-CM | POA: Diagnosis not present

## 2015-08-31 DIAGNOSIS — Z8249 Family history of ischemic heart disease and other diseases of the circulatory system: Secondary | ICD-10-CM | POA: Diagnosis not present

## 2015-08-31 DIAGNOSIS — Z880 Allergy status to penicillin: Secondary | ICD-10-CM | POA: Diagnosis not present

## 2015-08-31 MED FILL — Heparin Sodium (Porcine) 2 Unit/ML in Sodium Chloride 0.9%: INTRAMUSCULAR | Qty: 500 | Status: AC

## 2015-08-31 NOTE — Care Management Note (Signed)
Case Management Note  Patient Details  Name: Jodi Peters MRN: [id number] Date of Birth: 1929-06-18  Subjective/Objective:   status post pacemaker implantation                  Action/Plan: Discharge Planning: AVS reviewed:  NCM spoke to pt and lives in home with son, Jodi Peters. Dtr, Jodi Peters will stay with pt today post dc. Has RW at home. Son transports pt to her appts. She can afford her medications.  PCP - Rosita Fire MD Expected Discharge Date:                  Expected Discharge Plan:  Home/Self Care  In-House Referral:  NA  Discharge planning Services  CM Consult  Post Acute Care Choice:  NA Choice offered to:  NA  DME Arranged:  N/A DME Agency:  NA  HH Arranged:  NA HH Agency:  NA  Status of Service:  Completed, signed off  Medicare Important Message Given:    Date Medicare IM Given:    Medicare IM give by:    Date Additional Medicare IM Given:    Additional Medicare Important Message give by:     If discussed at [place] of Stay Meetings, dates discussed:    Additional Comments:  Erenest Rasher, RN 08/31/2015, 9:04 AM

## 2015-09-09 ENCOUNTER — Encounter: Payer: Self-pay | Admitting: Internal Medicine

## 2015-09-09 ENCOUNTER — Ambulatory Visit (INDEPENDENT_AMBULATORY_CARE_PROVIDER_SITE_OTHER): Payer: Medicare Other | Admitting: *Deleted

## 2015-09-09 DIAGNOSIS — Z950 Presence of cardiac pacemaker: Secondary | ICD-10-CM

## 2015-09-09 LAB — CUP PACEART INCLINIC DEVICE CHECK
Battery Voltage: 3.11 V
Brady Statistic AP VP Percent: 0.12 %
Brady Statistic AS VP Percent: 0.02 %
Brady Statistic RA Percent Paced: 48.6 %
Date Time Interrogation Session: 20170330152343
Implantable Lead Implant Date: 20170320
Implantable Lead Location: 753859
Implantable Lead Location: 753860
Implantable Lead Model: 5076
Implantable Lead Model: 5076
Lead Channel Impedance Value: 304 Ohm
Lead Channel Impedance Value: 437 Ohm
Lead Channel Impedance Value: 532 Ohm
Lead Channel Pacing Threshold Amplitude: 1 V
Lead Channel Pacing Threshold Amplitude: 1 V
Lead Channel Pacing Threshold Pulse Width: 0.4 ms
Lead Channel Sensing Intrinsic Amplitude: 2.8 mV
MDC IDC LEAD IMPLANT DT: 20170320
MDC IDC MSMT LEADCHNL RV IMPEDANCE VALUE: 437 Ohm
MDC IDC MSMT LEADCHNL RV PACING THRESHOLD PULSEWIDTH: 0.4 ms
MDC IDC MSMT LEADCHNL RV SENSING INTR AMPL: 23 mV
MDC IDC SET LEADCHNL RA PACING AMPLITUDE: 3.5 V
MDC IDC SET LEADCHNL RV PACING AMPLITUDE: 3.5 V
MDC IDC SET LEADCHNL RV PACING PULSEWIDTH: 0.4 ms
MDC IDC SET LEADCHNL RV SENSING SENSITIVITY: 2.8 mV
MDC IDC STAT BRADY AP VS PERCENT: 48.49 %
MDC IDC STAT BRADY AS VS PERCENT: 51.38 %
MDC IDC STAT BRADY RV PERCENT PACED: 0.14 %

## 2015-09-09 NOTE — Progress Notes (Signed)
Wound check appointment. Steri-strips removed. Wound without redness or edema. Incision edges approximated, wound well healed. Normal device function. Thresholds, sensing, and impedances consistent with implant measurements. Device programmed at 3.5V for extra safety margin until 3 month visit. Histogram distribution appropriate for patient and level of activity. No mode switches or high ventricular rates noted. Patient educated about wound care, arm mobility, lifting restrictions. Battery voltage at 3.11V.  ROV with GT/RDS on 6/19

## 2015-09-17 ENCOUNTER — Encounter: Payer: Self-pay | Admitting: Adult Health

## 2015-09-17 ENCOUNTER — Ambulatory Visit (INDEPENDENT_AMBULATORY_CARE_PROVIDER_SITE_OTHER): Payer: Medicare Other | Admitting: Adult Health

## 2015-09-17 ENCOUNTER — Ambulatory Visit (HOSPITAL_COMMUNITY)
Admission: RE | Admit: 2015-09-17 | Discharge: 2015-09-17 | Disposition: A | Discharge status: Home / Self Care | Payer: Medicare Other | Source: Ambulatory Visit | Attending: Adult Health | Admitting: Adult Health

## 2015-09-17 ENCOUNTER — Emergency Department (HOSPITAL_COMMUNITY)
Admission: EM | Admit: 2015-09-17 | Discharge: 2015-09-17 | Disposition: A | Discharge status: Home / Self Care | Payer: Medicare Other | Attending: Emergency Medicine | Admitting: Emergency Medicine

## 2015-09-17 ENCOUNTER — Encounter (HOSPITAL_COMMUNITY): Payer: Self-pay | Admitting: *Deleted

## 2015-09-17 ENCOUNTER — Other Ambulatory Visit (HOSPITAL_COMMUNITY)
Admission: RE | Admit: 2015-09-17 | Discharge: 2015-09-17 | Disposition: A | Discharge status: Home / Self Care | Payer: Medicare Other | Source: Ambulatory Visit | Attending: Adult Health | Admitting: Adult Health

## 2015-09-17 ENCOUNTER — Other Ambulatory Visit: Payer: Self-pay | Admitting: Adult Health

## 2015-09-17 ENCOUNTER — Telehealth: Payer: Self-pay | Admitting: Internal Medicine

## 2015-09-17 VITALS — BP 158/68 | HR 67 | Ht 64.0 in | Wt 130.0 lb

## 2015-09-17 DIAGNOSIS — I82621 Acute embolism and thrombosis of deep veins of right upper extremity: Secondary | ICD-10-CM | POA: Diagnosis not present

## 2015-09-17 DIAGNOSIS — I10 Essential (primary) hypertension: Secondary | ICD-10-CM | POA: Diagnosis not present

## 2015-09-17 DIAGNOSIS — E785 Hyperlipidemia, unspecified: Secondary | ICD-10-CM | POA: Insufficient documentation

## 2015-09-17 DIAGNOSIS — Z7982 Long term (current) use of aspirin: Secondary | ICD-10-CM | POA: Diagnosis not present

## 2015-09-17 DIAGNOSIS — Z950 Presence of cardiac pacemaker: Secondary | ICD-10-CM | POA: Diagnosis not present

## 2015-09-17 DIAGNOSIS — I82A12 Acute embolism and thrombosis of left axillary vein: Secondary | ICD-10-CM

## 2015-09-17 DIAGNOSIS — Z79899 Other long term (current) drug therapy: Secondary | ICD-10-CM | POA: Diagnosis not present

## 2015-09-17 DIAGNOSIS — M7989 Other specified soft tissue disorders: Secondary | ICD-10-CM

## 2015-09-17 DIAGNOSIS — I82612 Acute embolism and thrombosis of superficial veins of left upper extremity: Secondary | ICD-10-CM | POA: Insufficient documentation

## 2015-09-17 DIAGNOSIS — I82622 Acute embolism and thrombosis of deep veins of left upper extremity: Secondary | ICD-10-CM

## 2015-09-17 DIAGNOSIS — M1990 Unspecified osteoarthritis, unspecified site: Secondary | ICD-10-CM | POA: Insufficient documentation

## 2015-09-17 LAB — D-DIMER, QUANTITATIVE (NOT AT ARMC): D DIMER QUANT: 3.42 ug{FEU}/mL — AB (ref 0.00–0.50)

## 2015-09-17 LAB — CBC WITH DIFFERENTIAL/PLATELET
BASOS ABS: 0.1 10*3/uL (ref 0.0–0.1)
BASOS PCT: 1 %
Eosinophils Absolute: 0.2 10*3/uL (ref 0.0–0.7)
Eosinophils Relative: 3 %
HCT: 38.7 % (ref 36.0–46.0)
Hemoglobin: 12.6 g/dL (ref 12.0–15.0)
Lymphocytes Relative: 33 %
Lymphs Abs: 2.6 10*3/uL (ref 0.7–4.0)
MCH: 28.7 pg (ref 26.0–34.0)
MCHC: 32.6 g/dL (ref 30.0–36.0)
MCV: 88.2 fL (ref 78.0–100.0)
MONO ABS: 0.6 10*3/uL (ref 0.1–1.0)
Monocytes Relative: 8 %
NEUTROS ABS: 4.2 10*3/uL (ref 1.7–7.7)
Neutrophils Relative %: 55 %
Platelets: 369 10*3/uL (ref 150–400)
RBC: 4.39 MIL/uL (ref 3.87–5.11)
RDW: 13.8 % (ref 11.5–15.5)
WBC: 7.7 10*3/uL (ref 4.0–10.5)

## 2015-09-17 LAB — BASIC METABOLIC PANEL
ANION GAP: 8 (ref 5–15)
BUN: 17 mg/dL (ref 6–20)
CALCIUM: 10.1 mg/dL (ref 8.9–10.3)
CO2: 20 mmol/L — ABNORMAL LOW (ref 22–32)
Chloride: 114 mmol/L — ABNORMAL HIGH (ref 101–111)
Creatinine, Ser: 0.86 mg/dL (ref 0.44–1.00)
GFR calc Af Amer: >60 mL/min (ref >60)
GLUCOSE: 99 mg/dL (ref 65–99)
Potassium: 3.3 mmol/L — ABNORMAL LOW (ref 3.5–5.1)
Sodium: 142 mmol/L (ref 135–145)

## 2015-09-17 MED ORDER — APIXABAN 5 MG PO TABS: 5.0000 mg | ORAL_TABLET | Freq: Two times a day (BID) | ORAL | Status: DC

## 2015-09-17 MED ORDER — APIXABAN 5 MG PO TABS
ORAL_TABLET | ORAL | Status: AC
  Filled 2015-09-17: qty 2

## 2015-09-17 MED ORDER — APIXABAN 5 MG PO TABS
10.0000 mg | ORAL_TABLET | Freq: Once | ORAL | Status: AC
  Administered 2015-09-17: 10 mg via ORAL
  Filled 2015-09-17: qty 2

## 2015-09-17 NOTE — ED Provider Notes (Signed)
CSN: [id number]     Arrival date & time 09/17/15  1806 History   First MD Initiated Contact with Patient 09/17/15 1815     Chief Complaint  Patient presents with  . DVT     (Consider location/radiation/quality/duration/timing/severity/associated sxs/prior Treatment) HPI ...... left upper extremity swelling for 2 days. No trauma. No fever, sweats, chills. Status post pacemaker placement 08/30/15.  No chest pain or dyspnea. Severity is moderate. Nothing makes symptoms better or worse. Past Medical History  Diagnosis Date  . Hyperlipidemia   . Hypertension   . Mitral regurgitation     sinus bradycardia  . Breast pain     leftr  . Venous insufficiency   . Allergic rhinitis   . Seborrheic keratosis   . Breast mass     left  . Vertigo   . Carpal tunnel syndrome   . Cataract   . Glaucoma   . Constipation   . IBS (irritable bowel syndrome)   . Osteopenia   . Osteoarthritis   . Low back pain   . Breast cancer ([place])   . Anxiety   . Vitamin D deficiency   . Bradycardia   . Carotid atherosclerosis   . Rectocele 06/30/2014  . Cystocele 06/30/2014   Past Surgical History  Procedure Laterality Date  . Appendectomy    . Cholecystectomy    . Keloid      left ear  . Eye surgery      for bleed in left eye  . Cyst removed from left breast    . Spurs on back    . Back surgery    . Abdominal hysterectomy    . Lumbar laminectomy/decompression microdiscectomy N/A 12/25/2014    Procedure: Thoracic eleven-twelve Laminectomy/Diskectomy; Lumbar three-four Laminectomy/Diskectomy;  Surgeon: Kristeen Miss, MD;  Location: [place] [facility];  Service: Neurosurgery;  Laterality: N/A;  . Breast lumpectomy    . Ep implantable device N/A 08/30/2015    Procedure: Pacemaker Implant;  Surgeon: Evans Lance, MD;  Location: [place] [facility];  Service: Cardiovascular;  Laterality: N/A;   Family History  Problem Relation Age of Onset  . Heart disease Mother   . Hypertension Mother   . Diabetes Mother   .  Other Daughter     left breast nodule  . Hypertension Son   . Hyperlipidemia Son   . Heart disease Maternal Grandmother   . Hypertension Maternal Grandmother   . Hypertension Son   . Other Son     boils   Social History  Substance Use Topics  . Smoking status: Never Smoker   . Smokeless tobacco: Never Used  . Alcohol Use: No   OB History    Gravida Para Term Preterm AB TAB SAB Ectopic Multiple Living   5 5        5      Review of Systems  All other systems reviewed and are negative.     Allergies  Penicillins  Home Medications   Prior to Admission medications   Medication Sig Start Date End Date Taking? Authorizing Provider  acetaminophen (TYLENOL) 500 MG tablet Take 500 mg by mouth every 6 (six) hours as needed for mild pain or moderate pain.   Yes Historical Provider, MD  acetaZOLAMIDE (DIAMOX) 250 MG tablet Take 250 mg by mouth 2 (two) times daily.  08/03/15  Yes Historical Provider, MD  amLODipine (NORVASC) 10 MG tablet Take 1 tablet (10 mg total) by mouth daily. 12/28/14  Yes Eugenie Filler, MD  aspirin EC 81 MG tablet Take 81 mg by mouth every morning.   Yes Historical Provider, MD  brimonidine (ALPHAGAN) 0.2 % ophthalmic solution Place 1 drop into both eyes 2 (two) times daily. 05/13/15  Yes Historical Provider, MD  Cholecalciferol (VITAMIN D3) 5000 units CAPS Take 1 capsule by mouth daily.   Yes Historical Provider, MD  clopidogrel (PLAVIX) 75 MG tablet Take 1 tablet (75 mg total) by mouth daily. 06/23/15  Yes Rosita Fire, MD  dorzolamide-timolol (COSOPT) 22.3-6.8 MG/ML ophthalmic solution Place 1 drop into both eyes 2 (two) times daily.    Yes Historical Provider, MD  latanoprost (XALATAN) 0.005 % ophthalmic solution Place 1 drop into both eyes at bedtime.   Yes Historical Provider, MD  mirtazapine (REMERON) 15 MG tablet Take 15 mg by mouth at bedtime.   Yes Historical Provider, MD  pilocarpine (PILOCAR) 4 % ophthalmic solution Place 1 drop into both eyes 4 (four)  times daily.  02/17/14  Yes Historical Provider, MD  Polyethyl Glycol-Propyl Glycol (SYSTANE OP) Apply 1 drop to eye as needed (for dry eye relief).    Yes Historical Provider, MD  Simethicone (GAS-X PO) Take 1 tablet by mouth daily as needed (for gas relief).    Yes Historical Provider, MD  simvastatin (ZOCOR) 20 MG tablet Take 20 mg by mouth daily at 6 PM.  07/13/15  Yes Historical Provider, MD  apixaban (ELIQUIS) 5 MG TABS tablet Take 1 tablet (5 mg total) by mouth 2 (two) times daily. 09/17/15   Nat Christen, MD   BP 164/64 mmHg  Pulse 66  Temp(Src) 97.9 F (36.6 C) (Oral)  Resp 16  Ht 5\' 4"  (1.626 m)  Wt 130 lb (58.968 kg)  BMI 22.30 kg/m2  SpO2 100% Physical Exam  Constitutional: She is oriented to person, place, and time. She appears well-developed and well-nourished.  HENT:  Head: Normocephalic and atraumatic.  Eyes: Conjunctivae and EOM are normal. Pupils are equal, round, and reactive to light.  Neck: Normal range of motion. Neck supple.  Cardiovascular: Normal rate and regular rhythm.   Pulmonary/Chest: Effort normal and breath sounds normal.  Abdominal: Soft. Bowel sounds are normal.  Musculoskeletal:  Left upper extremity puffy and tender in the axillary region  Neurological: She is alert and oriented to person, place, and time.  Skin: Skin is warm and dry.  Psychiatric: She has a normal mood and affect. Her behavior is normal.  Nursing note and vitals reviewed.   ED Course  Procedures (including critical care time) Labs Review Labs Reviewed  BASIC METABOLIC PANEL - Abnormal; Notable for the following:    Potassium 3.3 (*)    Chloride 114 (*)    CO2 20 (*)    All other components within normal limits  CBC WITH DIFFERENTIAL/PLATELET    Imaging Review US Venous Img Upper Uni Left  09/17/2015  CLINICAL DATA:  82 year old female with left upper extremity swelling EXAM: LEFT UPPER EXTREMITY VENOUS DOPPLER ULTRASOUND TECHNIQUE: Gray-scale sonography with graded  compression, as well as color Doppler and duplex ultrasound were performed to evaluate the upper extremity deep venous system from the level of the subclavian vein and including the jugular, axillary, basilic, radial, ulnar and upper cephalic vein. Spectral Doppler was utilized to evaluate flow at rest and with distal augmentation maneuvers. COMPARISON:  None. FINDINGS: Contralateral internal jugular Vein: Respiratory phasicity is normal and symmetric with the symptomatic side. No evidence of thrombus. Normal compressibility. Internal Jugular Vein: No evidence of thrombus. Normal compressibility, respiratory phasicity  and response to augmentation. Subclavian Vein: No evidence of thrombus. Normal compressibility, respiratory phasicity and response to augmentation. Axillary Vein: Occlusive thrombus of the left axillary vein. Cephalic Vein: No evidence of thrombus. Normal compressibility, respiratory phasicity and response to augmentation. Basilic Vein: Thrombosis of the left basilic vein. Brachial Veins: No evidence of thrombus. Normal compressibility, respiratory phasicity and response to augmentation. Radial Veins: No evidence of thrombus. Normal compressibility, respiratory phasicity and response to augmentation. Ulnar Veins: No evidence of thrombus. Normal compressibility, respiratory phasicity and response to augmentation. IMPRESSION: Study is positive for left upper extremity DVT, with occlusive thrombus of the left axillary vein. Thrombus of the left basilic vein (superficial). Signed, Dulcy Fanny. Earleen Newport, DO [facility] Radiology Electronically Signed   By: Corrie Mckusick D.O.   On: 09/17/2015 17:33   I have personally reviewed and evaluated these images and lab results as part of my medical decision-making.   EKG Interpretation None      MDM   Final diagnoses:  Acute deep vein thrombosis (DVT) of axillary vein of left upper extremity (HCC)    Venous  ultrasound reveals a left upper extremity DVT with occlusive thrombus of the left axillary vein. Discussed with Dr. Harl Bowie cardiologist. Will start Eliquis 10 mg twice a day for 7 days; then 5 mg twice a day. Follow-up with cardiology.    Nat Christen, MD 09/17/15 2123

## 2015-09-17 NOTE — Telephone Encounter (Signed)
Patient stated that she was having swelling in arm and at site of pacemaker insertion. / tg

## 2015-09-17 NOTE — ED Notes (Signed)
Pt went to PCP for left arm swelling, pt had US done that was positive for a DVT. Pt denies any chest pain or shortness of breath. NAD noted.

## 2015-09-17 NOTE — Progress Notes (Signed)
Cardiology Office Note   Date:  09/17/2015   ID:  Jodi Peters, DOB 06-19-1929, MRN [id number]  PCP:  Jodi Fire, MD  Cardiologist:  Woodroe Chen, NP   No chief complaint on file.     History of Present Illness: Jodi Peters is a 82 y.o. female who presents for assessment and management bradycardia, taken off the beta blocker, Holter monitor placed, which revealed pauses over 4 seconds, and resting bradycardia, with heart rates between 30 and 32 beats per minute.  She was referred to Dr. Lovena Peters, who planned a prior pacemaker insertion. The patient had implantation of MDT dual-chamber pacemaker on 08/30/2015.( model number Advisa PPM with model number 5076 right atrial lead and 5076 right ventricular lead). Chest followup wound check appointment on 09/09/2015.  Pacemaker was found to be functioning a properly and when was found to be healing properly. Patient called our office on 09/16/2004.  He was complaining of swelling in her arm at the site of pacemaker insertion.  She is been added onto my schedule for evaluation.  This is this visit is  focused on her left arm edema. She states the swelling began yesterday, she states that other people noticed that her hand and forearm was swelling as well.  She denies pain.  She does have some trouble squeezing her hand closed because of the edema.  There has been no heat or sinus infection.  She denies fever or chills.  Past Medical History  Diagnosis Date  . Hyperlipidemia   . Hypertension   . Mitral regurgitation     sinus bradycardia  . Breast pain     leftr  . Venous insufficiency   . Allergic rhinitis   . Seborrheic keratosis   . Breast mass     left  . Vertigo   . Carpal tunnel syndrome   . Cataract   . Glaucoma   . Constipation   . IBS (irritable bowel syndrome)   . Osteopenia   . Osteoarthritis   . Low back pain   . Breast cancer ([place])   . Anxiety   . Vitamin D deficiency   . Bradycardia   . Carotid  atherosclerosis   . Rectocele 06/30/2014  . Cystocele 06/30/2014    Past Surgical History  Procedure Laterality Date  . Appendectomy    . Cholecystectomy    . Keloid      left ear  . Eye surgery      for bleed in left eye  . Cyst removed from left breast    . Spurs on back    . Back surgery    . Abdominal hysterectomy    . Lumbar laminectomy/decompression microdiscectomy N/A 12/25/2014    Procedure: Thoracic eleven-twelve Laminectomy/Diskectomy; Lumbar three-four Laminectomy/Diskectomy;  Surgeon: Kristeen Miss, MD;  Location: [facility];  Service: Neurosurgery;  Laterality: N/A;  . Breast lumpectomy    . Ep implantable device N/A 08/30/2015    Procedure: Pacemaker Implant;  Surgeon: Evans Lance, MD;  Location: [facility];  Service: Cardiovascular;  Laterality: N/A;     Current Outpatient Prescriptions  Medication Sig Dispense Refill  . acetaminophen (TYLENOL) 500 MG tablet Take 500 mg by mouth every 6 (six) hours as needed for mild pain or moderate pain.    Marland Kitchen acetaZOLAMIDE (DIAMOX) 250 MG tablet Take 250 mg by mouth 2 (two) times daily.     Marland Kitchen amLODipine (NORVASC) 10 MG tablet Take 1 tablet (10 mg total) by mouth daily. [place]  tablet 0  . brimonidine (ALPHAGAN) 0.2 % ophthalmic solution Place 1 drop into both eyes 2 (two) times daily.    . Cholecalciferol (VITAMIN D3) 5000 units CAPS Take 1 capsule by mouth daily.    . clopidogrel (PLAVIX) 75 MG tablet Take 1 tablet (75 mg total) by mouth daily. 30 tablet 3  . dorzolamide-timolol (COSOPT) 22.3-6.8 MG/ML ophthalmic solution Place 1 drop into both eyes 2 (two) times daily.     Marland Kitchen latanoprost (XALATAN) 0.005 % ophthalmic solution Place 1 drop into both eyes at bedtime.    . mirtazapine (REMERON) 15 MG tablet Take 15 mg by mouth at bedtime.    . pilocarpine (PILOCAR) 4 % ophthalmic solution Place 1 drop into both eyes 4 (four) times daily.     [name] Glycol-Propyl Glycol (SYSTANE OP) Apply 1 drop to eye as needed (for dry eye  relief).     . Simethicone (GAS-X PO) Take 1 tablet by mouth daily as needed (for gas relief).     . simvastatin (ZOCOR) 20 MG tablet Take 20 mg by mouth daily at 6 PM.      No current facility-administered medications for this visit.    Allergies:   Penicillins    Social History:  The patient  reports that she has never smoked. She has never used smokeless tobacco. She reports that she does not drink alcohol or use illicit drugs.   Family History:  The patient's family history includes Diabetes in her mother; Heart disease in her maternal grandmother and mother; Hyperlipidemia in her son; Hypertension in her maternal grandmother, mother, son, and son; Other in her daughter and son.    ROS: All other systems are reviewed and negative. Unless otherwise mentioned in H&P    PHYSICAL EXAM: VS:  There were no vitals taken for this visit. , BMI There is no weight on file to calculate BMI. GEN: Well nourished, well developed, in no acute distress HEENT: normal Neck: no JVD, carotid bruits, or masses Cardiac: RRR; distant heart sounds,no murmurs, rubs, or gallops,no edema  Respiratory:  clear to auscultation bilaterally, normal work of breathing GI: soft, nontender, nondistended, + BS MS: no deformity or atrophypacemaker site and left upper chest is clean, dry, and without infection.  Left forearm below the elbow to the hand is mildly edematous, good radial pulse.  No pain with palpation. Skin: warm and dry, no rash Neuro:  Strength and sensation are intact Psych: euthymic mood, full affect   Recent Labs: 06/21/2015: Magnesium 2.8* 06/22/2015: ALT 9* 06/23/2015: TSH 2.084 08/30/2015: BUN 22*; Creatinine, Ser 1.08*; Hemoglobin 13.3; Platelets 254; Potassium 3.8; Sodium 146*    Lipid Panel    Component Value Date/Time   CHOL 184 02/23/2015 0920   TRIG 90 02/23/2015 0920   HDL 59 02/23/2015 0920   CHOLHDL 3.1 02/23/2015 0920   VLDL 18 02/23/2015 0920   LDLCALC 107* 02/23/2015 0920       Wt Readings from Last 3 Encounters:  08/31/15 130 lb 11.7 oz (59.3 kg)  08/20/15 129 lb (58.514 kg)  07/05/15 134 lb (60.782 kg)     ASSESSMENT AND PLAN:  1. Left forearm edema: This is unilateral with mild edema from the elbow to the hand.  This is new onset beginning yesterday.  She denies pain.  There is no heat or evidence of infection.  She is on Plavix 75 mg daily.  No evidence of bleeding under the skin.  No evidence of injury.  I will have a  vascular ultrasound of the left forearm done today to evaluate for decreased circulation.  Also, check a CBC.   2. Status post pacemaker in situ.  Secondary to sinus bradycardia with sinus cause and asystole.she is to continue to see pacemaker clinic, have remote interrogation per protocol and see Dr. Lovena Peters on annual follow-up.   Current medicines are reviewed at length with the patient today.    Labs/ tests ordered today include: vascular ultrasound of the left forearm below the elbow.  D-dimer, and CBC No orders of the defined types were placed in this encounter.     Disposition:   FU with Dr. Lovena Peters.  (Full give advised that over the phone if ultrasound and labs are abnormal.).   Signed, [name], NP  09/17/2015 10:55 AM    [facility] [address] [place], [place] [address] Phone: [phone]; Fax: [phone]

## 2015-09-17 NOTE — ED Notes (Signed)
Patient given meal tray.

## 2015-09-17 NOTE — Progress Notes (Signed)
Name: Jodi Peters    DOB: 1930-05-25  Age: 82 y.o.  MR#: [id number]       PCP:  Rosita Fire, MD      Insurance: Payor: [employer] MEDICARE / Plan: [facility] MEDICARE / Product Type: *No Product type* /   CC:   No chief complaint on file.   VS Filed Vitals:   09/17/15 1405  BP: 158/68  Pulse: 67  Height: 5\' 4"  (1.626 m)  Weight: 130 lb (58.968 kg)  SpO2: 99%    Weights Current Weight  09/17/15 130 lb (58.968 kg)  08/31/15 130 lb 11.7 oz (59.3 kg)  08/20/15 129 lb (58.514 kg)    Blood Pressure  BP Readings from Last 3 Encounters:  09/17/15 158/68  08/31/15 155/59  08/20/15 144/76     Admit date:  (Not on file) Last encounter with RMR:  07/05/2015   Allergy Penicillins  Current Outpatient Prescriptions  Medication Sig Dispense Refill  . acetaminophen (TYLENOL) 500 MG tablet Take 500 mg by mouth every 6 (six) hours as needed for mild pain or moderate pain.    Marland Kitchen acetaZOLAMIDE (DIAMOX) 250 MG tablet Take 250 mg by mouth 2 (two) times daily.     Marland Kitchen amLODipine (NORVASC) 10 MG tablet Take 1 tablet (10 mg total) by mouth daily. 30 tablet 0  . brimonidine (ALPHAGAN) 0.2 % ophthalmic solution Place 1 drop into both eyes 2 (two) times daily.    . Cholecalciferol (VITAMIN D3) 5000 units CAPS Take 1 capsule by mouth daily.    . clopidogrel (PLAVIX) 75 MG tablet Take 1 tablet (75 mg total) by mouth daily. 30 tablet 3  . dorzolamide-timolol (COSOPT) 22.3-6.8 MG/ML ophthalmic solution Place 1 drop into both eyes 2 (two) times daily.     Marland Kitchen latanoprost (XALATAN) 0.005 % ophthalmic solution Place 1 drop into both eyes at bedtime.    . mirtazapine (REMERON) 15 MG tablet Take 15 mg by mouth at bedtime.    . pilocarpine (PILOCAR) 4 % ophthalmic solution Place 1 drop into both eyes 4 (four) times daily.     [name] Glycol-Propyl Glycol (SYSTANE OP) Apply 1 drop to eye as needed (for dry eye relief).     . Simethicone (GAS-X PO) Take 1 tablet by mouth daily as needed (for gas relief).      . simvastatin (ZOCOR) 20 MG tablet Take 20 mg by mouth daily at 6 PM.      No current facility-administered medications for this visit.    Discontinued Meds:   There are no discontinued medications.  Patient Active Problem List   Diagnosis Date Noted  . Sinus node dysfunction ([place]) 08/30/2015  . Near syncope 06/21/2015  . Pain in joint, shoulder region 02/05/2015  . TIA (transient ischemic attack) 01/26/2015  . Leukocytosis 01/18/2015  . UTI (urinary tract infection) 12/29/2014  . Spinal stenosis of thoracic region   . Spinal stenosis 12/22/2014  . Spinal cord compression ([place]) 12/22/2014  . Essential hypertension 12/22/2014  . Spinal stenosis of lumbar region   . Rectocele 06/30/2014  . Cystocele 06/30/2014  . HERPES ZOSTER 01/14/2010  . MITRAL REGURGITATION 01/14/2010  . Cerebrovascular disease 01/14/2010  . INSOMNIA 12/23/2008  . OSTEOPENIA 06/01/2008  . BRADYCARDIA 01/27/2008  . INSUFFICIENCY, VENOUS NOS 01/10/2007  . ALLERGIC RHINITIS, SEASONAL 10/10/2006  . SEBORRHEIC KERATOSIS 08/08/2006  . HYPERLIPIDEMIA 05/23/2006  . Anxiety state 05/23/2006  . CARPAL TUNNEL SYNDROME 05/23/2006  . Glaucoma 05/23/2006  . CATARACT NOS 05/23/2006  . HYPERTENSION 05/23/2006  .  CONSTIPATION 05/23/2006  . IBS 05/23/2006  . OSTEOARTHRITIS 05/23/2006  . LOW BACK PAIN 05/23/2006  . BREAST CANCER, HX OF 05/23/2006    LABS    Component Value Date/Time   NA 146* 08/30/2015 1421   NA 146* 06/23/2015 0657   NA 145 06/22/2015 0600   K 3.8 08/30/2015 1421   K 3.8 06/23/2015 0657   K 3.5 06/22/2015 0600   CL 115* 08/30/2015 1421   CL 114* 06/23/2015 0657   CL 114* 06/22/2015 0600   CO2 17* 08/30/2015 1421   CO2 24 06/23/2015 0657   CO2 24 06/22/2015 0600   GLUCOSE 98 08/30/2015 1421   GLUCOSE 92 06/23/2015 0657   GLUCOSE 91 06/22/2015 0600   BUN 22* 08/30/2015 1421   BUN 20 06/23/2015 0657   BUN 29* 06/22/2015 0600   CREATININE 1.08* 08/30/2015 1421   CREATININE 1.02*  06/23/2015 0657   CREATININE 1.06* 06/22/2015 0600   CALCIUM 10.5* 08/30/2015 1421   CALCIUM 10.1 06/23/2015 0657   CALCIUM 9.4 06/22/2015 0600   GFRNONAA 46* 08/30/2015 1421   GFRNONAA 49* 06/23/2015 0657   GFRNONAA 47* 06/22/2015 0600   GFRAA 53* 08/30/2015 1421   GFRAA 57* 06/23/2015 0657   GFRAA 55* 06/22/2015 0600   CMP     Component Value Date/Time   NA 146* 08/30/2015 1421   K 3.8 08/30/2015 1421   CL 115* 08/30/2015 1421   CO2 17* 08/30/2015 1421   GLUCOSE 98 08/30/2015 1421   BUN 22* 08/30/2015 1421   CREATININE 1.08* 08/30/2015 1421   CALCIUM 10.5* 08/30/2015 1421   PROT 6.0* 06/22/2015 0600   ALBUMIN 3.4* 06/22/2015 0600   AST 13* 06/22/2015 0600   ALT 9* 06/22/2015 0600   ALKPHOS 77 06/22/2015 0600   BILITOT 0.6 06/22/2015 0600   GFRNONAA 46* 08/30/2015 1421   GFRAA 53* 08/30/2015 1421       Component Value Date/Time   WBC 6.8 08/30/2015 1421   WBC 8.0 06/22/2015 0600   WBC 8.0 06/21/2015 1400   HGB 13.3 08/30/2015 1421   HGB 11.0* 06/22/2015 0600   HGB 12.4 06/21/2015 1400   HCT 41.3 08/30/2015 1421   HCT 34.2* 06/22/2015 0600   HCT 39.3 06/21/2015 1400   MCV 87.3 08/30/2015 1421   MCV 89.1 06/22/2015 0600   MCV 90.3 06/21/2015 1400    Lipid Panel     Component Value Date/Time   CHOL 184 02/23/2015 0920   TRIG 90 02/23/2015 0920   HDL 59 02/23/2015 0920   CHOLHDL 3.1 02/23/2015 0920   VLDL 18 02/23/2015 0920   LDLCALC 107* 02/23/2015 0920    ABG    Component Value Date/Time   TCO2 22 01/26/2015 1528     Lab Results  Component Value Date   TSH 2.084 06/23/2015   BNP (last 3 results) No results for input(s): BNP in the last 8760 hours.  ProBNP (last 3 results) No results for input(s): PROBNP in the last 8760 hours.  Cardiac Panel (last 3 results) No results for input(s): CKTOTAL, CKMB, TROPONINI, RELINDX in the last 72 hours.  Iron/TIBC/Ferritin/ %Sat No results found for: IRON, TIBC, FERRITIN, IRONPCTSAT   EKG Orders placed  or performed during the hospital encounter of 08/30/15  . EKG 12-Lead  . EKG 12-Lead  . EKG 12-Lead  . EKG 12-Lead  . EKG 12-Lead in am (before 8am)  . EKG 12-Lead in am (before 8am)     Prior Assessment and Plan Problem List as of 09/17/2015  Cardiovascular and Mediastinum   MITRAL REGURGITATION   HYPERTENSION   Last Assessment & Plan 04/01/2013 Office Visit Written 04/01/2013  5:45 PM by Lendon Colonel, NP     Blood pressure is elevated initially. On triage however her blood pressure was 147/65. One static blood pressures were completed in the office. Lying 171/68, sitting 167/69, standing 147/65. As stated a low her heart rate did drop from 67-40 from lying to standing position.  I have reviewed this with Dr. Pennelope Bracken, on site, to evaluate need to adjust current medication regimen which includes amlodipine and benazepril. Dr. Legrand Rams stop her chlorthalidone on recent office visit. It is his advisement that we keep her on her current medication regimen and follow her cardiac monitor on current medications. He will see her in the office on followup in approximately 2 weeks to one month.      BRADYCARDIA   Last Assessment & Plan 04/01/2013 Office Visit Written 04/01/2013  5:44 PM by Lendon Colonel, NP    The patient had orthostatic blood pressures completed and was found to be bradycardic with position change. Lying heart rate was 67, sitting heart rate 50, standing heart rate 40 beats per minute. The patient had some dizziness associated with position change. I will plan a CardioNet monitor for the next 10 days. This will evaluate her heart rate throughout the day, with advisement to right symptoms down in a law which is attached. She is advised not to take positions quickly. She is to take her time getting up or lying or sitting position. She verbalizes understanding.      Cerebrovascular disease   INSUFFICIENCY, VENOUS NOS   Essential hypertension   TIA (transient ischemic  attack)   Near syncope   Sinus node dysfunction ([facility])     Respiratory   ALLERGIC RHINITIS, SEASONAL     Digestive   CONSTIPATION   IBS   Rectocele     Nervous and Auditory   CARPAL TUNNEL SYNDROME   Spinal cord compression ([facility])     Musculoskeletal and Integument   SEBORRHEIC KERATOSIS   OSTEOARTHRITIS   OSTEOPENIA     Genitourinary   Cystocele   UTI (urinary tract infection)     Other   HERPES ZOSTER   HYPERLIPIDEMIA   Anxiety state   Glaucoma   CATARACT NOS   LOW BACK PAIN   INSOMNIA   BREAST CANCER, HX OF   Spinal stenosis   Spinal stenosis of lumbar region   Spinal stenosis of thoracic region   Leukocytosis   Pain in joint, shoulder region       Imaging: Dg Chest 2 View  08/31/2015  CLINICAL DATA:  Chest soreness following permanent pacemaker placement EXAM: CHEST  2 VIEW COMPARISON:  Chest x-ray of June 21, 2015 FINDINGS: The lungs are adequately inflated. There is no focal infiltrate. There is no pleural effusion. The heart and pulmonary vascularity are normal. The mediastinum is normal in width. The permanent pacemaker generator overlies the left pectoral region. Positioning of the electrodes is radiographically good. There is multilevel degenerative disc disease of the thoracic spine. IMPRESSION: There is no postprocedure complication following permanent pacemaker placement. Electronically Signed   By: David  Martinique M.D.   On: 08/31/2015 07:43

## 2015-09-17 NOTE — Addendum Note (Signed)
Addended by: Barbarann Ehlers A on: 09/17/2015 04:01 PM   Modules accepted: Orders

## 2015-09-17 NOTE — Telephone Encounter (Signed)
Pacemaker Inserted 2 weeks ago,swelling noted since yesterday and feels her wrist and hand swollen .Denies numbness,swelling.States "I just noted I couldn't put my fingers around my left wrist"   Apt made to see NP today

## 2015-09-17 NOTE — Patient Instructions (Signed)
Medication Instructions:  Your physician recommends that you continue on your current medications as directed. Please refer to the Current Medication list given to you today.   Labwork: Your physician recommends that you return for lab work in: TODAY D-DIMER   Testing/Procedures: Your physician has requested that you have a upper extremity venous duplex. This test is an ultrasound of the veins in the legs or arms. It looks at venous blood flow that carries blood from the heart to the legs or arms. Allow one hour for a Lower Venous exam. Allow thirty minutes for an Upper Venous exam. There are no restrictions or special instructions.     Follow-Up: Your physician recommends that you schedule a follow-up appointment in: TO BE DETERMINED   Any Other Special Instructions Will Be Listed Below (If Applicable).     If you need a refill on your cardiac medications before your next appointment, please call your pharmacy.

## 2015-09-17 NOTE — Progress Notes (Signed)
Contacted by ER Dr Lacinda Axon regarding patient with upper extremity DVT. Would start eliquis 10mg  bid x 7 days. Please make sure that when labs return her platelets and Hgb are within normal limits, Given her history of crypogenic stroke and being on plavix previously, we will continue plavix at this time, this can be readdressed by her primary cardiologist at follow up. I will message our office to arrange f/u in 2-3 weeks.    Zandra Abts MD

## 2015-09-17 NOTE — Discharge Instructions (Signed)
Deep Vein Thrombosis °A deep vein thrombosis (DVT) is a blood clot (thrombus) that usually occurs in a deep, larger vein of the lower leg or the pelvis, or in an upper extremity such as the arm. These are dangerous and can lead to serious and even life-threatening complications if the clot travels to the lungs. °A DVT can damage the valves in your leg veins so that instead of flowing upward, the blood pools in the lower leg. This is called post-thrombotic syndrome, and it can result in pain, swelling, discoloration, and sores on the leg. °CAUSES °A DVT is caused by the formation of a blood clot in your leg, pelvis, or arm. Usually, several things contribute to the formation of blood clots. A clot may develop when: °· Your blood flow slows down. °· Your vein becomes damaged in some way. °· You have a condition that makes your blood clot more easily. °RISK FACTORS °A DVT is more likely to develop in: °· People who are older, especially over 60 years of age. °· People who are overweight (obese). °· People who sit or lie still for a long time, such as during long-distance travel (over 4 hours), bed rest, hospitalization, or during recovery from certain medical conditions like a stroke. °· People who do not engage in much physical activity (sedentary lifestyle). °· People who have chronic breathing disorders. °· People who have a personal or family history of blood clots or blood clotting disease. °· People who have peripheral vascular disease (PVD), diabetes, or some types of cancer. °· People who have heart disease, especially if the person had a recent heart attack or has congestive heart failure. °· People who have neurological diseases that affect the legs (leg paresis). °· People who have had a traumatic injury, such as breaking a hip or leg. °· People who have recently had major or lengthy surgery, especially on the hip, knee, or abdomen. °· People who have had a central line placed inside a large vein. °· People  who take medicines that contain the hormone estrogen. These include birth control pills and hormone replacement therapy. °· Pregnancy or during childbirth or the postpartum period. °· Long plane flights (over 8 hours). °SIGNS AND SYMPTOMS °Symptoms of a DVT can include:  °· Swelling of your leg or arm, especially if one side is much worse. °· Warmth and redness of your leg or arm, especially if one side is much worse. °· Pain in your arm or leg. If the clot is in your leg, symptoms may be more noticeable or worse when you stand or walk. °· A feeling of pins and needles, if the clot is in the arm. °The symptoms of a DVT that has traveled to the lungs (pulmonary embolism, PE) usually start suddenly and include: °· Shortness of breath while active or at rest. °· Coughing or coughing up blood or blood-tinged mucus. °· Chest pain that is often worse with deep breaths. °· Rapid or irregular heartbeat. °· Feeling light-headed or dizzy. °· Fainting. °· Feeling anxious. °· Sweating. °There may also be pain and swelling in a leg if that is where the blood clot started. °These symptoms may represent a serious problem that is an emergency. Do not wait to see if the symptoms will go away. Get medical help right away. Call your local emergency services (911 in the [place] Do not drive yourself to the hospital. °DIAGNOSIS °Your health care provider will take a medical history and perform a physical exam. You may also   have other tests, including: °· Blood tests to assess the clotting properties of your blood. °· Imaging tests, such as CT, ultrasound, MRI, X-ray, and other tests to see if you have clots anywhere in your body. °TREATMENT °After a DVT is identified, it can be treated. The type of treatment that you receive depends on many factors, such as the cause of your DVT, your risk for bleeding or developing more clots, and other medical conditions that you have. Sometimes, a combination of treatments is necessary. Treatment  options may be combined and include: °· Monitoring the blood clot with ultrasound. °· Taking medicines by mouth, such as newer blood thinners (anticoagulants), thrombolytics, or warfarin. °· Taking anticoagulant medicine by injection or through an IV tube. °· Wearing compression stockings or using different types of devices. °· Surgery (rare) to remove the blood clot or to place a filter in your abdomen to stop the blood clot from traveling to your lungs. °Treatments for a DVT are often divided into immediate treatment and long-term treatment (up to 3 months after DVT). You can work with your health care provider to choose the treatment program that is best for you. °HOME CARE INSTRUCTIONS °If you are taking a newer oral anticoagulant: °· Take the medicine every single day at the same time each day. °· Understand what foods and drugs interact with this medicine. °· Understand that there are no regular blood tests required when using this medicine. °· Understand the side effects of this medicine, including excessive bruising or bleeding. Ask your health care provider or pharmacist about other possible side effects. °If you are taking warfarin: °· Understand how to take warfarin and know which foods can affect how warfarin works in your body. °· Understand that it is dangerous to take too much or too little warfarin. Too much warfarin increases the risk of bleeding. Too little warfarin continues to allow the risk for blood clots. °· Follow your PT and INR blood testing schedule. The PT and INR results allow your health care provider to adjust your dose of warfarin. It is very important that you have your PT and INR tested as often as told by your health care provider. °· Avoid major changes in your diet, or tell your health care provider before you change your diet. Arrange a visit with a registered dietitian to answer your questions. Many foods, especially foods that are high in vitamin K, can interfere with warfarin  and affect the PT and INR results. Eat a consistent amount of foods that are high in vitamin K, such as: °¨ Spinach, kale, broccoli, cabbage, collard greens, turnip greens, Brussels sprouts, peas, cauliflower, seaweed, and parsley. °¨ Beef liver and pork liver. °¨ Green tea. °¨ Soybean oil. °· Tell your health care provider about any and all medicines, vitamins, and supplements that you take, including aspirin and other over-the-counter anti-inflammatory medicines. Be especially cautious with aspirin and anti-inflammatory medicines. Do not take those before you ask your health care provider if it is safe to do so. This is important because many medicines can interfere with warfarin and affect the PT and INR results. °· Do not start or stop taking any over-the-counter or prescription medicine unless your health care provider or pharmacist tells you to do so. °If you take warfarin, you will also need to do these things: °· Hold pressure over cuts for longer than usual. °· Tell your dentist and other health care providers that you are taking warfarin before you have any procedures in which   bleeding may occur. °· Avoid alcohol or drink very small amounts. Tell your health care provider if you change your alcohol intake. °· Do not use tobacco products, including cigarettes, chewing tobacco, and e-cigarettes. If you need help quitting, ask your health care provider. °· Avoid contact sports. °General Instructions °· Take over-the-counter and prescription medicines only as told by your health care provider. Anticoagulant medicines can have side effects, including easy bruising and difficulty stopping bleeding. If you are prescribed an anticoagulant, you will also need to do these things: °¨ Hold pressure over cuts for longer than usual. °¨ Tell your dentist and other health care providers that you are taking anticoagulants before you have any procedures in which bleeding may occur. °¨ Avoid contact sports. °· Wear a medical  alert bracelet or carry a medical alert card that says you have had a PE. °· Ask your health care provider how soon you can go back to your normal activities. Stay active to prevent new blood clots from forming. °· Make sure to exercise while traveling or when you have been sitting or standing for a long period of time. It is very important to exercise. Exercise your legs by walking or by tightening and relaxing your leg muscles often. Take frequent walks. °· Wear compression stockings as told by your health care provider to help prevent more blood clots from forming. °· Do not use tobacco products, including cigarettes, chewing tobacco, and e-cigarettes. If you need help quitting, ask your health care provider. °· Keep all follow-up appointments with your health care provider. This is important. °PREVENTION °Take these actions to decrease your risk of developing another DVT: °· Exercise regularly. For at least 30 minutes every day, engage in: °¨ Activity that involves moving your arms and legs. °¨ Activity that encourages good blood flow through your body by increasing your heart rate. °· Exercise your arms and legs every hour during long-distance travel (over 4 hours). Drink plenty of water and avoid drinking alcohol while traveling. °· Avoid sitting or lying in bed for long periods of time without moving your legs. °· Maintain a weight that is appropriate for your height. Ask your health care provider what weight is healthy for you. °· If you are a woman who is over 35 years of age, avoid unnecessary use of medicines that contain estrogen. These include birth control pills. °· Do not smoke, especially if you take estrogen medicines. If you need help quitting, ask your health care provider. °If you are hospitalized, prevention measures may include: °· Early walking after surgery, as soon as your health care provider says that it is safe. °· Receiving anticoagulants to prevent blood clots. If you cannot take  anticoagulants, other options may be available, such as wearing compression stockings or using different types of devices. °SEEK IMMEDIATE MEDICAL CARE IF: °· You have new or increased pain, swelling, or redness in an arm or leg. °· You have numbness or tingling in an arm or leg. °· You have shortness of breath while active or at rest. °· You have chest pain. °· You have a rapid or irregular heartbeat. °· You feel light-headed or dizzy. °· You cough up blood. °· You notice blood in your vomit, bowel movement, or urine. °These symptoms may represent a serious problem that is an emergency. Do not wait to see if the symptoms will go away. Get medical help right away. Call your local emergency services (911 in the [place] Do not drive yourself to the hospital. °  °  This information is not intended to replace advice given to you by your health care provider. Make sure you discuss any questions you have with your health care provider.   Document Released: 05/29/2005 Document Revised: 02/17/2015 Document Reviewed: 09/23/2014 Elsevier Interactive Patient Education [employer].   Take medications as prescribed. Call the cardiologist and schedule an outpatient visit for approximately 2 weeks.

## 2015-09-18 ENCOUNTER — Telehealth: Payer: Self-pay | Admitting: Cardiology

## 2015-09-18 NOTE — Telephone Encounter (Signed)
Pt has upper ext DVT, and was seen in ER at [facility], placed on Eliquis 10 mg BID for 7 days then 5 mg BID- her pharmacy said they would not fill until insurance cleared and she could not pay $300  I talked to Care manager at [facility] -great help!  Will arrange for 30 day free card and ask our office to get approval on Monday.

## 2015-09-18 NOTE — Progress Notes (Signed)
CM received call from NP requesting help with Eliquis cost for pt.  CM called pt to see which pharmacy her Eliquis was sent to; [facility].  CM called [facility] with free 30 day trial card BIN, PCN, GRP, and ID number.  Pharmacist confirms card "went through" and pt can pick up her Eliquis free of charge.  Cm called pt who is appreciative and is on her way to pick up medication. No other CM needs were communicated.

## 2015-09-20 NOTE — Telephone Encounter (Signed)
Patient given 30 day free coupon,plus I have 1 month samples in office for her.

## 2015-09-20 NOTE — Telephone Encounter (Signed)
Per Dr Jarold Motto is to remain on Eliquis for 3 months.I have provided her now with 2 months samples which in addition to her 30 day trial card,she is covered with enough medication as the cost is $300 monthly to her

## 2015-09-20 NOTE — Telephone Encounter (Signed)
Thank you for doing that Mickel Baas!  Will forward to nurses here in [place] to contact her.

## 2015-10-09 ENCOUNTER — Telehealth: Payer: Self-pay | Admitting: Physician Assistant

## 2015-10-09 NOTE — Telephone Encounter (Signed)
Patient with h/o bradycardia s/p medtronic dual chamber PPM on 3/20 called after hour answering service as her bedside remote transmission my care device's light went off. His son came over and adjusted the power cable and the light came on. She was unable to tell me any contact info on the back of device and says when she picked it up, the light back on. She did try a remote transmission. Other than that, she denies any symptom such as dizziness, weakness or presyncope/sycope.   I have called [employer] [facility] who told me their technical department is off today because of weekend, however they were able to transfer to me [facility] who are on call to assist hospitals. Per [facility] representative, there was no transmission today. Her last remote transmission was 3/20 at the paced she had 23% atrial pace and 0.3% ventricular pace. They advised me to check her power cable and make sure device is located where can receive phone signal.   I called the patient again, her son has came over and re-pluged the device. I have instructed her that if no symptom of dizziness, weakness or syncope/presycope, she can contact our office on Monday, if she has any of those symptoms, she needs to seek medical attention immediately.  > than 1 hour spent trying to help this patient.   Hilbert Corrigan PA Pager: [phone]

## 2015-10-28 DIAGNOSIS — I119 Hypertensive heart disease without heart failure: Secondary | ICD-10-CM | POA: Diagnosis not present

## 2015-10-28 DIAGNOSIS — I82602 Acute embolism and thrombosis of unspecified veins of left upper extremity: Secondary | ICD-10-CM | POA: Diagnosis not present

## 2015-10-28 DIAGNOSIS — R001 Bradycardia, unspecified: Secondary | ICD-10-CM | POA: Diagnosis not present

## 2015-10-28 DIAGNOSIS — I340 Nonrheumatic mitral (valve) insufficiency: Secondary | ICD-10-CM | POA: Diagnosis not present

## 2015-11-24 DIAGNOSIS — Z961 Presence of intraocular lens: Secondary | ICD-10-CM | POA: Diagnosis not present

## 2015-11-24 DIAGNOSIS — H401133 Primary open-angle glaucoma, bilateral, severe stage: Secondary | ICD-10-CM | POA: Diagnosis not present

## 2015-11-24 DIAGNOSIS — H25811 Combined forms of age-related cataract, right eye: Secondary | ICD-10-CM | POA: Diagnosis not present

## 2015-11-25 DIAGNOSIS — R600 Localized edema: Secondary | ICD-10-CM | POA: Diagnosis not present

## 2015-11-25 DIAGNOSIS — N183 Chronic kidney disease, stage 3 (moderate): Secondary | ICD-10-CM | POA: Diagnosis not present

## 2015-11-25 DIAGNOSIS — I442 Atrioventricular block, complete: Secondary | ICD-10-CM | POA: Diagnosis not present

## 2015-11-25 DIAGNOSIS — I119 Hypertensive heart disease without heart failure: Secondary | ICD-10-CM | POA: Diagnosis not present

## 2015-11-29 ENCOUNTER — Encounter: Payer: Self-pay | Admitting: Internal Medicine

## 2015-11-29 ENCOUNTER — Ambulatory Visit (INDEPENDENT_AMBULATORY_CARE_PROVIDER_SITE_OTHER): Payer: Medicare Other | Admitting: Internal Medicine

## 2015-11-29 ENCOUNTER — Encounter: Payer: Medicare Other | Admitting: Internal Medicine

## 2015-11-29 VITALS — BP 168/58 | HR 79 | Ht 64.5 in | Wt 126.0 lb

## 2015-11-29 DIAGNOSIS — R001 Bradycardia, unspecified: Secondary | ICD-10-CM

## 2015-11-29 LAB — CUP PACEART INCLINIC DEVICE CHECK
Battery Remaining Longevity: 130 mo
Brady Statistic AP VS Percent: 54.65 %
Brady Statistic RV Percent Paced: 0.05 %
Implantable Lead Location: 753860
Implantable Lead Model: 5076
Lead Channel Impedance Value: 342 Ohm
Lead Channel Impedance Value: 589 Ohm
Lead Channel Pacing Threshold Pulse Width: 0.4 ms
Lead Channel Pacing Threshold Pulse Width: 0.4 ms
Lead Channel Sensing Intrinsic Amplitude: 2.5 mV
Lead Channel Sensing Intrinsic Amplitude: 24.625 mV
Lead Channel Sensing Intrinsic Amplitude: 3.75 mV
Lead Channel Setting Pacing Amplitude: 2 V
Lead Channel Setting Sensing Sensitivity: 2.8 mV
MDC IDC LEAD IMPLANT DT: 20170320
MDC IDC LEAD IMPLANT DT: 20170320
MDC IDC LEAD LOCATION: 753859
MDC IDC MSMT BATTERY VOLTAGE: 3.05 V
MDC IDC MSMT LEADCHNL RA IMPEDANCE VALUE: 475 Ohm
MDC IDC MSMT LEADCHNL RA PACING THRESHOLD AMPLITUDE: 1 V
MDC IDC MSMT LEADCHNL RV IMPEDANCE VALUE: 475 Ohm
MDC IDC MSMT LEADCHNL RV PACING THRESHOLD AMPLITUDE: 1 V
MDC IDC MSMT LEADCHNL RV SENSING INTR AMPL: 28.25 mV
MDC IDC SESS DTM: 20170619101911
MDC IDC SET LEADCHNL RA PACING AMPLITUDE: 2 V
MDC IDC SET LEADCHNL RV PACING PULSEWIDTH: 0.4 ms
MDC IDC STAT BRADY AP VP PERCENT: 0.03 %
MDC IDC STAT BRADY AS VP PERCENT: 0.02 %
MDC IDC STAT BRADY AS VS PERCENT: 45.3 %
MDC IDC STAT BRADY RA PERCENT PACED: 54.68 %

## 2015-11-29 NOTE — Patient Instructions (Signed)
Your physician wants you to follow-up in: 9 Months with Dr. Lovena Le. You will receive a reminder letter in the mail two months in advance. If you don't receive a letter, please call our office to schedule the follow-up appointment.  Remote monitoring is used to monitor your Pacemaker of ICD from home. This monitoring reduces the number of office visits required to check your device to one time per year. It allows [place] to keep an eye on the functioning of your device to ensure it is working properly. You are scheduled for a device check from home on 02/28/16 y send your transmission at any time that day. If you have a wireless device, the transmission will be sent automatically. After your physician reviews your transmission, you will receive a postcard with your next transmission date.  Your physician recommends that you continue on your current medications as directed. Please refer to the Current Medication list given to you today.  If you need a refill on your cardiac medications before your next appointment, please call your pharmacy.  Thank you for choosing [facility]!

## 2015-11-29 NOTE — Progress Notes (Signed)
HPI Jodi Peters returns today for PPM followup. She is a pleasant 82 yo woman with HTN and symptomatic bradycardia who underwent PPM insertion approx 3 months ago. In the interim, she has done well but her family has prevented her from being active. She sits a lot.   Current Outpatient Prescriptions  Medication Sig Dispense Refill  . acetaminophen (TYLENOL) 500 MG tablet Take 500 mg by mouth every 6 (six) hours as needed for mild pain or moderate pain.    Marland Kitchen acetaZOLAMIDE (DIAMOX) 250 MG tablet Take 250 mg by mouth 2 (two) times daily.     Marland Kitchen amLODipine (NORVASC) 10 MG tablet Take 1 tablet (10 mg total) by mouth daily. 30 tablet 0  . apixaban (ELIQUIS) 5 MG TABS tablet Take 1 tablet (5 mg total) by mouth 2 (two) times daily. 40 tablet 0  . aspirin EC 81 MG tablet Take 81 mg by mouth every morning.    . brimonidine (ALPHAGAN) 0.2 % ophthalmic solution Place 1 drop into both eyes 2 (two) times daily.    . Cholecalciferol (VITAMIN D3) 5000 units CAPS Take 1 capsule by mouth daily.    . clopidogrel (PLAVIX) 75 MG tablet Take 1 tablet (75 mg total) by mouth daily. 30 tablet 3  . dorzolamide-timolol (COSOPT) 22.3-6.8 MG/ML ophthalmic solution Place 1 drop into both eyes 2 (two) times daily.     . furosemide (LASIX) 20 MG tablet Take 20 mg by mouth every morning.    . latanoprost (XALATAN) 0.005 % ophthalmic solution Place 1 drop into both eyes at bedtime.    Marland Kitchen lisinopril (PRINIVIL,ZESTRIL) 40 MG tablet     . mirtazapine (REMERON) 15 MG tablet Take 15 mg by mouth at bedtime.    . pilocarpine (PILOCAR) 4 % ophthalmic solution Place 1 drop into both eyes 4 (four) times daily.     [name] Glycol-Propyl Glycol (SYSTANE OP) Apply 1 drop to eye as needed (for dry eye relief).     . Simethicone (GAS-X PO) Take 1 tablet by mouth daily as needed (for gas relief).     . simvastatin (ZOCOR) 20 MG tablet Take 20 mg by mouth daily at 6 PM.      No current facility-administered medications for this  visit.     Past Medical History  Diagnosis Date  . Hyperlipidemia   . Hypertension   . Mitral regurgitation     sinus bradycardia  . Breast pain     leftr  . Venous insufficiency   . Allergic rhinitis   . Seborrheic keratosis   . Breast mass     left  . Vertigo   . Carpal tunnel syndrome   . Cataract   . Glaucoma   . Constipation   . IBS (irritable bowel syndrome)   . Osteopenia   . Osteoarthritis   . Low back pain   . Breast cancer ([place])   . Anxiety   . Vitamin D deficiency   . Bradycardia   . Carotid atherosclerosis   . Rectocele 06/30/2014  . Cystocele 06/30/2014    ROS:   All systems reviewed and negative except as noted in the HPI.   Past Surgical History  Procedure Laterality Date  . Appendectomy    . Cholecystectomy    . Keloid      left ear  . Eye surgery      for bleed in left eye  . Cyst removed from left breast    . Spurs  on back    . Back surgery    . Abdominal hysterectomy    . Lumbar laminectomy/decompression microdiscectomy N/A 12/25/2014    Procedure: Thoracic eleven-twelve Laminectomy/Diskectomy; Lumbar three-four Laminectomy/Diskectomy;  Surgeon: Kristeen Miss, MD;  Location: [facility];  Service: Neurosurgery;  Laterality: N/A;  . Breast lumpectomy    . Ep implantable device N/A 08/30/2015    Procedure: Pacemaker Implant;  Surgeon: Evans Lance, MD;  Location: [facility];  Service: Cardiovascular;  Laterality: N/A;     Family History  Problem Relation Age of Onset  . Heart disease Mother   . Hypertension Mother   . Diabetes Mother   . Other Daughter     left breast nodule  . Hypertension Son   . Hyperlipidemia Son   . Heart disease Maternal Grandmother   . Hypertension Maternal Grandmother   . Hypertension Son   . Other Son     boils     Social History   Social History  . Marital Status: Divorced    Spouse Name: N/A  . Number of Children: N/A  . Years of Education: N/A   Occupational History  . Not on  file.   Social History Main Topics  . Smoking status: Never Smoker   . Smokeless tobacco: Never Used  . Alcohol Use: No  . Drug Use: No  . Sexual Activity: No     Comment: hyst   Other Topics Concern  . Not on file   Social History Narrative     BP 168/58 mmHg  Pulse 79  Ht 5' 4.5" (1.638 m)  Wt 126 lb (57.153 kg)  BMI 21.30 kg/m2  SpO2 95%  Physical Exam:  Well appearing 82 yo woman, NAD HEENT: Unremarkable except for large keloid on left ear Neck:  6 cm JVD, no thyromegally Lymphatics:  No adenopathy Back:  No CVA tenderness Lungs:  Clear with no wheezes rales, or rhonchi. No keloid at her incision site. HEART:  Regular rate rhythm, no murmurs, no rubs, no clicks Abd:  soft, positive bowel sounds, no organomegally, no rebound, no guarding Ext:  2 plus pulses, no edema, no cyanosis, no clubbing Skin:  No rashes no nodules Neuro:  CN II through XII intact, motor grossly intact   Assess/Plan: 1. Symptomatic sinus node dysfunction - she is doing well, s/p PPM insertion 2. Dizziness due to #1 3. HTN - her blood pressure is good. She will continue her current meds. 4. PM - her medtronic DDD PM is working normally. Will recheck in several months.  Mikle Bosworth.D.

## 2015-12-01 ENCOUNTER — Telehealth: Payer: Self-pay

## 2015-12-01 ENCOUNTER — Other Ambulatory Visit: Payer: Self-pay

## 2015-12-01 MED ORDER — APIXABAN 5 MG PO TABS: 5.0000 mg | ORAL_TABLET | Freq: Two times a day (BID) | ORAL | Status: DC

## 2015-12-01 NOTE — Telephone Encounter (Signed)
Prior auth obtained for Eliquis 5mg  through [facility]/ [place]- [id number] is good through 06/11/2016.

## 2015-12-23 DIAGNOSIS — I442 Atrioventricular block, complete: Secondary | ICD-10-CM | POA: Diagnosis not present

## 2015-12-23 DIAGNOSIS — I82602 Acute embolism and thrombosis of unspecified veins of left upper extremity: Secondary | ICD-10-CM | POA: Diagnosis not present

## 2015-12-23 DIAGNOSIS — I10 Essential (primary) hypertension: Secondary | ICD-10-CM | POA: Diagnosis not present

## 2015-12-23 DIAGNOSIS — N183 Chronic kidney disease, stage 3 (moderate): Secondary | ICD-10-CM | POA: Diagnosis not present

## 2016-02-28 ENCOUNTER — Telehealth: Payer: Self-pay | Admitting: Cardiology

## 2016-02-28 ENCOUNTER — Ambulatory Visit (INDEPENDENT_AMBULATORY_CARE_PROVIDER_SITE_OTHER): Payer: Medicare Other | Admitting: *Deleted

## 2016-02-28 DIAGNOSIS — Z950 Presence of cardiac pacemaker: Secondary | ICD-10-CM

## 2016-02-28 DIAGNOSIS — R001 Bradycardia, unspecified: Secondary | ICD-10-CM

## 2016-02-28 NOTE — Progress Notes (Signed)
Remote pacemaker transmission.   

## 2016-02-28 NOTE — Telephone Encounter (Signed)
LMOVM reminding pt to send remote transmission.   

## 2016-03-01 ENCOUNTER — Encounter: Payer: Self-pay | Admitting: Cardiology

## 2016-03-13 LAB — CUP PACEART REMOTE DEVICE CHECK
Battery Remaining Longevity: 119 mo
Battery Voltage: 3.03 V
Brady Statistic AP VP Percent: 0.02 %
Brady Statistic RA Percent Paced: 52.86 %
Brady Statistic RV Percent Paced: 0.05 %
Implantable Lead Implant Date: 20170320
Implantable Lead Location: 753860
Lead Channel Impedance Value: 437 Ohm
Lead Channel Impedance Value: 532 Ohm
Lead Channel Pacing Threshold Amplitude: 0.75 V
Lead Channel Pacing Threshold Pulse Width: 0.4 ms
Lead Channel Sensing Intrinsic Amplitude: 2.25 mV
Lead Channel Sensing Intrinsic Amplitude: 2.25 mV
Lead Channel Sensing Intrinsic Amplitude: 22.625 mV
Lead Channel Setting Pacing Amplitude: 1.5 V
Lead Channel Setting Pacing Amplitude: 2 V
Lead Channel Setting Sensing Sensitivity: 2.8 mV
MDC IDC LEAD IMPLANT DT: 20170320
MDC IDC LEAD LOCATION: 753859
MDC IDC MSMT LEADCHNL RA IMPEDANCE VALUE: 323 Ohm
MDC IDC MSMT LEADCHNL RA IMPEDANCE VALUE: 456 Ohm
MDC IDC MSMT LEADCHNL RV PACING THRESHOLD AMPLITUDE: 0.875 V
MDC IDC MSMT LEADCHNL RV PACING THRESHOLD PULSEWIDTH: 0.4 ms
MDC IDC MSMT LEADCHNL RV SENSING INTR AMPL: 22.625 mV
MDC IDC SESS DTM: 20170918165215
MDC IDC SET LEADCHNL RV PACING PULSEWIDTH: 0.4 ms
MDC IDC STAT BRADY AP VS PERCENT: 52.84 %
MDC IDC STAT BRADY AS VP PERCENT: 0.03 %
MDC IDC STAT BRADY AS VS PERCENT: 47.11 %

## 2016-05-29 ENCOUNTER — Ambulatory Visit (INDEPENDENT_AMBULATORY_CARE_PROVIDER_SITE_OTHER): Payer: Medicare Other | Admitting: *Deleted

## 2016-05-29 ENCOUNTER — Telehealth: Payer: Self-pay | Admitting: Cardiology

## 2016-05-29 DIAGNOSIS — R001 Bradycardia, unspecified: Secondary | ICD-10-CM

## 2016-05-29 NOTE — Telephone Encounter (Signed)
Spoke with pt and reminded pt of remote transmission that is due today. Pt verbalized understanding.   

## 2016-05-30 LAB — CUP PACEART REMOTE DEVICE CHECK
Battery Remaining Longevity: 115 mo
Battery Voltage: 3.03 V
Brady Statistic AP VP Percent: 0.01 %
Brady Statistic AS VS Percent: 50.44 %
Brady Statistic RV Percent Paced: 0.04 %
Date Time Interrogation Session: 20171218200221
Implantable Lead Implant Date: 20170320
Implantable Lead Location: 753859
Implantable Lead Model: 5076
Implantable Pulse Generator Implant Date: 20170320
Lead Channel Impedance Value: 437 Ohm
Lead Channel Impedance Value: 513 Ohm
Lead Channel Impedance Value: 532 Ohm
Lead Channel Pacing Threshold Amplitude: 0.75 V
Lead Channel Pacing Threshold Amplitude: 0.875 V
Lead Channel Sensing Intrinsic Amplitude: 3 mV
Lead Channel Setting Pacing Amplitude: 1.5 V
Lead Channel Setting Pacing Amplitude: 2 V
Lead Channel Setting Sensing Sensitivity: 2.8 mV
MDC IDC LEAD IMPLANT DT: 20170320
MDC IDC LEAD LOCATION: 753860
MDC IDC MSMT LEADCHNL RA IMPEDANCE VALUE: 361 Ohm
MDC IDC MSMT LEADCHNL RA PACING THRESHOLD PULSEWIDTH: 0.4 ms
MDC IDC MSMT LEADCHNL RA SENSING INTR AMPL: 3 mV
MDC IDC MSMT LEADCHNL RV PACING THRESHOLD PULSEWIDTH: 0.4 ms
MDC IDC MSMT LEADCHNL RV SENSING INTR AMPL: 19.875 mV
MDC IDC MSMT LEADCHNL RV SENSING INTR AMPL: 19.875 mV
MDC IDC SET LEADCHNL RV PACING PULSEWIDTH: 0.4 ms
MDC IDC STAT BRADY AP VS PERCENT: 49.52 %
MDC IDC STAT BRADY AS VP PERCENT: 0.02 %
MDC IDC STAT BRADY RA PERCENT PACED: 49.53 %

## 2016-05-30 NOTE — Progress Notes (Signed)
Remote pacemaker transmission.   

## 2016-05-31 ENCOUNTER — Encounter: Payer: Self-pay | Admitting: Cardiology

## 2016-09-15 ENCOUNTER — Ambulatory Visit (INDEPENDENT_AMBULATORY_CARE_PROVIDER_SITE_OTHER): Payer: Medicare Other | Admitting: Internal Medicine

## 2016-09-15 ENCOUNTER — Encounter: Payer: Self-pay | Admitting: Internal Medicine

## 2016-09-15 DIAGNOSIS — I495 Sick sinus syndrome: Secondary | ICD-10-CM

## 2016-09-15 LAB — CUP PACEART INCLINIC DEVICE CHECK
Brady Statistic AP VP Percent: 0.02 %
Brady Statistic AS VP Percent: 0.02 %
Brady Statistic RA Percent Paced: 52.63 %
Brady Statistic RV Percent Paced: 0.04 %
Date Time Interrogation Session: 20180406114940
Implantable Lead Implant Date: 20170320
Implantable Lead Location: 753860
Implantable Lead Model: 5076
Lead Channel Impedance Value: 456 Ohm
Lead Channel Impedance Value: 532 Ohm
Lead Channel Pacing Threshold Pulse Width: 0.4 ms
Lead Channel Sensing Intrinsic Amplitude: 21.25 mV
Lead Channel Setting Pacing Amplitude: 1.5 V
Lead Channel Setting Pacing Amplitude: 2 V
Lead Channel Setting Pacing Pulse Width: 0.4 ms
MDC IDC LEAD IMPLANT DT: 20170320
MDC IDC LEAD LOCATION: 753859
MDC IDC MSMT BATTERY REMAINING LONGEVITY: 115 mo
MDC IDC MSMT BATTERY VOLTAGE: 3.03 V
MDC IDC MSMT LEADCHNL RA IMPEDANCE VALUE: 380 Ohm
MDC IDC MSMT LEADCHNL RA IMPEDANCE VALUE: 475 Ohm
MDC IDC MSMT LEADCHNL RA PACING THRESHOLD AMPLITUDE: 0.75 V
MDC IDC MSMT LEADCHNL RA SENSING INTR AMPL: 2.25 mV
MDC IDC MSMT LEADCHNL RA SENSING INTR AMPL: 2.25 mV
MDC IDC MSMT LEADCHNL RV PACING THRESHOLD AMPLITUDE: 1 V
MDC IDC MSMT LEADCHNL RV PACING THRESHOLD PULSEWIDTH: 0.4 ms
MDC IDC MSMT LEADCHNL RV SENSING INTR AMPL: 20.25 mV
MDC IDC PG IMPLANT DT: 20170320
MDC IDC SET LEADCHNL RV SENSING SENSITIVITY: 2.8 mV
MDC IDC STAT BRADY AP VS PERCENT: 52.62 %
MDC IDC STAT BRADY AS VS PERCENT: 47.34 %

## 2016-09-15 NOTE — Patient Instructions (Signed)
Medication Instructions:  Your physician recommends that you continue on your current medications as directed. Please refer to the Current Medication list given to you today.   Labwork: NONE  Testing/Procedures: Remote monitoring is used to monitor your Pacemaker of ICD from home. This monitoring reduces the number of office visits required to check your device to one time per year. It allows [place] to keep an eye on the functioning of your device to ensure it is working properly. You are scheduled for a device check from home on December 18, 2016. You may send your transmission at any time that day. If you have a wireless device, the transmission will be sent automatically. After your physician reviews your transmission, you will receive a postcard with your next transmission date.    Follow-Up: Your physician wants you to follow-up in: [place] Lovena Le.  You will receive a reminder letter in the mail two months in advance. If you don't receive a letter, please call our office to schedule the follow-up appointment.   Any Other Special Instructions Will Be Listed Below (If Applicable).     If you need a refill on your cardiac medications before your next appointment, please call your pharmacy.

## 2016-09-15 NOTE — Progress Notes (Signed)
HPI Jodi Peters returns today for PPM followup. She is a pleasant 83 yo woman with HTN and symptomatic bradycardia who underwent PPM insertion approx 3 months ago. In the interim, she has done well but her family has prevented her from being active. She sits a lot.   Current Outpatient Prescriptions  Medication Sig Dispense Refill  . acetaminophen (TYLENOL) 500 MG tablet Take 500 mg by mouth every 6 (six) hours as needed for mild pain or moderate pain.    Marland Kitchen acetaZOLAMIDE (DIAMOX) 250 MG tablet Take 250 mg by mouth 2 (two) times daily.     Marland Kitchen amLODipine (NORVASC) 10 MG tablet Take 1 tablet (10 mg total) by mouth daily. 30 tablet 0  . apixaban (ELIQUIS) 5 MG TABS tablet Take 1 tablet (5 mg total) by mouth 2 (two) times daily. 60 tablet 3  . aspirin EC 81 MG tablet Take 81 mg by mouth every morning.    . brimonidine (ALPHAGAN) 0.2 % ophthalmic solution Place 1 drop into both eyes 2 (two) times daily.    . Cholecalciferol (VITAMIN D3) 5000 units CAPS Take 1 capsule by mouth daily.    . clopidogrel (PLAVIX) 75 MG tablet Take 1 tablet (75 mg total) by mouth daily. 30 tablet 3  . dorzolamide-timolol (COSOPT) 22.3-6.8 MG/ML ophthalmic solution Place 1 drop into both eyes 2 (two) times daily.     . furosemide (LASIX) 20 MG tablet Take 20 mg by mouth every morning.    . latanoprost (XALATAN) 0.005 % ophthalmic solution Place 1 drop into both eyes at bedtime.    Marland Kitchen lisinopril (PRINIVIL,ZESTRIL) 40 MG tablet     . mirtazapine (REMERON) 15 MG tablet Take 15 mg by mouth at bedtime.    . pilocarpine (PILOCAR) 4 % ophthalmic solution Place 1 drop into both eyes 4 (four) times daily.     Jodi Peters Glycol-Propyl Glycol (SYSTANE OP) Apply 1 drop to eye as needed (for dry eye relief).     . Simethicone (GAS-X PO) Take 1 tablet by mouth daily as needed (for gas relief).     . simvastatin (ZOCOR) 20 MG tablet Take 20 mg by mouth daily at 6 PM.      No current facility-administered medications for this  visit.      Past Medical History:  Diagnosis Date  . Allergic rhinitis   . Anxiety   . Bradycardia   . Breast cancer ([place])   . Breast mass    left  . Breast pain    leftr  . Carotid atherosclerosis   . Carpal tunnel syndrome   . Cataract   . Constipation   . Cystocele 06/30/2014  . Glaucoma   . Hyperlipidemia   . Hypertension   . IBS (irritable bowel syndrome)   . Low back pain   . Mitral regurgitation    sinus bradycardia  . Osteoarthritis   . Osteopenia   . Rectocele 06/30/2014  . Seborrheic keratosis   . Venous insufficiency   . Vertigo   . Vitamin D deficiency     ROS:   All systems reviewed and negative except as noted in the HPI.   Past Surgical History:  Procedure Laterality Date  . ABDOMINAL HYSTERECTOMY    . APPENDECTOMY    . BACK SURGERY    . BREAST LUMPECTOMY    . CHOLECYSTECTOMY    . cyst removed from left breast    . EP IMPLANTABLE DEVICE N/A 08/30/2015   Procedure: Pacemaker  Implant;  Surgeon: Evans Lance, MD;  Location: [facility];  Service: Cardiovascular;  Laterality: N/A;  . EYE SURGERY     for bleed in left eye  . keloid     left ear  . LUMBAR LAMINECTOMY/DECOMPRESSION MICRODISCECTOMY N/A 12/25/2014   Procedure: Thoracic eleven-twelve Laminectomy/Diskectomy; Lumbar three-four Laminectomy/Diskectomy;  Surgeon: Kristeen Miss, MD;  Location: [facility];  Service: Neurosurgery;  Laterality: N/A;  . spurs on back       Family History  Problem Relation Age of Onset  . Heart disease Mother   . Hypertension Mother   . Diabetes Mother   . Other Daughter     left breast nodule  . Hypertension Son   . Hyperlipidemia Son   . Heart disease Maternal Grandmother   . Hypertension Maternal Grandmother   . Hypertension Son   . Other Son     boils     Social History   Social History  . Marital status: Divorced    Spouse name: N/A  . Number of children: N/A  . Years of education: N/A   Occupational History  . Not on file.    Social History Main Topics  . Smoking status: Never Smoker  . Smokeless tobacco: Never Used  . Alcohol use No  . Drug use: No  . Sexual activity: No     Comment: hyst   Other Topics Concern  . Not on file   Social History Narrative  . No narrative on file     BP (!) 147/74   Pulse 81   Ht 5\' 4"  (1.626 m)   Wt 123 lb (55.8 kg)   SpO2 98%   BMI 21.11 kg/m   Physical Exam:  Well appearing 83 yo woman, NAD HEENT: Unremarkable except for large keloid on left ear Neck:  6 cm JVD, no thyromegally Lymphatics:  No adenopathy Back:  No CVA tenderness Lungs:  Clear with no wheezes rales, or rhonchi. No keloid at her incision site. HEART:  Regular rate rhythm, no murmurs, no rubs, no clicks Abd:  soft, positive bowel sounds, no organomegally, no rebound, no guarding Ext:  2 plus pulses, no edema, no cyanosis, no clubbing Skin:  No rashes no nodules Neuro:  CN II through XII intact, motor grossly intact   Assess/Plan: 1. Symptomatic sinus node dysfunction - she is doing well, s/p PPM insertion 2. Dizziness due to #1, mostly resolved 3. HTN - her blood pressure is good. She will continue her current meds. 4. PM - her medtronic DDD PM is working normally. Will recheck in several months.  Jodi Peters.D.

## 2016-12-18 ENCOUNTER — Ambulatory Visit (INDEPENDENT_AMBULATORY_CARE_PROVIDER_SITE_OTHER): Payer: Medicare Other | Admitting: *Deleted

## 2016-12-18 ENCOUNTER — Telehealth: Payer: Self-pay | Admitting: Cardiology

## 2016-12-18 DIAGNOSIS — I495 Sick sinus syndrome: Secondary | ICD-10-CM

## 2016-12-18 NOTE — Telephone Encounter (Signed)
LMOVM reminding pt to send remote transmission.   

## 2016-12-19 LAB — CUP PACEART REMOTE DEVICE CHECK
Battery Remaining Longevity: 107 mo
Battery Voltage: 3.02 V
Brady Statistic AP VP Percent: 0.03 %
Brady Statistic AP VS Percent: 69.29 %
Brady Statistic AS VP Percent: 0.01 %
Brady Statistic AS VS Percent: 30.67 %
Brady Statistic RA Percent Paced: 69.3 %
Brady Statistic RV Percent Paced: 0.04 %
Date Time Interrogation Session: 20180709172140
Implantable Lead Implant Date: 20170320
Implantable Lead Implant Date: 20170320
Implantable Lead Location: 753859
Implantable Lead Location: 753860
Implantable Lead Model: 5076
Implantable Lead Model: 5076
Implantable Pulse Generator Implant Date: 20170320
Lead Channel Impedance Value: 342 Ohm
Lead Channel Impedance Value: 418 Ohm
Lead Channel Impedance Value: 475 Ohm
Lead Channel Impedance Value: 532 Ohm
Lead Channel Pacing Threshold Amplitude: 0.625 V
Lead Channel Pacing Threshold Amplitude: 1 V
Lead Channel Pacing Threshold Pulse Width: 0.4 ms
Lead Channel Pacing Threshold Pulse Width: 0.4 ms
Lead Channel Sensing Intrinsic Amplitude: 2.75 mV
Lead Channel Sensing Intrinsic Amplitude: 2.75 mV
Lead Channel Sensing Intrinsic Amplitude: 22.375 mV
Lead Channel Sensing Intrinsic Amplitude: 22.375 mV
Lead Channel Setting Pacing Amplitude: 1.5 V
Lead Channel Setting Pacing Amplitude: 2 V
Lead Channel Setting Pacing Pulse Width: 0.4 ms
Lead Channel Setting Sensing Sensitivity: 2.8 mV

## 2016-12-19 NOTE — Progress Notes (Signed)
Remote pacemaker transmission.   

## 2016-12-25 ENCOUNTER — Encounter: Payer: Self-pay | Admitting: Cardiology

## 2017-01-28 IMAGING — MR MR HEAD WO/W CM
6 of 12 series · 25 of 48 positions shown · IV contrast (7ml Multihance)
Comparison: Head CT 01/03/2015

CLINICAL DATA: Altered mental status and slurred speech, 5 days
duration. Abnormal head CT.

EXAM:
MRI HEAD WITHOUT AND WITH CONTRAST
TECHNIQUE: Multiplanar, multiecho pulse sequences of the brain and surrounding
structures were obtained without and with intravenous contrast.
CONTRAST:  7mL MULTIHANCE GADOBENATE DIMEGLUMINE 529 MG/ML IV SOLN

[Series 2: t1_fl2d_sag · sagittal · 5.0mm · 0.45mm/px · 2 of 20 slices shown]
[im 1/20]
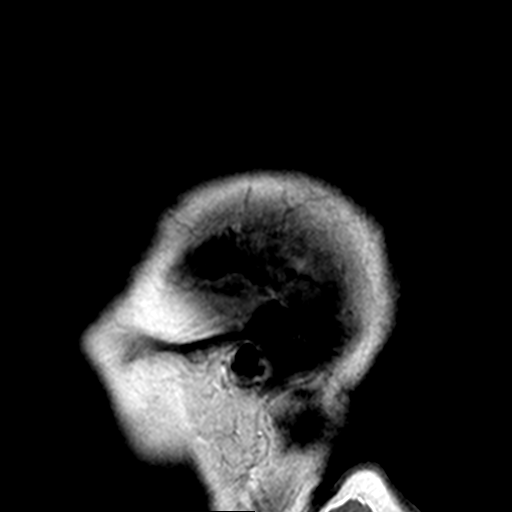
[im 20/20]
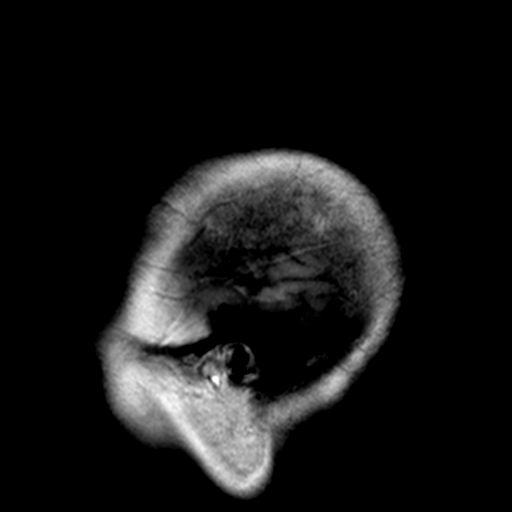

[Series 5: T2 · axial · 5.0mm · 0.75mm/px · z∈[-68,+73]mm · 2 of 23 slices shown]
[im 1/23]
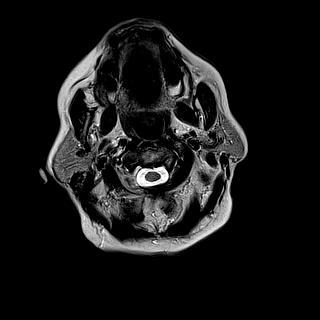
[im 23/23]
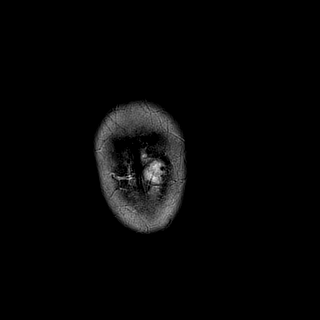

[Series 6: FLAIR · axial · 5.0mm · 0.94mm/px · z∈[-68,+73]mm · 2 of 23 slices shown]
[im 1/23]
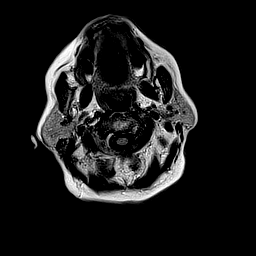
[im 23/23]
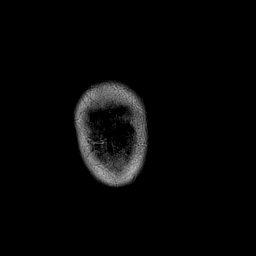

[Series 7: T1 · axial · 2.0mm · 0.45mm/px · z∈[-85,+101]mm · 8 of 95 slices shown]
[im 1/95]
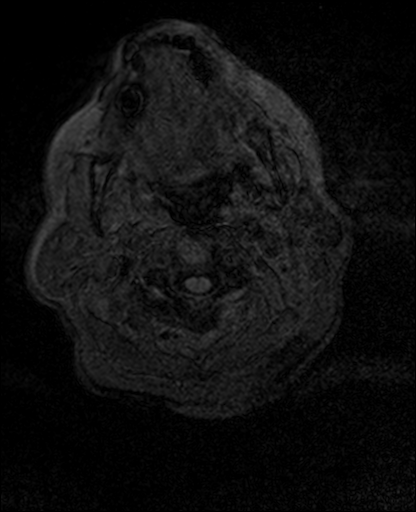
[im 12/95]
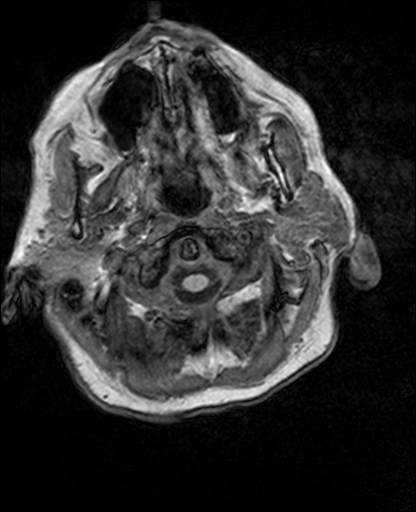
[im 24/95]
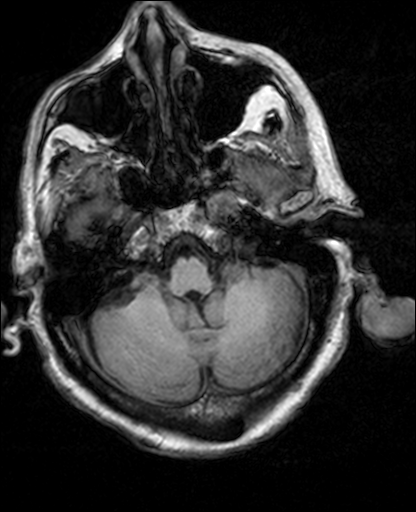
[im 36/95]
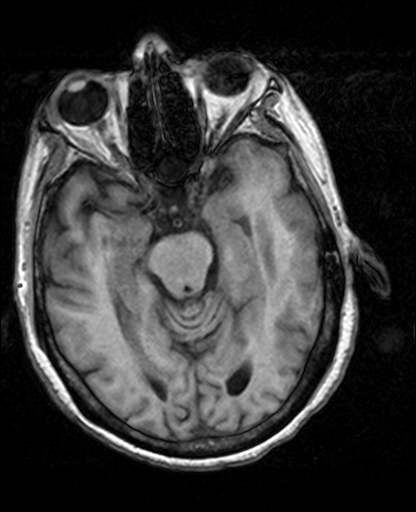
[im 59/95]
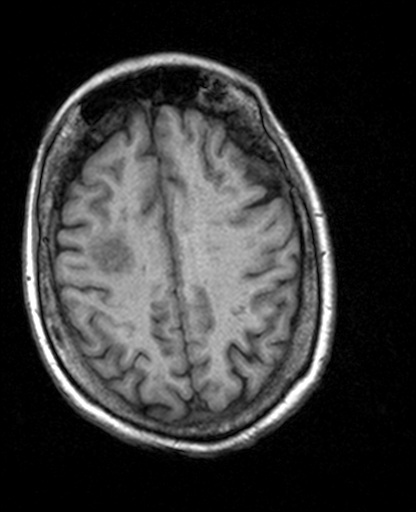
[im 71/95]
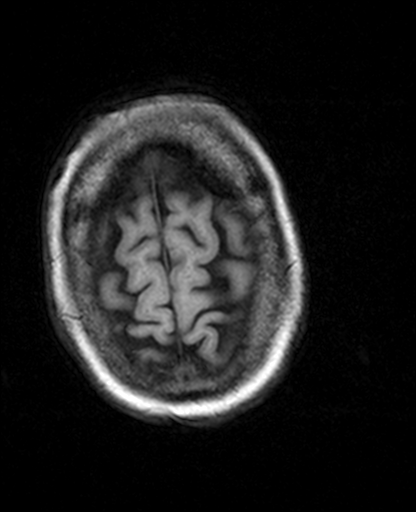
[im 83/95]
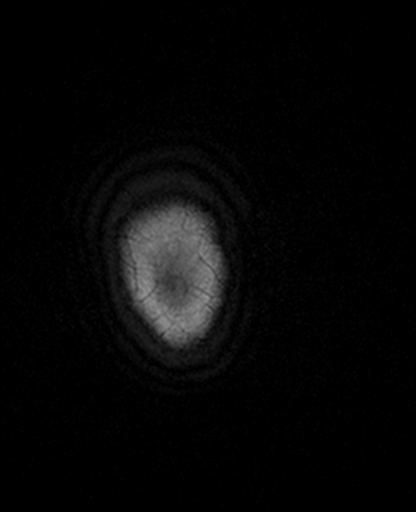
[im 95/95]
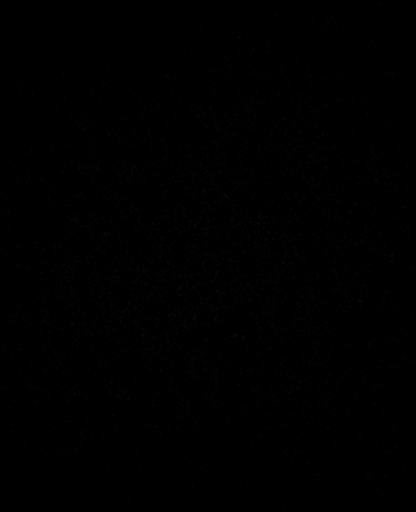

[Series 10: T1 post-contrast · coronal · 5.0mm · 0.41mm/px · 2 of 24 slices shown (1 of 2)]
[im 1/24]
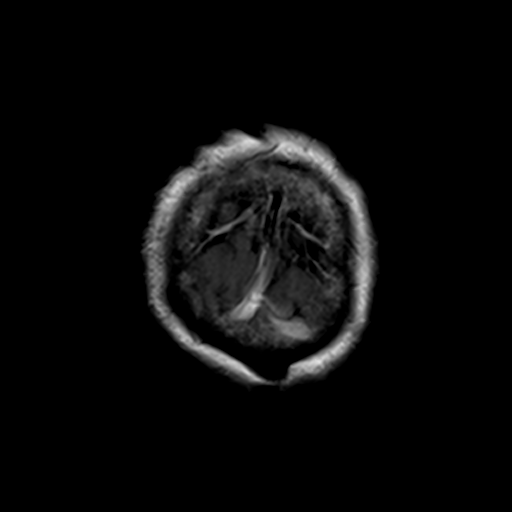
[im 24/24]
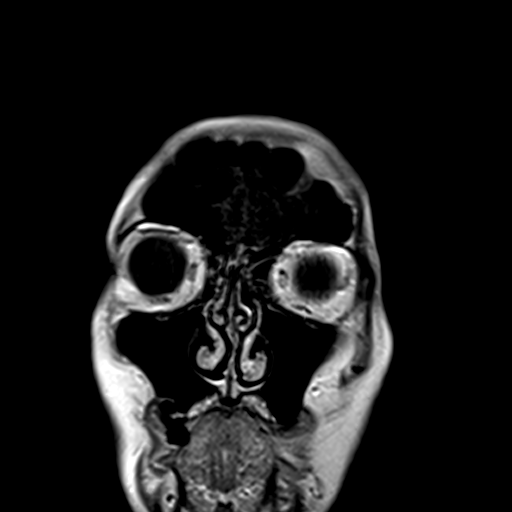

[Series 11: T1 post-contrast · axial · 2.0mm · 0.45mm/px · z∈[-85,+101]mm · 9 of 95 slices shown (2 of 2)]
[im 1/95]
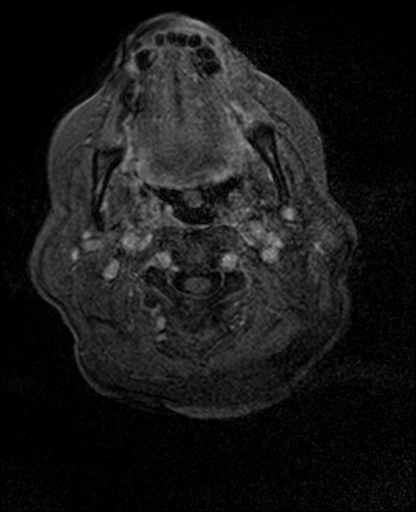
[im 12/95]
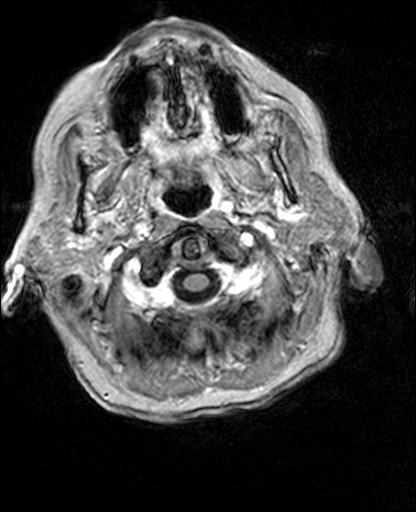
[im 24/95]
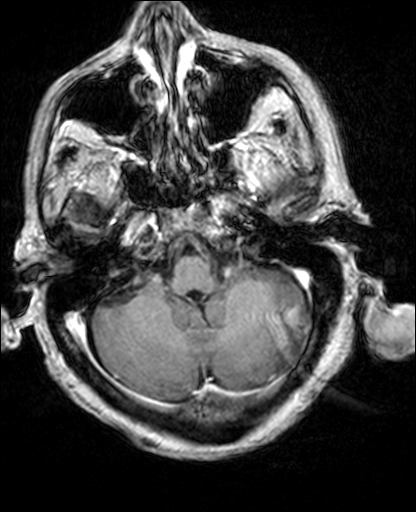
[im 36/95]
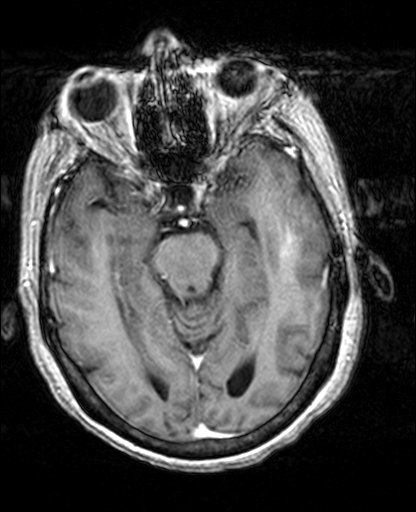
[im 48/95]
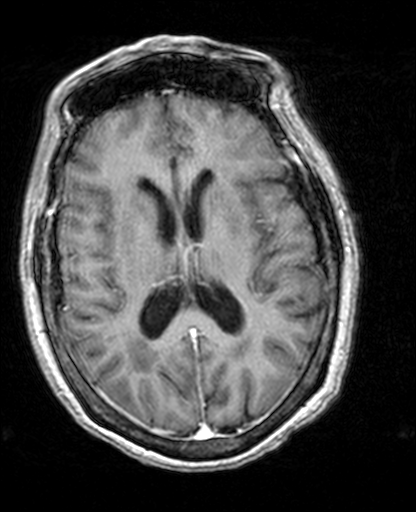
[im 59/95]
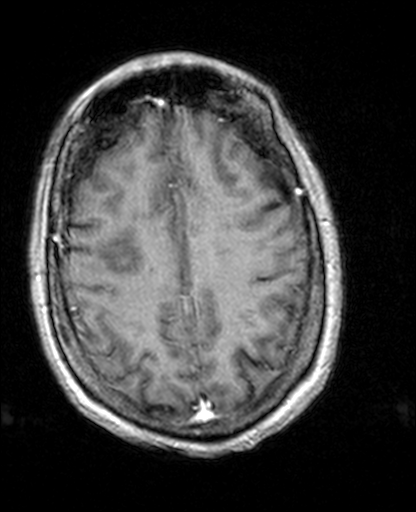
[im 71/95]
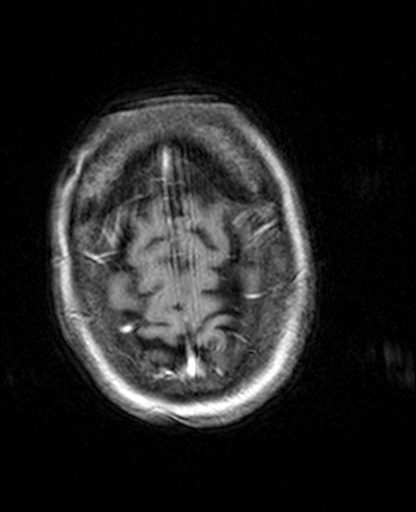
[im 83/95]
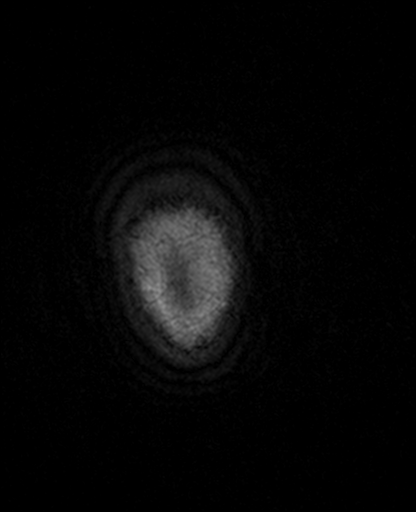
[im 95/95]
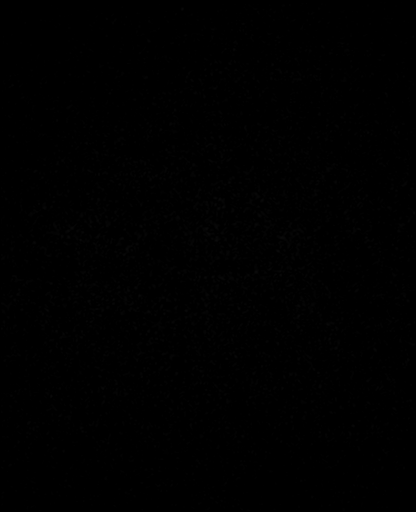

[25 of 48 positions shown; findings below may reference images not displayed]

FINDINGS: There is a 2.7 cm region of restricted diffusion in the right
frontal subcortical white matter with a few areas of involvement of
the overlying cortex, most consistent with acute/ subacute ischemic
infarction. No separate areas of involvement are seen. There is no
evidence of hemorrhage. No abnormal contrast enhancement occurs.
Elsewhere in the brain, there are old small vessel infarctions
within the white matter. No hydrocephalus or extra-axial collection.
Venous sinuses are patent. No pituitary mass. No inflammatory sinus
disease. No skull or skullbase lesion.
IMPRESSION: 2.7 cm acute/subacute infarction in the right posterior frontal
white matter with minor overlying cortical involvement. No
hemorrhage. No acute strokes elsewhere within the brain.

Chronic small vessel ischemic changes elsewhere affecting the
cerebral hemispheric white matter.

## 2017-02-09 ENCOUNTER — Telehealth: Payer: Self-pay | Admitting: Internal Medicine

## 2017-02-09 NOTE — Telephone Encounter (Signed)
Patient called because her pacemaker carelink connector alarmed.  States that she tried to transmit, but got an alarm to call medtronic.  States there is no text on the alarm and that it just says to call.  Patient states that she feels well, no syncope, pulse is 74.  States this has never happened before.  Pt has hx of symptomatic bradycardia, pacemaker was implanted earlier this year by Dr. Cristopher Peru.  Last interrogation 12/19/16 showed pacing in atrium 70% of time (negligible V pacing) with a lower rate of 60.  Battery life at that time was approx 9 yrs.  Called [employer], spoke with representative Thayer Headings. She stated that the alarm appears to be due to a connectivity issue with the trasmission.  She advised the patient unplug the monitor and plug back in then try to transmit again on Monday.   Called Thompson Grayer (EP on call) who agreed with the plan suggested by [employer], will try and arrange follow up with device clinic.  Called back patient and advised her of this and she was appreciative of the help.  Magda Paganini, MD Cardiology fellow

## 2017-02-19 NOTE — Telephone Encounter (Signed)
Called patient and walked her through a manual transmission. Transmission was successful. Home monitor appears to be working approprietly at this time. Patient informed me that she heard a whistling noise come from her home monitor. I informed patient that her monitor is not capable of making noises. The only noises the monitor will make are the noises it make during a manual transmission.  Patient stated that she feels fine and verbalized understanding.

## 2017-03-19 ENCOUNTER — Telehealth: Payer: Self-pay | Admitting: Cardiology

## 2017-03-19 ENCOUNTER — Ambulatory Visit (INDEPENDENT_AMBULATORY_CARE_PROVIDER_SITE_OTHER): Payer: Medicare Other | Admitting: *Deleted

## 2017-03-19 DIAGNOSIS — I495 Sick sinus syndrome: Secondary | ICD-10-CM

## 2017-03-19 NOTE — Telephone Encounter (Signed)
Spoke with pt and reminded pt of remote transmission that is due today. Pt verbalized understanding.   

## 2017-03-20 NOTE — Progress Notes (Signed)
Remote pacemaker transmission.   

## 2017-03-21 LAB — CUP PACEART REMOTE DEVICE CHECK
Battery Remaining Longevity: 106 mo
Battery Voltage: 3.02 V
Brady Statistic AP VS Percent: 46.54 %
Brady Statistic AS VS Percent: 53.42 %
Brady Statistic RV Percent Paced: 0.04 %
Date Time Interrogation Session: 20181008173328
Implantable Lead Implant Date: 20170320
Implantable Pulse Generator Implant Date: 20170320
Lead Channel Impedance Value: 494 Ohm
Lead Channel Impedance Value: 532 Ohm
Lead Channel Pacing Threshold Amplitude: 0.625 V
Lead Channel Pacing Threshold Amplitude: 0.75 V
Lead Channel Pacing Threshold Pulse Width: 0.4 ms
Lead Channel Sensing Intrinsic Amplitude: 18.875 mV
Lead Channel Sensing Intrinsic Amplitude: 2.625 mV
Lead Channel Sensing Intrinsic Amplitude: 2.625 mV
Lead Channel Setting Sensing Sensitivity: 2.8 mV
MDC IDC LEAD IMPLANT DT: 20170320
MDC IDC LEAD LOCATION: 753859
MDC IDC LEAD LOCATION: 753860
MDC IDC MSMT LEADCHNL RA IMPEDANCE VALUE: 361 Ohm
MDC IDC MSMT LEADCHNL RA PACING THRESHOLD PULSEWIDTH: 0.4 ms
MDC IDC MSMT LEADCHNL RV IMPEDANCE VALUE: 437 Ohm
MDC IDC MSMT LEADCHNL RV SENSING INTR AMPL: 18.875 mV
MDC IDC SET LEADCHNL RA PACING AMPLITUDE: 1.5 V
MDC IDC SET LEADCHNL RV PACING AMPLITUDE: 2 V
MDC IDC SET LEADCHNL RV PACING PULSEWIDTH: 0.4 ms
MDC IDC STAT BRADY AP VP PERCENT: 0.02 %
MDC IDC STAT BRADY AS VP PERCENT: 0.02 %
MDC IDC STAT BRADY RA PERCENT PACED: 46.53 %

## 2017-03-23 ENCOUNTER — Encounter: Payer: Self-pay | Admitting: Cardiology

## 2017-06-18 ENCOUNTER — Telehealth: Payer: Self-pay | Admitting: Cardiology

## 2017-06-18 ENCOUNTER — Ambulatory Visit (INDEPENDENT_AMBULATORY_CARE_PROVIDER_SITE_OTHER): Payer: Medicare Other | Admitting: *Deleted

## 2017-06-18 DIAGNOSIS — I495 Sick sinus syndrome: Secondary | ICD-10-CM | POA: Diagnosis not present

## 2017-06-18 NOTE — Telephone Encounter (Signed)
Spoke with pt and reminded pt of remote transmission that is due today. Pt verbalized understanding.   

## 2017-06-19 ENCOUNTER — Encounter: Payer: Self-pay | Admitting: Cardiology

## 2017-06-19 LAB — CUP PACEART REMOTE DEVICE CHECK
Battery Remaining Longevity: 103 mo
Brady Statistic AP VS Percent: 52.18 %
Brady Statistic AS VP Percent: 0.02 %
Brady Statistic RA Percent Paced: 52.17 %
Brady Statistic RV Percent Paced: 0.04 %
Date Time Interrogation Session: 20190107223640
Implantable Lead Location: 753859
Implantable Lead Location: 753860
Implantable Lead Model: 5076
Lead Channel Impedance Value: 532 Ohm
Lead Channel Pacing Threshold Pulse Width: 0.4 ms
Lead Channel Sensing Intrinsic Amplitude: 2.625 mV
Lead Channel Sensing Intrinsic Amplitude: 20.125 mV
Lead Channel Sensing Intrinsic Amplitude: 20.125 mV
Lead Channel Setting Pacing Amplitude: 2 V
Lead Channel Setting Pacing Pulse Width: 0.4 ms
Lead Channel Setting Sensing Sensitivity: 2.8 mV
MDC IDC LEAD IMPLANT DT: 20170320
MDC IDC LEAD IMPLANT DT: 20170320
MDC IDC MSMT BATTERY VOLTAGE: 3.02 V
MDC IDC MSMT LEADCHNL RA IMPEDANCE VALUE: 361 Ohm
MDC IDC MSMT LEADCHNL RA IMPEDANCE VALUE: 494 Ohm
MDC IDC MSMT LEADCHNL RA PACING THRESHOLD AMPLITUDE: 0.625 V
MDC IDC MSMT LEADCHNL RA SENSING INTR AMPL: 2.625 mV
MDC IDC MSMT LEADCHNL RV IMPEDANCE VALUE: 437 Ohm
MDC IDC MSMT LEADCHNL RV PACING THRESHOLD AMPLITUDE: 0.75 V
MDC IDC MSMT LEADCHNL RV PACING THRESHOLD PULSEWIDTH: 0.4 ms
MDC IDC PG IMPLANT DT: 20170320
MDC IDC SET LEADCHNL RA PACING AMPLITUDE: 1.5 V
MDC IDC STAT BRADY AP VP PERCENT: 0.02 %
MDC IDC STAT BRADY AS VS PERCENT: 47.77 %

## 2017-06-19 NOTE — Progress Notes (Signed)
Remote pacemaker transmission.   

## 2017-09-17 ENCOUNTER — Telehealth: Payer: Self-pay | Admitting: Cardiology

## 2017-09-17 ENCOUNTER — Ambulatory Visit (INDEPENDENT_AMBULATORY_CARE_PROVIDER_SITE_OTHER): Payer: Medicare Other | Admitting: *Deleted

## 2017-09-17 DIAGNOSIS — I495 Sick sinus syndrome: Secondary | ICD-10-CM

## 2017-09-17 NOTE — Telephone Encounter (Signed)
Spoke with pt and reminded pt of remote transmission that is due today. Pt verbalized understanding.   

## 2017-09-18 NOTE — Progress Notes (Signed)
Remote pacemaker transmission.   

## 2017-09-19 ENCOUNTER — Ambulatory Visit: Payer: Medicare Other | Admitting: Internal Medicine

## 2017-09-19 ENCOUNTER — Encounter: Payer: Self-pay | Admitting: Internal Medicine

## 2017-09-19 VITALS — BP 136/76 | HR 69 | Ht 63.0 in | Wt 132.2 lb

## 2017-09-19 DIAGNOSIS — I495 Sick sinus syndrome: Secondary | ICD-10-CM

## 2017-09-19 NOTE — Progress Notes (Signed)
HPI Mrs. Papadopoulos returns today for PPM followup. She is a pleasant 84 yo woman with HTN and symptomatic bradycardia who underwent PPM insertion  over a year ago. In the interim, she has done well but has remained sedentary.  She sits a lot.  No syncope.  No chest pain.  She has very mild dyspnea with exertion.  She is being treated for multiple eye problems.  Allergies  Allergen Reactions  . Penicillins Rash    Has patient had a PCN reaction causing immediate rash, facial/tongue/throat swelling, SOB or lightheadedness with hypotension: Yes Has patient had a PCN reaction causing severe rash involving mucus membranes or skin necrosis: No Has patient had a PCN reaction that required hospitalization No Has patient had a PCN reaction occurring within the last 10 years: Yes If all of the above answers are "NO", then may proceed with Cephalosporin use.     Current Outpatient Medications  Medication Sig Dispense Refill  . acetaminophen (TYLENOL) 500 MG tablet Take 500 mg by mouth every 6 (six) hours as needed for mild pain or moderate pain.    Marland Kitchen acetaZOLAMIDE (DIAMOX) 250 MG tablet Take 250 mg by mouth 2 (two) times daily.     Marland Kitchen amLODipine (NORVASC) 10 MG tablet Take 1 tablet (10 mg total) by mouth daily. 30 tablet 0  . apixaban (ELIQUIS) 5 MG TABS tablet Take 1 tablet (5 mg total) by mouth 2 (two) times daily. 60 tablet 3  . aspirin EC 81 MG tablet Take 81 mg by mouth every morning.    . brimonidine (ALPHAGAN) 0.2 % ophthalmic solution Place 1 drop into both eyes 2 (two) times daily.    . Cholecalciferol (VITAMIN D3) 5000 units CAPS Take 1 capsule by mouth daily.    . clopidogrel (PLAVIX) 75 MG tablet Take 1 tablet (75 mg total) by mouth daily. 30 tablet 3  . dorzolamide-timolol (COSOPT) 22.3-6.8 MG/ML ophthalmic solution Place 1 drop into both eyes 2 (two) times daily.     . furosemide (LASIX) 20 MG tablet Take 20 mg by mouth every morning.    . latanoprost (XALATAN) 0.005 % ophthalmic  solution Place 1 drop into both eyes at bedtime.    Marland Kitchen lisinopril (PRINIVIL,ZESTRIL) 40 MG tablet     . mirtazapine (REMERON) 15 MG tablet Take 15 mg by mouth at bedtime.    . pilocarpine (PILOCAR) 4 % ophthalmic solution Place 1 drop into both eyes 4 (four) times daily.     [name] Glycol-Propyl Glycol (SYSTANE OP) Apply 1 drop to eye as needed (for dry eye relief).     . Simethicone (GAS-X PO) Take 1 tablet by mouth daily as needed (for gas relief).     . simvastatin (ZOCOR) 20 MG tablet Take 20 mg by mouth daily at 6 PM.      No current facility-administered medications for this visit.      Past Medical History:  Diagnosis Date  . Allergic rhinitis   . Anxiety   . Bradycardia   . Breast cancer ([place])   . Breast mass    left  . Breast pain    leftr  . Carotid atherosclerosis   . Carpal tunnel syndrome   . Cataract   . Constipation   . Cystocele 06/30/2014  . Glaucoma   . Hyperlipidemia   . Hypertension   . IBS (irritable bowel syndrome)   . Low back pain   . Mitral regurgitation    sinus bradycardia  .  Osteoarthritis   . Osteopenia   . Rectocele 06/30/2014  . Seborrheic keratosis   . Venous insufficiency   . Vertigo   . Vitamin D deficiency     ROS:   All systems reviewed and negative except as noted in the HPI.   Past Surgical History:  Procedure Laterality Date  . ABDOMINAL HYSTERECTOMY    . APPENDECTOMY    . BACK SURGERY    . BREAST LUMPECTOMY    . CHOLECYSTECTOMY    . cyst removed from left breast    . EP IMPLANTABLE DEVICE N/A 08/30/2015   Procedure: Pacemaker Implant;  Surgeon: Evans Lance, MD;  Location: [facility];  Service: Cardiovascular;  Laterality: N/A;  . EYE SURGERY     for bleed in left eye  . keloid     left ear  . LUMBAR LAMINECTOMY/DECOMPRESSION MICRODISCECTOMY N/A 12/25/2014   Procedure: Thoracic eleven-twelve Laminectomy/Diskectomy; Lumbar three-four Laminectomy/Diskectomy;  Surgeon: Kristeen Miss, MD;  Location: [facility];  Service: Neurosurgery;  Laterality: N/A;  . spurs on back       Family History  Problem Relation Age of Onset  . Heart disease Mother   . Hypertension Mother   . Diabetes Mother   . Other Daughter        left breast nodule  . Hypertension Son   . Hyperlipidemia Son   . Heart disease Maternal Grandmother   . Hypertension Maternal Grandmother   . Hypertension Son   . Other Son        boils     Social History   Socioeconomic History  . Marital status: Divorced    Spouse name: Not on file  . Number of children: Not on file  . Years of education: Not on file  . Highest education level: Not on file  Occupational History  . Not on file  Social Needs  . Financial resource strain: Not on file  . Food insecurity:    Worry: Not on file    Inability: Not on file  . Transportation needs:    Medical: Not on file    Non-medical: Not on file  Tobacco Use  . Smoking status: Never Smoker  . Smokeless tobacco: Never Used  Substance and Sexual Activity  . Alcohol use: No    Alcohol/week: 0.0 oz  . Drug use: No  . Sexual activity: Never    Birth control/protection: Surgical    Comment: hyst  Lifestyle  . Physical activity:    Days per week: Not on file    Minutes per session: Not on file  . Stress: Not on file  Relationships  . Social connections:    Talks on phone: Not on file    Gets together: Not on file    Attends religious service: Not on file    Active member of club or organization: Not on file    Attends meetings of clubs or organizations: Not on file    Relationship status: Not on file  . Intimate partner violence:    Fear of current or ex partner: Not on file    Emotionally abused: Not on file    Physically abused: Not on file    Forced sexual activity: Not on file  Other Topics Concern  . Not on file  Social History Narrative  . Not on file     BP 136/76 (BP Location: Right Arm)   Pulse 69   Ht 5\' 3"  (1.6 m)   Wt 132  lb 3.2 oz (60 kg)   SpO2  96%   BMI 23.42 kg/m   Physical Exam:  Well appearing 84 year old woman, NAD HEENT: Unremarkable except for a large keloid on her left ear Neck: 6 cm JVD, no thyromegally Lymphatics:  No adenopathy Back:  No CVA tenderness Lungs:  Clear, with no wheezes, rales, or rhonchi HEART:  Regular rate rhythm, no murmurs, no rubs, no clicks Abd:  soft, positive bowel sounds, no organomegally, no rebound, no guarding Ext:  2 plus pulses, no edema, no cyanosis, no clubbing Skin:  No rashes no nodules Neuro:  CN II through XII intact, motor grossly intact  EKG -normal sinus rhythm with normal axis and intervals  DEVICE  Normal device function.  See PaceArt for details.   Assess/Plan: 1.  Sinus node dysfunction -she is asymptomatic status post pacemaker insertion 2.  Pacemaker -her Medtronic dual-chamber pacemaker is working normally.  We will plan to recheck in several months. 3.  Hypertension -her systolic blood pressure is slightly elevated today.  Mostly it is well controlled.  She is encouraged to reduce her salt intake 4.  Dizziness -this is resolved and I have encouraged the patient to increase her physical activity as she is able.  Cristopher Peru, MD

## 2017-09-19 NOTE — Patient Instructions (Signed)
Medication Instructions:  Your physician recommends that you continue on your current medications as directed. Please refer to the Current Medication list given to you today.   Labwork: NONE   Testing/Procedures: NONE   Follow-Up: Your physician wants you to follow-up in: 1 Year with Dr. Taylor. You will receive a reminder letter in the mail two months in advance. If you don't receive a letter, please call our office to schedule the follow-up appointment.  Remote monitoring is used to monitor your Pacemaker of ICD from home. This monitoring reduces the number of office visits required to check your device to one time per year. It allows us to keep an eye on the functioning of your device to ensure it is working properly. You are scheduled for a device check from home on 12/17/17. You may send your transmission at any time that day. If you have a wireless device, the transmission will be sent automatically. After your physician reviews your transmission, you will receive a postcard with your next transmission date.    Any Other Special Instructions Will Be Listed Below (If Applicable).     If you need a refill on your cardiac medications before your next appointment, please call your pharmacy.  Thank you for choosing [facility]!    

## 2017-09-20 ENCOUNTER — Encounter: Payer: Self-pay | Admitting: Cardiology

## 2017-09-27 LAB — CUP PACEART INCLINIC DEVICE CHECK
Date Time Interrogation Session: 20190418125846
Implantable Lead Implant Date: 20170320
Implantable Lead Implant Date: 20170320
Implantable Lead Location: 753859
Implantable Lead Location: 753860
Implantable Lead Model: 5076
Implantable Lead Model: 5076
Implantable Pulse Generator Implant Date: 20170320

## 2017-10-02 LAB — CUP PACEART REMOTE DEVICE CHECK
Battery Remaining Longevity: 101 mo
Brady Statistic AP VP Percent: 0.02 %
Brady Statistic AS VP Percent: 0.02 %
Brady Statistic AS VS Percent: 59.01 %
Brady Statistic RA Percent Paced: 40.96 %
Date Time Interrogation Session: 20190408173254
Implantable Lead Implant Date: 20170320
Implantable Lead Location: 753860
Implantable Lead Model: 5076
Implantable Pulse Generator Implant Date: 20170320
Lead Channel Pacing Threshold Pulse Width: 0.4 ms
Lead Channel Sensing Intrinsic Amplitude: 2.625 mV
Lead Channel Sensing Intrinsic Amplitude: 2.625 mV
Lead Channel Sensing Intrinsic Amplitude: 20.25 mV
Lead Channel Sensing Intrinsic Amplitude: 20.25 mV
MDC IDC LEAD IMPLANT DT: 20170320
MDC IDC LEAD LOCATION: 753859
MDC IDC MSMT BATTERY VOLTAGE: 3.02 V
MDC IDC MSMT LEADCHNL RA IMPEDANCE VALUE: 323 Ohm
MDC IDC MSMT LEADCHNL RA IMPEDANCE VALUE: 456 Ohm
MDC IDC MSMT LEADCHNL RA PACING THRESHOLD AMPLITUDE: 0.625 V
MDC IDC MSMT LEADCHNL RV IMPEDANCE VALUE: 437 Ohm
MDC IDC MSMT LEADCHNL RV IMPEDANCE VALUE: 551 Ohm
MDC IDC MSMT LEADCHNL RV PACING THRESHOLD AMPLITUDE: 0.875 V
MDC IDC MSMT LEADCHNL RV PACING THRESHOLD PULSEWIDTH: 0.4 ms
MDC IDC SET LEADCHNL RA PACING AMPLITUDE: 1.5 V
MDC IDC SET LEADCHNL RV PACING AMPLITUDE: 2 V
MDC IDC SET LEADCHNL RV PACING PULSEWIDTH: 0.4 ms
MDC IDC SET LEADCHNL RV SENSING SENSITIVITY: 2.8 mV
MDC IDC STAT BRADY AP VS PERCENT: 40.95 %
MDC IDC STAT BRADY RV PERCENT PACED: 0.04 %

## 2017-12-17 ENCOUNTER — Ambulatory Visit (INDEPENDENT_AMBULATORY_CARE_PROVIDER_SITE_OTHER): Payer: Medicare Other | Admitting: *Deleted

## 2017-12-17 DIAGNOSIS — I495 Sick sinus syndrome: Secondary | ICD-10-CM

## 2017-12-18 NOTE — Progress Notes (Signed)
Remote pacemaker transmission.   

## 2017-12-21 LAB — CUP PACEART REMOTE DEVICE CHECK
Battery Remaining Longevity: 99 mo
Battery Voltage: 3.02 V
Brady Statistic AS VS Percent: 54.08 %
Brady Statistic RV Percent Paced: 0.04 %
Date Time Interrogation Session: 20190706114344
Implantable Lead Implant Date: 20170320
Implantable Lead Location: 753860
Implantable Pulse Generator Implant Date: 20170320
Lead Channel Impedance Value: 456 Ohm
Lead Channel Impedance Value: 456 Ohm
Lead Channel Impedance Value: 532 Ohm
Lead Channel Pacing Threshold Amplitude: 0.625 V
Lead Channel Pacing Threshold Amplitude: 1 V
Lead Channel Sensing Intrinsic Amplitude: 2.25 mV
Lead Channel Setting Sensing Sensitivity: 2.8 mV
MDC IDC LEAD IMPLANT DT: 20170320
MDC IDC LEAD LOCATION: 753859
MDC IDC MSMT LEADCHNL RA IMPEDANCE VALUE: 323 Ohm
MDC IDC MSMT LEADCHNL RA PACING THRESHOLD PULSEWIDTH: 0.4 ms
MDC IDC MSMT LEADCHNL RA SENSING INTR AMPL: 2.25 mV
MDC IDC MSMT LEADCHNL RV PACING THRESHOLD PULSEWIDTH: 0.4 ms
MDC IDC MSMT LEADCHNL RV SENSING INTR AMPL: 20.375 mV
MDC IDC MSMT LEADCHNL RV SENSING INTR AMPL: 20.375 mV
MDC IDC SET LEADCHNL RA PACING AMPLITUDE: 1.5 V
MDC IDC SET LEADCHNL RV PACING AMPLITUDE: 2 V
MDC IDC SET LEADCHNL RV PACING PULSEWIDTH: 0.4 ms
MDC IDC STAT BRADY AP VP PERCENT: 0.02 %
MDC IDC STAT BRADY AP VS PERCENT: 45.88 %
MDC IDC STAT BRADY AS VP PERCENT: 0.02 %
MDC IDC STAT BRADY RA PERCENT PACED: 45.89 %

## 2018-03-19 ENCOUNTER — Telehealth: Payer: Self-pay | Admitting: Cardiology

## 2018-03-19 ENCOUNTER — Encounter: Payer: Medicare Other | Admitting: *Deleted

## 2018-03-19 NOTE — Telephone Encounter (Signed)
Spoke with pt and reminded pt of remote transmission that is due today. Pt verbalized understanding.   

## 2018-03-27 ENCOUNTER — Ambulatory Visit (INDEPENDENT_AMBULATORY_CARE_PROVIDER_SITE_OTHER): Payer: Medicare Other | Admitting: *Deleted

## 2018-03-27 DIAGNOSIS — I495 Sick sinus syndrome: Secondary | ICD-10-CM | POA: Diagnosis not present

## 2018-03-28 NOTE — Progress Notes (Signed)
Remote pacemaker transmission.   

## 2018-03-29 ENCOUNTER — Encounter: Payer: Self-pay | Admitting: Cardiology

## 2018-04-20 LAB — CUP PACEART REMOTE DEVICE CHECK
Battery Remaining Longevity: 97 mo
Battery Voltage: 3.02 V
Brady Statistic AP VP Percent: 0.02 %
Brady Statistic AP VS Percent: 47.88 %
Brady Statistic AS VP Percent: 0.02 %
Brady Statistic AS VS Percent: 52.07 %
Brady Statistic RA Percent Paced: 47.9 %
Brady Statistic RV Percent Paced: 0.05 %
Date Time Interrogation Session: 20191016232452
Implantable Lead Implant Date: 20170320
Implantable Lead Implant Date: 20170320
Implantable Lead Location: 753859
Implantable Lead Location: 753860
Implantable Lead Model: 5076
Implantable Lead Model: 5076
Implantable Pulse Generator Implant Date: 20170320
Lead Channel Impedance Value: 323 Ohm
Lead Channel Impedance Value: 437 Ohm
Lead Channel Impedance Value: 437 Ohm
Lead Channel Impedance Value: 532 Ohm
Lead Channel Pacing Threshold Amplitude: 0.625 V
Lead Channel Pacing Threshold Amplitude: 0.875 V
Lead Channel Pacing Threshold Pulse Width: 0.4 ms
Lead Channel Pacing Threshold Pulse Width: 0.4 ms
Lead Channel Sensing Intrinsic Amplitude: 19.75 mV
Lead Channel Sensing Intrinsic Amplitude: 19.75 mV
Lead Channel Sensing Intrinsic Amplitude: 2.875 mV
Lead Channel Sensing Intrinsic Amplitude: 2.875 mV
Lead Channel Setting Pacing Amplitude: 1.5 V
Lead Channel Setting Pacing Amplitude: 2 V
Lead Channel Setting Pacing Pulse Width: 0.4 ms
Lead Channel Setting Sensing Sensitivity: 2.8 mV

## 2018-05-15 ENCOUNTER — Other Ambulatory Visit: Payer: Self-pay

## 2018-05-15 NOTE — Patient Outreach (Signed)
[facility]) Care Management  05/15/2018  Jodi Peters [birth year] [id number]   Medication Adherence call to Jodi Peters spoke with patient she is due on Simvastatin 20 mg she explain she is only taking 1/2 tablet because she was having side effects she talk to her doctor , doctor told her to take 1/2 tablet ,patient was a bit upset because this is her 3rd call from [facility] and has explained it to everyone . Jodi Peters is showing past due under [place].   [place] Management Direct Dial [phone]  Fax [phone] [email or web address]

## 2018-06-28 ENCOUNTER — Encounter: Payer: Self-pay | Admitting: Cardiology

## 2018-07-04 ENCOUNTER — Telehealth: Payer: Self-pay

## 2018-07-04 ENCOUNTER — Ambulatory Visit (INDEPENDENT_AMBULATORY_CARE_PROVIDER_SITE_OTHER): Payer: Medicare Other

## 2018-07-04 DIAGNOSIS — I495 Sick sinus syndrome: Secondary | ICD-10-CM | POA: Diagnosis not present

## 2018-07-04 NOTE — Telephone Encounter (Signed)
No answer

## 2018-07-05 ENCOUNTER — Encounter: Payer: Self-pay | Admitting: Cardiology

## 2018-07-05 NOTE — Progress Notes (Signed)
Remote pacemaker transmission.   

## 2018-07-07 LAB — CUP PACEART REMOTE DEVICE CHECK
Battery Remaining Longevity: 96 mo
Brady Statistic RA Percent Paced: 30.87 %
Date Time Interrogation Session: 20200123182555
Implantable Lead Location: 753860
Implantable Lead Model: 5076
Implantable Lead Model: 5076
Implantable Pulse Generator Implant Date: 20170320
Lead Channel Pacing Threshold Pulse Width: 0.4 ms
Lead Channel Pacing Threshold Pulse Width: 0.4 ms
Lead Channel Sensing Intrinsic Amplitude: 2.375 mV
Lead Channel Sensing Intrinsic Amplitude: 2.375 mV
Lead Channel Sensing Intrinsic Amplitude: 20.875 mV
Lead Channel Setting Pacing Amplitude: 2 V
Lead Channel Setting Pacing Pulse Width: 0.4 ms
MDC IDC LEAD IMPLANT DT: 20170320
MDC IDC LEAD IMPLANT DT: 20170320
MDC IDC LEAD LOCATION: 753859
MDC IDC MSMT BATTERY VOLTAGE: 3.02 V
MDC IDC MSMT LEADCHNL RA IMPEDANCE VALUE: 323 Ohm
MDC IDC MSMT LEADCHNL RA IMPEDANCE VALUE: 437 Ohm
MDC IDC MSMT LEADCHNL RA PACING THRESHOLD AMPLITUDE: 0.625 V
MDC IDC MSMT LEADCHNL RV IMPEDANCE VALUE: 456 Ohm
MDC IDC MSMT LEADCHNL RV IMPEDANCE VALUE: 551 Ohm
MDC IDC MSMT LEADCHNL RV PACING THRESHOLD AMPLITUDE: 0.75 V
MDC IDC MSMT LEADCHNL RV SENSING INTR AMPL: 20.875 mV
MDC IDC SET LEADCHNL RA PACING AMPLITUDE: 1.5 V
MDC IDC SET LEADCHNL RV SENSING SENSITIVITY: 2.8 mV
MDC IDC STAT BRADY AP VP PERCENT: 0.02 %
MDC IDC STAT BRADY AP VS PERCENT: 30.86 %
MDC IDC STAT BRADY AS VP PERCENT: 0.02 %
MDC IDC STAT BRADY AS VS PERCENT: 69.1 %
MDC IDC STAT BRADY RV PERCENT PACED: 0.04 %

## 2018-10-03 ENCOUNTER — Ambulatory Visit (INDEPENDENT_AMBULATORY_CARE_PROVIDER_SITE_OTHER): Payer: Medicare Other | Admitting: *Deleted

## 2018-10-03 ENCOUNTER — Other Ambulatory Visit: Payer: Self-pay

## 2018-10-03 DIAGNOSIS — I495 Sick sinus syndrome: Secondary | ICD-10-CM

## 2018-10-04 ENCOUNTER — Telehealth: Payer: Self-pay

## 2018-10-04 LAB — CUP PACEART REMOTE DEVICE CHECK
Battery Remaining Longevity: 95 mo
Battery Voltage: 3.02 V
Brady Statistic AP VP Percent: 0.02 %
Brady Statistic AP VS Percent: 76.12 %
Brady Statistic AS VP Percent: 0.01 %
Brady Statistic AS VS Percent: 23.85 %
Brady Statistic RA Percent Paced: 76.13 %
Brady Statistic RV Percent Paced: 0.03 %
Date Time Interrogation Session: 20200424143559
Implantable Lead Implant Date: 20170320
Implantable Lead Implant Date: 20170320
Implantable Lead Location: 753859
Implantable Lead Location: 753860
Implantable Lead Model: 5076
Implantable Lead Model: 5076
Implantable Pulse Generator Implant Date: 20170320
Lead Channel Impedance Value: 323 Ohm
Lead Channel Impedance Value: 437 Ohm
Lead Channel Impedance Value: 456 Ohm
Lead Channel Impedance Value: 532 Ohm
Lead Channel Pacing Threshold Amplitude: 0.625 V
Lead Channel Pacing Threshold Amplitude: 0.875 V
Lead Channel Pacing Threshold Pulse Width: 0.4 ms
Lead Channel Pacing Threshold Pulse Width: 0.4 ms
Lead Channel Sensing Intrinsic Amplitude: 2.25 mV
Lead Channel Sensing Intrinsic Amplitude: 2.25 mV
Lead Channel Sensing Intrinsic Amplitude: 20.25 mV
Lead Channel Sensing Intrinsic Amplitude: 20.25 mV
Lead Channel Setting Pacing Amplitude: 1.5 V
Lead Channel Setting Pacing Amplitude: 2 V
Lead Channel Setting Pacing Pulse Width: 0.4 ms
Lead Channel Setting Sensing Sensitivity: 2.8 mV

## 2018-10-04 NOTE — Telephone Encounter (Signed)
Spoke with patient to remind of missed remote transmission 

## 2018-10-09 ENCOUNTER — Telehealth: Payer: Self-pay | Admitting: Internal Medicine

## 2018-10-09 NOTE — Telephone Encounter (Signed)
Virtual Visit Pre-Appointment Phone Call  "(Name), I am calling you today to discuss your upcoming appointment. We are currently trying to limit exposure to the virus that causes COVID-19 by seeing patients at home rather than in the office."  1. "What is the BEST phone number to call the day of the visit?" - include this in appointment notes  2. Do you have or have access to (through a family member/friend) a smartphone with video capability that we can use for your visit?" a. If yes - list this number in appt notes as cell (if different from BEST phone #) and list the appointment type as a VIDEO visit in appointment notes b. If no - list the appointment type as a PHONE visit in appointment notes  3. Confirm consent - "In the setting of the current Covid19 crisis, you are scheduled for a (phone or video) visit with your provider on (date) at (time).  Just as we do with many in-office visits, in order for you to participate in this visit, we must obtain consent.  If you'd like, I can send this to your mychart (if signed up) or email for you to review.  Otherwise, I can obtain your verbal consent now.  All virtual visits are billed to your insurance company just like a normal visit would be.  By agreeing to a virtual visit, we'd like you to understand that the technology does not allow for your provider to perform an examination, and thus may limit your provider's ability to fully assess your condition. If your provider identifies any concerns that need to be evaluated in person, we will make arrangements to do so.  Finally, though the technology is pretty good, we cannot assure that it will always work on either your or our end, and in the setting of a video visit, we may have to convert it to a phone-only visit.  In either situation, we cannot ensure that we have a secure connection.  Are you willing to proceed?" STAFF: Did the patient verbally acknowledge consent to telehealth visit? Document  YES/NO here: Yes  4. Advise patient to be prepared - "Two hours prior to your appointment, go ahead and check your blood pressure, pulse, oxygen saturation, and your weight (if you have the equipment to check those) and write them all down. When your visit starts, your provider will ask you for this information. If you have an Apple Watch or Kardia device, please plan to have heart rate information ready on the day of your appointment. Please have a pen and paper handy nearby the day of the visit as well."  5. Give patient instructions for MyChart download to smartphone OR [email or web address] as below if video visit (depending on what platform provider is using)  6. Inform patient they will receive a phone call 15 minutes prior to their appointment time (may be from unknown caller ID) so they should be prepared to answer    TELEPHONE CALL NOTE  LATORA QUARRY has been deemed a candidate for a follow-up tele-health visit to limit community exposure during the Covid-19 pandemic. I spoke with the patient via phone to ensure availability of phone/video source, confirm preferred email & phone number, and discuss instructions and expectations.  I reminded KALLY CADDEN to be prepared with any vital sign and/or heart rhythm information that could potentially be obtained via home monitoring, at the time of her visit. I reminded ALEKSIA FREIMAN to expect a phone call prior to  her visit.  Orinda Kenner 10/09/2018 3:35 PM

## 2018-10-11 ENCOUNTER — Encounter: Payer: Self-pay | Admitting: Cardiology

## 2018-10-11 NOTE — Progress Notes (Signed)
Remote pacemaker transmission.   

## 2018-10-17 ENCOUNTER — Encounter: Payer: Self-pay | Admitting: Internal Medicine

## 2018-10-18 ENCOUNTER — Telehealth (INDEPENDENT_AMBULATORY_CARE_PROVIDER_SITE_OTHER): Payer: Medicare Other | Admitting: Internal Medicine

## 2018-10-18 VITALS — Ht 64.5 in | Wt 133.0 lb

## 2018-10-18 DIAGNOSIS — I495 Sick sinus syndrome: Secondary | ICD-10-CM

## 2018-10-18 NOTE — Patient Instructions (Signed)
Medication Instructions: Your physician recommends that you continue on your current medications as directed. Please refer to the Current Medication list given to you today.   Labwork: None today  Procedures/Testing: None today  Follow-Up: 1 year with Dr.Taylor  Any Additional Special Instructions Will Be Listed Below (If Applicable).     If you need a refill on your cardiac medications before your next appointment, please call your pharmacy.

## 2018-10-18 NOTE — Progress Notes (Signed)
Electrophysiology TeleHealth Note   Due to national recommendations of social distancing due to COVID 19, an audio/video telehealth visit is felt to be most appropriate for this patient at this time.  See MyChart message from today for the patient's consent to telehealth for [facility].   Date:  10/18/2018   ID:  Jodi Peters, DOB [birth year], MRN [id number]  Location: patient's home  Provider location: [address], [place]  Evaluation Performed: Follow-up visit  PCP:  Jodi Fire, MD  Cardiologist:  Jodi Sable, MD Electrophysiologist:  Dr Jodi Peters  Chief Complaint:  "I'm doing alright"  History of Present Illness:    Jodi Peters is a 85 y.o. female who presents via audio/video conferencing for a telehealth visit today. She is a pleasant 85 yo woman with sinus node dysfunction, s/p medtronic PPM. Since last being seen in our clinic, the patient reports doing very well.  Today, she denies symptoms of palpitations, chest pain, shortness of breath,  lower extremity edema, dizziness, presyncope, or syncope.  The patient is otherwise without complaint today.  The patient denies symptoms of fevers, chills, cough, or new SOB worrisome for COVID 19.  Past Medical History:  Diagnosis Date  . Allergic rhinitis   . Anxiety   . Bradycardia   . Breast cancer ([place])   . Breast mass    left  . Breast pain    leftr  . Carotid atherosclerosis   . Carpal tunnel syndrome   . Cataract   . Constipation   . Cystocele 06/30/2014  . Glaucoma   . Hyperlipidemia   . Hypertension   . IBS (irritable bowel syndrome)   . Low back pain   . Mitral regurgitation    sinus bradycardia  . Osteoarthritis   . Osteopenia   . Rectocele 06/30/2014  . Seborrheic keratosis   . Venous insufficiency   . Vertigo   . Vitamin D deficiency     Past Surgical History:  Procedure Laterality Date  . ABDOMINAL HYSTERECTOMY    . APPENDECTOMY    . BACK SURGERY    . BREAST LUMPECTOMY     . CHOLECYSTECTOMY    . cyst removed from left breast    . EP IMPLANTABLE DEVICE N/A 08/30/2015   Procedure: Pacemaker Implant;  Surgeon: Evans Lance, MD;  Location: [facility];  Service: Cardiovascular;  Laterality: N/A;  . EYE SURGERY     for bleed in left eye  . keloid     left ear  . LUMBAR LAMINECTOMY/DECOMPRESSION MICRODISCECTOMY N/A 12/25/2014   Procedure: Thoracic eleven-twelve Laminectomy/Diskectomy; Lumbar three-four Laminectomy/Diskectomy;  Surgeon: Kristeen Miss, MD;  Location: [facility];  Service: Neurosurgery;  Laterality: N/A;  . spurs on back      Current Outpatient Medications  Medication Sig Dispense Refill  . acetaminophen (TYLENOL) 500 MG tablet Take 500 mg by mouth every 6 (six) hours as needed for mild pain or moderate pain.    Marland Kitchen amLODipine (NORVASC) 10 MG tablet Take 1 tablet (10 mg total) by mouth daily. 30 tablet 0  . brimonidine (ALPHAGAN) 0.2 % ophthalmic solution Place 1 drop into both eyes 2 (two) times daily.    . Cholecalciferol (VITAMIN D3) 5000 units CAPS Take 1 capsule by mouth daily.    . clopidogrel (PLAVIX) 75 MG tablet Take 1 tablet (75 mg total) by mouth daily. 30 tablet 3  . dorzolamide-timolol (COSOPT) 22.3-6.8 MG/ML ophthalmic solution Place 1 drop into both eyes 2 (  two) times daily.     . furosemide (LASIX) 20 MG tablet Take 20 mg by mouth every morning.    . latanoprost (XALATAN) 0.005 % ophthalmic solution Place 1 drop into both eyes at bedtime.    Marland Kitchen lisinopril (PRINIVIL,ZESTRIL) 40 MG tablet Take 40 mg by mouth daily.     . mirtazapine (REMERON) 15 MG tablet Take 15 mg by mouth at bedtime.    . pilocarpine (PILOCAR) 4 % ophthalmic solution Place 1 drop into both eyes 4 (four) times daily.     [name] Glycol-Propyl Glycol (SYSTANE OP) Apply 1 drop to eye as needed (for dry eye relief).     . Simethicone (GAS-X PO) Take 1 tablet by mouth daily as needed (for gas relief).     . simvastatin (ZOCOR) 20 MG tablet Take 20 mg by mouth  daily at 6 PM.      No current facility-administered medications for this visit.     Allergies:   Penicillins   Social History:  The patient  reports that she has never smoked. She has never used smokeless tobacco. She reports that she does not drink alcohol or use drugs.   Family History:  The patient's  family history includes Diabetes in her mother; Heart disease in her maternal grandmother and mother; Hyperlipidemia in her son; Hypertension in her maternal grandmother, mother, son, and son; Other in her daughter and son.   ROS:  Please see the history of present illness.   All other systems are personally reviewed and negative.    Exam:    Vital Signs:  Ht 5' 4.5" (1.638 m)   Wt 133 lb (60.3 kg)   BMI 22.48 kg/m    Labs/Other Tests and Data Reviewed:    Recent Labs: No results found for requested labs within last 8760 hours.   Wt Readings from Last 3 Encounters:  10/17/18 133 lb (60.3 kg)  09/19/17 132 lb 3.2 oz (60 kg)  09/15/16 123 lb (55.8 kg)     Other studies personally reviewed:  Last device remote is reviewed from [place] PDF dated 10/04/18 which reveals normal device function, no arrhythmias    ASSESSMENT & PLAN:    1.  Sinus node dysfunction - she is asymptomatic, s/p PPM insertion 2. PPM - her medtronic DDD PM was last check remotely 09/2018 and is working normally. Longevity about 8 years. 3. HTN - her blood pressure was not check today but she thinks it has been doing well.  4. Dizziness - she notes that it is still present but improved since I last saw.    COVID 19 screen The patient denies symptoms of COVID 19 at this time.  The importance of social distancing was discussed today.  Follow-up:  12 months Next remote: 7/20  Current medicines are reviewed at length with the patient today.   The patient does not have concerns regarding her medicines.  The following changes were made today:  none  Labs/ tests ordered today include: none No orders of  the defined types were placed in this encounter.    Patient Risk:  after full review of this patients clinical status, I feel that they are at moderate risk at this time.  Today, I have spent 15 minutes with the patient with telehealth technology discussing all of the above problems.    Signed, Cristopher Peru, MD  10/18/2018 8:54 AM     [address]  [place] [address] [phone] (office) [phone] (  fax)

## 2019-01-02 ENCOUNTER — Ambulatory Visit: Payer: Medicare Other | Admitting: *Deleted

## 2019-01-06 ENCOUNTER — Ambulatory Visit (INDEPENDENT_AMBULATORY_CARE_PROVIDER_SITE_OTHER): Payer: Medicare Other | Admitting: *Deleted

## 2019-01-06 ENCOUNTER — Telehealth: Payer: Self-pay

## 2019-01-06 DIAGNOSIS — I495 Sick sinus syndrome: Secondary | ICD-10-CM | POA: Diagnosis not present

## 2019-01-06 LAB — CUP PACEART REMOTE DEVICE CHECK
Battery Remaining Longevity: 89 mo
Battery Voltage: 3.02 V
Brady Statistic AP VP Percent: 0.03 %
Brady Statistic AP VS Percent: 63.64 %
Brady Statistic AS VP Percent: 0.02 %
Brady Statistic AS VS Percent: 36.32 %
Brady Statistic RA Percent Paced: 63.64 %
Brady Statistic RV Percent Paced: 0.05 %
Date Time Interrogation Session: 20200727125906
Implantable Lead Implant Date: 20170320
Implantable Lead Implant Date: 20170320
Implantable Lead Location: 753859
Implantable Lead Location: 753860
Implantable Lead Model: 5076
Implantable Lead Model: 5076
Implantable Pulse Generator Implant Date: 20170320
Lead Channel Impedance Value: 323 Ohm
Lead Channel Impedance Value: 437 Ohm
Lead Channel Impedance Value: 456 Ohm
Lead Channel Impedance Value: 551 Ohm
Lead Channel Pacing Threshold Amplitude: 0.625 V
Lead Channel Pacing Threshold Amplitude: 0.75 V
Lead Channel Pacing Threshold Pulse Width: 0.4 ms
Lead Channel Pacing Threshold Pulse Width: 0.4 ms
Lead Channel Sensing Intrinsic Amplitude: 2.25 mV
Lead Channel Sensing Intrinsic Amplitude: 2.25 mV
Lead Channel Sensing Intrinsic Amplitude: 20.5 mV
Lead Channel Sensing Intrinsic Amplitude: 20.5 mV
Lead Channel Setting Pacing Amplitude: 1.5 V
Lead Channel Setting Pacing Amplitude: 2 V
Lead Channel Setting Pacing Pulse Width: 0.4 ms
Lead Channel Setting Sensing Sensitivity: 2.8 mV

## 2019-01-06 NOTE — Telephone Encounter (Signed)
Spoke with patient to remind of missed remote transmission 

## 2019-01-14 ENCOUNTER — Other Ambulatory Visit: Payer: Self-pay

## 2019-01-24 ENCOUNTER — Encounter: Payer: Self-pay | Admitting: Cardiology

## 2019-01-24 NOTE — Progress Notes (Signed)
Remote pacemaker transmission.   

## 2019-04-08 ENCOUNTER — Ambulatory Visit (INDEPENDENT_AMBULATORY_CARE_PROVIDER_SITE_OTHER): Payer: Medicare Other | Admitting: *Deleted

## 2019-04-08 DIAGNOSIS — I495 Sick sinus syndrome: Secondary | ICD-10-CM | POA: Diagnosis not present

## 2019-04-08 DIAGNOSIS — R55 Syncope and collapse: Secondary | ICD-10-CM

## 2019-04-09 LAB — CUP PACEART REMOTE DEVICE CHECK
Battery Remaining Longevity: 83 mo
Battery Voltage: 3.01 V
Brady Statistic AP VP Percent: 0.03 %
Brady Statistic AP VS Percent: 60 %
Brady Statistic AS VP Percent: 0.02 %
Brady Statistic AS VS Percent: 39.95 %
Brady Statistic RA Percent Paced: 60.01 %
Brady Statistic RV Percent Paced: 0.05 %
Date Time Interrogation Session: 20201027184212
Implantable Lead Implant Date: 20170320
Implantable Lead Implant Date: 20170320
Implantable Lead Location: 753859
Implantable Lead Location: 753860
Implantable Lead Model: 5076
Implantable Lead Model: 5076
Implantable Pulse Generator Implant Date: 20170320
Lead Channel Impedance Value: 323 Ohm
Lead Channel Impedance Value: 456 Ohm
Lead Channel Impedance Value: 456 Ohm
Lead Channel Impedance Value: 551 Ohm
Lead Channel Pacing Threshold Amplitude: 0.625 V
Lead Channel Pacing Threshold Amplitude: 0.75 V
Lead Channel Pacing Threshold Pulse Width: 0.4 ms
Lead Channel Pacing Threshold Pulse Width: 0.4 ms
Lead Channel Sensing Intrinsic Amplitude: 2.125 mV
Lead Channel Sensing Intrinsic Amplitude: 2.125 mV
Lead Channel Sensing Intrinsic Amplitude: 20.75 mV
Lead Channel Sensing Intrinsic Amplitude: 20.75 mV
Lead Channel Setting Pacing Amplitude: 1.5 V
Lead Channel Setting Pacing Amplitude: 2 V
Lead Channel Setting Pacing Pulse Width: 0.4 ms
Lead Channel Setting Sensing Sensitivity: 2.8 mV

## 2019-04-30 NOTE — Progress Notes (Signed)
Remote pacemaker transmission.   

## 2019-05-21 ENCOUNTER — Telehealth: Payer: Self-pay | Admitting: Internal Medicine

## 2019-05-21 NOTE — Telephone Encounter (Signed)
Called pt. No answer, no voicemail. Unable to leave message. 

## 2019-05-21 NOTE — Telephone Encounter (Signed)
Left voicemail stating she had chest pain 2 nights ago and was wanting to speak w/ Dr. Lovena Le   [phone]

## 2019-05-21 NOTE — Telephone Encounter (Signed)
Spoke with pt. She is doing well, has not had any more issues since a few nights ago. She will call if she has any more episodes.

## 2019-07-08 ENCOUNTER — Ambulatory Visit (INDEPENDENT_AMBULATORY_CARE_PROVIDER_SITE_OTHER): Payer: Medicare Other | Admitting: *Deleted

## 2019-07-08 ENCOUNTER — Telehealth: Payer: Self-pay | Admitting: Internal Medicine

## 2019-07-08 DIAGNOSIS — I495 Sick sinus syndrome: Secondary | ICD-10-CM | POA: Diagnosis not present

## 2019-07-08 NOTE — Telephone Encounter (Signed)
Follow Up:    Please call, concerning the letter she received.

## 2019-07-08 NOTE — Telephone Encounter (Signed)
Spoke with pt, she was calling about her remote transmission, it appears she was scheduled for today, no transmission received as of yet.  Assisted pt with sending transmission manually.  Transmission received successfully.

## 2019-07-09 LAB — CUP PACEART REMOTE DEVICE CHECK
Battery Remaining Longevity: 83 mo
Battery Voltage: 3.01 V
Brady Statistic AP VP Percent: 0.02 %
Brady Statistic AP VS Percent: 67.31 %
Brady Statistic AS VP Percent: 0.02 %
Brady Statistic AS VS Percent: 32.65 %
Brady Statistic RA Percent Paced: 67.3 %
Brady Statistic RV Percent Paced: 0.04 %
Date Time Interrogation Session: 20210126155845
Implantable Lead Implant Date: 20170320
Implantable Lead Implant Date: 20170320
Implantable Lead Location: 753859
Implantable Lead Location: 753860
Implantable Lead Model: 5076
Implantable Lead Model: 5076
Implantable Pulse Generator Implant Date: 20170320
Lead Channel Impedance Value: 304 Ohm
Lead Channel Impedance Value: 418 Ohm
Lead Channel Impedance Value: 456 Ohm
Lead Channel Impedance Value: 551 Ohm
Lead Channel Pacing Threshold Amplitude: 0.75 V
Lead Channel Pacing Threshold Amplitude: 0.75 V
Lead Channel Pacing Threshold Pulse Width: 0.4 ms
Lead Channel Pacing Threshold Pulse Width: 0.4 ms
Lead Channel Sensing Intrinsic Amplitude: 2 mV
Lead Channel Sensing Intrinsic Amplitude: 2 mV
Lead Channel Sensing Intrinsic Amplitude: 20.625 mV
Lead Channel Sensing Intrinsic Amplitude: 20.625 mV
Lead Channel Setting Pacing Amplitude: 1.5 V
Lead Channel Setting Pacing Amplitude: 2 V
Lead Channel Setting Pacing Pulse Width: 0.4 ms
Lead Channel Setting Sensing Sensitivity: 2.8 mV

## 2019-10-08 ENCOUNTER — Telehealth: Payer: Self-pay

## 2019-10-08 NOTE — Telephone Encounter (Signed)
The pt tried to send a transmission but received the error code [id number].  I conference the pt with [employer] to get additional help. [employer] is going to call the pt back in one hour.

## 2019-10-08 NOTE — Telephone Encounter (Signed)
Unable to speak  with patient to remind of missed remote transmission 

## 2019-10-28 ENCOUNTER — Telehealth: Payer: Self-pay

## 2019-10-28 NOTE — Telephone Encounter (Signed)
Patient called in about her monitor still not working; we have tried multiple things and nothing worked. Patient was given tech support and will call me back

## 2019-10-28 NOTE — Telephone Encounter (Signed)
Spoke with patient and I have emailed medtronic to send her a new monitor due to long wait time on the phone. Patient aware and will call us once she receives it.

## 2019-10-28 NOTE — Telephone Encounter (Signed)
      I went in pt's chart to see who she need to talk to. She said she was disconnected.

## 2019-10-31 ENCOUNTER — Ambulatory Visit (INDEPENDENT_AMBULATORY_CARE_PROVIDER_SITE_OTHER): Payer: Medicare Other | Admitting: *Deleted

## 2019-10-31 DIAGNOSIS — I495 Sick sinus syndrome: Secondary | ICD-10-CM

## 2019-11-03 LAB — CUP PACEART REMOTE DEVICE CHECK
Battery Remaining Longevity: 83 mo
Battery Voltage: 3.01 V
Brady Statistic AP VP Percent: 0.02 %
Brady Statistic AP VS Percent: 55.66 %
Brady Statistic AS VP Percent: 0.02 %
Brady Statistic AS VS Percent: 44.29 %
Brady Statistic RA Percent Paced: 55.67 %
Brady Statistic RV Percent Paced: 0.04 %
Date Time Interrogation Session: 20210521175249
Implantable Lead Implant Date: 20170320
Implantable Lead Implant Date: 20170320
Implantable Lead Location: 753859
Implantable Lead Location: 753860
Implantable Lead Model: 5076
Implantable Lead Model: 5076
Implantable Pulse Generator Implant Date: 20170320
Lead Channel Impedance Value: 304 Ohm
Lead Channel Impedance Value: 437 Ohm
Lead Channel Impedance Value: 437 Ohm
Lead Channel Impedance Value: 532 Ohm
Lead Channel Pacing Threshold Amplitude: 0.625 V
Lead Channel Pacing Threshold Amplitude: 0.75 V
Lead Channel Pacing Threshold Pulse Width: 0.4 ms
Lead Channel Pacing Threshold Pulse Width: 0.4 ms
Lead Channel Sensing Intrinsic Amplitude: 19.75 mV
Lead Channel Sensing Intrinsic Amplitude: 19.75 mV
Lead Channel Sensing Intrinsic Amplitude: 2.25 mV
Lead Channel Sensing Intrinsic Amplitude: 2.25 mV
Lead Channel Setting Pacing Amplitude: 1.5 V
Lead Channel Setting Pacing Amplitude: 2 V
Lead Channel Setting Pacing Pulse Width: 0.4 ms
Lead Channel Setting Sensing Sensitivity: 2.8 mV

## 2019-11-03 NOTE — Progress Notes (Signed)
Remote pacemaker transmission.   

## 2019-11-14 ENCOUNTER — Encounter: Payer: Self-pay | Admitting: Internal Medicine

## 2019-11-14 ENCOUNTER — Ambulatory Visit (INDEPENDENT_AMBULATORY_CARE_PROVIDER_SITE_OTHER): Payer: Medicare Other | Admitting: Internal Medicine

## 2019-11-14 ENCOUNTER — Other Ambulatory Visit: Payer: Self-pay

## 2019-11-14 VITALS — BP 167/70 | HR 66 | Ht 64.5 in | Wt 127.8 lb

## 2019-11-14 DIAGNOSIS — I495 Sick sinus syndrome: Secondary | ICD-10-CM

## 2019-11-14 LAB — CUP PACEART INCLINIC DEVICE CHECK
Battery Remaining Longevity: 82 mo
Battery Voltage: 3.01 V
Brady Statistic AP VP Percent: 0.02 %
Brady Statistic AP VS Percent: 55.61 %
Brady Statistic AS VP Percent: 0.02 %
Brady Statistic AS VS Percent: 44.34 %
Brady Statistic RA Percent Paced: 55.62 %
Brady Statistic RV Percent Paced: 0.04 %
Date Time Interrogation Session: 20210604111448
Implantable Lead Implant Date: 20170320
Implantable Lead Implant Date: 20170320
Implantable Lead Location: 753859
Implantable Lead Location: 753860
Implantable Lead Model: 5076
Implantable Lead Model: 5076
Implantable Pulse Generator Implant Date: 20170320
Lead Channel Impedance Value: 323 Ohm
Lead Channel Impedance Value: 456 Ohm
Lead Channel Impedance Value: 475 Ohm
Lead Channel Impedance Value: 570 Ohm
Lead Channel Pacing Threshold Amplitude: 0.75 V
Lead Channel Pacing Threshold Amplitude: 1 V
Lead Channel Pacing Threshold Pulse Width: 0.4 ms
Lead Channel Pacing Threshold Pulse Width: 0.4 ms
Lead Channel Sensing Intrinsic Amplitude: 1.6 mV
Lead Channel Sensing Intrinsic Amplitude: 21.875 mV
Lead Channel Setting Pacing Amplitude: 1.5 V
Lead Channel Setting Pacing Amplitude: 2 V
Lead Channel Setting Pacing Pulse Width: 0.4 ms
Lead Channel Setting Sensing Sensitivity: 2.8 mV

## 2019-11-14 NOTE — Progress Notes (Signed)
HPI Jodi Peters returns today for followup of HTN and sinus node dysfunction. She is a pleasant 86 yo woman with a h/o HTN, snd s/p PPM and chronic diastolic heart failure. She feels well. She notes very mild peripheral edema. She has not had angina. She denies chest pain or sob.  Allergies  Allergen Reactions  . Penicillins Rash    Has patient had a PCN reaction causing immediate rash, facial/tongue/throat swelling, SOB or lightheadedness with hypotension: Yes Has patient had a PCN reaction causing severe rash involving mucus membranes or skin necrosis: No Has patient had a PCN reaction that required hospitalization No Has patient had a PCN reaction occurring within the last 10 years: Yes If all of the above answers are "NO", then may proceed with Cephalosporin use.     Current Outpatient Medications  Medication Sig Dispense Refill  . acetaminophen (TYLENOL) 500 MG tablet Take 500 mg by mouth every 6 (six) hours as needed for mild pain or moderate pain.    Marland Kitchen amLODipine (NORVASC) 10 MG tablet Take 1 tablet (10 mg total) by mouth daily. 30 tablet 0  . betamethasone dipropionate 0.05 % cream APPLY TO THE AFFECTEDVAREA(S) TWICE DAILY ASDNEEDED. (NOT TO FACE, GROIN, UNDER ARMS.)    . brimonidine (ALPHAGAN) 0.2 % ophthalmic solution Place 1 drop into both eyes 2 (two) times daily.    . Cholecalciferol (VITAMIN D3) 5000 units CAPS Take 1 capsule by mouth daily.    . clopidogrel (PLAVIX) 75 MG tablet Take 1 tablet (75 mg total) by mouth daily. 30 tablet 3  . dorzolamide-timolol (COSOPT) 22.3-6.8 MG/ML ophthalmic solution Place 1 drop into both eyes 2 (two) times daily.     Marland Kitchen latanoprost (XALATAN) 0.005 % ophthalmic solution Place 1 drop into both eyes at bedtime.    Marland Kitchen lisinopril (PRINIVIL,ZESTRIL) 40 MG tablet Take 40 mg by mouth daily.     . mirtazapine (REMERON) 15 MG tablet Take 15 mg by mouth at bedtime.    [name] Glycol-Propyl Glycol (SYSTANE OP) Apply 1 drop to eye as needed  (for dry eye relief).     . Simethicone (GAS-X PO) Take 1 tablet by mouth daily as needed (for gas relief).     . furosemide (LASIX) 20 MG tablet Take 20 mg by mouth every morning.    . pilocarpine (PILOCAR) 4 % ophthalmic solution Place 1 drop into both eyes 4 (four) times daily.     . simvastatin (ZOCOR) 20 MG tablet Take 20 mg by mouth daily at 6 PM.      No current facility-administered medications for this visit.     Past Medical History:  Diagnosis Date  . Allergic rhinitis   . Anxiety   . Bradycardia   . Breast cancer ([place])   . Breast mass    left  . Breast pain    leftr  . Carotid atherosclerosis   . Carpal tunnel syndrome   . Cataract   . Constipation   . Cystocele 06/30/2014  . Glaucoma   . Hyperlipidemia   . Hypertension   . IBS (irritable bowel syndrome)   . Low back pain   . Mitral regurgitation    sinus bradycardia  . Osteoarthritis   . Osteopenia   . Rectocele 06/30/2014  . Seborrheic keratosis   . Venous insufficiency   . Vertigo   . Vitamin D deficiency     ROS:   All systems reviewed and negative except as noted in the HPI.  Past Surgical History:  Procedure Laterality Date  . ABDOMINAL HYSTERECTOMY    . APPENDECTOMY    . BACK SURGERY    . BREAST LUMPECTOMY    . CHOLECYSTECTOMY    . cyst removed from left breast    . EP IMPLANTABLE DEVICE N/A 08/30/2015   Procedure: Pacemaker Implant;  Surgeon: Evans Lance, MD;  Location: [facility];  Service: Cardiovascular;  Laterality: N/A;  . EYE SURGERY     for bleed in left eye  . keloid     left ear  . LUMBAR LAMINECTOMY/DECOMPRESSION MICRODISCECTOMY N/A 12/25/2014   Procedure: Thoracic eleven-twelve Laminectomy/Diskectomy; Lumbar three-four Laminectomy/Diskectomy;  Surgeon: Kristeen Miss, MD;  Location: [facility];  Service: Neurosurgery;  Laterality: N/A;  . spurs on back       Family History  Problem Relation Age of Onset  . Heart disease Mother   . Hypertension Mother   .  Diabetes Mother   . Other Daughter        left breast nodule  . Hypertension Son   . Hyperlipidemia Son   . Heart disease Maternal Grandmother   . Hypertension Maternal Grandmother   . Hypertension Son   . Other Son        boils     Social History   Socioeconomic History  . Marital status: Divorced    Spouse name: Not on file  . Number of children: Not on file  . Years of education: Not on file  . Highest education level: Not on file  Occupational History  . Not on file  Tobacco Use  . Smoking status: Never Smoker  . Smokeless tobacco: Never Used  Substance and Sexual Activity  . Alcohol use: No    Alcohol/week: 0.0 standard drinks  . Drug use: No  . Sexual activity: Never    Birth control/protection: Surgical    Comment: hyst  Other Topics Concern  . Not on file  Social History Narrative  . Not on file   Social Determinants of Health   Financial Resource Strain:   . Difficulty of Paying Living Expenses:   Food Insecurity:   . Worried About Charity fundraiser in the Last Year:   . Arboriculturist in the Last Year:   Transportation Needs:   . Film/video editor (Medical):   Marland Kitchen Lack of Transportation (Non-Medical):   Physical Activity:   . Days of Exercise per Week:   . Minutes of Exercise per Session:   Stress:   . Feeling of Stress :   Social Connections:   . Frequency of Communication with Friends and Family:   . Frequency of Social Gatherings with Friends and Family:   . Attends Religious Services:   . Active Member of Clubs or Organizations:   . Attends Archivist Meetings:   Marland Kitchen Marital Status:   Intimate Partner Violence:   . Fear of Current or Ex-Partner:   . Emotionally Abused:   Marland Kitchen Physically Abused:   . Sexually Abused:      Ht 5' 4.5" (1.638 m)   Wt 127 lb 12.8 oz (58 kg)   BMI 21.60 kg/m   Physical Exam:  Well appearing NAD HEENT: Unremarkable Neck:  No JVD, no thyromegally Lymphatics:  No adenopathy Back:  No CVA  tenderness Lungs:  Clear with no wheezes HEART:  Regular rate rhythm, no murmurs, no rubs, no clicks Abd:  soft, positive bowel sounds, no organomegally, no rebound, no guarding Ext:  2 plus pulses, 1+ edema, no cyanosis, no clubbing Skin:  No rashes no nodules Neuro:  CN II through XII intact, motor grossly intact  EKG - nsr with atrial pacing  DEVICE  Normal device function.  See PaceArt for details.   Assess/Plan: 1. Sinus node dysfunction - she is doing well, s/p PPM insertion.  2. PPM - her medtronic DDD PM is working [employer]. We will recheck in several months. 3. HTN - we discussed switching her meds but she would like to stay with amlodipine for HTN.  4. Peripheral edema - I suspect that this is due to her amlodipine. I offered the option of switching but she declines.   Mikle [email or web address].D.

## 2019-11-14 NOTE — Patient Instructions (Signed)
Medication Instructions:  Your physician recommends that you continue on your current medications as directed. Please refer to the Current Medication list given to you today.  *If you need a refill on your cardiac medications before your next appointment, please call your pharmacy*   Lab Work: None today If you have labs (blood work) drawn today and your tests are completely normal, you will receive your results only by: Marland Kitchen MyChart Message (if you have MyChart) OR . A paper copy in the mail If you have any lab test that is abnormal or we need to change your treatment, we will call you to review the results.   Testing/Procedures: None today   Follow-Up: At [facility], you and your health needs are our priority.  As part of our continuing mission to provide you with exceptional heart care, we have created designated Provider Care Teams.  These Care Teams include your primary Cardiologist (physician) and Advanced Practice Providers (APPs -  Physician Assistants and Nurse Practitioners) who all work together to provide you with the care you need, when you need it.  We recommend signing up for the patient portal called "MyChart".  Sign up information is provided on this After Visit Summary.  MyChart is used to connect with patients for Virtual Visits (Telemedicine).  Patients are able to view lab/test results, encounter notes, upcoming appointments, etc.  Non-urgent messages can be sent to your provider as well.   To learn more about what you can do with MyChart, go to [email or web address].    Your next appointment:   12 month(s)  The format for your next appointment:   In Person  Provider:  Dr.Gregory Lovena Le   Other Instructions None       Thank you for choosing [facility] !

## 2019-12-02 ENCOUNTER — Telehealth: Payer: Self-pay | Admitting: Internal Medicine

## 2019-12-02 NOTE — Telephone Encounter (Signed)
Per Pt she is to call Dr.Taylor if she wants to changes her medications.  Please call [phone]   Thanks renee

## 2019-12-02 NOTE — Telephone Encounter (Signed)
Pt notified that Dr. Lovena Le is out of the office. And there will be a delay. Pt thankful and voiced understanding.

## 2019-12-02 NOTE — Telephone Encounter (Signed)
Pt called to say she and Dr. Lovena Le discussed switching her from amlodipine d/t swelling. Pt has now completed current supply of amlodipine and is ready to switch. Please advise.

## 2019-12-03 NOTE — Telephone Encounter (Signed)
Stop amlodipine and start coreg 6.25 bid. GT

## 2019-12-04 MED ORDER — CARVEDILOL 3.125 MG PO TABS: 3.1250 mg | ORAL_TABLET | Freq: Two times a day (BID) | ORAL | 3 refills | Status: DC

## 2019-12-04 NOTE — Addendum Note (Signed)
Addended by: Barbarann Ehlers A on: 12/04/2019 08:22 AM   Modules accepted: Orders

## 2019-12-04 NOTE — Telephone Encounter (Signed)
Son notified, e-scribed coreg to [employer]

## 2020-02-20 ENCOUNTER — Ambulatory Visit (INDEPENDENT_AMBULATORY_CARE_PROVIDER_SITE_OTHER): Payer: Medicare Other | Admitting: *Deleted

## 2020-02-20 DIAGNOSIS — I495 Sick sinus syndrome: Secondary | ICD-10-CM | POA: Diagnosis not present

## 2020-02-20 LAB — CUP PACEART REMOTE DEVICE CHECK
Battery Remaining Longevity: 84 mo
Battery Voltage: 3.01 V
Brady Statistic AP VP Percent: 0.01 %
Brady Statistic AP VS Percent: 44.11 %
Brady Statistic AS VP Percent: 0.03 %
Brady Statistic AS VS Percent: 55.85 %
Brady Statistic RA Percent Paced: 44.1 %
Brady Statistic RV Percent Paced: 0.04 %
Date Time Interrogation Session: 20210909141728
Implantable Lead Implant Date: 20170320
Implantable Lead Implant Date: 20170320
Implantable Lead Location: 753859
Implantable Lead Location: 753860
Implantable Lead Model: 5076
Implantable Lead Model: 5076
Implantable Pulse Generator Implant Date: 20170320
Lead Channel Impedance Value: 342 Ohm
Lead Channel Impedance Value: 437 Ohm
Lead Channel Impedance Value: 456 Ohm
Lead Channel Impedance Value: 532 Ohm
Lead Channel Pacing Threshold Amplitude: 0.75 V
Lead Channel Pacing Threshold Amplitude: 0.75 V
Lead Channel Pacing Threshold Pulse Width: 0.4 ms
Lead Channel Pacing Threshold Pulse Width: 0.4 ms
Lead Channel Sensing Intrinsic Amplitude: 2.75 mV
Lead Channel Sensing Intrinsic Amplitude: 2.75 mV
Lead Channel Sensing Intrinsic Amplitude: 21.25 mV
Lead Channel Sensing Intrinsic Amplitude: 21.25 mV
Lead Channel Setting Pacing Amplitude: 1.5 V
Lead Channel Setting Pacing Amplitude: 2 V
Lead Channel Setting Pacing Pulse Width: 0.4 ms
Lead Channel Setting Sensing Sensitivity: 2.8 mV

## 2020-02-23 NOTE — Progress Notes (Signed)
Remote pacemaker transmission.   

## 2020-05-17 ENCOUNTER — Telehealth: Payer: Self-pay | Admitting: Internal Medicine

## 2020-05-17 NOTE — Telephone Encounter (Signed)
Returning patients phone call.  States she is concerned her device has not been checked since September. Advised her device will be checked remotely 05/21/20. Advised she will have to send manually. Verbalized understanding.

## 2020-05-17 NOTE — Telephone Encounter (Signed)
Called Jodi Peters and Jodi Peters stated that she would like to talk to Dr. Tanna Furry nurse concerning the area around her pacemaker. Jodi Peters would like a call back as soon as possible. Please address

## 2020-05-17 NOTE — Telephone Encounter (Signed)
Patient is calling to speak with Dr. Lovena Le and his nurse. She states that it is very important but did not want to disclose any information to me. Please advise.

## 2020-05-21 ENCOUNTER — Ambulatory Visit (INDEPENDENT_AMBULATORY_CARE_PROVIDER_SITE_OTHER): Payer: Medicare Other

## 2020-05-21 DIAGNOSIS — I495 Sick sinus syndrome: Secondary | ICD-10-CM | POA: Diagnosis not present

## 2020-05-21 NOTE — Telephone Encounter (Signed)
I helped the pt send her remote transmission.

## 2020-05-24 ENCOUNTER — Telehealth: Payer: Self-pay

## 2020-05-24 LAB — CUP PACEART REMOTE DEVICE CHECK
Battery Remaining Longevity: 84 mo
Battery Voltage: 3.01 V
Brady Statistic AP VP Percent: 0.01 %
Brady Statistic AP VS Percent: 33.12 %
Brady Statistic AS VP Percent: 0.03 %
Brady Statistic AS VS Percent: 66.84 %
Brady Statistic RA Percent Paced: 33.12 %
Brady Statistic RV Percent Paced: 0.04 %
Date Time Interrogation Session: 20211210120543
Implantable Lead Implant Date: 20170320
Implantable Lead Implant Date: 20170320
Implantable Lead Location: 753859
Implantable Lead Location: 753860
Implantable Lead Model: 5076
Implantable Lead Model: 5076
Implantable Pulse Generator Implant Date: 20170320
Lead Channel Impedance Value: 323 Ohm
Lead Channel Impedance Value: 437 Ohm
Lead Channel Impedance Value: 437 Ohm
Lead Channel Impedance Value: 532 Ohm
Lead Channel Pacing Threshold Amplitude: 0.75 V
Lead Channel Pacing Threshold Amplitude: 0.875 V
Lead Channel Pacing Threshold Pulse Width: 0.4 ms
Lead Channel Pacing Threshold Pulse Width: 0.4 ms
Lead Channel Sensing Intrinsic Amplitude: 2.5 mV
Lead Channel Sensing Intrinsic Amplitude: 2.5 mV
Lead Channel Sensing Intrinsic Amplitude: 20 mV
Lead Channel Sensing Intrinsic Amplitude: 20 mV
Lead Channel Setting Pacing Amplitude: 1.75 V
Lead Channel Setting Pacing Amplitude: 2 V
Lead Channel Setting Pacing Pulse Width: 0.4 ms
Lead Channel Setting Sensing Sensitivity: 2.8 mV

## 2020-05-24 NOTE — Telephone Encounter (Signed)
Returning patient phone call. Patient has questions about co-payments. Advised patient I do not have that answer for her but I will help her find out. Call transferred to billing.

## 2020-06-03 NOTE — Progress Notes (Signed)
Remote pacemaker transmission.   

## 2020-08-04 ENCOUNTER — Encounter: Payer: Self-pay | Admitting: Emergency Medicine

## 2020-08-04 ENCOUNTER — Other Ambulatory Visit: Payer: Self-pay

## 2020-08-04 ENCOUNTER — Ambulatory Visit
Admission: EM | Admit: 2020-08-04 | Discharge: 2020-08-04 | Disposition: A | Discharge status: Home / Self Care | Payer: Medicare Other | Attending: Physician Assistant | Admitting: Physician Assistant

## 2020-08-04 DIAGNOSIS — I10 Essential (primary) hypertension: Secondary | ICD-10-CM | POA: Diagnosis not present

## 2020-08-04 MED ORDER — CLONIDINE HCL 0.1 MG PO TABS
0.1000 mg | ORAL_TABLET | Freq: Once | ORAL | Status: AC
Start: 2020-08-04 — End: 2020-08-04
  Administered 2020-08-04: 0.1 mg via ORAL

## 2020-08-04 NOTE — ED Triage Notes (Signed)
B/p has been up for past 3 days.  Reports some dizziness today.  Pt takes bp meds and has been taking them as prescribed.

## 2020-08-04 NOTE — Discharge Instructions (Signed)
Avoid salt/sodium. Take your blood pressure medication as directed

## 2020-08-06 NOTE — ED Provider Notes (Signed)
[facility]    CSN: [id number] Arrival date & time: 08/04/20  1320      History   Chief Complaint Chief Complaint  Patient presents with  . Hypertension    HPI Jodi Peters is a 87 y.o. female.   The history is provided by the patient. No language interpreter was used.  Hypertension This is a new problem. The current episode started 2 days ago. The problem occurs constantly. The problem has not changed since onset.Nothing aggravates the symptoms. Nothing relieves the symptoms. She has tried nothing for the symptoms. The treatment provided no relief.  Pt reports her blood pressure is high.  Pt reports she has been eating salty [place] legs and it caused blood pressure to go up.  Pt reports blood pressure has never been this high before   Past Medical History:  Diagnosis Date  . Allergic rhinitis   . Anxiety   . Bradycardia   . Breast cancer ([place])   . Breast mass    left  . Breast pain    leftr  . Carotid atherosclerosis   . Carpal tunnel syndrome   . Cataract   . Constipation   . Cystocele 06/30/2014  . Glaucoma   . Hyperlipidemia   . Hypertension   . IBS (irritable bowel syndrome)   . Low back pain   . Mitral regurgitation    sinus bradycardia  . Osteoarthritis   . Osteopenia   . Rectocele 06/30/2014  . Seborrheic keratosis   . Venous insufficiency   . Vertigo   . Vitamin D deficiency     Patient Active Problem List   Diagnosis Date Noted  . Sinus node dysfunction ([place]) 08/30/2015  . Near syncope 06/21/2015  . Pain in joint, shoulder region 02/05/2015  . TIA (transient ischemic attack) 01/26/2015  . Leukocytosis 01/18/2015  . UTI (urinary tract infection) 12/29/2014  . Spinal stenosis of thoracic region   . Spinal stenosis 12/22/2014  . Spinal cord compression ([place]) 12/22/2014  . Essential hypertension 12/22/2014  . Spinal stenosis of lumbar region   . Rectocele 06/30/2014  . Cystocele 06/30/2014  . HERPES ZOSTER 01/14/2010  . MITRAL  REGURGITATION 01/14/2010  . Cerebrovascular disease 01/14/2010  . INSOMNIA 12/23/2008  . OSTEOPENIA 06/01/2008  . BRADYCARDIA 01/27/2008  . INSUFFICIENCY, VENOUS NOS 01/10/2007  . ALLERGIC RHINITIS, SEASONAL 10/10/2006  . SEBORRHEIC KERATOSIS 08/08/2006  . HYPERLIPIDEMIA 05/23/2006  . Anxiety state 05/23/2006  . CARPAL TUNNEL SYNDROME 05/23/2006  . Glaucoma 05/23/2006  . CATARACT NOS 05/23/2006  . HYPERTENSION 05/23/2006  . CONSTIPATION 05/23/2006  . IBS 05/23/2006  . OSTEOARTHRITIS 05/23/2006  . LOW BACK PAIN 05/23/2006  . BREAST CANCER, HX OF 05/23/2006    Past Surgical History:  Procedure Laterality Date  . ABDOMINAL HYSTERECTOMY    . APPENDECTOMY    . BACK SURGERY    . BREAST LUMPECTOMY    . CHOLECYSTECTOMY    . cyst removed from left breast    . EP IMPLANTABLE DEVICE N/A 08/30/2015   Procedure: Pacemaker Implant;  Surgeon: Evans Lance, MD;  Location: [facility];  Service: Cardiovascular;  Laterality: N/A;  . EYE SURGERY     for bleed in left eye  . keloid     left ear  . LUMBAR LAMINECTOMY/DECOMPRESSION MICRODISCECTOMY N/A 12/25/2014   Procedure: Thoracic eleven-twelve Laminectomy/Diskectomy; Lumbar three-four Laminectomy/Diskectomy;  Surgeon: Kristeen Miss, MD;  Location: [facility];  Service: Neurosurgery;  Laterality: N/A;  . spurs on back  OB History    Gravida  5   Para  5   Term      Preterm      AB      Living  5     SAB      IAB      Ectopic      Multiple      Live Births               Home Medications    Prior to Admission medications   Medication Sig Start Date End Date Taking? Authorizing Provider  acetaminophen (TYLENOL) 500 MG tablet Take 500 mg by mouth every 6 (six) hours as needed for mild pain or moderate pain.    [provider]  betamethasone dipropionate 0.05 % cream APPLY TO THE AFFECTEDVAREA(S) TWICE DAILY ASDNEEDED. (NOT TO FACE, GROIN, UNDER ARMS.) 10/09/19   [provider]   brimonidine (ALPHAGAN) 0.2 % ophthalmic solution Place 1 drop into both eyes 2 (two) times daily. 05/13/15   [provider]  carvedilol (COREG) 3.125 MG tablet Take 1 tablet (3.125 mg total) by mouth 2 (two) times daily. 12/04/19 03/03/20  Evans Lance, MD  Cholecalciferol (VITAMIN D3) 5000 units CAPS Take 1 capsule by mouth daily.    [provider]  clopidogrel (PLAVIX) 75 MG tablet Take 1 tablet (75 mg total) by mouth daily. 06/23/15   Rosita Fire, MD  dorzolamide-timolol (COSOPT) 22.3-6.8 MG/ML ophthalmic solution Place 1 drop into both eyes 2 (two) times daily.     [provider]  furosemide (LASIX) 20 MG tablet Take 20 mg by mouth every morning. 11/25/15   [provider]  latanoprost (XALATAN) 0.005 % ophthalmic solution Place 1 drop into both eyes at bedtime.    [provider]  lisinopril (PRINIVIL,ZESTRIL) 40 MG tablet Take 40 mg by mouth daily.  11/25/15   [provider]  mirtazapine (REMERON) 15 MG tablet Take 15 mg by mouth at bedtime.    [provider]  pilocarpine (PILOCAR) 4 % ophthalmic solution Place 1 drop into both eyes 4 (four) times daily.  02/17/14   [provider]  Polyethyl Glycol-Propyl Glycol (SYSTANE OP) Apply 1 drop to eye as needed (for dry eye relief).     [provider]  Simethicone (GAS-X PO) Take 1 tablet by mouth daily as needed (for gas relief).     [provider]  simvastatin (ZOCOR) 20 MG tablet Take 20 mg by mouth daily at 6 PM.  07/13/15   [provider]    Family History Family History  Problem Relation Age of Onset  . Heart disease Mother   . Hypertension Mother   . Diabetes Mother   . Other Daughter        left breast nodule  . Hypertension Son   . Hyperlipidemia Son   . Heart disease Maternal Grandmother   . Hypertension Maternal Grandmother   . Hypertension Son   . Other Son        boils    Social History Social History   Tobacco Use   . Smoking status: Never Smoker  . Smokeless tobacco: Never Used  Vaping Use  . Vaping Use: Never used  Substance Use Topics  . Alcohol use: No    Alcohol/week: 0.0 standard drinks  . Drug use: No     Allergies   Penicillins   Review of Systems Review of Systems  All other systems reviewed and are negative.  Physical Exam Triage Vital Signs ED Triage Vitals  Enc Vitals Group     BP 08/04/20 1434 (!) 236/92     Pulse Rate 08/04/20 1434 62     Resp 08/04/20 1434 18     Temp 08/04/20 1434 98.5 F (36.9 C)     Temp Source 08/04/20 1434 Oral     SpO2 08/04/20 1434 95 %     Weight --      Height --      Head Circumference --      Peak Flow --      Pain Score 08/04/20 1433 0     Pain Loc --      Pain Edu? --      Excl. in [place]? --    No data found.  Updated Vital Signs BP (!) 195/77 (BP Location: Right Arm) Comment: recheck  Pulse 62   Temp 98.5 F (36.9 C) (Oral)   Resp 18   SpO2 95%   Visual Acuity Right Eye Distance:   Left Eye Distance:   Bilateral Distance:    Right Eye Near:   Left Eye Near:    Bilateral Near:     Physical Exam Vitals reviewed.  HENT:     Head: Normocephalic.     Right Ear: Tympanic membrane normal.     Left Ear: Tympanic membrane normal.  Cardiovascular:     Rate and Rhythm: Normal rate.     Pulses: Normal pulses.  Pulmonary:     Effort: Pulmonary effort is normal.  Musculoskeletal:        General: Normal range of motion.     Right lower leg: No edema.     Left lower leg: No edema.  Skin:    General: Skin is warm.  Neurological:     General: No focal deficit present.     Mental Status: She is alert.  Psychiatric:        Mood and Affect: Mood normal.      UC Treatments / Results  Labs (all labs ordered are listed, but only abnormal results are displayed) Labs Reviewed - No data to display  EKG   Radiology No results found.  Procedures Procedures (including critical care time)  Medications Ordered in  UC Medications  cloNIDine (CATAPRES) tablet 0.1 mg (0.1 mg Oral Given 08/04/20 1456)    Initial Impression / Assessment and Plan / UC Course  I have reviewed the triage vital signs and the nursing notes.  Pertinent labs & imaging results that were available during my care of the patient were reviewed by me and considered in my medical decision making (see chart for details).     MDM:  Pt given clonidine 1mg . Pt observed for extended period  Bp decreased.  Pt counseled on diet and follow up with her MD.  Final Clinical Impressions(s) / UC Diagnoses   Final diagnoses:  None     Discharge Instructions     Avoid salt/sodium. Take your blood pressure medication as directed    ED Prescriptions    None     PDMP not reviewed this encounter.  An After Visit Summary was printed and given to the patient.    Fransico Meadow, [place] 08/06/20 [id number]

## 2020-08-20 ENCOUNTER — Ambulatory Visit (INDEPENDENT_AMBULATORY_CARE_PROVIDER_SITE_OTHER): Payer: Medicare Other

## 2020-08-20 DIAGNOSIS — I679 Cerebrovascular disease, unspecified: Secondary | ICD-10-CM | POA: Diagnosis not present

## 2020-08-23 LAB — CUP PACEART REMOTE DEVICE CHECK
Battery Remaining Longevity: 73 mo
Battery Voltage: 3.01 V
Brady Statistic AP VP Percent: 0.01 %
Brady Statistic AP VS Percent: 28 %
Brady Statistic AS VP Percent: 0.03 %
Brady Statistic AS VS Percent: 71.96 %
Brady Statistic RA Percent Paced: 27.99 %
Brady Statistic RV Percent Paced: 0.04 %
Date Time Interrogation Session: 20220311131426
Implantable Lead Implant Date: 20170320
Implantable Lead Implant Date: 20170320
Implantable Lead Location: 753859
Implantable Lead Location: 753860
Implantable Lead Model: 5076
Implantable Lead Model: 5076
Implantable Pulse Generator Implant Date: 20170320
Lead Channel Impedance Value: 323 Ohm
Lead Channel Impedance Value: 418 Ohm
Lead Channel Impedance Value: 456 Ohm
Lead Channel Impedance Value: 532 Ohm
Lead Channel Pacing Threshold Amplitude: 0.75 V
Lead Channel Pacing Threshold Amplitude: 0.875 V
Lead Channel Pacing Threshold Pulse Width: 0.4 ms
Lead Channel Pacing Threshold Pulse Width: 0.4 ms
Lead Channel Sensing Intrinsic Amplitude: 2.125 mV
Lead Channel Sensing Intrinsic Amplitude: 2.125 mV
Lead Channel Sensing Intrinsic Amplitude: 20.125 mV
Lead Channel Sensing Intrinsic Amplitude: 20.125 mV
Lead Channel Setting Pacing Amplitude: 1.75 V
Lead Channel Setting Pacing Amplitude: 2 V
Lead Channel Setting Pacing Pulse Width: 0.4 ms
Lead Channel Setting Sensing Sensitivity: 2.8 mV

## 2020-08-30 NOTE — Progress Notes (Signed)
Remote pacemaker transmission.   

## 2020-11-05 ENCOUNTER — Encounter: Payer: Self-pay | Admitting: Emergency Medicine

## 2020-11-05 ENCOUNTER — Other Ambulatory Visit: Payer: Self-pay

## 2020-11-05 ENCOUNTER — Ambulatory Visit
Admission: EM | Admit: 2020-11-05 | Discharge: 2020-11-05 | Disposition: A | Discharge status: Home / Self Care | Payer: Medicare Other | Attending: Emergency Medicine | Admitting: Emergency Medicine

## 2020-11-05 DIAGNOSIS — W19XXXA Unspecified fall, initial encounter: Secondary | ICD-10-CM

## 2020-11-05 DIAGNOSIS — M25512 Pain in left shoulder: Secondary | ICD-10-CM | POA: Diagnosis not present

## 2020-11-05 DIAGNOSIS — M79661 Pain in right lower leg: Secondary | ICD-10-CM

## 2020-11-05 MED ORDER — PREDNISONE 20 MG PO TABS: 20.0000 mg | ORAL_TABLET | Freq: Every day | ORAL | 0 refills | Status: AC

## 2020-11-05 NOTE — ED Provider Notes (Signed)
[facility]   [id number] 11/05/20 Arrival Time: 1820  CC: Fall  SUBJECTIVE: History from: patient. DAILYNN Jodi Peters is a 87 y.o. female complains of RT LE and LT shoulder pain x today.  Had fall.  Denies head trauma or LOC.  Localizes the pain to the front of shoulder and inside of LT LE.  Describes the pain as intermittent.  Has tried OTC medications with relief.  Symptoms are made worse with walking.  Denies similar symptoms in the past.  Denies fever, chills, erythema, ecchymosis, effusion, weakness, numbness and tingling.   ROS: As per HPI.  All other pertinent ROS negative.     Past Medical History:  Diagnosis Date  . Allergic rhinitis   . Anxiety   . Bradycardia   . Breast cancer ([place])   . Breast mass    left  . Breast pain    leftr  . Carotid atherosclerosis   . Carpal tunnel syndrome   . Cataract   . Constipation   . Cystocele 06/30/2014  . Glaucoma   . Hyperlipidemia   . Hypertension   . IBS (irritable bowel syndrome)   . Low back pain   . Mitral regurgitation    sinus bradycardia  . Osteoarthritis   . Osteopenia   . Rectocele 06/30/2014  . Seborrheic keratosis   . Venous insufficiency   . Vertigo   . Vitamin D deficiency    Past Surgical History:  Procedure Laterality Date  . ABDOMINAL HYSTERECTOMY    . APPENDECTOMY    . BACK SURGERY    . BREAST LUMPECTOMY    . CHOLECYSTECTOMY    . cyst removed from left breast    . EP IMPLANTABLE DEVICE N/A 08/30/2015   Procedure: Pacemaker Implant;  Surgeon: Evans Lance, MD;  Location: [facility];  Service: Cardiovascular;  Laterality: N/A;  . EYE SURGERY     for bleed in left eye  . keloid     left ear  . LUMBAR LAMINECTOMY/DECOMPRESSION MICRODISCECTOMY N/A 12/25/2014   Procedure: Thoracic eleven-twelve Laminectomy/Diskectomy; Lumbar three-four Laminectomy/Diskectomy;  Surgeon: Kristeen Miss, MD;  Location: [facility];  Service: Neurosurgery;  Laterality: N/A;  . spurs on back     Allergies   Allergen Reactions  . Penicillins Rash    Has patient had a PCN reaction causing immediate rash, facial/tongue/throat swelling, SOB or lightheadedness with hypotension: Yes Has patient had a PCN reaction causing severe rash involving mucus membranes or skin necrosis: No Has patient had a PCN reaction that required hospitalization No Has patient had a PCN reaction occurring within the last 10 years: Yes If all of the above answers are "NO", then may proceed with Cephalosporin use.   No current facility-administered medications on file prior to encounter.   Current Outpatient Medications on File Prior to Encounter  Medication Sig Dispense Refill  . acetaminophen (TYLENOL) 500 MG tablet Take 500 mg by mouth every 6 (six) hours as needed for mild pain or moderate pain.    Marland Kitchen betamethasone dipropionate 0.05 % cream APPLY TO THE AFFECTEDVAREA(S) TWICE DAILY ASDNEEDED. (NOT TO FACE, GROIN, UNDER ARMS.)    . brimonidine (ALPHAGAN) 0.2 % ophthalmic solution Place 1 drop into both eyes 2 (two) times daily.    . carvedilol (COREG) 3.125 MG tablet Take 1 tablet (3.125 mg total) by mouth 2 (two) times daily. 180 tablet 3  . Cholecalciferol (VITAMIN D3) 5000 units CAPS Take 1 capsule by mouth daily.    . clopidogrel (PLAVIX)  75 MG tablet Take 1 tablet (75 mg total) by mouth daily. 30 tablet 3  . dorzolamide-timolol (COSOPT) 22.3-6.8 MG/ML ophthalmic solution Place 1 drop into both eyes 2 (two) times daily.     . furosemide (LASIX) 20 MG tablet Take 20 mg by mouth every morning.    . latanoprost (XALATAN) 0.005 % ophthalmic solution Place 1 drop into both eyes at bedtime.    Marland Kitchen lisinopril (PRINIVIL,ZESTRIL) 40 MG tablet Take 40 mg by mouth daily.     . mirtazapine (REMERON) 15 MG tablet Take 15 mg by mouth at bedtime.    . pilocarpine (PILOCAR) 4 % ophthalmic solution Place 1 drop into both eyes 4 (four) times daily.     [name] Glycol-Propyl Glycol (SYSTANE OP) Apply 1 drop to eye as needed (for dry  eye relief).     . Simethicone (GAS-X PO) Take 1 tablet by mouth daily as needed (for gas relief).     . simvastatin (ZOCOR) 20 MG tablet Take 20 mg by mouth daily at 6 PM.      Social History   Socioeconomic History  . Marital status: Divorced    Spouse name: Not on file  . Number of children: Not on file  . Years of education: Not on file  . Highest education level: Not on file  Occupational History  . Not on file  Tobacco Use  . Smoking status: Never Smoker  . Smokeless tobacco: Never Used  Vaping Use  . Vaping Use: Never used  Substance and Sexual Activity  . Alcohol use: No    Alcohol/week: 0.0 standard drinks  . Drug use: No  . Sexual activity: Never    Birth control/protection: Surgical    Comment: hyst  Other Topics Concern  . Not on file  Social History Narrative  . Not on file   Social Determinants of Health   Financial Resource Strain: Not on file  Food Insecurity: Not on file  Transportation Needs: Not on file  Physical Activity: Not on file  Stress: Not on file  Social Connections: Not on file  Intimate Partner Violence: Not on file   Family History  Problem Relation Age of Onset  . Heart disease Mother   . Hypertension Mother   . Diabetes Mother   . Other Daughter        left breast nodule  . Hypertension Son   . Hyperlipidemia Son   . Heart disease Maternal Grandmother   . Hypertension Maternal Grandmother   . Hypertension Son   . Other Son        boils    OBJECTIVE:  Vitals:   11/05/20 1932  BP: (!) 150/73  Pulse: 62  Resp: 18  Temp: (!) 97.5 F (36.4 C)  TempSrc: Oral  SpO2: 98%    General appearance: ALERT; in no acute distress.  Head: NCAT Lungs: Normal respiratory effort Musculoskeletal: LT shoulder; RT leg Inspection: Skin warm, dry, clear and intact without obvious erythema, effusion, or ecchymosis.  Palpation: diffusely TTP medial aspect of LT LE and anterior shoulder  ROM: FROM active and passive Strength: 5/5 shld  abduction, 5/5 shld adduction, 5/5 elbow flexion, 5/5 elbow extension, 5/5 grip strength, 5/5 knee flexion, 5/5 knee extension, 5/5 dorsiflexion, 5/5 plantar flexion Skin: warm and dry Neurologic: Sitting in walker; Sensation intact about the upper/ lower extremities Psychological: alert and cooperative; normal mood and affect  ASSESSMENT & PLAN:  1. Acute pain of left shoulder   2. Pain of right  lower leg   3. Fall, initial encounter     Meds ordered this encounter  Medications  . predniSONE (DELTASONE) 20 MG tablet    Sig: Take 1 tablet (20 mg total) by mouth daily with breakfast for 5 days.    Dispense:  5 tablet    Refill:  0    Order Specific Question:   Supervising Provider    Answer:   Raylene Everts [[id number]]   Offered x-ray. Declines at this  Continue conservative management of rest, ice, elevation, and gentle stretches Prednisone prescribed.  Take as directed and to completion Follow up with orthopedist Return or go to the ER if you have any new or worsening symptoms (fever, chills, chest pain, redness, swelling, deformity, etc...)   Reviewed expectations re: course of current medical issues. Questions answered. Outlined signs and symptoms indicating need for more acute intervention. Patient verbalized understanding. After Visit Summary given.    Lestine Box, PA-C 11/05/20 2009

## 2020-11-05 NOTE — ED Triage Notes (Addendum)
Pt lost her balance today around  1230 pm and fell today.  States she caught herself and did not hit her head.  C/o LT shoulder pain earlier but feels better since she took some tylenol.  Also c/o  pain to lower RT leg.  Pt has been able to walk since the fall.  Reports feeling weak and nervous since the fall, states it scared her.  Pt has medication for anxiety but did not take any.

## 2020-11-05 NOTE — Discharge Instructions (Signed)
Continue conservative management of rest, ice, elevation, and gentle stretches Prednisone prescribed.  Take as directed and to completion Follow up with orthopedist Return or go to the ER if you have any new or worsening symptoms (fever, chills, chest pain, redness, swelling, deformity, etc...)

## 2020-11-10 ENCOUNTER — Emergency Department (HOSPITAL_COMMUNITY): Payer: Medicare Other

## 2020-11-10 ENCOUNTER — Other Ambulatory Visit: Payer: Self-pay

## 2020-11-10 ENCOUNTER — Ambulatory Visit
Admission: EM | Admit: 2020-11-10 | Discharge: 2020-11-10 | Disposition: A | Discharge status: Home / Self Care | Payer: Medicare Other | Attending: Family Medicine | Admitting: Family Medicine

## 2020-11-10 ENCOUNTER — Encounter (HOSPITAL_COMMUNITY): Payer: Self-pay

## 2020-11-10 ENCOUNTER — Emergency Department (HOSPITAL_COMMUNITY)
Admission: EM | Admit: 2020-11-10 | Discharge: 2020-11-10 | Disposition: A | Discharge status: Home / Self Care | Payer: Medicare Other | Attending: Emergency Medicine | Admitting: Emergency Medicine

## 2020-11-10 DIAGNOSIS — R350 Frequency of micturition: Secondary | ICD-10-CM | POA: Insufficient documentation

## 2020-11-10 DIAGNOSIS — I10 Essential (primary) hypertension: Secondary | ICD-10-CM | POA: Insufficient documentation

## 2020-11-10 DIAGNOSIS — R531 Weakness: Secondary | ICD-10-CM

## 2020-11-10 DIAGNOSIS — R5383 Other fatigue: Secondary | ICD-10-CM

## 2020-11-10 DIAGNOSIS — H538 Other visual disturbances: Secondary | ICD-10-CM

## 2020-11-10 DIAGNOSIS — R42 Dizziness and giddiness: Secondary | ICD-10-CM

## 2020-11-10 DIAGNOSIS — Z20822 Contact with and (suspected) exposure to covid-19: Secondary | ICD-10-CM | POA: Insufficient documentation

## 2020-11-10 DIAGNOSIS — Z7902 Long term (current) use of antithrombotics/antiplatelets: Secondary | ICD-10-CM | POA: Insufficient documentation

## 2020-11-10 DIAGNOSIS — Z8673 Personal history of transient ischemic attack (TIA), and cerebral infarction without residual deficits: Secondary | ICD-10-CM | POA: Diagnosis not present

## 2020-11-10 DIAGNOSIS — Z79899 Other long term (current) drug therapy: Secondary | ICD-10-CM | POA: Diagnosis not present

## 2020-11-10 DIAGNOSIS — Z853 Personal history of malignant neoplasm of breast: Secondary | ICD-10-CM | POA: Diagnosis not present

## 2020-11-10 LAB — CBC
HCT: 37.4 % (ref 36.0–46.0)
Hemoglobin: 11.9 g/dL — ABNORMAL LOW (ref 12.0–15.0)
MCH: 29.9 pg (ref 26.0–34.0)
MCHC: 31.8 g/dL (ref 30.0–36.0)
MCV: 94 fL (ref 80.0–100.0)
Platelets: 278 10*3/uL (ref 150–400)
RBC: 3.98 MIL/uL (ref 3.87–5.11)
RDW: 14.8 % (ref 11.5–15.5)
WBC: 8.3 10*3/uL (ref 4.0–10.5)
nRBC: 0 % (ref 0.0–0.2)

## 2020-11-10 LAB — POCT URINALYSIS DIP (MANUAL ENTRY)
Bilirubin, UA: NEGATIVE
Blood, UA: NEGATIVE
Glucose, UA: NEGATIVE mg/dL
Ketones, POC UA: NEGATIVE mg/dL
Leukocytes, UA: NEGATIVE
Nitrite, UA: NEGATIVE
Protein Ur, POC: NEGATIVE mg/dL
Spec Grav, UA: 1.02 (ref 1.010–1.025)
Urobilinogen, UA: 0.2 E.U./dL
pH, UA: 5 (ref 5.0–8.0)

## 2020-11-10 LAB — URINALYSIS, ROUTINE W REFLEX MICROSCOPIC
Bilirubin Urine: NEGATIVE
Glucose, UA: NEGATIVE mg/dL
Hgb urine dipstick: NEGATIVE
Ketones, ur: NEGATIVE mg/dL
Leukocytes,Ua: NEGATIVE
Nitrite: NEGATIVE
Protein, ur: NEGATIVE mg/dL
Specific Gravity, Urine: 1.006 (ref 1.005–1.030)
pH: 6 (ref 5.0–8.0)

## 2020-11-10 LAB — COMPREHENSIVE METABOLIC PANEL
ALT: 14 U/L (ref 0–44)
AST: 16 U/L (ref 15–41)
Albumin: 4.3 g/dL (ref 3.5–5.0)
Alkaline Phosphatase: 56 U/L (ref 38–126)
Anion gap: 7 (ref 5–15)
BUN: 45 mg/dL — ABNORMAL HIGH (ref 8–23)
CO2: 23 mmol/L (ref 22–32)
Calcium: 10.6 mg/dL — ABNORMAL HIGH (ref 8.9–10.3)
Chloride: 106 mmol/L (ref 98–111)
Creatinine, Ser: 1.29 mg/dL — ABNORMAL HIGH (ref 0.44–1.00)
GFR, Estimated: 40 mL/min — ABNORMAL LOW (ref >60)
Glucose, Bld: 114 mg/dL — ABNORMAL HIGH (ref 70–99)
Potassium: 5 mmol/L (ref 3.5–5.1)
Sodium: 136 mmol/L (ref 135–145)
Total Bilirubin: 0.5 mg/dL (ref 0.3–1.2)
Total Protein: 7.2 g/dL (ref 6.5–8.1)

## 2020-11-10 LAB — RESP PANEL BY RT-PCR (FLU A&B, COVID) ARPGX2
Influenza A by PCR: NEGATIVE
Influenza B by PCR: NEGATIVE
SARS Coronavirus 2 by RT PCR: NEGATIVE

## 2020-11-10 MED ORDER — HYDRALAZINE HCL 25 MG PO TABS
50.0000 mg | ORAL_TABLET | Freq: Once | ORAL | Status: AC
  Administered 2020-11-10: 50 mg via ORAL
  Filled 2020-11-10: qty 2

## 2020-11-10 NOTE — ED Provider Notes (Signed)
[facility]    CSN: [id number] Arrival date & time: 11/10/20  1453      History   Chief Complaint Chief Complaint  Patient presents with  . Weakness  . Dizziness    HPI Jodi Peters is a 87 y.o. female.   Reports that she is feeling weak, dizzy, fatigued, and having blurred vision since this morning. Reports increased urinary frequency as well. Denies previous symptoms. There are not aggravating or alleviating factors. Of note, she was seen in this office after a fall on 11/05/20, where she denied any head injury. Has medical hx of HTN, CVA, presence of pacemaker, HLD, mitral regurgitation, venous insufficiency and more. Denies current nausea, vomiting, diarrhea, rash, fever, chest pain, back pain, abdominal pain, other symptoms.  ROS per HPI  The history is provided by the patient and a relative.  Weakness Associated symptoms: dizziness   Dizziness Associated symptoms: weakness     Past Medical History:  Diagnosis Date  . Allergic rhinitis   . Anxiety   . Bradycardia   . Breast cancer ([place])   . Breast mass    left  . Breast pain    leftr  . Carotid atherosclerosis   . Carpal tunnel syndrome   . Cataract   . Constipation   . Cystocele 06/30/2014  . Glaucoma   . Hyperlipidemia   . Hypertension   . IBS (irritable bowel syndrome)   . Low back pain   . Mitral regurgitation    sinus bradycardia  . Osteoarthritis   . Osteopenia   . Rectocele 06/30/2014  . Seborrheic keratosis   . Venous insufficiency   . Vertigo   . Vitamin D deficiency     Patient Active Problem List   Diagnosis Date Noted  . Sinus node dysfunction ([place]) 08/30/2015  . Near syncope 06/21/2015  . Pain in joint, shoulder region 02/05/2015  . TIA (transient ischemic attack) 01/26/2015  . Leukocytosis 01/18/2015  . UTI (urinary tract infection) 12/29/2014  . Spinal stenosis of thoracic region   . Spinal stenosis 12/22/2014  . Spinal cord compression ([place]) 12/22/2014  . Essential  hypertension 12/22/2014  . Spinal stenosis of lumbar region   . Rectocele 06/30/2014  . Cystocele 06/30/2014  . HERPES ZOSTER 01/14/2010  . MITRAL REGURGITATION 01/14/2010  . Cerebrovascular disease 01/14/2010  . INSOMNIA 12/23/2008  . OSTEOPENIA 06/01/2008  . BRADYCARDIA 01/27/2008  . INSUFFICIENCY, VENOUS NOS 01/10/2007  . ALLERGIC RHINITIS, SEASONAL 10/10/2006  . SEBORRHEIC KERATOSIS 08/08/2006  . HYPERLIPIDEMIA 05/23/2006  . Anxiety state 05/23/2006  . CARPAL TUNNEL SYNDROME 05/23/2006  . Glaucoma 05/23/2006  . CATARACT NOS 05/23/2006  . HYPERTENSION 05/23/2006  . CONSTIPATION 05/23/2006  . IBS 05/23/2006  . OSTEOARTHRITIS 05/23/2006  . LOW BACK PAIN 05/23/2006  . BREAST CANCER, HX OF 05/23/2006    Past Surgical History:  Procedure Laterality Date  . ABDOMINAL HYSTERECTOMY    . APPENDECTOMY    . BACK SURGERY    . BREAST LUMPECTOMY    . CHOLECYSTECTOMY    . cyst removed from left breast    . EP IMPLANTABLE DEVICE N/A 08/30/2015   Procedure: Pacemaker Implant;  Surgeon: Evans Lance, MD;  Location: [facility];  Service: Cardiovascular;  Laterality: N/A;  . EYE SURGERY     for bleed in left eye  . keloid     left ear  . LUMBAR LAMINECTOMY/DECOMPRESSION MICRODISCECTOMY N/A 12/25/2014   Procedure: Thoracic eleven-twelve Laminectomy/Diskectomy; Lumbar three-four Laminectomy/Diskectomy;  Surgeon: Kristeen Miss,  MD;  Location: [facility];  Service: Neurosurgery;  Laterality: N/A;  . spurs on back      OB History    Gravida  5   Para  5   Term      Preterm      AB      Living  5     SAB      IAB      Ectopic      Multiple      Live Births               Home Medications    Prior to Admission medications   Medication Sig Start Date End Date Taking? Authorizing Provider  acetaminophen (TYLENOL) 500 MG tablet Take 500 mg by mouth every 6 (six) hours as needed for mild pain or moderate pain.    [provider]  betamethasone  dipropionate 0.05 % cream APPLY TO THE AFFECTEDVAREA(S) TWICE DAILY ASDNEEDED. (NOT TO FACE, GROIN, UNDER ARMS.) 10/09/19   [provider]  brimonidine (ALPHAGAN) 0.2 % ophthalmic solution Place 1 drop into both eyes 2 (two) times daily. 05/13/15   [provider]  carvedilol (COREG) 3.125 MG tablet Take 1 tablet (3.125 mg total) by mouth 2 (two) times daily. 12/04/19 03/03/20  Evans Lance, MD  Cholecalciferol (VITAMIN D3) 5000 units CAPS Take 1 capsule by mouth daily.    [provider]  clopidogrel (PLAVIX) 75 MG tablet Take 1 tablet (75 mg total) by mouth daily. 06/23/15   Rosita Fire, MD  dorzolamide-timolol (COSOPT) 22.3-6.8 MG/ML ophthalmic solution Place 1 drop into both eyes 2 (two) times daily.     [provider]  furosemide (LASIX) 20 MG tablet Take 20 mg by mouth every morning. 11/25/15   [provider]  latanoprost (XALATAN) 0.005 % ophthalmic solution Place 1 drop into both eyes at bedtime.    [provider]  lisinopril (PRINIVIL,ZESTRIL) 40 MG tablet Take 40 mg by mouth daily.  11/25/15   [provider]  mirtazapine (REMERON) 15 MG tablet Take 15 mg by mouth at bedtime.    [provider]  pilocarpine (PILOCAR) 4 % ophthalmic solution Place 1 drop into both eyes 4 (four) times daily.  02/17/14   [provider]  Polyethyl Glycol-Propyl Glycol (SYSTANE OP) Apply 1 drop to eye as needed (for dry eye relief).     [provider]  predniSONE (DELTASONE) 20 MG tablet Take 1 tablet (20 mg total) by mouth daily with breakfast for 5 days. 11/05/20 11/10/20  Wurst, Marye Round, PA-C  Simethicone (GAS-X PO) Take 1 tablet by mouth daily as needed (for gas relief).     [provider]  simvastatin (ZOCOR) 20 MG tablet Take 20 mg by mouth daily at 6 PM.  07/13/15   [provider]    Family History Family History  Problem Relation Age of Onset  . Heart disease Mother   . Hypertension Mother    . Diabetes Mother   . Other Daughter        left breast nodule  . Hypertension Son   . Hyperlipidemia Son   . Heart disease Maternal Grandmother   . Hypertension Maternal Grandmother   . Hypertension Son   . Other Son        boils    Social History Social History   Tobacco Use  . Smoking status: Never Smoker  . Smokeless tobacco: Never Used  Vaping Use  . Vaping Use: Never  used  Substance Use Topics  . Alcohol use: No    Alcohol/week: 0.0 standard drinks  . Drug use: No     Allergies   Penicillins   Review of Systems Review of Systems  Neurological: Positive for dizziness and weakness.     Physical Exam Triage Vital Signs ED Triage Vitals  Enc Vitals Group     BP 11/10/20 1505 (!) 187/69     Pulse Rate 11/10/20 1505 68     Resp 11/10/20 1505 18     Temp 11/10/20 1505 98.9 F (37.2 C)     Temp src --      SpO2 11/10/20 1505 96 %     Weight --      Height --      Head Circumference --      Peak Flow --      Pain Score 11/10/20 1503 0     Pain Loc --      Pain Edu? --      Excl. in [place]? --    No data found.  Updated Vital Signs BP (!) 187/69   Pulse 68   Temp 98.9 F (37.2 C)   Resp 18   SpO2 96%       Physical Exam Vitals and nursing note reviewed.  Constitutional:      General: She is not in acute distress.    Appearance: She is well-developed. She is ill-appearing.  HENT:     Head: Normocephalic and atraumatic.     Nose: Nose normal.     Mouth/Throat:     Mouth: Mucous membranes are moist.     Pharynx: Oropharynx is clear.  Cardiovascular:     Rate and Rhythm: Normal rate and regular rhythm.  Pulmonary:     Effort: Pulmonary effort is normal. No respiratory distress.  Musculoskeletal:     Cervical back: Normal range of motion and neck supple.  Skin:    General: Skin is warm and dry.     Capillary Refill: Capillary refill takes less than 2 seconds.  Neurological:     General: No focal deficit present.     Mental Status: She is  alert and oriented to person, place, and time. Mental status is at baseline.  Psychiatric:        Mood and Affect: Mood normal.        Behavior: Behavior normal.        Thought Content: Thought content normal.      UC Treatments / Results  Labs (all labs ordered are listed, but only abnormal results are displayed) Labs Reviewed  POCT URINALYSIS DIP (MANUAL ENTRY)    EKG   Radiology No results found.  Procedures Procedures (including critical care time)  Medications Ordered in UC Medications - No data to display  Initial Impression / Assessment and Plan / UC Course  I have reviewed the triage vital signs and the nursing notes.  Pertinent labs & imaging results that were available during my care of the patient were reviewed by me and considered in my medical decision making (see chart for details).    Dizziness Weakness Fatigue Blurred Vision Hypertension  UA negative in office  EKG shows atrial paced rhythm Discussed with family and patient that given her increased blood pressure, weakness, dizziness, blurred vision and fatigue that they would be best served in the ER Cannot rule out acute CVA, TIA in office today Verbalized understanding and in agreement with treatment plan To ER via POV with  son  Final Clinical Impressions(s) / UC Diagnoses   Final diagnoses:  Other fatigue  Essential hypertension  Weakness  Dizziness and giddiness  Blurred vision   Discharge Instructions   None    ED Prescriptions    None     PDMP not reviewed this encounter.   Faustino Congress, NP 11/10/20 1558

## 2020-11-10 NOTE — ED Notes (Signed)
Pt crying in room wanting to leave. EDP notified and talked to pt about risks of going home and that everything has not resulted ye. Pt understands but wants to leave anyways. EDP putting pt up for discharge at this time. Pt son called.

## 2020-11-10 NOTE — Discharge Instructions (Addendum)
Cup was not complete.  Understand you want to go home.  Make an appointment follow-up with your doctor return for any new or worse symptoms.  Head CT here today negative.  We were still waiting on basic metabolic panel.  As you know blood pressure was elevated.  But you had a long day and had not had your medicines.  Dose of hydralazine given right prior to discharge.

## 2020-11-10 NOTE — Discharge Instructions (Addendum)
Go to the ER for further evaluation and treatment. 

## 2020-11-10 NOTE — ED Triage Notes (Signed)
Pt to er, pt states that when she woke up this am she felt weak and dizzy, states that she also feels like she is looking through a fog.  Pt states that she thought that she had a uti because her back was hurting.

## 2020-11-10 NOTE — ED Triage Notes (Signed)
Pt presents with c/o feeling weak and dizzy, symptoms began this morning, also reports frequent urination

## 2020-11-10 NOTE — ED Provider Notes (Signed)
Emergency Medicine Provider Triage Evaluation Note  Jodi Peters , a 87 y.o. female  was evaluated in triage.  Pt complains of weakness that began when she woke up this morning.  Reports generalized weakness and dizziness.  She denies any chest pain, shortness of breath, sweats, chills, vomiting, diarrhea, cough or urinary symptoms.  She states that her bottom is sore.  Review of Systems  Positive: Weakness, dizziness, buttock pain Negative: Nvd, fevers, cough  Physical Exam  BP (!) 167/57 (BP Location: Right Arm)   Pulse 85   Temp 98.8 F (37.1 C) (Oral)   Resp 18   Ht 5' 4.5" (1.638 m)   Wt 53.1 kg   SpO2 97%   BMI 19.77 kg/m  Gen:   Awake, no distress   Resp:  Normal effort  MSK:   Moves extremities without difficulty  Other:  Clear speech  Medical Decision Making  Medically screening exam initiated at 5:56 PM.  Appropriate orders placed.  KIMBRA MARCELINO was informed that the remainder of the evaluation will be completed by another provider, this initial triage assessment does not replace that evaluation, and the importance of remaining in the ED until their evaluation is complete.    Rodney Booze, PA-C 11/10/20 1757    Fredia Sorrow, MD 11/10/20 2151

## 2020-11-11 NOTE — ED Provider Notes (Signed)
[facility] EMERGENCY DEPARTMENT Provider Note   CSN: [id number] Arrival date & time: 11/10/20  1605     History Chief Complaint  Patient presents with  . Dizziness    Jodi Peters is a 87 y.o. female.  Patient sent in from urgent care.  Patient this morning was feeling weak and dizzy.  And frequent urination.  Patient went to urgent care.  They were concerned about elevated blood pressure.  Blood pressure 196/65.  Patient states that she is on hydralazine.  50 mg 3 times a day.  Patient has a pacemaker.  Urgent care I think was concerned about the elevated blood pressure and the dizziness may be representing possible stroke.  Patient states although symptoms have resolved.  And she does have some soreness in her buttocks area where she has has small wound.  That her doctor is working on.  Patient denies headache chest pain any visual problems any weakness to her lower extremities upper extremities.  Urinalysis at the urgent care was normal.  Patient denied any true room spinning        Past Medical History:  Diagnosis Date  . Allergic rhinitis   . Anxiety   . Bradycardia   . Breast cancer ([place])   . Breast mass    left  . Breast pain    leftr  . Carotid atherosclerosis   . Carpal tunnel syndrome   . Cataract   . Constipation   . Cystocele 06/30/2014  . Glaucoma   . Hyperlipidemia   . Hypertension   . IBS (irritable bowel syndrome)   . Low back pain   . Mitral regurgitation    sinus bradycardia  . Osteoarthritis   . Osteopenia   . Rectocele 06/30/2014  . Seborrheic keratosis   . Venous insufficiency   . Vertigo   . Vitamin D deficiency     Patient Active Problem List   Diagnosis Date Noted  . Sinus node dysfunction ([place]) 08/30/2015  . Near syncope 06/21/2015  . Pain in joint, shoulder region 02/05/2015  . TIA (transient ischemic attack) 01/26/2015  . Leukocytosis 01/18/2015  . UTI (urinary tract infection) 12/29/2014  . Spinal stenosis of thoracic region   .  Spinal stenosis 12/22/2014  . Spinal cord compression ([place]) 12/22/2014  . Essential hypertension 12/22/2014  . Spinal stenosis of lumbar region   . Rectocele 06/30/2014  . Cystocele 06/30/2014  . HERPES ZOSTER 01/14/2010  . MITRAL REGURGITATION 01/14/2010  . Cerebrovascular disease 01/14/2010  . INSOMNIA 12/23/2008  . OSTEOPENIA 06/01/2008  . BRADYCARDIA 01/27/2008  . INSUFFICIENCY, VENOUS NOS 01/10/2007  . ALLERGIC RHINITIS, SEASONAL 10/10/2006  . SEBORRHEIC KERATOSIS 08/08/2006  . HYPERLIPIDEMIA 05/23/2006  . Anxiety state 05/23/2006  . CARPAL TUNNEL SYNDROME 05/23/2006  . Glaucoma 05/23/2006  . CATARACT NOS 05/23/2006  . HYPERTENSION 05/23/2006  . CONSTIPATION 05/23/2006  . IBS 05/23/2006  . OSTEOARTHRITIS 05/23/2006  . LOW BACK PAIN 05/23/2006  . BREAST CANCER, HX OF 05/23/2006    Past Surgical History:  Procedure Laterality Date  . ABDOMINAL HYSTERECTOMY    . APPENDECTOMY    . BACK SURGERY    . BREAST LUMPECTOMY    . CHOLECYSTECTOMY    . cyst removed from left breast    . EP IMPLANTABLE DEVICE N/A 08/30/2015   Procedure: Pacemaker Implant;  Surgeon: Evans Lance, MD;  Location: [facility];  Service: Cardiovascular;  Laterality: N/A;  . EYE SURGERY     for bleed in left eye  .  keloid     left ear  . LUMBAR LAMINECTOMY/DECOMPRESSION MICRODISCECTOMY N/A 12/25/2014   Procedure: Thoracic eleven-twelve Laminectomy/Diskectomy; Lumbar three-four Laminectomy/Diskectomy;  Surgeon: Kristeen Miss, MD;  Location: [facility];  Service: Neurosurgery;  Laterality: N/A;  . spurs on back       OB History    Gravida  5   Para  5   Term      Preterm      AB      Living  5     SAB      IAB      Ectopic      Multiple      Live Births              Family History  Problem Relation Age of Onset  . Heart disease Mother   . Hypertension Mother   . Diabetes Mother   . Other Daughter        left breast nodule  . Hypertension Son   . Hyperlipidemia  Son   . Heart disease Maternal Grandmother   . Hypertension Maternal Grandmother   . Hypertension Son   . Other Son        boils    Social History   Tobacco Use  . Smoking status: Never Smoker  . Smokeless tobacco: Never Used  Vaping Use  . Vaping Use: Never used  Substance Use Topics  . Alcohol use: No    Alcohol/week: 0.0 standard drinks  . Drug use: No    Home Medications Prior to Admission medications   Medication Sig Start Date End Date Taking? Authorizing Provider  brimonidine (ALPHAGAN) 0.2 % ophthalmic solution Place 1 drop into both eyes 2 (two) times daily. 05/13/15  Yes [provider]  carvedilol (COREG) 3.125 MG tablet Take 1 tablet (3.125 mg total) by mouth 2 (two) times daily. 12/04/19 03/03/20 Yes Evans Lance, MD  clopidogrel (PLAVIX) 75 MG tablet Take 1 tablet (75 mg total) by mouth daily. 06/23/15  Yes Rosita Fire, MD  hydrALAZINE (APRESOLINE) 50 MG tablet Take 50 mg by mouth 3 (three) times daily. 11/04/20  Yes [provider]  latanoprost (XALATAN) 0.005 % ophthalmic solution Place 1 drop into both eyes at bedtime.   Yes [provider]  mirtazapine (REMERON) 15 MG tablet Take 15 mg by mouth at bedtime.   Yes [provider]  Simethicone (GAS-X PO) Take 1 tablet by mouth daily as needed (for gas relief).    Yes [provider]  simvastatin (ZOCOR) 20 MG tablet Take 20 mg by mouth daily at 6 PM.  07/13/15  Yes [provider]  acetaminophen (TYLENOL) 500 MG tablet Take 500 mg by mouth every 6 (six) hours as needed for mild pain or moderate pain. Patient not taking: Reported on 11/10/2020    [provider]  betamethasone dipropionate 0.05 % cream APPLY TO THE AFFECTEDVAREA(S) TWICE DAILY ASDNEEDED. (NOT TO FACE, GROIN, UNDER ARMS.) Patient not taking: No sig reported 10/09/19   [provider]  Cholecalciferol (VITAMIN D3) 5000 units CAPS Take 1 capsule by mouth daily. Patient not taking:  Reported on 11/10/2020    [provider]  dorzolamide-timolol (COSOPT) 22.3-6.8 MG/ML ophthalmic solution Place 1 drop into both eyes 2 (two) times daily.  Patient not taking: Reported on 11/10/2020    [provider]  furosemide (LASIX) 20 MG tablet Take 20 mg by mouth every morning. Patient not taking: No sig reported 11/25/15   [provider]  gabapentin (NEURONTIN) 100 MG capsule Take 100 mg by mouth daily. Patient not taking: Reported on 11/10/2020 11/04/20   [provider]  lisinopril (PRINIVIL,ZESTRIL) 40 MG tablet Take 40 mg by mouth daily.  11/25/15   [provider]  pilocarpine (PILOCAR) 4 % ophthalmic solution Place 1 drop into both eyes 4 (four) times daily.  Patient not taking: Reported on 11/10/2020 02/17/14   [provider]  Polyethyl Glycol-Propyl Glycol (SYSTANE OP) Apply 1 drop to eye as needed (for dry eye relief).  Patient not taking: Reported on 11/10/2020    [provider]    Allergies    Penicillins  Review of Systems   Review of Systems  Constitutional: Positive for fatigue. Negative for chills and fever.  HENT: Negative for rhinorrhea and sore throat.   Eyes: Negative for visual disturbance.  Respiratory: Negative for cough and shortness of breath.   Cardiovascular: Negative for chest pain and leg swelling.  Gastrointestinal: Negative for abdominal pain, diarrhea, nausea and vomiting.  Genitourinary: Positive for frequency. Negative for dysuria.  Musculoskeletal: Negative for back pain and neck pain.  Skin: Negative for rash.  Neurological: Positive for dizziness. Negative for light-headedness and headaches.  Hematological: Does not bruise/bleed easily.  Psychiatric/Behavioral: Negative for confusion.    Physical Exam Updated Vital Signs BP (!) 203/71   Pulse (!) 59   Temp 98.4 F (36.9 C) (Oral)   Resp 18   Ht 1.638 m (5' 4.5")   Wt 53.1 kg   SpO2 100%   BMI 19.77 kg/m   Physical Exam Vitals  and nursing note reviewed.  Constitutional:      General: She is not in acute distress.    Appearance: She is well-developed. She is not ill-appearing or toxic-appearing.  HENT:     Head: Normocephalic and atraumatic.  Eyes:     Extraocular Movements: Extraocular movements intact.     Conjunctiva/sclera: Conjunctivae normal.     Pupils: Pupils are equal, round, and reactive to light.  Cardiovascular:     Rate and Rhythm: Normal rate and regular rhythm.     Heart sounds: No murmur heard.   Pulmonary:     Effort: Pulmonary effort is normal. No respiratory distress.     Breath sounds: Normal breath sounds. No wheezing or rales.  Abdominal:     Palpations: Abdomen is soft.     Tenderness: There is no abdominal tenderness.  Musculoskeletal:        General: No swelling.     Cervical back: Normal range of motion and neck supple.  Skin:    General: Skin is warm and dry.     Capillary Refill: Capillary refill takes 2 to 3 seconds.  Neurological:     General: No focal deficit present.     Mental Status: She is alert and oriented to person, place, and time.     Cranial Nerves: No cranial nerve deficit.     Sensory: No sensory deficit.     Motor: No weakness.     ED Results / Procedures / Treatments   Labs (all labs ordered are listed, but only abnormal results are displayed) Labs Reviewed  CBC - Abnormal; Notable for the following components:      Result Value   Hemoglobin 11.9 (*)    All other components within normal limits  COMPREHENSIVE METABOLIC PANEL - Abnormal; Notable for the following components:   Glucose, Bld 114 (*)    BUN 45 (*)    Creatinine, Ser 1.29 (*)  Calcium 10.6 (*)    GFR, Estimated 40 (*)    All other components within normal limits  URINALYSIS, ROUTINE W REFLEX MICROSCOPIC - Abnormal; Notable for the following components:   Color, Urine STRAW (*)    All other components within normal limits  RESP PANEL BY RT-PCR (FLU A&B, COVID) [id number]     EKG EKG Interpretation  Date/Time:  Wednesday November 10 2020 16:27:56 EDT Ventricular Rate:  75 PR Interval:  138 QRS Duration: 84 QT Interval:  344 QTC Calculation: 384 R Axis:   71 Text Interpretation: Sinus rhythm with marked sinus arrhythmia Otherwise normal ECG Confirmed by Fredia Sorrow [id number]) on 11/10/2020 4:34:52 PM   Radiology DG Chest 2 View  Result Date: 11/10/2020 CLINICAL DATA:  87 year old female with weakness. EXAM: CHEST - 2 VIEW COMPARISON:  Chest radiograph dated 08/31/2015. FINDINGS: No focal consolidation, pleural effusion, or pneumothorax. Stable cardiac silhouette. Left pectoral pacemaker device. Atherosclerotic calcification of the aorta. Degenerative changes of the spine. No acute osseous pathology. IMPRESSION: No active cardiopulmonary disease. Electronically Signed   By: Anner Crete M.D.   On: 11/10/2020 18:52   CT Head Wo Contrast  Result Date: 11/10/2020 CLINICAL DATA:  Head trauma.  History breast cancer. EXAM: CT HEAD WITHOUT CONTRAST TECHNIQUE: Contiguous axial images were obtained from the base of the skull through the vertex without intravenous contrast. COMPARISON:  CT head 01/26/2015 FINDINGS: Brain: Ventricle size normal. Patchy white matter hypodensity bilaterally unchanged from the prior study. Negative for acute infarct, hemorrhage, or mass lesion. Vascular: Negative for hyperdense vessel Skull: No focal skeletal lesion. Hyperostosis frontalis bilaterally. Sinuses/Orbits: Paranasal sinuses clear. Bilateral cataract extraction. Other: None IMPRESSION: Chronic white matter changes likely due to chronic microvascular ischemia. No acute abnormality. Electronically Signed   By: Franchot Gallo M.D.   On: 11/10/2020 19:00    Procedures Procedures   Medications Ordered in ED Medications  hydrALAZINE (APRESOLINE) tablet 50 mg (50 mg Oral Given 11/10/20 2216)    ED Course  I have reviewed the triage vital signs and the nursing notes.  Pertinent  labs & imaging results that were available during my care of the patient were reviewed by me and considered in my medical decision making (see chart for details).    MDM Rules/Calculators/A&P                          Patient insisted on leaving before we had her basic metabolic panel back and before we had her urine results.  Patient had been here for a long period of time and plus having been in urgent care can understand her concerns.  Tried to talk her into staying.  But she insisted on going and probably should follow-up with her doctor.  CT head without any acute findings.  Urinalysis here negative like it was at urgent care.  Complete metabolic panel got done although we ordered a basic metabolic panel.  The BUN and creatinine was 45 and 1.29 for a GFR 40 liver function tests were normal.  COVID testing negative.  CBC hemoglobin 11.9 white blood cell 8.3.  No significant findings.  Since patient has dizziness had completely resolved possible TIA.  But unlikely that it was a stroke but not specifically proven otherwise.  Patient would need MRI for that.  Patient was not can have anything to do with that.  Patient will follow-up with her doctor.  Her doctor is working on her blood pressure.  Patient was given  a dose of hydralazine here but then she insisted on leaving. Final Clinical Impression(s) / ED Diagnoses Final diagnoses:  Dizziness  Primary hypertension    Rx / DC Orders ED Discharge Orders    None       Fredia Sorrow, MD 11/11/20 [phone]

## 2020-11-19 ENCOUNTER — Ambulatory Visit (INDEPENDENT_AMBULATORY_CARE_PROVIDER_SITE_OTHER): Payer: Medicare Other

## 2020-11-19 DIAGNOSIS — I495 Sick sinus syndrome: Secondary | ICD-10-CM | POA: Diagnosis not present

## 2020-11-19 LAB — CUP PACEART REMOTE DEVICE CHECK
Battery Remaining Longevity: 67 mo
Battery Voltage: 3 V
Brady Statistic AP VP Percent: 0.02 %
Brady Statistic AP VS Percent: 43.52 %
Brady Statistic AS VP Percent: 0.02 %
Brady Statistic AS VS Percent: 56.44 %
Brady Statistic RA Percent Paced: 43.5 %
Brady Statistic RV Percent Paced: 0.05 %
Date Time Interrogation Session: 20220610125941
Implantable Lead Implant Date: 20170320
Implantable Lead Implant Date: 20170320
Implantable Lead Location: 753859
Implantable Lead Location: 753860
Implantable Lead Model: 5076
Implantable Lead Model: 5076
Implantable Pulse Generator Implant Date: 20170320
Lead Channel Impedance Value: 323 Ohm
Lead Channel Impedance Value: 437 Ohm
Lead Channel Impedance Value: 456 Ohm
Lead Channel Impedance Value: 551 Ohm
Lead Channel Pacing Threshold Amplitude: 0.875 V
Lead Channel Pacing Threshold Amplitude: 0.875 V
Lead Channel Pacing Threshold Pulse Width: 0.4 ms
Lead Channel Pacing Threshold Pulse Width: 0.4 ms
Lead Channel Sensing Intrinsic Amplitude: 1.875 mV
Lead Channel Sensing Intrinsic Amplitude: 1.875 mV
Lead Channel Sensing Intrinsic Amplitude: 18.25 mV
Lead Channel Sensing Intrinsic Amplitude: 18.25 mV
Lead Channel Setting Pacing Amplitude: 1.75 V
Lead Channel Setting Pacing Amplitude: 2 V
Lead Channel Setting Pacing Pulse Width: 0.4 ms
Lead Channel Setting Sensing Sensitivity: 2.8 mV

## 2020-11-30 ENCOUNTER — Ambulatory Visit (INDEPENDENT_AMBULATORY_CARE_PROVIDER_SITE_OTHER): Payer: Medicare Other | Admitting: Internal Medicine

## 2020-11-30 ENCOUNTER — Encounter: Payer: Self-pay | Admitting: Internal Medicine

## 2020-11-30 ENCOUNTER — Other Ambulatory Visit: Payer: Self-pay

## 2020-11-30 VITALS — BP 124/82 | HR 62 | Ht 64.5 in | Wt 117.0 lb

## 2020-11-30 DIAGNOSIS — I495 Sick sinus syndrome: Secondary | ICD-10-CM

## 2020-11-30 NOTE — Progress Notes (Signed)
HPI Jodi Peters returns today for followup of HTN and sinus node dysfunction. She is a pleasant 87 yo woman with a h/o HTN, snd s/p PPM and chronic diastolic heart failure. She feels weaker. She notes very mild peripheral edema. She has not had angina. She denies chest pain or sob. her energy level is down.  Allergies  Allergen Reactions   Penicillins Rash    Has patient had a PCN reaction causing immediate rash, facial/tongue/throat swelling, SOB or lightheadedness with hypotension: Yes Has patient had a PCN reaction causing severe rash involving mucus membranes or skin necrosis: No Has patient had a PCN reaction that required hospitalization No Has patient had a PCN reaction occurring within the last 10 years: Yes If all of the above answers are "NO", then may proceed with Cephalosporin use.     Current Outpatient Medications  Medication Sig Dispense Refill   acetaminophen (TYLENOL) 500 MG tablet Take 500 mg by mouth every 6 (six) hours as needed for mild pain or moderate pain.     betamethasone dipropionate 0.05 % cream APPLY TO THE AFFECTEDVAREA(S) TWICE DAILY ASDNEEDED. (NOT TO FACE, GROIN, UNDER ARMS.)     brimonidine (ALPHAGAN) 0.2 % ophthalmic solution Place 1 drop into both eyes 2 (two) times daily.     carvedilol (COREG) 3.125 MG tablet Take 1 tablet (3.125 mg total) by mouth 2 (two) times daily. 180 tablet 3   clopidogrel (PLAVIX) 75 MG tablet Take 1 tablet (75 mg total) by mouth daily. 30 tablet 3   dorzolamide-timolol (COSOPT) 22.3-6.8 MG/ML ophthalmic solution Place 1 drop into both eyes 2 (two) times daily.     hydrALAZINE (APRESOLINE) 50 MG tablet Take 50 mg by mouth 3 (three) times daily.     latanoprost (XALATAN) 0.005 % ophthalmic solution Place 1 drop into both eyes at bedtime.     lisinopril (PRINIVIL,ZESTRIL) 40 MG tablet Take 40 mg by mouth daily.      mirtazapine (REMERON) 15 MG tablet Take 15 mg by mouth at bedtime.     oxybutynin (DITROPAN-XL) 10 MG 24 hr  tablet Take 10 mg by mouth daily.     pilocarpine (PILOCAR) 4 % ophthalmic solution Place 1 drop into both eyes 4 (four) times daily.     Polyethyl Glycol-Propyl Glycol (SYSTANE OP) Apply 1 drop to eye as needed (for dry eye relief).     Simethicone (GAS-X PO) Take 1 tablet by mouth daily as needed (for gas relief).      simvastatin (ZOCOR) 20 MG tablet Take 20 mg by mouth daily at 6 PM.      spironolactone (ALDACTONE) 25 MG tablet Take 1 tablet by mouth 2 (two) times daily.     No current facility-administered medications for this visit.     Past Medical History:  Diagnosis Date   Allergic rhinitis    Anxiety    Bradycardia    Breast cancer ([place])    Breast mass    left   Breast pain    leftr   Carotid atherosclerosis    Carpal tunnel syndrome    Cataract    Constipation    Cystocele 06/30/2014   Glaucoma    Hyperlipidemia    Hypertension    IBS (irritable bowel syndrome)    Low back pain    Mitral regurgitation    sinus bradycardia   Osteoarthritis    Osteopenia    Rectocele 06/30/2014   Seborrheic keratosis    Venous insufficiency  Vertigo    Vitamin D deficiency     ROS:   All systems reviewed and negative except as noted in the HPI.   Past Surgical History:  Procedure Laterality Date   ABDOMINAL HYSTERECTOMY     APPENDECTOMY     BACK SURGERY     BREAST LUMPECTOMY     CHOLECYSTECTOMY     cyst removed from left breast     EP IMPLANTABLE DEVICE N/A 08/30/2015   Procedure: Pacemaker Implant;  Surgeon: Evans Lance, MD;  Location: [facility];  Service: Cardiovascular;  Laterality: N/A;   EYE SURGERY     for bleed in left eye   keloid     left ear   LUMBAR LAMINECTOMY/DECOMPRESSION MICRODISCECTOMY N/A 12/25/2014   Procedure: Thoracic eleven-twelve Laminectomy/Diskectomy; Lumbar three-four Laminectomy/Diskectomy;  Surgeon: Kristeen Miss, MD;  Location: [facility];  Service: Neurosurgery;  Laterality: N/A;   spurs on back       Family History   Problem Relation Age of Onset   Heart disease Mother    Hypertension Mother    Diabetes Mother    Other Daughter        left breast nodule   Hypertension Son    Hyperlipidemia Son    Heart disease Maternal Grandmother    Hypertension Maternal Grandmother    Hypertension Son    Other Son        boils     Social History   Socioeconomic History   Marital status: Divorced    Spouse name: Not on file   Number of children: Not on file   Years of education: Not on file   Highest education level: Not on file  Occupational History   Not on file  Tobacco Use   Smoking status: Never   Smokeless tobacco: Never  Vaping Use   Vaping Use: Never used  Substance and Sexual Activity   Alcohol use: No    Alcohol/week: 0.0 standard drinks   Drug use: No   Sexual activity: Never    Birth control/protection: Surgical    Comment: hyst  Other Topics Concern   Not on file  Social History Narrative   Not on file   Social Determinants of Health   Financial Resource Strain: Not on file  Food Insecurity: Not on file  Transportation Needs: Not on file  Physical Activity: Not on file  Stress: Not on file  Social Connections: Not on file  Intimate Partner Violence: Not on file     BP 124/82   Pulse 62   Ht 5' 4.5" (1.638 m)   Wt 117 lb (53.1 kg)   SpO2 98%   BMI 19.77 kg/m   Physical Exam:  Well appearing NAD HEENT: Unremarkable except for a large keloid on her left ear Neck:  No JVD, no thyromegally Lymphatics:  No adenopathy Back:  No CVA tenderness Lungs:  Clear with no wheezes HEART:  Regular rate rhythm, no murmurs, no rubs, no clicks Abd:  soft, positive bowel sounds, no organomegally, no rebound, no guarding Ext:  2 plus pulses, no edema, no cyanosis, no clubbing Skin:  No rashes no nodules Neuro:  CN II through XII intact, motor grossly intact   DEVICE  Normal device function.  See PaceArt for details.   Assess/Plan:  1. Sinus node dysfunction - she is  doing well, s/p PPM insertion. 2. PPM - her medtronic DDD PM is working [employer]. We will recheck in several months. 3. HTN - she  will continue to stay with amlodipine for HTN. 4. Peripheral edema - this is improved and at this point requires no additional treatment her than maintaining a low sodium diet.   Mikle Bosworth.D.

## 2020-11-30 NOTE — Patient Instructions (Signed)
Medication Instructions:  Your physician recommends that you continue on your current medications as directed. Please refer to the Current Medication list given to you today.  *If you need a refill on your cardiac medications before your next appointment, please call your pharmacy*   Lab Work: NONE   If you have labs (blood work) drawn today and your tests are completely normal, you will receive your results only by: MyChart Message (if you have MyChart) OR A paper copy in the mail If you have any lab test that is abnormal or we need to change your treatment, we will call you to review the results.   Testing/Procedures: NONE    Follow-Up: At [facility], you and your health needs are our priority.  As part of our continuing mission to provide you with exceptional heart care, we have created designated Provider Care Teams.  These Care Teams include your primary Cardiologist (physician) and Advanced Practice Providers (APPs -  Physician Assistants and Nurse Practitioners) who all work together to provide you with the care you need, when you need it.  We recommend signing up for the patient portal called "MyChart".  Sign up information is provided on this After Visit Summary.  MyChart is used to connect with patients for Virtual Visits (Telemedicine).  Patients are able to view lab/test results, encounter notes, upcoming appointments, etc.  Non-urgent messages can be sent to your provider as well.   To learn more about what you can do with MyChart, go to [email or web address].    Your next appointment:   6 month(s)  The format for your next appointment:   In Person  Provider:   Gregg Taylor, MD   Other Instructions Thank you for choosing [facility]!    

## 2020-12-07 NOTE — Progress Notes (Signed)
Remote pacemaker transmission.   

## 2021-02-01 ENCOUNTER — Other Ambulatory Visit: Payer: Self-pay | Admitting: Internal Medicine

## 2021-02-18 ENCOUNTER — Ambulatory Visit (INDEPENDENT_AMBULATORY_CARE_PROVIDER_SITE_OTHER): Payer: Medicare Other

## 2021-02-18 DIAGNOSIS — I495 Sick sinus syndrome: Secondary | ICD-10-CM | POA: Diagnosis not present

## 2021-02-18 LAB — CUP PACEART REMOTE DEVICE CHECK
Battery Remaining Longevity: 64 mo
Battery Voltage: 3 V
Brady Statistic AP VP Percent: 0.01 %
Brady Statistic AP VS Percent: 22.69 %
Brady Statistic AS VP Percent: 0.03 %
Brady Statistic AS VS Percent: 77.27 %
Brady Statistic RA Percent Paced: 22.63 %
Brady Statistic RV Percent Paced: 0.04 %
Date Time Interrogation Session: 20220909112227
Implantable Lead Implant Date: 20170320
Implantable Lead Implant Date: 20170320
Implantable Lead Location: 753859
Implantable Lead Location: 753860
Implantable Lead Model: 5076
Implantable Lead Model: 5076
Implantable Pulse Generator Implant Date: 20170320
Lead Channel Impedance Value: 285 Ohm
Lead Channel Impedance Value: 418 Ohm
Lead Channel Impedance Value: 418 Ohm
Lead Channel Impedance Value: 494 Ohm
Lead Channel Pacing Threshold Amplitude: 1 V
Lead Channel Pacing Threshold Amplitude: 1 V
Lead Channel Pacing Threshold Pulse Width: 0.4 ms
Lead Channel Pacing Threshold Pulse Width: 0.4 ms
Lead Channel Sensing Intrinsic Amplitude: 1.625 mV
Lead Channel Sensing Intrinsic Amplitude: 1.625 mV
Lead Channel Sensing Intrinsic Amplitude: 17.625 mV
Lead Channel Sensing Intrinsic Amplitude: 17.625 mV
Lead Channel Setting Pacing Amplitude: 2 V
Lead Channel Setting Pacing Amplitude: 2 V
Lead Channel Setting Pacing Pulse Width: 0.4 ms
Lead Channel Setting Sensing Sensitivity: 2.8 mV

## 2021-02-24 NOTE — Progress Notes (Signed)
Remote pacemaker transmission.   

## 2021-04-28 ENCOUNTER — Other Ambulatory Visit: Payer: Self-pay | Admitting: Internal Medicine

## 2021-04-29 NOTE — Telephone Encounter (Signed)
This is a [place] pt.  °

## 2021-05-20 ENCOUNTER — Ambulatory Visit (INDEPENDENT_AMBULATORY_CARE_PROVIDER_SITE_OTHER): Payer: Medicare Other

## 2021-05-20 DIAGNOSIS — I495 Sick sinus syndrome: Secondary | ICD-10-CM

## 2021-05-20 LAB — CUP PACEART REMOTE DEVICE CHECK
Battery Remaining Longevity: 66 mo
Battery Voltage: 3 V
Brady Statistic AP VP Percent: 0.01 %
Brady Statistic AP VS Percent: 15.61 %
Brady Statistic AS VP Percent: 0.03 %
Brady Statistic AS VS Percent: 84.35 %
Brady Statistic RA Percent Paced: 15.56 %
Brady Statistic RV Percent Paced: 0.04 %
Date Time Interrogation Session: 20221209123914
Implantable Lead Implant Date: 20170320
Implantable Lead Implant Date: 20170320
Implantable Lead Location: 753859
Implantable Lead Location: 753860
Implantable Lead Model: 5076
Implantable Lead Model: 5076
Implantable Pulse Generator Implant Date: 20170320
Lead Channel Impedance Value: 304 Ohm
Lead Channel Impedance Value: 418 Ohm
Lead Channel Impedance Value: 437 Ohm
Lead Channel Impedance Value: 513 Ohm
Lead Channel Pacing Threshold Amplitude: 1 V
Lead Channel Pacing Threshold Amplitude: 1 V
Lead Channel Pacing Threshold Pulse Width: 0.4 ms
Lead Channel Pacing Threshold Pulse Width: 0.4 ms
Lead Channel Sensing Intrinsic Amplitude: 1.875 mV
Lead Channel Sensing Intrinsic Amplitude: 1.875 mV
Lead Channel Sensing Intrinsic Amplitude: 17.25 mV
Lead Channel Sensing Intrinsic Amplitude: 17.25 mV
Lead Channel Setting Pacing Amplitude: 2 V
Lead Channel Setting Pacing Amplitude: 2 V
Lead Channel Setting Pacing Pulse Width: 0.4 ms
Lead Channel Setting Sensing Sensitivity: 2.8 mV

## 2021-05-30 NOTE — Progress Notes (Signed)
Remote pacemaker transmission.   

## 2021-08-09 ENCOUNTER — Other Ambulatory Visit: Payer: Self-pay

## 2021-08-09 ENCOUNTER — Encounter: Payer: Self-pay | Admitting: Internal Medicine

## 2021-08-09 ENCOUNTER — Ambulatory Visit (INDEPENDENT_AMBULATORY_CARE_PROVIDER_SITE_OTHER): Payer: Medicare Other | Admitting: Internal Medicine

## 2021-08-09 VITALS — BP 144/68 | HR 64 | Ht 64.5 in | Wt 118.0 lb

## 2021-08-09 DIAGNOSIS — I495 Sick sinus syndrome: Secondary | ICD-10-CM | POA: Diagnosis not present

## 2021-08-09 NOTE — Progress Notes (Signed)
HPI Jodi Peters returns today for followup of HTN and sinus node dysfunction. She is a pleasant 88 yo woman with a h/o HTN, snd s/p PPM and chronic diastolic heart failure. She feels weaker. She notes very mild peripheral edema. She has not had angina. She denies chest pain or sob. her energy level is down.  Allergies  Allergen Reactions   Penicillins Rash    Has patient had a PCN reaction causing immediate rash, facial/tongue/throat swelling, SOB or lightheadedness with hypotension: Yes Has patient had a PCN reaction causing severe rash involving mucus membranes or skin necrosis: No Has patient had a PCN reaction that required hospitalization No Has patient had a PCN reaction occurring within the last 10 years: Yes If all of the above answers are "NO", then may proceed with Cephalosporin use.     Current Outpatient Medications  Medication Sig Dispense Refill   acetaminophen (TYLENOL) 500 MG tablet Take 500 mg by mouth every 6 (six) hours as needed for mild pain or moderate pain.     benzonatate (TESSALON) 100 MG capsule Take 100 mg by mouth 3 (three) times daily as needed.     carvedilol (COREG) 3.125 MG tablet TAKE 1 TABLET BY MOUTH 2 TIMES DAILY. 180 tablet 1   clopidogrel (PLAVIX) 75 MG tablet Take 1 tablet (75 mg total) by mouth daily. 30 tablet 3   hydrALAZINE (APRESOLINE) 50 MG tablet Take 50 mg by mouth 3 (three) times daily.     mirtazapine (REMERON) 15 MG tablet Take 15 mg by mouth at bedtime.     MYRBETRIQ 50 MG TB24 tablet Take 50 mg by mouth daily.     sertraline (ZOLOFT) 25 MG tablet Take 25 mg by mouth every morning.     simvastatin (ZOCOR) 20 MG tablet Take 20 mg by mouth daily at 6 PM.      torsemide (DEMADEX) 10 MG tablet Take 10 mg by mouth daily.     betamethasone dipropionate 0.05 % cream APPLY TO THE AFFECTEDVAREA(S) TWICE DAILY ASDNEEDED. (NOT TO FACE, GROIN, UNDER ARMS.) (Patient not taking: Reported on 08/09/2021)     brimonidine (ALPHAGAN) 0.2 % ophthalmic  solution Place 1 drop into both eyes 2 (two) times daily. (Patient not taking: Reported on 08/09/2021)     dorzolamide-timolol (COSOPT) 22.3-6.8 MG/ML ophthalmic solution Place 1 drop into both eyes 2 (two) times daily. (Patient not taking: Reported on 08/09/2021)     latanoprost (XALATAN) 0.005 % ophthalmic solution Place 1 drop into both eyes at bedtime. (Patient not taking: Reported on 08/09/2021)     lisinopril (PRINIVIL,ZESTRIL) 40 MG tablet Take 40 mg by mouth daily.  (Patient not taking: Reported on 08/09/2021)     oxybutynin (DITROPAN-XL) 10 MG 24 hr tablet Take 10 mg by mouth daily. (Patient not taking: Reported on 08/09/2021)     pilocarpine (PILOCAR) 4 % ophthalmic solution Place 1 drop into both eyes 4 (four) times daily. (Patient not taking: Reported on 08/09/2021)     Polyethyl Glycol-Propyl Glycol (SYSTANE OP) Apply 1 drop to eye as needed (for dry eye relief). (Patient not taking: Reported on 08/09/2021)     Simethicone (GAS-X PO) Take 1 tablet by mouth daily as needed (for gas relief).  (Patient not taking: Reported on 08/09/2021)     spironolactone (ALDACTONE) 25 MG tablet Take 1 tablet by mouth 2 (two) times daily. (Patient not taking: Reported on 08/09/2021)     No current facility-administered medications for this visit.  Past Medical History:  Diagnosis Date   Allergic rhinitis    Anxiety    Bradycardia    Breast cancer (HCC)    Breast mass    left   Breast pain    leftr   Carotid atherosclerosis    Carpal tunnel syndrome    Cataract    Constipation    Cystocele 06/30/2014   Glaucoma    Hyperlipidemia    Hypertension    IBS (irritable bowel syndrome)    Low back pain    Mitral regurgitation    sinus bradycardia   Osteoarthritis    Osteopenia    Rectocele 06/30/2014   Seborrheic keratosis    Venous insufficiency    Vertigo    Vitamin D deficiency     ROS:   All systems reviewed and negative except as noted in the HPI.   Past Surgical History:  Procedure  Laterality Date   ABDOMINAL HYSTERECTOMY     APPENDECTOMY     BACK SURGERY     BREAST LUMPECTOMY     CHOLECYSTECTOMY     cyst removed from left breast     EP IMPLANTABLE DEVICE N/A 08/30/2015   Procedure: Pacemaker Implant;  Surgeon: Evans Lance, MD;  Location: [facility];  Service: Cardiovascular;  Laterality: N/A;   EYE SURGERY     for bleed in left eye   keloid     left ear   LUMBAR LAMINECTOMY/DECOMPRESSION MICRODISCECTOMY N/A 12/25/2014   Procedure: Thoracic eleven-twelve Laminectomy/Diskectomy; Lumbar three-four Laminectomy/Diskectomy;  Surgeon: Kristeen Miss, MD;  Location: [facility];  Service: Neurosurgery;  Laterality: N/A;   spurs on back       Family History  Problem Relation Age of Onset   Heart disease Mother    Hypertension Mother    Diabetes Mother    Other Daughter        left breast nodule   Hypertension Son    Hyperlipidemia Son    Heart disease Maternal Grandmother    Hypertension Maternal Grandmother    Hypertension Son    Other Son        boils     Social History   Socioeconomic History   Marital status: Divorced    Spouse name: Not on file   Number of children: Not on file   Years of education: Not on file   Highest education level: Not on file  Occupational History   Not on file  Tobacco Use   Smoking status: Never   Smokeless tobacco: Never  Vaping Use   Vaping Use: Never used  Substance and Sexual Activity   Alcohol use: No    Alcohol/week: 0.0 standard drinks   Drug use: No   Sexual activity: Never    Birth control/protection: Surgical    Comment: hyst  Other Topics Concern   Not on file  Social History Narrative   Not on file   Social Determinants of Health   Financial Resource Strain: Not on file  Food Insecurity: Not on file  Transportation Needs: Not on file  Physical Activity: Not on file  Stress: Not on file  Social Connections: Not on file  Intimate Partner Violence: Not on file     BP (!) 144/68     Pulse 64    Ht 5' 4.5" (1.638 m)    Wt 118 lb (53.5 kg)    BMI 19.94 kg/m   Physical Exam:  Well appearing NAD HEENT: Unremarkable Neck:  No JVD, no  thyromegally Lymphatics:  No adenopathy Back:  No CVA tenderness Lungs:  Clear HEART:  Regular rate rhythm, no murmurs, no rubs, no clicks Abd:  soft, positive bowel sounds, no organomegally, no rebound, no guarding Ext:  2 plus pulses, no edema, no cyanosis, no clubbing Skin:  No rashes no nodules Neuro:  CN II through XII intact, motor grossly intact  EKG  DEVICE  Normal device function.  See PaceArt for details.   Assess/Plan:  1. Sinus node dysfunction - she is doing well, s/p PPM insertion. 2. PPM - her medtronic DDD PM is working [employer]. We will recheck in several months. 3. HTN - she will continue to stay with amlodipine for HTN. 4. Peripheral edema - this is improved and at this point requires no additional treatment her than maintaining a low sodium diet.   Mikle Bosworth.D.

## 2021-08-09 NOTE — Patient Instructions (Signed)
Medication Instructions:  Your physician recommends that you continue on your current medications as directed. Please refer to the Current Medication list given to you today.  *If you need a refill on your cardiac medications before your next appointment, please call your pharmacy*   Lab Work: NONE   If you have labs (blood work) drawn today and your tests are completely normal, you will receive your results only by: . MyChart Message (if you have MyChart) OR . A paper copy in the mail If you have any lab test that is abnormal or we need to change your treatment, we will call you to review the results.   Testing/Procedures: NONE    Follow-Up: At [facility], you and your health needs are our priority.  As part of our continuing mission to provide you with exceptional heart care, we have created designated Provider Care Teams.  These Care Teams include your primary Cardiologist (physician) and Advanced Practice Providers (APPs -  Physician Assistants and Nurse Practitioners) who all work together to provide you with the care you need, when you need it.  We recommend signing up for the patient portal called "MyChart".  Sign up information is provided on this After Visit Summary.  MyChart is used to connect with patients for Virtual Visits (Telemedicine).  Patients are able to view lab/test results, encounter notes, upcoming appointments, etc.  Non-urgent messages can be sent to your provider as well.   To learn more about what you can do with MyChart, go to [email or web address].    Your next appointment:   1 year(s)  The format for your next appointment:   In Person  Provider:   Gregg Taylor, MD   Other Instructions Thank you for choosing [facility]!    

## 2021-08-19 ENCOUNTER — Ambulatory Visit (INDEPENDENT_AMBULATORY_CARE_PROVIDER_SITE_OTHER): Payer: Medicare Other

## 2021-08-19 DIAGNOSIS — I495 Sick sinus syndrome: Secondary | ICD-10-CM | POA: Diagnosis not present

## 2021-08-19 LAB — CUP PACEART REMOTE DEVICE CHECK
Battery Remaining Longevity: 63 mo
Battery Voltage: 2.99 V
Brady Statistic AP VP Percent: 0.01 %
Brady Statistic AP VS Percent: 17.65 %
Brady Statistic AS VP Percent: 0.03 %
Brady Statistic AS VS Percent: 82.3 %
Brady Statistic RA Percent Paced: 17.53 %
Brady Statistic RV Percent Paced: 0.04 %
Date Time Interrogation Session: 20230310132643
Implantable Lead Implant Date: 20170320
Implantable Lead Implant Date: 20170320
Implantable Lead Location: 753859
Implantable Lead Location: 753860
Implantable Lead Model: 5076
Implantable Lead Model: 5076
Implantable Pulse Generator Implant Date: 20170320
Lead Channel Impedance Value: 323 Ohm
Lead Channel Impedance Value: 418 Ohm
Lead Channel Impedance Value: 437 Ohm
Lead Channel Impedance Value: 513 Ohm
Lead Channel Pacing Threshold Amplitude: 1 V
Lead Channel Pacing Threshold Amplitude: 1 V
Lead Channel Pacing Threshold Pulse Width: 0.4 ms
Lead Channel Pacing Threshold Pulse Width: 0.4 ms
Lead Channel Sensing Intrinsic Amplitude: 17.125 mV
Lead Channel Sensing Intrinsic Amplitude: 17.125 mV
Lead Channel Sensing Intrinsic Amplitude: 2 mV
Lead Channel Sensing Intrinsic Amplitude: 2 mV
Lead Channel Setting Pacing Amplitude: 2 V
Lead Channel Setting Pacing Amplitude: 2 V
Lead Channel Setting Pacing Pulse Width: 0.4 ms
Lead Channel Setting Sensing Sensitivity: 2.8 mV

## 2021-08-24 NOTE — Progress Notes (Signed)
Remote pacemaker transmission.   

## 2021-10-31 ENCOUNTER — Other Ambulatory Visit: Payer: Self-pay | Admitting: Internal Medicine

## 2021-11-18 ENCOUNTER — Ambulatory Visit (INDEPENDENT_AMBULATORY_CARE_PROVIDER_SITE_OTHER): Payer: Medicare Other

## 2021-11-18 DIAGNOSIS — I495 Sick sinus syndrome: Secondary | ICD-10-CM | POA: Diagnosis not present

## 2021-11-20 LAB — CUP PACEART REMOTE DEVICE CHECK
Battery Remaining Longevity: 59 mo
Battery Voltage: 2.99 V
Brady Statistic AP VP Percent: 0.01 %
Brady Statistic AP VS Percent: 15.36 %
Brady Statistic AS VP Percent: 0.03 %
Brady Statistic AS VS Percent: 84.59 %
Brady Statistic RA Percent Paced: 15.32 %
Brady Statistic RV Percent Paced: 0.04 %
Date Time Interrogation Session: 20230609183044
Implantable Lead Implant Date: 20170320
Implantable Lead Implant Date: 20170320
Implantable Lead Location: 753859
Implantable Lead Location: 753860
Implantable Lead Model: 5076
Implantable Lead Model: 5076
Implantable Pulse Generator Implant Date: 20170320
Lead Channel Impedance Value: 304 Ohm
Lead Channel Impedance Value: 399 Ohm
Lead Channel Impedance Value: 437 Ohm
Lead Channel Impedance Value: 494 Ohm
Lead Channel Pacing Threshold Amplitude: 1 V
Lead Channel Pacing Threshold Amplitude: 1 V
Lead Channel Pacing Threshold Pulse Width: 0.4 ms
Lead Channel Pacing Threshold Pulse Width: 0.4 ms
Lead Channel Sensing Intrinsic Amplitude: 1.75 mV
Lead Channel Sensing Intrinsic Amplitude: 1.75 mV
Lead Channel Sensing Intrinsic Amplitude: 16 mV
Lead Channel Sensing Intrinsic Amplitude: 16 mV
Lead Channel Setting Pacing Amplitude: 2 V
Lead Channel Setting Pacing Amplitude: 2 V
Lead Channel Setting Pacing Pulse Width: 0.4 ms
Lead Channel Setting Sensing Sensitivity: 2.8 mV

## 2021-11-24 NOTE — Progress Notes (Signed)
Remote pacemaker transmission.   

## 2022-02-17 ENCOUNTER — Ambulatory Visit (INDEPENDENT_AMBULATORY_CARE_PROVIDER_SITE_OTHER): Payer: Medicare Other

## 2022-02-17 DIAGNOSIS — I495 Sick sinus syndrome: Secondary | ICD-10-CM | POA: Diagnosis not present

## 2022-02-21 LAB — CUP PACEART REMOTE DEVICE CHECK
Battery Remaining Longevity: 52 mo
Battery Voltage: 2.98 V
Brady Statistic AP VP Percent: 0.02 %
Brady Statistic AP VS Percent: 25.03 %
Brady Statistic AS VP Percent: 0.03 %
Brady Statistic AS VS Percent: 74.92 %
Brady Statistic RA Percent Paced: 24.87 %
Brady Statistic RV Percent Paced: 0.05 %
Date Time Interrogation Session: 20230911130917
Implantable Lead Implant Date: 20170320
Implantable Lead Implant Date: 20170320
Implantable Lead Location: 753859
Implantable Lead Location: 753860
Implantable Lead Model: 5076
Implantable Lead Model: 5076
Implantable Pulse Generator Implant Date: 20170320
Lead Channel Impedance Value: 304 Ohm
Lead Channel Impedance Value: 399 Ohm
Lead Channel Impedance Value: 418 Ohm
Lead Channel Impedance Value: 494 Ohm
Lead Channel Pacing Threshold Amplitude: 0.875 V
Lead Channel Pacing Threshold Amplitude: 1 V
Lead Channel Pacing Threshold Pulse Width: 0.4 ms
Lead Channel Pacing Threshold Pulse Width: 0.4 ms
Lead Channel Sensing Intrinsic Amplitude: 1.875 mV
Lead Channel Sensing Intrinsic Amplitude: 1.875 mV
Lead Channel Sensing Intrinsic Amplitude: 15.625 mV
Lead Channel Sensing Intrinsic Amplitude: 15.625 mV
Lead Channel Setting Pacing Amplitude: 1.75 V
Lead Channel Setting Pacing Amplitude: 2.25 V
Lead Channel Setting Pacing Pulse Width: 0.4 ms
Lead Channel Setting Sensing Sensitivity: 2.8 mV

## 2022-03-04 NOTE — Progress Notes (Signed)
Remote pacemaker transmission.   

## 2022-04-03 ENCOUNTER — Ambulatory Visit (HOSPITAL_COMMUNITY)
Admission: RE | Admit: 2022-04-03 | Discharge: 2022-04-03 | Disposition: A | Discharge status: Home / Self Care | Payer: Medicare Other | Source: Ambulatory Visit | Attending: Gerontology | Admitting: Gerontology

## 2022-04-03 ENCOUNTER — Other Ambulatory Visit (HOSPITAL_COMMUNITY): Payer: Self-pay | Admitting: Gerontology

## 2022-04-03 DIAGNOSIS — R200 Anesthesia of skin: Secondary | ICD-10-CM | POA: Insufficient documentation

## 2022-04-10 ENCOUNTER — Emergency Department (HOSPITAL_COMMUNITY): Payer: Medicare Other

## 2022-04-10 ENCOUNTER — Inpatient Hospital Stay (HOSPITAL_COMMUNITY)
Admission: EM | Admit: 2022-04-10 | Discharge: 2022-04-15 | DRG: 682 | Disposition: A | Discharge status: Home Health | Payer: Medicare Other | Attending: Internal Medicine | Admitting: Internal Medicine

## 2022-04-10 ENCOUNTER — Other Ambulatory Visit: Payer: Self-pay

## 2022-04-10 ENCOUNTER — Encounter (HOSPITAL_COMMUNITY): Payer: Self-pay

## 2022-04-10 DIAGNOSIS — N179 Acute kidney failure, unspecified: Secondary | ICD-10-CM | POA: Diagnosis not present

## 2022-04-10 DIAGNOSIS — G9340 Encephalopathy, unspecified: Secondary | ICD-10-CM | POA: Diagnosis present

## 2022-04-10 DIAGNOSIS — I679 Cerebrovascular disease, unspecified: Secondary | ICD-10-CM | POA: Diagnosis present

## 2022-04-10 DIAGNOSIS — G9341 Metabolic encephalopathy: Secondary | ICD-10-CM | POA: Diagnosis present

## 2022-04-10 DIAGNOSIS — Z8249 Family history of ischemic heart disease and other diseases of the circulatory system: Secondary | ICD-10-CM

## 2022-04-10 DIAGNOSIS — E782 Mixed hyperlipidemia: Secondary | ICD-10-CM

## 2022-04-10 DIAGNOSIS — Z90710 Acquired absence of both cervix and uterus: Secondary | ICD-10-CM

## 2022-04-10 DIAGNOSIS — I169 Hypertensive crisis, unspecified: Secondary | ICD-10-CM | POA: Diagnosis present

## 2022-04-10 DIAGNOSIS — R4182 Altered mental status, unspecified: Principal | ICD-10-CM

## 2022-04-10 DIAGNOSIS — F419 Anxiety disorder, unspecified: Secondary | ICD-10-CM | POA: Diagnosis present

## 2022-04-10 DIAGNOSIS — Z20822 Contact with and (suspected) exposure to covid-19: Secondary | ICD-10-CM | POA: Diagnosis present

## 2022-04-10 DIAGNOSIS — I495 Sick sinus syndrome: Secondary | ICD-10-CM | POA: Diagnosis present

## 2022-04-10 DIAGNOSIS — E869 Volume depletion, unspecified: Secondary | ICD-10-CM | POA: Diagnosis present

## 2022-04-10 DIAGNOSIS — I10 Essential (primary) hypertension: Secondary | ICD-10-CM | POA: Diagnosis present

## 2022-04-10 DIAGNOSIS — Z853 Personal history of malignant neoplasm of breast: Secondary | ICD-10-CM

## 2022-04-10 DIAGNOSIS — N1832 Chronic kidney disease, stage 3b: Secondary | ICD-10-CM | POA: Insufficient documentation

## 2022-04-10 DIAGNOSIS — M79641 Pain in right hand: Secondary | ICD-10-CM

## 2022-04-10 DIAGNOSIS — I5032 Chronic diastolic (congestive) heart failure: Secondary | ICD-10-CM | POA: Diagnosis present

## 2022-04-10 DIAGNOSIS — D631 Anemia in chronic kidney disease: Secondary | ICD-10-CM | POA: Diagnosis present

## 2022-04-10 DIAGNOSIS — I130 Hypertensive heart and chronic kidney disease with heart failure and stage 1 through stage 4 chronic kidney disease, or unspecified chronic kidney disease: Secondary | ICD-10-CM | POA: Diagnosis present

## 2022-04-10 DIAGNOSIS — E559 Vitamin D deficiency, unspecified: Secondary | ICD-10-CM | POA: Diagnosis present

## 2022-04-10 DIAGNOSIS — F32A Depression, unspecified: Secondary | ICD-10-CM

## 2022-04-10 DIAGNOSIS — Z833 Family history of diabetes mellitus: Secondary | ICD-10-CM

## 2022-04-10 DIAGNOSIS — T428X5A Adverse effect of antiparkinsonism drugs and other central muscle-tone depressants, initial encounter: Secondary | ICD-10-CM | POA: Diagnosis present

## 2022-04-10 DIAGNOSIS — Z7902 Long term (current) use of antithrombotics/antiplatelets: Secondary | ICD-10-CM

## 2022-04-10 DIAGNOSIS — Z79899 Other long term (current) drug therapy: Secondary | ICD-10-CM

## 2022-04-10 DIAGNOSIS — T43225A Adverse effect of selective serotonin reuptake inhibitors, initial encounter: Secondary | ICD-10-CM | POA: Diagnosis present

## 2022-04-10 DIAGNOSIS — Z950 Presence of cardiac pacemaker: Secondary | ICD-10-CM

## 2022-04-10 DIAGNOSIS — K5901 Slow transit constipation: Secondary | ICD-10-CM | POA: Diagnosis present

## 2022-04-10 DIAGNOSIS — T426X5A Adverse effect of other antiepileptic and sedative-hypnotic drugs, initial encounter: Secondary | ICD-10-CM | POA: Diagnosis present

## 2022-04-10 LAB — CBC WITH DIFFERENTIAL/PLATELET
Abs Immature Granulocytes: 0.02 10*3/uL (ref 0.00–0.07)
Basophils Absolute: 0.1 10*3/uL (ref 0.0–0.1)
Basophils Relative: 1 %
Eosinophils Absolute: 0.1 10*3/uL (ref 0.0–0.5)
Eosinophils Relative: 1 %
HCT: 32.3 % — ABNORMAL LOW (ref 36.0–46.0)
Hemoglobin: 10.3 g/dL — ABNORMAL LOW (ref 12.0–15.0)
Immature Granulocytes: 0 %
Lymphocytes Relative: 19 %
Lymphs Abs: 1.6 10*3/uL (ref 0.7–4.0)
MCH: 29.9 pg (ref 26.0–34.0)
MCHC: 31.9 g/dL (ref 30.0–36.0)
MCV: 93.9 fL (ref 80.0–100.0)
Monocytes Absolute: 1 10*3/uL (ref 0.1–1.0)
Monocytes Relative: 12 %
Neutro Abs: 5.5 10*3/uL (ref 1.7–7.7)
Neutrophils Relative %: 67 %
Platelets: 229 10*3/uL (ref 150–400)
RBC: 3.44 MIL/uL — ABNORMAL LOW (ref 3.87–5.11)
RDW: 14 % (ref 11.5–15.5)
WBC: 8.3 10*3/uL (ref 4.0–10.5)
nRBC: 0 % (ref 0.0–0.2)

## 2022-04-10 LAB — URINALYSIS, ROUTINE W REFLEX MICROSCOPIC
Bilirubin Urine: NEGATIVE
Glucose, UA: NEGATIVE mg/dL
Hgb urine dipstick: NEGATIVE
Ketones, ur: NEGATIVE mg/dL
Leukocytes,Ua: NEGATIVE
Nitrite: NEGATIVE
Protein, ur: NEGATIVE mg/dL
Specific Gravity, Urine: 1.009 (ref 1.005–1.030)
pH: 6 (ref 5.0–8.0)

## 2022-04-10 LAB — COMPREHENSIVE METABOLIC PANEL
ALT: 14 U/L (ref 0–44)
AST: 23 U/L (ref 15–41)
Albumin: 4.1 g/dL (ref 3.5–5.0)
Alkaline Phosphatase: 78 U/L (ref 38–126)
Anion gap: 9 (ref 5–15)
BUN: 43 mg/dL — ABNORMAL HIGH (ref 8–23)
CO2: 26 mmol/L (ref 22–32)
Calcium: 10.1 mg/dL (ref 8.9–10.3)
Chloride: 108 mmol/L (ref 98–111)
Creatinine, Ser: 1.76 mg/dL — ABNORMAL HIGH (ref 0.44–1.00)
GFR, Estimated: 27 mL/min — ABNORMAL LOW (ref >60)
Glucose, Bld: 105 mg/dL — ABNORMAL HIGH (ref 70–99)
Potassium: 3.9 mmol/L (ref 3.5–5.1)
Sodium: 143 mmol/L (ref 135–145)
Total Bilirubin: 0.8 mg/dL (ref 0.3–1.2)
Total Protein: 7.1 g/dL (ref 6.5–8.1)

## 2022-04-10 LAB — CBG MONITORING, ED: Glucose-Capillary: 98 mg/dL (ref 70–99)

## 2022-04-10 MED ORDER — SODIUM CHLORIDE 0.9 % IV BOLUS
500.0000 mL | Freq: Once | INTRAVENOUS | Status: AC
  Administered 2022-04-10: 500 mL via INTRAVENOUS

## 2022-04-10 MED ORDER — HYDRALAZINE HCL 25 MG PO TABS
50.0000 mg | ORAL_TABLET | Freq: Once | ORAL | Status: AC
  Administered 2022-04-10: 50 mg via ORAL
  Filled 2022-04-10: qty 2

## 2022-04-10 NOTE — ED Triage Notes (Signed)
Per EMS CC weakness. Family believes that she has taken too much baclofen. Pt has been confused about when she took the medicine. Pt has not been officially dx with dementia but the family reports that she has it.   BP 116/48 HR 60 CBG 127

## 2022-04-10 NOTE — Assessment & Plan Note (Addendum)
-   Continue Coreg, hydralazine -increased coreg to 12.5 mg bid and hydralazine 100 mg tid - Holding lisinopril in the setting of AKI initially>>restarted and renal function remained stable after restart -added spironolactone --checked renin/aldo ratio, but will be affected by spiro -appreciate nephrology consult>>restart lisinopril -start miralax and senna daily preemptively for constipation if need to start amlodipine SBP 150-160s on day of dc

## 2022-04-10 NOTE — Assessment & Plan Note (Addendum)
-   Creatinine baseline 1.1-1.3 - presenting creatinine is 1.76 - due to volume depletion in setting of lisinopril - Hold torsemide and lisinopril initially - Half a liter bolus in the ED, continued gentle hydration --lisinopril restarted 11/2 - 04/13/22--serum creatinine 1.16>>1.29 -- 04/15/22 serum creatinine 1.28

## 2022-04-10 NOTE — Assessment & Plan Note (Addendum)
-   Hold Zoloft secondary to altered mental status>>restarted

## 2022-04-10 NOTE — ED Provider Notes (Signed)
[facility] EMERGENCY DEPARTMENT Provider Note   CSN: [id number] Arrival date & time: 04/10/22  1331     History  Chief Complaint  Patient presents with   Weakness   Altered Mental Status    Jodi Peters is a 88 y.o. female who presents to the emergency department with concerns for generalized weakness onset today.  Family also notes that patient has been altered since she was started on another medication to aid with her right hand pain that has been chronic.  Patient notes that she started this medication on 4 days ago.  Caregiver at bedside and noted that patient has been altered since.  No meds tried prior to arrival.  Patient denies fever, cough, shortness of breath, chest pain, abdominal pain, nausea, vomiting, urinary symptoms.   The history is provided by the patient and a relative. No language interpreter was used.       Home Medications Prior to Admission medications   Medication Sig Start Date End Date Taking? Authorizing Provider  acetaminophen (TYLENOL) 500 MG tablet Take 500 mg by mouth every 6 (six) hours as needed for mild pain or moderate pain.   Yes [provider]  baclofen (LIORESAL) 10 MG tablet Take 10 mg by mouth at bedtime. 04/05/22  Yes [provider]  carvedilol (COREG) 3.125 MG tablet TAKE 1 TABLET BY MOUTH 2 TIMES DAILY. 10/31/21  Yes Evans Lance, MD  clopidogrel (PLAVIX) 75 MG tablet Take 1 tablet (75 mg total) by mouth daily. 06/23/15  Yes Fanta, Normajean Baxter, MD  gabapentin (NEURONTIN) 100 MG capsule Take 100 mg by mouth 3 (three) times daily. 03/28/22  Yes [provider]  hydrALAZINE (APRESOLINE) 50 MG tablet Take 50 mg by mouth 3 (three) times daily. 11/04/20  Yes [provider]  lisinopril (PRINIVIL,ZESTRIL) 40 MG tablet Take 40 mg by mouth daily. 11/25/15  Yes [provider]  mirtazapine (REMERON) 15 MG tablet Take 15 mg by mouth at bedtime.   Yes [provider]  montelukast  (SINGULAIR) 10 MG tablet Take 10 mg by mouth daily. 03/17/22  Yes [provider]  MYRBETRIQ 50 MG TB24 tablet Take 50 mg by mouth daily. 07/14/21  Yes [provider]  sertraline (ZOLOFT) 25 MG tablet Take 25 mg by mouth every morning. 07/06/21  Yes [provider]  simvastatin (ZOCOR) 20 MG tablet Take 20 mg by mouth daily at 6 PM.  07/13/15  Yes [provider]  torsemide (DEMADEX) 10 MG tablet Take 10 mg by mouth daily. 08/08/21  Yes [provider]  benzonatate (TESSALON) 100 MG capsule Take 100 mg by mouth 3 (three) times daily as needed. Patient not taking: Reported on 04/10/2022 06/08/21   [provider]  MYRBETRIQ 25 MG TB24 tablet Take 25 mg by mouth daily. Patient not taking: Reported on 04/10/2022 01/12/22   [provider]      Allergies    Penicillins    Review of Systems   Review of Systems  Constitutional:  Negative for fever.  Respiratory:  Negative for cough and shortness of breath.   Cardiovascular:  Negative for chest pain.  Gastrointestinal:  Negative for abdominal pain, nausea and vomiting.  Genitourinary:  Negative for dysuria and hematuria.  Neurological:  Positive for dizziness and weakness.  All other systems reviewed and are negative.   Physical Exam Updated Vital Signs BP (!) 182/65   Pulse 75   Temp 98.5 F (36.9 C)   Resp 20  Ht '5\' 4"'$  (1.626 m)   Wt 51.7 kg   SpO2 97%   BMI 19.57 kg/m  Physical Exam Vitals and nursing note reviewed.  Constitutional:      General: She is not in acute distress.    Appearance: She is not diaphoretic.  HENT:     Head: Normocephalic and atraumatic.     Mouth/Throat:     Pharynx: No oropharyngeal exudate.  Eyes:     General: No scleral icterus.    Conjunctiva/sclera: Conjunctivae normal.  Cardiovascular:     Rate and Rhythm: Normal rate and regular rhythm.     Pulses: Normal pulses.     Heart sounds: Normal heart sounds.  Pulmonary:     Effort:  Pulmonary effort is normal. No respiratory distress.     Breath sounds: Normal breath sounds. No wheezing.  Abdominal:     General: Bowel sounds are normal.     Palpations: Abdomen is soft. There is no mass.     Tenderness: There is no abdominal tenderness. There is no guarding or rebound.  Musculoskeletal:        General: Normal range of motion.     Cervical back: Normal range of motion and neck supple.  Skin:    General: Skin is warm and dry.  Neurological:     Mental Status: She is alert.     Comments: no focal neurological deficits. Negative pronator drift.  Grip strength 5/5 bilaterally.  Strength sensation intact to bilateral upper and lower extremities.  Psychiatric:        Behavior: Behavior normal.     ED Results / Procedures / Treatments   Labs (all labs ordered are listed, but only abnormal results are displayed) Labs Reviewed  COMPREHENSIVE METABOLIC PANEL - Abnormal; Notable for the following components:      Result Value   Glucose, Bld 105 (*)    BUN 43 (*)    Creatinine, Ser 1.76 (*)    GFR, Estimated 27 (*)    All other components within normal limits  CBC WITH DIFFERENTIAL/PLATELET - Abnormal; Notable for the following components:   RBC 3.44 (*)    Hemoglobin 10.3 (*)    HCT 32.3 (*)    All other components within normal limits  URINALYSIS, ROUTINE W REFLEX MICROSCOPIC - Abnormal; Notable for the following components:   Color, Urine STRAW (*)    All other components within normal limits  CBG MONITORING, ED    EKG EKG Interpretation  Date/Time:  Monday April 10 2022 15:22:03 EDT Ventricular Rate:  76 PR Interval:  170 QRS Duration: 96 QT Interval:  409 QTC Calculation: 460 R Axis:   45 Text Interpretation: Sinus rhythm Confirmed by Godfrey Pick ([id number]) on 04/10/2022 5:16:56 PM  Radiology CT ABDOMEN PELVIS WO CONTRAST  Result Date: 04/10/2022 CLINICAL DATA:  88 year old with constipation. Patient reports no abdominal pain. EXAM: CT ABDOMEN AND  PELVIS WITHOUT CONTRAST TECHNIQUE: Multidetector CT imaging of the abdomen and pelvis was performed following the standard protocol without IV contrast. RADIATION DOSE REDUCTION: This exam was performed according to the departmental dose-optimization program which includes automated exposure control, adjustment of the mA and/or kV according to patient size and/or use of iterative reconstruction technique. COMPARISON:  Remote contrast-enhanced exam 04/19/2004 FINDINGS: Lower chest: Normal heart size. Pacemaker wires partially included. Subsegmental atelectasis in the left lower lobe. No basilar airspace disease or pleural effusion. Hepatobiliary: No focal liver abnormality is seen. Status post cholecystectomy. No biliary dilatation. Pancreas: No ductal dilatation or  inflammation. Spleen: Normal in size without focal abnormality. Adrenals/Urinary Tract: No adrenal nodule. Slight prominence of both renal collecting systems without frank hydronephrosis. Punctate nonobstructing stone in the upper pole of the left kidney. No significant perinephric edema. Left renal cysts. No specific imaging follow-up is needed. Mild bladder distention without wall thickening. Stomach/Bowel: The stomach is decompressed. There is no small bowel obstruction or inflammation. Minimal fecalization of distal small bowel contents. Appendix not confidently visualized, no evidence of appendicitis. Moderate volume of stool throughout the colon. Scattered colonic diverticula, no diverticulitis. No colonic wall thickening or inflammatory change. Possible small rectocele. Vascular/Lymphatic: Moderate aortic and branch atherosclerosis. No aortic aneurysm. No bulky abdominopelvic adenopathy. Reproductive: The uterus is not seen, presumably surgically absent. There is no adnexal mass. Other: No free air. No ascites. No abdominopelvic collection. Tiny fat containing umbilical hernia. Musculoskeletal: Diffuse degenerative change in the spine. Degenerative  change in the pubic symphysis and both hips. There are no acute or suspicious osseous abnormalities. IMPRESSION: 1. Moderate colonic stool burden with fecalization of distal small bowel contents, suggesting slow transit/constipation. No bowel obstruction or inflammation. Possible small rectocele. 2. Punctate nonobstructing stone in the upper pole of the left kidney. 3. Colonic diverticulosis without diverticulitis. Aortic Atherosclerosis (ICD10-I70.0). Electronically Signed   By: Keith Rake M.D.   On: 04/10/2022 18:21   DG Chest Port 1 View  Result Date: 04/10/2022 CLINICAL DATA:  Altered mental status EXAM: PORTABLE CHEST 1 VIEW COMPARISON:  Chest x-ray 12/20/2009 FINDINGS: Left sided pacemaker is unchanged in position. The heart size and mediastinal contours are within normal limits. Both lungs are clear. The visualized skeletal structures are unremarkable. IMPRESSION: No active disease. Electronically Signed   By: Ronney Asters M.D.   On: 04/10/2022 16:36   CT Head Wo Contrast  Result Date: 04/10/2022 CLINICAL DATA:  Weakness and confusion EXAM: CT HEAD WITHOUT CONTRAST TECHNIQUE: Contiguous axial images were obtained from the base of the skull through the vertex without intravenous contrast. RADIATION DOSE REDUCTION: This exam was performed according to the departmental dose-optimization program which includes automated exposure control, adjustment of the mA and/or kV according to patient size and/or use of iterative reconstruction technique. COMPARISON:  11/10/2020 FINDINGS: Brain: Periventricular white matter and corona radiata hypodensities favor chronic ischemic microvascular white matter disease. Confluence in the right frontal lobe potentially with a small chronic region of remote right frontal lobe infarct on image 17 series 2. This is unchanged from 11/10/2020. Otherwise, the brainstem, cerebellum, cerebral peduncles, thalamus, basal ganglia, basilar cisterns, and ventricular system  appear within normal limits. No intracranial hemorrhage, mass lesion, or acute CVA. Vascular: There is atherosclerotic calcification of the cavernous carotid arteries bilaterally. Skull: Hyperostosis frontalis interna. Sinuses/Orbits: Unremarkable Other: Bulbous expansion of the helix and lobule of the left external ear, similar to previous, possibly from remote trauma or keloid. IMPRESSION: 1. No acute intracranial findings. 2. Periventricular white matter and corona radiata hypodensities favor chronic ischemic microvascular white matter disease. 3. Small region of chronic region of encephalomalacia in the right frontal lobe. 4. Atherosclerosis. 5. Bulbous expansion of the helix and lobule of the left external ear, similar to previous, possibly from remote trauma or keloid. Electronically Signed   By: Van Clines M.D.   On: 04/10/2022 15:05    Procedures Fecal disimpaction  Date/Time: 04/10/2022 5:32 PM  Performed by: Steva Colder A, PA-C Authorized by: Nehemiah Settle, PA-C  Consent: Verbal consent obtained. Risks and benefits: risks, benefits and alternatives were discussed Consent given by: patient  Patient identity confirmed: verbally with patient Preparation: Patient was prepped and draped in the usual sterile fashion. Local anesthesia used: no  Anesthesia: Local anesthesia used: no  Sedation: Patient sedated: no  Patient tolerance: patient tolerated the procedure well with no immediate complications       Medications Ordered in ED Medications  sodium chloride 0.9 % bolus 500 mL (0 mLs Intravenous Stopped 04/10/22 1907)  hydrALAZINE (APRESOLINE) tablet 50 mg (50 mg Oral Given 04/10/22 1942)    ED Course/ Medical Decision Making/ A&P Clinical Course as of 04/10/22 2258  Mon Apr 10, 2022  2213 Consult to hospitalist, Dr. Clearence Ped who recommends admission. [SB]    Clinical Course User Index [SB] Tavarius Grewe A, PA-C                           Medical Decision  Making Amount and/or Complexity of Data Reviewed Labs: ordered. Radiology: ordered.  Risk Prescription drug management. Decision regarding hospitalization.   Pt presents with generalized weakness and altered mental status onset 4 days. Vital signs, patient afebrile. On exam, pt with no focal neurological deficits. Negative pronator drift.  Grip strength 5/5 bilaterally.  Strength sensation intact to bilateral upper and lower extremities. No acute cardiovascular, respiratory, abdominal exam findings. Differential diagnosis includes electrolyte abnormality, hypoglycemia, CVA, TIA, intracranial abnormality, pneumonia.    Co morbidities that complicate the patient evaluation: Hypertension Hyperlipidemia   Additional history obtained:  Additional history obtained from Daughter/Son and Caregiver  Labs:  I ordered, and personally interpreted labs.  The pertinent results include:   Urinalysis unremarkable. CMP slightly elevated creatinine and BUN, likely AKI CBC without leukocytosis. CBG unremarkable  Imaging: I ordered imaging studies including chest x-ray, CT head, CT abdomen pelvis I independently visualized and interpreted imaging which showed: CT head without acute findings.  Chest x-ray without acute concerning findings at this time.  CT abdomen pelvis notable for moderate amount of stool otherwise no acute findings. I agree with the radiologist interpretation  Medications:  I ordered medication including IV fluids, home dose of hydralazine for symptom management Reevaluation of the patient after these medicines and interventions, I reevaluated the patient and found that they have improved I have reviewed the patients home medicines and have made adjustments as needed    Consultations: I requested consultation with the Hospitalist, Dr. Clearence Ped and discussed lab and imaging findings as well as pertinent plan - they recommend: admission for  observation   Disposition: Presenting suspicious for mental status change and generalized weakness.  Doubt electrolyte abnormality, hypoglycemia, CVA, TIA, intracranial abnormality, pneumonia, acute cystitis at this time. After consideration of the diagnostic results and the patients response to treatment, I feel that the patient would benefit from Admission to the hospital.  Discussed with patient and sons at bedside regarding plans for admission, patient is agreeable with admission at this time.  Patient appears safe for admission at this time.      This chart was dictated using voice recognition software, Dragon. Despite the best efforts of this provider to proofread and correct errors, errors may still occur which can change documentation meaning.  Final Clinical Impression(s) / ED Diagnoses Final diagnoses:  Altered mental status, unspecified altered mental status type  AKI (acute kidney injury) [facility])    Rx / DC Orders ED Discharge Orders     None         Faruq Rosenberger A, PA-C 04/10/22 2300    Godfrey Pick, MD 04/11/22  2111  

## 2022-04-10 NOTE — Assessment & Plan Note (Signed)
Continue statin. 

## 2022-04-10 NOTE — Assessment & Plan Note (Signed)
-   Continue Plavix and statin

## 2022-04-10 NOTE — Assessment & Plan Note (Signed)
-   Systolic as high as 909 - Patient has a history of difficult to control blood pressure on 3 antihypertensives at home - Continue Coreg and hydralazine - Holding the setting of AKI - Monitor in stepdown so that if needed, we can escalate to Cardene drip

## 2022-04-11 ENCOUNTER — Encounter (HOSPITAL_COMMUNITY): Payer: Self-pay | Admitting: Family Medicine

## 2022-04-11 DIAGNOSIS — Z853 Personal history of malignant neoplasm of breast: Secondary | ICD-10-CM | POA: Diagnosis not present

## 2022-04-11 DIAGNOSIS — I169 Hypertensive crisis, unspecified: Secondary | ICD-10-CM

## 2022-04-11 DIAGNOSIS — E782 Mixed hyperlipidemia: Secondary | ICD-10-CM

## 2022-04-11 DIAGNOSIS — T426X5A Adverse effect of other antiepileptic and sedative-hypnotic drugs, initial encounter: Secondary | ICD-10-CM | POA: Diagnosis present

## 2022-04-11 DIAGNOSIS — F32A Depression, unspecified: Secondary | ICD-10-CM | POA: Diagnosis present

## 2022-04-11 DIAGNOSIS — T428X5A Adverse effect of antiparkinsonism drugs and other central muscle-tone depressants, initial encounter: Secondary | ICD-10-CM | POA: Diagnosis present

## 2022-04-11 DIAGNOSIS — N179 Acute kidney failure, unspecified: Secondary | ICD-10-CM

## 2022-04-11 DIAGNOSIS — D631 Anemia in chronic kidney disease: Secondary | ICD-10-CM | POA: Diagnosis present

## 2022-04-11 DIAGNOSIS — I10 Essential (primary) hypertension: Secondary | ICD-10-CM | POA: Diagnosis not present

## 2022-04-11 DIAGNOSIS — G9341 Metabolic encephalopathy: Secondary | ICD-10-CM | POA: Diagnosis present

## 2022-04-11 DIAGNOSIS — E559 Vitamin D deficiency, unspecified: Secondary | ICD-10-CM | POA: Diagnosis present

## 2022-04-11 DIAGNOSIS — Z950 Presence of cardiac pacemaker: Secondary | ICD-10-CM | POA: Diagnosis not present

## 2022-04-11 DIAGNOSIS — Z7902 Long term (current) use of antithrombotics/antiplatelets: Secondary | ICD-10-CM | POA: Diagnosis not present

## 2022-04-11 DIAGNOSIS — I495 Sick sinus syndrome: Secondary | ICD-10-CM | POA: Diagnosis present

## 2022-04-11 DIAGNOSIS — Z90710 Acquired absence of both cervix and uterus: Secondary | ICD-10-CM | POA: Diagnosis not present

## 2022-04-11 DIAGNOSIS — I130 Hypertensive heart and chronic kidney disease with heart failure and stage 1 through stage 4 chronic kidney disease, or unspecified chronic kidney disease: Secondary | ICD-10-CM | POA: Diagnosis present

## 2022-04-11 DIAGNOSIS — M79641 Pain in right hand: Secondary | ICD-10-CM | POA: Diagnosis not present

## 2022-04-11 DIAGNOSIS — I5032 Chronic diastolic (congestive) heart failure: Secondary | ICD-10-CM | POA: Diagnosis present

## 2022-04-11 DIAGNOSIS — Z8249 Family history of ischemic heart disease and other diseases of the circulatory system: Secondary | ICD-10-CM | POA: Diagnosis not present

## 2022-04-11 DIAGNOSIS — F419 Anxiety disorder, unspecified: Secondary | ICD-10-CM | POA: Diagnosis present

## 2022-04-11 DIAGNOSIS — E869 Volume depletion, unspecified: Secondary | ICD-10-CM | POA: Diagnosis present

## 2022-04-11 DIAGNOSIS — G9340 Encephalopathy, unspecified: Secondary | ICD-10-CM | POA: Diagnosis not present

## 2022-04-11 DIAGNOSIS — I679 Cerebrovascular disease, unspecified: Secondary | ICD-10-CM | POA: Diagnosis not present

## 2022-04-11 DIAGNOSIS — N1832 Chronic kidney disease, stage 3b: Secondary | ICD-10-CM | POA: Diagnosis present

## 2022-04-11 DIAGNOSIS — Z20822 Contact with and (suspected) exposure to covid-19: Secondary | ICD-10-CM | POA: Diagnosis present

## 2022-04-11 DIAGNOSIS — Z833 Family history of diabetes mellitus: Secondary | ICD-10-CM | POA: Diagnosis not present

## 2022-04-11 DIAGNOSIS — K5901 Slow transit constipation: Secondary | ICD-10-CM | POA: Diagnosis present

## 2022-04-11 DIAGNOSIS — Z79899 Other long term (current) drug therapy: Secondary | ICD-10-CM | POA: Diagnosis not present

## 2022-04-11 LAB — CBC WITH DIFFERENTIAL/PLATELET
Abs Immature Granulocytes: 0.01 10*3/uL (ref 0.00–0.07)
Basophils Absolute: 0.1 10*3/uL (ref 0.0–0.1)
Basophils Relative: 1 %
Eosinophils Absolute: 0.2 10*3/uL (ref 0.0–0.5)
Eosinophils Relative: 3 %
HCT: 30.9 % — ABNORMAL LOW (ref 36.0–46.0)
Hemoglobin: 9.9 g/dL — ABNORMAL LOW (ref 12.0–15.0)
Immature Granulocytes: 0 %
Lymphocytes Relative: 30 %
Lymphs Abs: 2 10*3/uL (ref 0.7–4.0)
MCH: 29.9 pg (ref 26.0–34.0)
MCHC: 32 g/dL (ref 30.0–36.0)
MCV: 93.4 fL (ref 80.0–100.0)
Monocytes Absolute: 0.8 10*3/uL (ref 0.1–1.0)
Monocytes Relative: 13 %
Neutro Abs: 3.4 10*3/uL (ref 1.7–7.7)
Neutrophils Relative %: 53 %
Platelets: 220 10*3/uL (ref 150–400)
RBC: 3.31 MIL/uL — ABNORMAL LOW (ref 3.87–5.11)
RDW: 14.1 % (ref 11.5–15.5)
WBC: 6.5 10*3/uL (ref 4.0–10.5)
nRBC: 0 % (ref 0.0–0.2)

## 2022-04-11 LAB — COMPREHENSIVE METABOLIC PANEL
ALT: 14 U/L (ref 0–44)
AST: 20 U/L (ref 15–41)
Albumin: 3.8 g/dL (ref 3.5–5.0)
Alkaline Phosphatase: 64 U/L (ref 38–126)
Anion gap: 8 (ref 5–15)
BUN: 36 mg/dL — ABNORMAL HIGH (ref 8–23)
CO2: 26 mmol/L (ref 22–32)
Calcium: 9.8 mg/dL (ref 8.9–10.3)
Chloride: 109 mmol/L (ref 98–111)
Creatinine, Ser: 1.45 mg/dL — ABNORMAL HIGH (ref 0.44–1.00)
GFR, Estimated: 34 mL/min — ABNORMAL LOW (ref >60)
Glucose, Bld: 98 mg/dL (ref 70–99)
Potassium: 3.7 mmol/L (ref 3.5–5.1)
Sodium: 143 mmol/L (ref 135–145)
Total Bilirubin: 1.1 mg/dL (ref 0.3–1.2)
Total Protein: 6.5 g/dL (ref 6.5–8.1)

## 2022-04-11 LAB — TSH: TSH: 1.939 u[IU]/mL (ref 0.350–4.500)

## 2022-04-11 LAB — MAGNESIUM: Magnesium: 2.3 mg/dL (ref 1.7–2.4)

## 2022-04-11 MED ORDER — OXYCODONE HCL 5 MG PO TABS: 5.0000 mg | ORAL_TABLET | ORAL | Status: DC | PRN

## 2022-04-11 MED ORDER — ACETAMINOPHEN 650 MG RE SUPP: 650.0000 mg | Freq: Four times a day (QID) | RECTAL | Status: DC | PRN

## 2022-04-11 MED ORDER — SODIUM CHLORIDE 0.9 % IV SOLN: INTRAVENOUS | Status: DC

## 2022-04-11 MED ORDER — MONTELUKAST SODIUM 10 MG PO TABS
10.0000 mg | ORAL_TABLET | Freq: Every day | ORAL | Status: DC
  Administered 2022-04-11 – 2022-04-15 (×5): 10 mg via ORAL
  Filled 2022-04-11 (×5): qty 1

## 2022-04-11 MED ORDER — ORAL CARE MOUTH RINSE: 15.0000 mL | OROMUCOSAL | Status: DC | PRN

## 2022-04-11 MED ORDER — ACETAMINOPHEN 325 MG PO TABS
650.0000 mg | ORAL_TABLET | Freq: Four times a day (QID) | ORAL | Status: DC | PRN
  Administered 2022-04-11 – 2022-04-15 (×2): 650 mg via ORAL
  Filled 2022-04-11 (×2): qty 2

## 2022-04-11 MED ORDER — CLOPIDOGREL BISULFATE 75 MG PO TABS
75.0000 mg | ORAL_TABLET | Freq: Every day | ORAL | Status: DC
  Administered 2022-04-11 – 2022-04-15 (×5): 75 mg via ORAL
  Filled 2022-04-11 (×5): qty 1

## 2022-04-11 MED ORDER — HEPARIN SODIUM (PORCINE) 5000 UNIT/ML IJ SOLN
5000.0000 [IU] | Freq: Three times a day (TID) | INTRAMUSCULAR | Status: DC
  Administered 2022-04-11 – 2022-04-14 (×10): 5000 [IU] via SUBCUTANEOUS
  Filled 2022-04-11 (×11): qty 1

## 2022-04-11 MED ORDER — MECLIZINE HCL 12.5 MG PO TABS: 25.0000 mg | ORAL_TABLET | Freq: Three times a day (TID) | ORAL | Status: DC | PRN

## 2022-04-11 MED ORDER — ONDANSETRON HCL 4 MG/2ML IJ SOLN: 4.0000 mg | Freq: Four times a day (QID) | INTRAMUSCULAR | Status: DC | PRN

## 2022-04-11 MED ORDER — TRAMADOL HCL 50 MG PO TABS
50.0000 mg | ORAL_TABLET | Freq: Once | ORAL | Status: AC | PRN
  Administered 2022-04-11: 50 mg via ORAL
  Filled 2022-04-11: qty 1

## 2022-04-11 MED ORDER — HYDRALAZINE HCL 25 MG PO TABS
50.0000 mg | ORAL_TABLET | Freq: Three times a day (TID) | ORAL | Status: DC
  Administered 2022-04-11 – 2022-04-12 (×4): 50 mg via ORAL
  Filled 2022-04-11 (×4): qty 2

## 2022-04-11 MED ORDER — CARVEDILOL 3.125 MG PO TABS
3.1250 mg | ORAL_TABLET | Freq: Two times a day (BID) | ORAL | Status: DC
  Administered 2022-04-11 – 2022-04-12 (×4): 3.125 mg via ORAL
  Filled 2022-04-11 (×4): qty 1

## 2022-04-11 MED ORDER — SIMVASTATIN 20 MG PO TABS
20.0000 mg | ORAL_TABLET | Freq: Every day | ORAL | Status: DC
  Administered 2022-04-11 – 2022-04-14 (×4): 20 mg via ORAL
  Filled 2022-04-11 (×4): qty 1

## 2022-04-11 MED ORDER — MIRABEGRON ER 25 MG PO TB24
50.0000 mg | ORAL_TABLET | Freq: Every day | ORAL | Status: DC
  Administered 2022-04-11 – 2022-04-15 (×5): 50 mg via ORAL
  Filled 2022-04-11 (×5): qty 2

## 2022-04-11 MED ORDER — ONDANSETRON HCL 4 MG PO TABS: 4.0000 mg | ORAL_TABLET | Freq: Four times a day (QID) | ORAL | Status: DC | PRN

## 2022-04-11 MED ORDER — HYDRALAZINE HCL 20 MG/ML IJ SOLN
10.0000 mg | Freq: Four times a day (QID) | INTRAMUSCULAR | Status: DC | PRN
  Administered 2022-04-12: 10 mg via INTRAVENOUS
  Filled 2022-04-11: qty 1

## 2022-04-11 NOTE — Assessment & Plan Note (Addendum)
-   Patient reportedly confused PTA - Patient remembers being incoherent and feeling foolish -multifactorial including baclofen, AKI, HTN - Polypharmacy likely also contributing, patient was recently started on baclofen for hand pain. - Holding baclofen, gabapentin, Zoloft as these all could be contributing - 04/12/22 A&O x 4 and remained stable thereafter - CT head shows no acute intracranial findings - UA is not indicative of UTI - Chest x-ray shows no active disease -multifactorial due to HTN, acute on chronic renal failure, baclofen

## 2022-04-11 NOTE — Progress Notes (Signed)
Patient seen and examined; admitted after midnight secondary to altered mental status.  She is currently improving and appears to be with a clear mentation.  Presentation appears to be multifactorial in the setting of polypharmacy and hypertensive encephalopathy.  Patient was also found with acute kidney injury in the setting of prerenal azotemia we will continue nephrotoxic agents.  No fever, no dysuria, no hematuria, no chest pain, no nausea, no vomiting.  Please refer to H&P written by Dr. Clearence Ped on 04/11/22 for further info/details on admission.  Plan: -Maintain adequate hydration -Continue the use of Coreg and hydralazine for blood pressure control -Minimize sedatives/altering mentation agents. -checking B12 level to complete work up. -TSH WNL. -follow clinical response and provide constant re-orientation. -patient mentation has improved; but still not back to her baseline.   Barton Dubois MD [phone]

## 2022-04-11 NOTE — H&P (Signed)
History and Physical    Patient: Jodi Peters HWE:[id number] DOB: 1930/03/12 DOA: 04/10/2022 DOS: the patient was seen and examined on 04/11/2022 PCP: Carrolyn Meiers, MD  Patient coming from: Home  Chief Complaint:  Chief Complaint  Patient presents with   Weakness   Altered Mental Status   HPI: Jodi Peters is a 88 y.o. female with medical history significant of anxiety, carotid atherosclerosis, carpal tunnel syndrome, constipation status post disimpaction in the ED, hyperlipidemia, hypertension, seborrheic keratosis, keloid of the left ear, mitral regurg, and more presents to the ED with a chief complaint of altered mental status.  Patient had reported that she was feeling confused, talking incoherently, and feeling foolish.  She reports that she was given a new medication for right hand pain, and that the medication was too strong for her.  She reports her right hand bothering her all the time including during the exam. Patient does have a history of carpal tunnel syndrome.  Patient describes the pain in her right hand as sharp pain, 6 out of 10.  Sometimes it makes her crazy to God and yell per her report.  It wakes her up at night.  This is the pain when she was trying to treat with baclofen.  Now the patient is back to her reported baseline mentation, she will likely be able to go home and her medications that can be contributing to altered mental status, aside from baclofen.  Patient does not smoke, does not drink.  She is vaccinated for COVID.  Patient is full code but would not want to be left on life support long-term Review of Systems: As mentioned in the history of present illness. All other systems reviewed and are negative. Past Medical History:  Diagnosis Date   Allergic rhinitis    Anxiety    Bradycardia    Breast cancer ([place])    Breast mass    left   Breast pain    leftr   Carotid atherosclerosis    Carpal tunnel syndrome    Cataract    Constipation     Cystocele 06/30/2014   Glaucoma    Hyperlipidemia    Hypertension    IBS (irritable bowel syndrome)    Low back pain    Mitral regurgitation    sinus bradycardia   Osteoarthritis    Osteopenia    Rectocele 06/30/2014   Seborrheic keratosis    Venous insufficiency    Vertigo    Vitamin D deficiency    Past Surgical History:  Procedure Laterality Date   ABDOMINAL HYSTERECTOMY     APPENDECTOMY     BACK SURGERY     BREAST LUMPECTOMY     CHOLECYSTECTOMY     cyst removed from left breast     EP IMPLANTABLE DEVICE N/A 08/30/2015   Procedure: Pacemaker Implant;  Surgeon: Evans Lance, MD;  Location: [facility];  Service: Cardiovascular;  Laterality: N/A;   EYE SURGERY     for bleed in left eye   keloid     left ear   LUMBAR LAMINECTOMY/DECOMPRESSION MICRODISCECTOMY N/A 12/25/2014   Procedure: Thoracic eleven-twelve Laminectomy/Diskectomy; Lumbar three-four Laminectomy/Diskectomy;  Surgeon: Kristeen Miss, MD;  Location: [facility];  Service: Neurosurgery;  Laterality: N/A;   spurs on back     Social History:  reports that she has never smoked. She has never used smokeless tobacco. She reports that she does not drink alcohol and does not use drugs.  Allergies  Allergen Reactions  Penicillins Rash    Has patient had a PCN reaction causing immediate rash, facial/tongue/throat swelling, SOB or lightheadedness with hypotension: Yes Has patient had a PCN reaction causing severe rash involving mucus membranes or skin necrosis: No Has patient had a PCN reaction that required hospitalization No Has patient had a PCN reaction occurring within the last 10 years: Yes If all of the above answers are "NO", then may proceed with Cephalosporin use.    Family History  Problem Relation Age of Onset   Heart disease Mother    Hypertension Mother    Diabetes Mother    Other Daughter        left breast nodule   Hypertension Son    Hyperlipidemia Son    Heart disease Maternal  Grandmother    Hypertension Maternal Grandmother    Hypertension Son    Other Son        boils    Prior to Admission medications   Medication Sig Start Date End Date Taking? Authorizing Provider  acetaminophen (TYLENOL) 500 MG tablet Take 500 mg by mouth every 6 (six) hours as needed for mild pain or moderate pain.   Yes [provider]  baclofen (LIORESAL) 10 MG tablet Take 10 mg by mouth at bedtime. 04/05/22  Yes [provider]  carvedilol (COREG) 3.125 MG tablet TAKE 1 TABLET BY MOUTH 2 TIMES DAILY. 10/31/21  Yes Evans Lance, MD  clopidogrel (PLAVIX) 75 MG tablet Take 1 tablet (75 mg total) by mouth daily. 06/23/15  Yes Fanta, Normajean Baxter, MD  gabapentin (NEURONTIN) 100 MG capsule Take 100 mg by mouth 3 (three) times daily. 03/28/22  Yes [provider]  hydrALAZINE (APRESOLINE) 50 MG tablet Take 50 mg by mouth 3 (three) times daily. 11/04/20  Yes [provider]  lisinopril (PRINIVIL,ZESTRIL) 40 MG tablet Take 40 mg by mouth daily. 11/25/15  Yes [provider]  mirtazapine (REMERON) 15 MG tablet Take 15 mg by mouth at bedtime.   Yes [provider]  montelukast (SINGULAIR) 10 MG tablet Take 10 mg by mouth daily. 03/17/22  Yes [provider]  MYRBETRIQ 50 MG TB24 tablet Take 50 mg by mouth daily. 07/14/21  Yes [provider]  sertraline (ZOLOFT) 25 MG tablet Take 25 mg by mouth every morning. 07/06/21  Yes [provider]  simvastatin (ZOCOR) 20 MG tablet Take 20 mg by mouth daily at 6 PM.  07/13/15  Yes [provider]  torsemide (DEMADEX) 10 MG tablet Take 10 mg by mouth daily. 08/08/21  Yes [provider]  benzonatate (TESSALON) 100 MG capsule Take 100 mg by mouth 3 (three) times daily as needed. Patient not taking: Reported on 04/10/2022 06/08/21   [provider]  MYRBETRIQ 25 MG TB24 tablet Take 25 mg by mouth daily. Patient not taking: Reported on 04/10/2022 01/12/22    [provider]    Physical Exam: Vitals:   04/10/22 2130 04/10/22 2200 04/10/22 2230 04/11/22 0023  BP: (!) 179/60 (!) 190/65 (!) 182/65 (!) 177/57  Pulse: 76 74 75 71  Resp: '18 17 20 16  '$ Temp:    (!) 96.8 F (36 C)  TempSrc:      SpO2: 95% 97% 97% 100%  Weight:    53.6 kg  Height:       1.  General: Patient lying supine in bed,  no acute distress   2. Psychiatric: Alert and oriented x person, place, context, mood and behavior normal for situation, pleasant and  cooperative with exam   3. Neurologic: Speech and language are at her normal, face is symmetric, moves all 4 extremities voluntarily, at baseline without acute deficits on limited exam   4. HEENMT:  Head is atraumatic, normocephalic, pupils reactive to light, neck is supple, trachea is midline, mucous membranes are moist, keloid on left ear   5. Respiratory : Lungs are clear to auscultation bilaterally without wheezing, rhonchi, rales, no cyanosis, no increase in work of breathing or accessory muscle use   6. Cardiovascular : Heart rate normal, rhythm is regular, murmur present, rubs or gallops, no peripheral edema, peripheral pulses palpated   7. Gastrointestinal:  Abdomen is soft, nondistended, nontender to palpation bowel sounds active, no masses or organomegaly palpated   8. Skin:  Skin is warm, dry and intact without rashes, acute lesions, or ulcers on limited exam   9.Musculoskeletal:  No acute deformities or trauma, no asymmetry in tone, no peripheral edema, peripheral pulses palpated, no tenderness to palpation in the extremities  Data Reviewed: In the ED Temp 98.5-98.7, heart rate 74-82, respiratory rate 13-20, blood pressure 154/59-209/108, satting 94% No leukocytosis with a white blood cell count of 8.3, hemoglobin 10.3, platelets 229 History reveals a creatinine of 1.76 and BUN of 43 indicating some dehydration CT abdomen and pelvis was done for some abdominal pain that showed slow  transit/constipation.  No bowel obstruction. Chest x-ray showed no active disease EKG showed a heart rate of 76, sinus rhythm, QTc 460 Hydralazine 50 mg 1 tab given in the ED Normal saline bolus given in the ED half a liter Admission requested as patient was not quite back to baseline after initial treatment in the ED, and family was concerned about taking care of her at home  Assessment and Plan: * Acute metabolic encephalopathy - Patient reportedly confused - Patient remembers being incoherent and feeling foolish -Hypertensive encephalopathy could be contributing with the blood pressure as high as 209/108 - Polypharmacy likely also contributing, patient was recently started on baclofen for hand pain. - Holding baclofen, gabapentin, Zoloft as these all could be contributing - Patient reports feeling clearer now - She is alert and oriented to self and place, but gets confused about time still - CT head shows no acute intracranial findings - UA is not indicative of UTI - Chest x-ray shows no active disease - Patient was given hydralazine in the ED 50 mg tablet which brought pressures down to 170s - Initial plan was to monitor the patient on stepdown, patient is now on telemetry floor  Depression - Continue Zoloft  AKI (acute kidney injury) ([place]) - Creatinine at baseline is 1.29 - Today creatinine is 1.76 - Likely secondary to poor p.o. intake and hypertensive crisis - Hold torsemide and lisinopril - Hold other nephrotoxic agents when possible - Half a liter bolus in the ED, continue gentle hydration - Monitor intake and output - Control blood pressure - Trend in the a.m.  Hypertensive crisis - Systolic as high as 712 - Patient has a history of difficult to control blood pressure on 3 antihypertensives at home - Continue Coreg and hydralazine - Holding the setting of AKI - Initial plan was to monitor in stepdown so that if needed, we can escalate to Cardene drip, patient now on  telemetry floor  Essential hypertension - Continue Coreg, hydralazine - Holding lisinopril in the setting of AKI - Monitor on stepdown low threshold to start drip to more tightly control blood pressure  Cerebrovascular disease - Continue Plavix  and statin  Mixed hyperlipidemia - Continue statin      Advance Care Planning:   Code Status: Full Code   Consults: None  Family Communication: No family at bedside  Severity of Illness: The appropriate patient status for this patient is OBSERVATION. Observation status is judged to be reasonable and necessary in order to provide the required intensity of service to ensure the patient's safety. The patient's presenting symptoms, physical exam findings, and initial radiographic and laboratory data in the context of their medical condition is felt to place them at decreased risk for further clinical deterioration. Furthermore, it is anticipated that the patient will be medically stable for discharge from the hospital within 2 midnights of admission.   Author: Rolla Plate, DO 04/11/2022 5:22 AM  For on call review [email or web address].

## 2022-04-11 NOTE — TOC Progression Note (Signed)
Transition of Care [facility]) - Progression Note    Patient Details  Name: Jodi Peters MRN: [id number] Date of Birth: 1929/07/29  Transition of Care [facility]) CM/SW Contact  Salome Arnt, [place] Phone Number: 04/11/2022, 10:36 AM  Clinical Narrative:   Transition of Care [facility]) Screening Note   Patient Details  Name: Jodi Peters Date of Birth: 1930-01-23   Transition of Care [facility]) CM/SW Contact:    Salome Arnt, [place] Phone Number: 04/11/2022, 10:36 AM    Transition of Care Department [facility]) has reviewed patient and no TOC needs have been identified at this time. We will continue to monitor patient advancement through interdisciplinary progression rounds. If new patient transition needs arise, please place a TOC consult.            Expected Discharge Plan and Services                                                 Social Determinants of Health (SDOH) Interventions    Readmission Risk Interventions     No data to display

## 2022-04-12 DIAGNOSIS — N1832 Chronic kidney disease, stage 3b: Secondary | ICD-10-CM

## 2022-04-12 LAB — GLUCOSE, CAPILLARY: Glucose-Capillary: 121 mg/dL — ABNORMAL HIGH (ref 70–99)

## 2022-04-12 LAB — CBC
HCT: 29.7 % — ABNORMAL LOW (ref 36.0–46.0)
Hemoglobin: 9.2 g/dL — ABNORMAL LOW (ref 12.0–15.0)
MCH: 29.5 pg (ref 26.0–34.0)
MCHC: 31 g/dL (ref 30.0–36.0)
MCV: 95.2 fL (ref 80.0–100.0)
Platelets: 201 10*3/uL (ref 150–400)
RBC: 3.12 MIL/uL — ABNORMAL LOW (ref 3.87–5.11)
RDW: 13.9 % (ref 11.5–15.5)
WBC: 5.7 10*3/uL (ref 4.0–10.5)
nRBC: 0 % (ref 0.0–0.2)

## 2022-04-12 LAB — BASIC METABOLIC PANEL
Anion gap: 4 — ABNORMAL LOW (ref 5–15)
BUN: 31 mg/dL — ABNORMAL HIGH (ref 8–23)
CO2: 27 mmol/L (ref 22–32)
Calcium: 9.3 mg/dL (ref 8.9–10.3)
Chloride: 110 mmol/L (ref 98–111)
Creatinine, Ser: 1.26 mg/dL — ABNORMAL HIGH (ref 0.44–1.00)
GFR, Estimated: 41 mL/min — ABNORMAL LOW (ref >60)
Glucose, Bld: 100 mg/dL — ABNORMAL HIGH (ref 70–99)
Potassium: 3.9 mmol/L (ref 3.5–5.1)
Sodium: 141 mmol/L (ref 135–145)

## 2022-04-12 MED ORDER — CARVEDILOL 3.125 MG PO TABS
6.2500 mg | ORAL_TABLET | Freq: Once | ORAL | Status: AC
  Administered 2022-04-12: 6.25 mg via ORAL
  Filled 2022-04-12: qty 2

## 2022-04-12 MED ORDER — CARVEDILOL 3.125 MG PO TABS
3.1250 mg | ORAL_TABLET | Freq: Once | ORAL | Status: AC
  Administered 2022-04-12: 3.125 mg via ORAL
  Filled 2022-04-12: qty 1

## 2022-04-12 MED ORDER — HYDRALAZINE HCL 25 MG PO TABS
100.0000 mg | ORAL_TABLET | Freq: Three times a day (TID) | ORAL | Status: DC
  Administered 2022-04-12 – 2022-04-15 (×9): 100 mg via ORAL
  Filled 2022-04-12 (×9): qty 4

## 2022-04-12 MED ORDER — CARVEDILOL 6.25 MG PO TABS: 6.2500 mg | ORAL_TABLET | Freq: Two times a day (BID) | ORAL | 1 refills | Status: DC

## 2022-04-12 MED ORDER — CARVEDILOL 12.5 MG PO TABS
12.5000 mg | ORAL_TABLET | Freq: Two times a day (BID) | ORAL | Status: DC
  Administered 2022-04-13 – 2022-04-15 (×5): 12.5 mg via ORAL
  Filled 2022-04-12 (×5): qty 1

## 2022-04-12 MED ORDER — SPIRONOLACTONE 12.5 MG HALF TABLET
12.5000 mg | ORAL_TABLET | Freq: Every day | ORAL | Status: DC
  Administered 2022-04-12 – 2022-04-15 (×4): 12.5 mg via ORAL
  Filled 2022-04-12 (×4): qty 1

## 2022-04-12 MED ORDER — CARVEDILOL 3.125 MG PO TABS
6.2500 mg | ORAL_TABLET | Freq: Two times a day (BID) | ORAL | Status: DC
  Administered 2022-04-12: 6.25 mg via ORAL
  Filled 2022-04-12: qty 2

## 2022-04-12 MED ORDER — HYDRALAZINE HCL 20 MG/ML IJ SOLN
10.0000 mg | Freq: Four times a day (QID) | INTRAMUSCULAR | Status: DC | PRN
  Administered 2022-04-12 – 2022-04-14 (×3): 10 mg via INTRAVENOUS
  Filled 2022-04-12 (×3): qty 1

## 2022-04-12 MED ORDER — HYDRALAZINE HCL 100 MG PO TABS: 100.0000 mg | ORAL_TABLET | Freq: Three times a day (TID) | ORAL | 1 refills | Status: DC

## 2022-04-12 NOTE — Progress Notes (Signed)
   04/12/22 0531  Vitals  Temp 98.5 F (36.9 C)  Temp Source Oral  BP (!) 204/78  MAP (mmHg) 111  BP Location Left Arm  BP Method Automatic  Patient Position (if appropriate) Lying  Pulse Rate 70  Resp 14  MEWS COLOR  MEWS Score Color Yellow  Oxygen Therapy  SpO2 99 %  O2 Device Room Air  MEWS Score  MEWS Temp 0  MEWS Systolic 2  MEWS Pulse 0  MEWS RR 0  MEWS LOC 0  MEWS Score 2   Dr. Josephine Cables notified. PRN Hydralazine given

## 2022-04-12 NOTE — Progress Notes (Addendum)
Responded to nursing call:  elevated BP prior to discharge   Subjective: Patient denies fevers, chills, headache, chest pain, dyspnea, nausea, vomiting,   Vitals:   04/12/22 1035 04/12/22 1357 04/12/22 1750 04/12/22 1756  BP: (!) 163/49 (!) 196/63 (!) 204/66 (!) 180/82  Pulse: 72 68    Resp: 18 19    Temp: 98.4 F (36.9 C) 98.3 F (36.8 C)    TempSrc: Oral Oral    SpO2: 98% 97%    Weight:      Height:       CV--RRR Lung--CTA Abd--soft+BS/NT   Assessment/Plan: Malignant HTN -personally rechecked BP 217/61--HR 71 -increase coreg to 12.5 mg bid -add spironolactone -check renin/aldo ratio -cancel discharge -daughter at bedside updated    Orson Eva, DO [facility]

## 2022-04-12 NOTE — Hospital Course (Signed)
88 year old female with history of anxiety, carpal tunnel syndrome, constipation, hypertension, hyperlipidemia, sinus node dysfunction status post PPM presenting with altered mental status. Patient had reported that she was feeling confused, talking incoherently, and feeling foolish.  She reports that she was given a new medication for right hand pain, and that the medication was too strong for her.  Her baclofen was stopped and she was started on IVF for her AKI.  She improved clinically and mental status improved back to baseline.  Her hospital stay was prolonged due to difficult to control HTN.  Nephrology was consulted to assist.

## 2022-04-12 NOTE — TOC Transition Note (Signed)
Transition of Care [facility]) - CM/SW Discharge Note   Patient Details  Name: Jodi Peters MRN: [id number] Date of Birth: [birth year]  Transition of Care [facility]) CM/SW Contact:  Salome Arnt, LCSW Phone Number: 04/12/2022, 3:24 PM   Clinical Narrative:  Pt d/c today. PT recommending home health. Pt defers to daughter, [name] who is agreeable. She states pt most recently had [facility] and requests referral there. [facility] unable to accept pt. Referred and accepted by [facility] with Alvis Lemmings. [name] states pt has also used [facility] in the past and this is okay with her. Requested [facility] place HHPT order.      Final next level of care: [place] Barriers to Discharge: Barriers Resolved   Patient Goals and CMS Choice        Discharge Placement                  Name of family member notified: [name]- daughter in room Patient and family notified of of transfer: 04/12/22  Discharge Plan and [place]: [place]: 04/12/22 Time [place]: 2395 Representative spoke with at [place]: Tommi Rumps  Social Determinants of Health ([place]) Interventions     Readmission Risk Interventions     No data to display

## 2022-04-12 NOTE — Evaluation (Signed)
Physical Therapy Evaluation Patient Details Name: Jodi Peters MRN: [id number] DOB: [birth year] Today's Date: 04/12/2022  History of Present Illness  KOREEN LIZAOLA is a 88 y.o. female with medical history significant of anxiety, carotid atherosclerosis, carpal tunnel syndrome, constipation status post disimpaction in the ED, hyperlipidemia, hypertension, seborrheic keratosis, keloid of the left ear, mitral regurg, and more presents to the ED with a chief complaint of altered mental status.  Patient had reported that she was feeling confused, talking incoherently, and feeling foolish.  She reports that she was given a new medication for right hand pain, and that the medication was too strong for her.  She reports her right hand bothering her all the time including during the exam.  Patient does have a history of carpal tunnel syndrome.  Patient describes the pain in her right hand as sharp pain, 6 out of 10.  Sometimes it makes her crazy to God and yell per her report.  It wakes her up at night.  This is the pain when she was trying to treat with baclofen.  Now the patient is back to her reported baseline mentation, she will likely be able to go home and her medications that can be contributing to altered mental status, aside from baclofen.   Clinical Impression  Patient demonstrates labored movement for sitting up at bedside, very unsteady labored movement with wide base of support walking to bathroom using RW and able to sit up on commode in bathroom after therapy with nurse supervising.  Patient's daughter states family can provide enough help for taking patient home and also have caregivers coming by 4 days/week x 4 hours/day to assist.  PLAN:  Patient to be discharged home today and discharged from acute physical therapy to care of nursing for ambulation as tolerated for length of stay with recommendations stated below      Recommendations for follow up therapy are one component of a multi-disciplinary  discharge planning process, led by the attending physician.  Recommendations may be updated based on patient status, additional functional criteria and insurance authorization.  Follow Up Recommendations Home health PT      Assistance Recommended at Discharge Intermittent Supervision/Assistance  Patient can return home with the following  A little help with bathing/dressing/bathroom;A lot of help with walking and/or transfers;Help with stairs or ramp for entrance;Assistance with cooking/housework    Equipment Recommendations None recommended by PT  Recommendations for Other Services       Functional Status Assessment Patient has had a recent decline in their functional status and demonstrates the ability to make significant improvements in function in a reasonable and predictable amount of time.     Precautions / Restrictions Precautions Precautions: Fall Restrictions Weight Bearing Restrictions: No      Mobility  Bed Mobility Overal bed mobility: Needs Assistance Bed Mobility: Supine to Sit     Supine to sit: Min guard, HOB elevated     General bed mobility comments: increased time, labored movement requiring HOB raised    Transfers Overall transfer level: Needs assistance Equipment used: Rolling walker (2 wheels) Transfers: Sit to/from Stand, Bed to chair/wheelchair/BSC Sit to Stand: Min assist   Step pivot transfers: Min assist       General transfer comment: slow labored movement    Ambulation/Gait Ambulation/Gait assistance: Min assist, Mod assist Gait Distance (Feet): 15 Feet Assistive device: Rolling walker (2 wheels) Gait Pattern/deviations: Decreased step length - right, Decreased step length - left, Decreased stride length, Wide base of support,  Ataxic Gait velocity: decreased     General Gait Details: slow labored cadence with wide base of support and ataxic like movement  Stairs            Wheelchair Mobility    Modified Rankin (Stroke  Patients Only)       Balance Overall balance assessment: Needs assistance Sitting-balance support: Feet supported, No upper extremity supported Sitting balance-Leahy Scale: Fair Sitting balance - Comments: fair/good seated at EOB   Standing balance support: During functional activity, Bilateral upper extremity supported Standing balance-Leahy Scale: Poor Standing balance comment: fair/poor using RW                             Pertinent Vitals/Pain Pain Assessment Pain Assessment: No/denies pain    Home Living Family/patient expects to be discharged to:: Private residence Living Arrangements: Children Available Help at Discharge: Family;Available PRN/intermittently (alone at night) Type of Home: House Home Access: Stairs to enter Entrance Stairs-Rails: None Entrance Stairs-Number of Steps: 1 Alternate Level Stairs-Number of Steps: patient does not go upstairs Home Layout: Two level;Able to live on main level with bedroom/bathroom;Full bath on main level Home Equipment: Rollator (4 wheels);Tub bench      Prior Function Prior Level of Function : Needs assist       Physical Assist : Mobility (physical);ADLs (physical) Mobility (physical): Bed mobility;Transfers;Gait;Stairs   Mobility Comments: Supervised household ambulator using Rollator ADLs Comments: assisted by family and has caregivers 4 days/week x 4 hours/day     Hand Dominance   Dominant Hand: Right    Extremity/Trunk Assessment   Upper Extremity Assessment Upper Extremity Assessment: Overall WFL for tasks assessed    Lower Extremity Assessment Lower Extremity Assessment: Generalized weakness    Cervical / Trunk Assessment Cervical / Trunk Assessment: Normal  Communication   Communication: No difficulties  Cognition Arousal/Alertness: Awake/alert Behavior During Therapy: WFL for tasks assessed/performed Overall Cognitive Status: Within Functional Limits for tasks assessed                                           General Comments      Exercises     Assessment/Plan    PT Assessment All further PT needs can be met in the next venue of care  PT Problem List Decreased strength;Decreased activity tolerance;Decreased balance;Decreased mobility       PT Treatment Interventions      PT Goals (Current goals can be found in the Care Plan section)  Acute Rehab PT Goals Patient Stated Goal: return home with family to assist PT Goal Formulation: With patient/family Time For Goal Achievement: 04/12/22 Potential to Achieve Goals: Good    Frequency       Co-evaluation               AM-PAC PT "6 Clicks" Mobility  Outcome Measure Help needed turning from your back to your side while in a flat bed without using bedrails?: A Little Help needed moving from lying on your back to sitting on the side of a flat bed without using bedrails?: A Little Help needed moving to and from a bed to a chair (including a wheelchair)?: A Lot Help needed standing up from a chair using your arms (e.g., wheelchair or bedside chair)?: A Little Help needed to walk in hospital room?: A Lot Help needed climbing 3-5 steps  with a railing? : A Lot 6 Click Score: 15    End of Session   Activity Tolerance: Patient tolerated treatment well;Patient limited by fatigue Patient left: with call bell/phone within reach;with family/visitor present;Other (comment) (left sitting on commode in bathroom with LPN supervising)   PT Visit Diagnosis: Unsteadiness on feet (R26.81);Other abnormalities of gait and mobility (R26.89);Muscle weakness (generalized) (M62.81)    Time: 5041-3643 PT Time Calculation (min) (ACUTE ONLY): 17 min   Charges:   PT Evaluation $PT Eval Low Complexity: 1 Low PT Treatments $Therapeutic Activity: 8-22 mins        2:24 PM, 04/12/22 Lonell Grandchild, MPT Physical Therapist with [facility] [phone] office [phone] mobile phone

## 2022-04-12 NOTE — Discharge Summary (Signed)
Physician Discharge Summary   Patient: Jodi Peters MRN: [id number] DOB: 25-Sep-1929  Admit date:     04/10/2022  Discharge date: 04/12/22  Discharge Physician: Shanon Brow Zaylon Bossier   PCP: Carrolyn Meiers, MD   Recommendations at discharge:   Please follow up with primary care provider within 1-2 weeks  Please repeat BMP and CBC in one week     Hospital Course: 88 year old female with history of anxiety, carpal tunnel syndrome, constipation, hypertension, hyperlipidemia, sinus node dysfunction status post PPM presenting with altered mental status. Patient had reported that she was feeling confused, talking incoherently, and feeling foolish.  She reports that she was given a new medication for right hand pain, and that the medication was too strong for her.    Assessment and Plan: * Acute metabolic encephalopathy - Patient reportedly confused - Patient remembers being incoherent and feeling foolish -Hypertensive encephalopathy could be contributing with the blood pressure as high as 209/108 - Polypharmacy likely also contributing, patient was recently started on baclofen for hand pain. - Holding baclofen, gabapentin, Zoloft as these all could be contributing - Patient reports feeling clearer now - She is alert and oriented to self and place, but gets confused about time still - CT head shows no acute intracranial findings - UA is not indicative of UTI - Chest x-ray shows no active disease - Patient was given hydralazine in the ED 50 mg tablet which brought pressures down to 170s - Initial plan was to monitor the patient on stepdown, patient is now on telemetry floor  Depression - Hold Zoloft secondary to altered mental status - Plan to restart each of these held meds 1 at the time, likely in the outpatient setting  AKI (acute kidney injury) ([place]) - Creatinine at baseline is 1.29 - Today creatinine is 1.76 - Likely secondary to poor p.o. intake and hypertensive crisis - Hold  torsemide and lisinopril - Hold other nephrotoxic agents when possible - Half a liter bolus in the ED, continue gentle hydration - Monitor intake and output - Control blood pressure - Trend in the a.m.  Hypertensive crisis - Systolic as high as 062 - Patient has a history of difficult to control blood pressure on 3 antihypertensives at home - Continue Coreg and hydralazine - Holding the setting of AKI - Initial plan was to monitor in stepdown so that if needed, we can escalate to Cardene drip, patient now on telemetry floor  Essential hypertension - Continue Coreg, hydralazine - Holding lisinopril in the setting of AKI - Monitor on stepdown low threshold to start drip to more tightly control blood pressure  Cerebrovascular disease - Continue Plavix and statin  Mixed hyperlipidemia - Continue statin         Consultants: none Procedures performed: none  Disposition: Home Diet recommendation:  Cardiac diet DISCHARGE MEDICATION: Allergies as of 04/12/2022       Reactions   Penicillins Rash   Has patient had a PCN reaction causing immediate rash, facial/tongue/throat swelling, SOB or lightheadedness with hypotension: Yes Has patient had a PCN reaction causing severe rash involving mucus membranes or skin necrosis: No Has patient had a PCN reaction that required hospitalization No Has patient had a PCN reaction occurring within the last 10 years: Yes If all of the above answers are "NO", then may proceed with Cephalosporin use.        Medication List     STOP taking these medications    baclofen 10 MG tablet Commonly known as: LIORESAL  benzonatate 100 MG capsule Commonly known as: TESSALON   lisinopril 40 MG tablet Commonly known as: ZESTRIL       TAKE these medications    acetaminophen 500 MG tablet Commonly known as: TYLENOL Take 500 mg by mouth every 6 (six) hours as needed for mild pain or moderate pain.   carvedilol 6.25 MG tablet Commonly  known as: COREG Take 1 tablet (6.25 mg total) by mouth 2 (two) times daily with a meal. What changed:  medication strength how much to take when to take this   clopidogrel 75 MG tablet Commonly known as: PLAVIX Take 1 tablet (75 mg total) by mouth daily.   gabapentin 100 MG capsule Commonly known as: NEURONTIN Take 100 mg by mouth 3 (three) times daily.   hydrALAZINE 100 MG tablet Commonly known as: APRESOLINE Take 1 tablet (100 mg total) by mouth 3 (three) times daily. What changed:  medication strength how much to take   mirtazapine 15 MG tablet Commonly known as: REMERON Take 15 mg by mouth at bedtime.   montelukast 10 MG tablet Commonly known as: SINGULAIR Take 10 mg by mouth daily.   Myrbetriq 50 MG Tb24 tablet Generic drug: mirabegron ER Take 50 mg by mouth daily. What changed: Another medication with the same name was removed. Continue taking this medication, and follow the directions you see here.   sertraline 25 MG tablet Commonly known as: ZOLOFT Take 25 mg by mouth every morning.   simvastatin 20 MG tablet Commonly known as: ZOCOR Take 20 mg by mouth daily at 6 PM.   torsemide 10 MG tablet Commonly known as: DEMADEX Take 10 mg by mouth daily.        Discharge Exam: Filed Weights   04/10/22 1346 04/11/22 0023  Weight: 51.7 kg 53.6 kg   HEENT:  [place]/AT, No thrush, no icterus CV:  RRR, no rub, no S3, no S4 Lung:  CTA, no wheeze, no rhonchi Abd:  soft/+BS, NT Ext:  No edema, no lymphangitis, no synovitis, no rash   Condition at discharge: stable  The results of significant diagnostics from this hospitalization (including imaging, microbiology, ancillary and laboratory) are listed below for reference.   Imaging Studies: CT ABDOMEN PELVIS WO CONTRAST  Result Date: 04/10/2022 CLINICAL DATA:  88 year old with constipation. Patient reports no abdominal pain. EXAM: CT ABDOMEN AND PELVIS WITHOUT CONTRAST TECHNIQUE: Multidetector CT imaging of the  abdomen and pelvis was performed following the standard protocol without IV contrast. RADIATION DOSE REDUCTION: This exam was performed according to the departmental dose-optimization program which includes automated exposure control, adjustment of the mA and/or kV according to patient size and/or use of iterative reconstruction technique. COMPARISON:  Remote contrast-enhanced exam 04/19/2004 FINDINGS: Lower chest: Normal heart size. Pacemaker wires partially included. Subsegmental atelectasis in the left lower lobe. No basilar airspace disease or pleural effusion. Hepatobiliary: No focal liver abnormality is seen. Status post cholecystectomy. No biliary dilatation. Pancreas: No ductal dilatation or inflammation. Spleen: Normal in size without focal abnormality. Adrenals/Urinary Tract: No adrenal nodule. Slight prominence of both renal collecting systems without frank hydronephrosis. Punctate nonobstructing stone in the upper pole of the left kidney. No significant perinephric edema. Left renal cysts. No specific imaging follow-up is needed. Mild bladder distention without wall thickening. Stomach/Bowel: The stomach is decompressed. There is no small bowel obstruction or inflammation. Minimal fecalization of distal small bowel contents. Appendix not confidently visualized, no evidence of appendicitis. Moderate volume of stool throughout the colon. Scattered colonic diverticula, no diverticulitis. No  colonic wall thickening or inflammatory change. Possible small rectocele. Vascular/Lymphatic: Moderate aortic and branch atherosclerosis. No aortic aneurysm. No bulky abdominopelvic adenopathy. Reproductive: The uterus is not seen, presumably surgically absent. There is no adnexal mass. Other: No free air. No ascites. No abdominopelvic collection. Tiny fat containing umbilical hernia. Musculoskeletal: Diffuse degenerative change in the spine. Degenerative change in the pubic symphysis and both hips. There are no acute or  suspicious osseous abnormalities. IMPRESSION: 1. Moderate colonic stool burden with fecalization of distal small bowel contents, suggesting slow transit/constipation. No bowel obstruction or inflammation. Possible small rectocele. 2. Punctate nonobstructing stone in the upper pole of the left kidney. 3. Colonic diverticulosis without diverticulitis. Aortic Atherosclerosis (ICD10-I70.0). Electronically Signed   By: Keith Rake M.D.   On: 04/10/2022 18:21   DG Chest Port 1 View  Result Date: 04/10/2022 CLINICAL DATA:  Altered mental status EXAM: PORTABLE CHEST 1 VIEW COMPARISON:  Chest x-ray 12/20/2009 FINDINGS: Left sided pacemaker is unchanged in position. The heart size and mediastinal contours are within normal limits. Both lungs are clear. The visualized skeletal structures are unremarkable. IMPRESSION: No active disease. Electronically Signed   By: Ronney Asters M.D.   On: 04/10/2022 16:36   CT Head Wo Contrast  Result Date: 04/10/2022 CLINICAL DATA:  Weakness and confusion EXAM: CT HEAD WITHOUT CONTRAST TECHNIQUE: Contiguous axial images were obtained from the base of the skull through the vertex without intravenous contrast. RADIATION DOSE REDUCTION: This exam was performed according to the departmental dose-optimization program which includes automated exposure control, adjustment of the mA and/or kV according to patient size and/or use of iterative reconstruction technique. COMPARISON:  11/10/2020 FINDINGS: Brain: Periventricular white matter and corona radiata hypodensities favor chronic ischemic microvascular white matter disease. Confluence in the right frontal lobe potentially with a small chronic region of remote right frontal lobe infarct on image 17 series 2. This is unchanged from 11/10/2020. Otherwise, the brainstem, cerebellum, cerebral peduncles, thalamus, basal ganglia, basilar cisterns, and ventricular system appear within normal limits. No intracranial hemorrhage, mass lesion, or  acute CVA. Vascular: There is atherosclerotic calcification of the cavernous carotid arteries bilaterally. Skull: Hyperostosis frontalis interna. Sinuses/Orbits: Unremarkable Other: Bulbous expansion of the helix and lobule of the left external ear, similar to previous, possibly from remote trauma or keloid. IMPRESSION: 1. No acute intracranial findings. 2. Periventricular white matter and corona radiata hypodensities favor chronic ischemic microvascular white matter disease. 3. Small region of chronic region of encephalomalacia in the right frontal lobe. 4. Atherosclerosis. 5. Bulbous expansion of the helix and lobule of the left external ear, similar to previous, possibly from remote trauma or keloid. Electronically Signed   By: Van Clines M.D.   On: 04/10/2022 15:05   DG Hand Complete Right  Result Date: 04/04/2022 CLINICAL DATA:  Right hand numbness, initial encounter EXAM: RIGHT HAND - COMPLETE 3+ VIEW COMPARISON:  11/21/2006 FINDINGS: There is no evidence of fracture or dislocation. There is no evidence of arthropathy or other focal bone abnormality. Soft tissues are unremarkable. IMPRESSION: No acute abnormality noted. Electronically Signed   By: Inez Catalina M.D.   On: 04/04/2022 20:45    Microbiology: Results for orders placed or performed during the hospital encounter of 11/10/20  Resp Panel by RT-PCR (Flu A&B, Covid) Nasopharyngeal Swab     Status: None   Collection Time: 11/10/20  9:17 PM   Specimen: Nasopharyngeal Swab; Nasopharyngeal(NP) swabs in vial transport medium  Result Value Ref Range Status   SARS Coronavirus 2 by RT PCR  NEGATIVE NEGATIVE Final    Comment: (NOTE) SARS-CoV-2 target nucleic acids are NOT DETECTED.  The SARS-CoV-2 RNA is generally detectable in upper respiratory specimens during the acute phase of infection. The lowest concentration of SARS-CoV-2 viral copies this assay can detect is 138 copies/mL. A negative result does not preclude  SARS-Cov-2 infection and should not be used as the sole basis for treatment or other patient management decisions. A negative result may occur with  improper specimen collection/handling, submission of specimen other than nasopharyngeal swab, presence of viral mutation(s) within the areas targeted by this assay, and inadequate number of viral copies(<138 copies/mL). A negative result must be combined with clinical observations, patient history, and epidemiological information. The expected result is Negative.  Fact Sheet for Patients:  EntrepreneurPulse.com.au  Fact Sheet for Healthcare Providers:  IncredibleEmployment.be  This test is no t yet approved or cleared by the [place] FDA and  has been authorized for detection and/or diagnosis of SARS-CoV-2 by FDA under an Emergency Use Authorization (EUA). This EUA will remain  in effect (meaning this test can be used) for the duration of the COVID-19 declaration under Section 564(b)(1) of the Act, 21 U.S.C.section 360bbb-3(b)(1), unless the authorization is terminated  or revoked sooner.       Influenza A by PCR NEGATIVE NEGATIVE Final   Influenza B by PCR NEGATIVE NEGATIVE Final    Comment: (NOTE) The Xpert Xpress SARS-CoV-2/FLU/RSV plus assay is intended as an aid in the diagnosis of influenza from Nasopharyngeal swab specimens and should not be used as a sole basis for treatment. Nasal washings and aspirates are unacceptable for Xpert Xpress SARS-CoV-2/FLU/RSV testing.  Fact Sheet for Patients: EntrepreneurPulse.com.au  Fact Sheet for Healthcare Providers: IncredibleEmployment.be  This test is not yet approved or cleared by the [place] FDA and has been authorized for detection and/or diagnosis of SARS-CoV-2 by FDA under an Emergency Use Authorization (EUA). This EUA will remain in effect (meaning this test can be used) for the duration of  the COVID-19 declaration under Section 564(b)(1) of the Act, 21 U.S.C. section 360bbb-3(b)(1), unless the authorization is terminated or revoked.  Performed at [facility], [address], [place], [place] [address]     Labs: CBC: Recent Labs  Lab 04/10/22 1527 04/11/22 0422 04/12/22 0445  WBC 8.3 6.5 5.7  NEUTROABS 5.5 3.4  --   HGB 10.3* 9.9* 9.2*  HCT 32.3* 30.9* 29.7*  MCV 93.9 93.4 95.2  PLT 229 220 831   Basic Metabolic Panel: Recent Labs  Lab 04/10/22 1527 04/11/22 0422 04/12/22 0445  NA 143 143 141  K 3.9 3.7 3.9  CL 108 109 110  CO2 '26 26 27  '$ GLUCOSE 105* 98 100*  BUN 43* 36* 31*  CREATININE 1.76* 1.45* 1.26*  CALCIUM 10.1 9.8 9.3  MG  --  2.3  --    Liver Function Tests: Recent Labs  Lab 04/10/22 1527 04/11/22 0422  AST 23 20  ALT 14 14  ALKPHOS 78 64  BILITOT 0.8 1.1  PROT 7.1 6.5  ALBUMIN 4.1 3.8   CBG: Recent Labs  Lab 04/10/22 1520  GLUCAP 98    Discharge time spent: greater than 30 minutes.  Signed: Orson Eva, MD [facility] 04/12/2022

## 2022-04-12 NOTE — Progress Notes (Signed)
Patient's discharge has been cancelled due to her blood pressure 204/66, was reported to Dr Tat. Family at bedside. Patient was not happy but she said she understood, patient said "I was ready to go home,but I know you guys have to do what's best for me". New orders received and given . Plan of care continued.

## 2022-04-13 DIAGNOSIS — G9340 Encephalopathy, unspecified: Secondary | ICD-10-CM

## 2022-04-13 DIAGNOSIS — M79641 Pain in right hand: Secondary | ICD-10-CM

## 2022-04-13 LAB — BASIC METABOLIC PANEL
Anion gap: 6 (ref 5–15)
BUN: 25 mg/dL — ABNORMAL HIGH (ref 8–23)
CO2: 25 mmol/L (ref 22–32)
Calcium: 9.6 mg/dL (ref 8.9–10.3)
Chloride: 110 mmol/L (ref 98–111)
Creatinine, Ser: 1.16 mg/dL — ABNORMAL HIGH (ref 0.44–1.00)
GFR, Estimated: 45 mL/min — ABNORMAL LOW (ref >60)
Glucose, Bld: 97 mg/dL (ref 70–99)
Potassium: 3.7 mmol/L (ref 3.5–5.1)
Sodium: 141 mmol/L (ref 135–145)

## 2022-04-13 LAB — CORTISOL-AM, BLOOD: Cortisol - AM: 19.4 ug/dL (ref 6.7–22.6)

## 2022-04-13 LAB — TSH: TSH: 2.309 u[IU]/mL (ref 0.350–4.500)

## 2022-04-13 MED ORDER — DICLOFENAC SODIUM 1 % EX GEL
2.0000 g | Freq: Four times a day (QID) | CUTANEOUS | Status: DC
  Administered 2022-04-13 – 2022-04-15 (×4): 2 g via TOPICAL
  Filled 2022-04-13 (×2): qty 100

## 2022-04-13 MED ORDER — LISINOPRIL 10 MG PO TABS
40.0000 mg | ORAL_TABLET | Freq: Every day | ORAL | Status: DC
  Administered 2022-04-13 – 2022-04-15 (×3): 40 mg via ORAL
  Filled 2022-04-13 (×3): qty 4

## 2022-04-13 NOTE — Plan of Care (Signed)

## 2022-04-13 NOTE — Progress Notes (Signed)
PROGRESS NOTE  TEKILA CAILLOUET PRF:[id number] DOB: 04/23/1930 DOA: 04/10/2022 PCP: Carrolyn Meiers, MD  Brief History61  88 year old female with history of anxiety, carpal tunnel syndrome, constipation, hypertension, hyperlipidemia, sinus node dysfunction status post PPM presenting with altered mental status. Patient had reported that she was feeling confused, talking incoherently, and feeling foolish.  She reports that she was given a new medication for right hand pain, and that the medication was too strong for her.  Her baclofen was stopped and she was started on IVF for her AKI.  She improved clinically and mental status improved back to baseline.  Her hospital stay was prolonged due to difficult to control HTN.  Nephrology was consulted to assist.    Assessment and Plan: * Acute metabolic encephalopathy - Patient reportedly confused PTA - Patient remembers being incoherent and feeling foolish -multifactorial including baclofen, AKI, HTN - Polypharmacy likely also contributing, patient was recently started on baclofen for hand pain. - Holding baclofen, gabapentin, Zoloft as these all could be contributing - 04/12/22 A&O x 4 - CT head shows no acute intracranial findings - UA is not indicative of UTI - Chest x-ray shows no active disease -multifactorial due to HTN, acute on chronic renal failure, baclofen  Depression - Hold Zoloft secondary to altered mental status>>restart   Acute renal failure superimposed on stage 3b chronic kidney disease (HCC) - Creatinine baseline 1.1-1.3 - presenting creatinine is 1.76 - due to volume depletion in setting of lisinopril - Hold torsemide and lisinopril - Half a liter bolus in the ED, continue gentle hydration - 04/13/22--serum creatinine 1.16  Essential hypertension - Continue Coreg, hydralazine -increased coreg to 12.5 mg bid and hydralazine 100 mg tid - Holding lisinopril in the setting of AKI -added  spironolactone --checked renin/aldo ratio, but will be affected by spiro -appreciate nephrology consult  Cerebrovascular disease - Continue Plavix and statin  Mixed hyperlipidemia - Continue statin    Family Communication:   son at bedside 11/2  Consultants:  renal  Code Status:  FULL   DVT Prophylaxis:  [place] Heparin    Procedures: As Listed in Progress Note Above  Antibiotics: None       Subjective: Patient denies fevers, chills, headache, chest pain, dyspnea, nausea, vomiting, diarrhea, abdominal pain, dysuria, hematuria, hematochezia, and melena.   Objective: Vitals:   04/13/22 1015 04/13/22 1215 04/13/22 1435 04/13/22 1528  BP: (!) 163/48 (!) 166/52 (!) 168/54 (!) 177/57  Pulse: 64 76 77 64  Resp:   18   Temp:   98.1 F (36.7 C)   TempSrc:   Oral   SpO2:   97%   Weight:      Height:        Intake/Output Summary (Last 24 hours) at 04/13/2022 1721 Last data filed at 04/13/2022 1700 Gross per 24 hour  Intake 2070.7 ml  Output 700 ml  Net 1370.7 ml   Weight change:  Exam:  General:  Pt is alert, follows commands appropriately, not in acute distress HEENT: No icterus, No thrush, No neck mass, [place]/AT Cardiovascular: RRR, S1/S2, no rubs, no gallops Respiratory: CTA bilaterally, no wheezing, no crackles, no rhonchi Abdomen: Soft/+BS, non tender, non distended, no guarding Extremities: No edema, No lymphangitis, No petechiae, No rashes, no synovitis   Data Reviewed: I have personally reviewed following labs and imaging studies Basic Metabolic Panel: Recent Labs  Lab 04/10/22 1527 04/11/22 0422 04/12/22 0445 04/13/22 0436  NA 143 143 141  141  K 3.9 3.7 3.9 3.7  CL 108 109 110 110  CO2 '26 26 27 25  '$ GLUCOSE 105* 98 100* 97  BUN 43* 36* 31* 25*  CREATININE 1.76* 1.45* 1.26* 1.16*  CALCIUM 10.1 9.8 9.3 9.6  MG  --  2.3  --   --    Liver Function Tests: Recent Labs  Lab 04/10/22 1527 04/11/22 0422  AST 23 20  ALT 14 14  ALKPHOS 78 64   BILITOT 0.8 1.1  PROT 7.1 6.5  ALBUMIN 4.1 3.8   No results for input(s): "LIPASE", "AMYLASE" in the last 168 hours. No results for input(s): "AMMONIA" in the last 168 hours. Coagulation Profile: No results for input(s): "INR", "PROTIME" in the last 168 hours. CBC: Recent Labs  Lab 04/10/22 1527 04/11/22 0422 04/12/22 0445  WBC 8.3 6.5 5.7  NEUTROABS 5.5 3.4  --   HGB 10.3* 9.9* 9.2*  HCT 32.3* 30.9* 29.7*  MCV 93.9 93.4 95.2  PLT 229 220 201   Cardiac Enzymes: No results for input(s): "CKTOTAL", "CKMB", "CKMBINDEX", "TROPONINI" in the last 168 hours. BNP: Invalid input(s): "POCBNP" CBG: Recent Labs  Lab 04/10/22 1520 04/12/22 2211  GLUCAP 98 121*   HbA1C: No results for input(s): "HGBA1C" in the last 72 hours. Urine analysis:    Component Value Date/Time   COLORURINE STRAW (A) 04/10/2022 1546   APPEARANCEUR CLEAR 04/10/2022 1546   LABSPEC 1.009 04/10/2022 1546   PHURINE 6.0 04/10/2022 1546   GLUCOSEU NEGATIVE 04/10/2022 1546   HGBUR NEGATIVE 04/10/2022 1546   HGBUR negative 09/03/2008 1019   BILIRUBINUR NEGATIVE 04/10/2022 1546   BILIRUBINUR negative 11/10/2020 [place] 04/10/2022 1546   PROTEINUR NEGATIVE 04/10/2022 1546   UROBILINOGEN 0.2 11/10/2020 1520   UROBILINOGEN 0.2 01/26/2015 1603   NITRITE NEGATIVE 04/10/2022 1546   LEUKOCYTESUR NEGATIVE 04/10/2022 1546   Sepsis Labs: '@LABRCNTIP'$ (procalcitonin:4,lacticidven:4) )No results found for this or any previous visit (from the past 240 hour(s)).   Scheduled Meds:  carvedilol  12.5 mg Oral BID WC   clopidogrel  75 mg Oral Daily   heparin  5,000 Units Subcutaneous Q8H   hydrALAZINE  100 mg Oral TID   lisinopril  40 mg Oral Daily   mirabegron ER  50 mg Oral Daily   montelukast  10 mg Oral Daily   simvastatin  20 mg Oral q1800   spironolactone  12.5 mg Oral Daily   Continuous Infusions:  Procedures/Studies: CT ABDOMEN PELVIS WO CONTRAST  Result Date: 04/10/2022 CLINICAL DATA:   88 year old with constipation. Patient reports no abdominal pain. EXAM: CT ABDOMEN AND PELVIS WITHOUT CONTRAST TECHNIQUE: Multidetector CT imaging of the abdomen and pelvis was performed following the standard protocol without IV contrast. RADIATION DOSE REDUCTION: This exam was performed according to the departmental dose-optimization program which includes automated exposure control, adjustment of the mA and/or kV according to patient size and/or use of iterative reconstruction technique. COMPARISON:  Remote contrast-enhanced exam 04/19/2004 FINDINGS: Lower chest: Normal heart size. Pacemaker wires partially included. Subsegmental atelectasis in the left lower lobe. No basilar airspace disease or pleural effusion. Hepatobiliary: No focal liver abnormality is seen. Status post cholecystectomy. No biliary dilatation. Pancreas: No ductal dilatation or inflammation. Spleen: Normal in size without focal abnormality. Adrenals/Urinary Tract: No adrenal nodule. Slight prominence of both renal collecting systems without frank hydronephrosis. Punctate nonobstructing stone in the upper pole of the left kidney. No significant perinephric edema. Left renal cysts. No specific imaging follow-up is needed. Mild bladder distention without wall  thickening. Stomach/Bowel: The stomach is decompressed. There is no small bowel obstruction or inflammation. Minimal fecalization of distal small bowel contents. Appendix not confidently visualized, no evidence of appendicitis. Moderate volume of stool throughout the colon. Scattered colonic diverticula, no diverticulitis. No colonic wall thickening or inflammatory change. Possible small rectocele. Vascular/Lymphatic: Moderate aortic and branch atherosclerosis. No aortic aneurysm. No bulky abdominopelvic adenopathy. Reproductive: The uterus is not seen, presumably surgically absent. There is no adnexal mass. Other: No free air. No ascites. No abdominopelvic collection. Tiny fat containing  umbilical hernia. Musculoskeletal: Diffuse degenerative change in the spine. Degenerative change in the pubic symphysis and both hips. There are no acute or suspicious osseous abnormalities. IMPRESSION: 1. Moderate colonic stool burden with fecalization of distal small bowel contents, suggesting slow transit/constipation. No bowel obstruction or inflammation. Possible small rectocele. 2. Punctate nonobstructing stone in the upper pole of the left kidney. 3. Colonic diverticulosis without diverticulitis. Aortic Atherosclerosis (ICD10-I70.0). Electronically Signed   By: Keith Rake M.D.   On: 04/10/2022 18:21   DG Chest Port 1 View  Result Date: 04/10/2022 CLINICAL DATA:  Altered mental status EXAM: PORTABLE CHEST 1 VIEW COMPARISON:  Chest x-ray 12/20/2009 FINDINGS: Left sided pacemaker is unchanged in position. The heart size and mediastinal contours are within normal limits. Both lungs are clear. The visualized skeletal structures are unremarkable. IMPRESSION: No active disease. Electronically Signed   By: Ronney Asters M.D.   On: 04/10/2022 16:36   CT Head Wo Contrast  Result Date: 04/10/2022 CLINICAL DATA:  Weakness and confusion EXAM: CT HEAD WITHOUT CONTRAST TECHNIQUE: Contiguous axial images were obtained from the base of the skull through the vertex without intravenous contrast. RADIATION DOSE REDUCTION: This exam was performed according to the departmental dose-optimization program which includes automated exposure control, adjustment of the mA and/or kV according to patient size and/or use of iterative reconstruction technique. COMPARISON:  11/10/2020 FINDINGS: Brain: Periventricular white matter and corona radiata hypodensities favor chronic ischemic microvascular white matter disease. Confluence in the right frontal lobe potentially with a small chronic region of remote right frontal lobe infarct on image 17 series 2. This is unchanged from 11/10/2020. Otherwise, the brainstem, cerebellum,  cerebral peduncles, thalamus, basal ganglia, basilar cisterns, and ventricular system appear within normal limits. No intracranial hemorrhage, mass lesion, or acute CVA. Vascular: There is atherosclerotic calcification of the cavernous carotid arteries bilaterally. Skull: Hyperostosis frontalis interna. Sinuses/Orbits: Unremarkable Other: Bulbous expansion of the helix and lobule of the left external ear, similar to previous, possibly from remote trauma or keloid. IMPRESSION: 1. No acute intracranial findings. 2. Periventricular white matter and corona radiata hypodensities favor chronic ischemic microvascular white matter disease. 3. Small region of chronic region of encephalomalacia in the right frontal lobe. 4. Atherosclerosis. 5. Bulbous expansion of the helix and lobule of the left external ear, similar to previous, possibly from remote trauma or keloid. Electronically Signed   By: Van Clines M.D.   On: 04/10/2022 15:05   DG Hand Complete Right  Result Date: 04/04/2022 CLINICAL DATA:  Right hand numbness, initial encounter EXAM: RIGHT HAND - COMPLETE 3+ VIEW COMPARISON:  11/21/2006 FINDINGS: There is no evidence of fracture or dislocation. There is no evidence of arthropathy or other focal bone abnormality. Soft tissues are unremarkable. IMPRESSION: No acute abnormality noted. Electronically Signed   By: Inez Catalina M.D.   On: 04/04/2022 20:45    Orson Eva, DO  [facility]  If 7PM-7AM, please contact night-coverage [email or web address] Password [facility] 04/13/2022, 5:21 PM  LOS: 2 days

## 2022-04-13 NOTE — Progress Notes (Signed)
   04/13/22 0848  Assess: MEWS Score  BP (!) 226/70  MAP (mmHg) 113  Pulse Rate 70  Level of Consciousness Alert  Assess: MEWS Score  MEWS Temp 0  MEWS Systolic 2  MEWS Pulse 0  MEWS RR 0  MEWS LOC 0  MEWS Score 2  MEWS Score Color Yellow  Assess: if the MEWS score is Yellow or Red  Were vital signs taken at a resting state? Yes  Focused Assessment No change from prior assessment  Does the patient meet 2 or more of the SIRS criteria? No  MEWS guidelines implemented *See Row Information* Yes  Treat  MEWS Interventions Administered scheduled meds/treatments  Pain Scale 0-10  Pain Score 0  Take Vital Signs  Increase Vital Sign Frequency  Yellow: Q 2hr X 2 then Q 4hr X 2, if remains yellow, continue Q 4hrs  Notify: Charge Nurse/RN  Name of Charge Nurse/RN Notified Emiliano Dyer, RN  Date Charge Nurse/RN Notified 04/13/22  Time Charge Nurse/RN Notified 0850  Notify: Provider  Provider Name/Title Dr. Carles Collet  Date Provider Notified 04/13/22  Time Provider Notified [phone]  Method of Notification Page  Notification Reason Other (Comment) (elevated bp)  Provider response No new orders  Document  Patient Outcome Stabilized after interventions  Assess: SIRS CRITERIA  SIRS Temperature  0  SIRS Pulse 0  SIRS Respirations  0  SIRS WBC 1  SIRS Score Sum  1

## 2022-04-13 NOTE — Consult Note (Addendum)
Reason for Consult: Uncontrolled HTN Referring Physician: Tat  Jodi Peters is an 88 y.o. female has a PMH significant for longstanding HTN, chronic diastolic CHF, sinus nodal dysfunction s/p PPM, anxiety, HLD, CTS, and CKD stage IIIa who presented to [facility] ED on 04/10/22 with AMS.  She was also noted to have BP of 190/65.  She was admitted for further evaluation.  She had recently been started on baclofen for hand pain and this was held.  She also had AKI/CKD with Scr of 1.76 and lisinopril and torsemide were held.  Her mentation improved and was about to be discharged yesterday but was cancelled due to spikes in her BP up to 204/66.  Her Scr has returned to baseline.  We were consulted to help manage her labile HTN.    She reports that her BP does go "up and down" over the past year.  There was mention in an EP note about amlodipine, however she was not taking that medication.  Carvedilol was rapidly increased from 3.125 mg bid to 12.5 mg bid over the past 24 hours.  Torsemide and lisinopril continued to be held but she was started on spironolactone 12.5 mg yesterday.  Hydralazine was increased to 100 mg tid yesterday.  Trend in Creatinine: Creatinine, Ser  Date/Time Value Ref Range Status  04/13/2022 04:36 AM 1.16 (H) 0.44 - 1.00 mg/dL Final  04/12/2022 04:45 AM 1.26 (H) 0.44 - 1.00 mg/dL Final  04/11/2022 04:22 AM 1.45 (H) 0.44 - 1.00 mg/dL Final  04/10/2022 03:27 PM 1.76 (H) 0.44 - 1.00 mg/dL Final  11/10/2020 09:56 PM 1.29 (H) 0.44 - 1.00 mg/dL Final  09/17/2015 08:16 PM 0.86 0.44 - 1.00 mg/dL Final  08/30/2015 02:21 PM 1.08 (H) 0.44 - 1.00 mg/dL Final  06/23/2015 06:57 AM 1.02 (H) 0.44 - 1.00 mg/dL Final  06/22/2015 06:00 AM 1.06 (H) 0.44 - 1.00 mg/dL Final  06/21/2015 02:00 PM 1.54 (H) 0.44 - 1.00 mg/dL Final  02/23/2015 09:20 AM 0.99 0.44 - 1.00 mg/dL Final  02/01/2015 06:30 AM 0.92 0.44 - 1.00 mg/dL Final  01/28/2015 07:40 AM 1.00 0.44 - 1.00 mg/dL Final  01/26/2015 03:28 PM 1.50 (H)  0.44 - 1.00 mg/dL Final  01/26/2015 03:10 PM 1.50 (H) 0.44 - 1.00 mg/dL Final  01/18/2015 06:30 AM 0.94 0.44 - 1.00 mg/dL Final  01/12/2015 07:40 AM 0.96 0.44 - 1.00 mg/dL Final  01/04/2015 06:30 AM 1.10 (H) 0.44 - 1.00 mg/dL Final  01/03/2015 01:28 PM 1.30 (H) 0.44 - 1.00 mg/dL Final  12/31/2014 04:10 AM 0.93 0.44 - 1.00 mg/dL Final  12/30/2014 05:40 AM 1.05 (H) 0.44 - 1.00 mg/dL Final  12/28/2014 06:00 AM 0.95 0.44 - 1.00 mg/dL Final  12/27/2014 03:59 AM 1.07 (H) 0.44 - 1.00 mg/dL Final  12/26/2014 03:33 AM 1.02 (H) 0.44 - 1.00 mg/dL Final  12/25/2014 04:03 AM 0.96 0.44 - 1.00 mg/dL Final  12/24/2014 05:05 AM 1.09 (H) 0.44 - 1.00 mg/dL Final  12/22/2014 08:50 PM 1.02 (H) 0.44 - 1.00 mg/dL Final  12/16/2014 04:22 PM 1.04 (H) 0.44 - 1.00 mg/dL Final  02/18/2014 09:39 PM 0.90 0.50 - 1.10 mg/dL Final  03/16/2010 07:31 PM 1.33 (H) 0.40 - 1.20 mg/dL Final  02/22/2010 01:20 PM 1.24 (H) 0.4 - 1.2 mg/dL Final  09/30/2009 12:17 AM 1.32 (H) 0.4 - 1.2 mg/dL Final  12/24/2008 08:20 PM 1.24 (H) 0.40 - 1.20 mg/dL Final  12/24/2008 12:00 AM 1.24 mg/dL   07/08/2008 10:29 PM 1.09 0.40 - 1.20 mg/dL Final  12/25/2007 12:02 AM  1.08 0.40 - 1.20 mg/dL Final  10/09/2007 03:42 PM 1.13  Final  07/09/2007 10:56 AM 1.05  Final  05/27/2007 09:10 PM 0.94 0.40 - 1.20 mg/dL Final  10/23/2006 07:08 PM 0.97 0.40 - 1.20 mg/dL Final    PMH:   Past Medical History:  Diagnosis Date   Allergic rhinitis    Anxiety    Bradycardia    Breast cancer (HCC)    Breast mass    left   Breast pain    leftr   Carotid atherosclerosis    Carpal tunnel syndrome    Cataract    Constipation    Cystocele 06/30/2014   Glaucoma    Hyperlipidemia    Hypertension    IBS (irritable bowel syndrome)    Low back pain    Mitral regurgitation    sinus bradycardia   Osteoarthritis    Osteopenia    Rectocele 06/30/2014   Seborrheic keratosis    Venous insufficiency    Vertigo    Vitamin D deficiency     PSH:   Past  Surgical History:  Procedure Laterality Date   ABDOMINAL HYSTERECTOMY     APPENDECTOMY     BACK SURGERY     BREAST LUMPECTOMY     CHOLECYSTECTOMY     cyst removed from left breast     EP IMPLANTABLE DEVICE N/A 08/30/2015   Procedure: Pacemaker Implant;  Surgeon: Evans Lance, MD;  Location: [facility];  Service: Cardiovascular;  Laterality: N/A;   EYE SURGERY     for bleed in left eye   keloid     left ear   LUMBAR LAMINECTOMY/DECOMPRESSION MICRODISCECTOMY N/A 12/25/2014   Procedure: Thoracic eleven-twelve Laminectomy/Diskectomy; Lumbar three-four Laminectomy/Diskectomy;  Surgeon: Kristeen Miss, MD;  Location: [facility];  Service: Neurosurgery;  Laterality: N/A;   spurs on back      Allergies:  Allergies  Allergen Reactions   Penicillins Rash    Has patient had a PCN reaction causing immediate rash, facial/tongue/throat swelling, SOB or lightheadedness with hypotension: Yes Has patient had a PCN reaction causing severe rash involving mucus membranes or skin necrosis: No Has patient had a PCN reaction that required hospitalization No Has patient had a PCN reaction occurring within the last 10 years: Yes If all of the above answers are "NO", then may proceed with Cephalosporin use.    Medications:   Prior to Admission medications   Medication Sig Start Date End Date Taking? Authorizing Provider  acetaminophen (TYLENOL) 500 MG tablet Take 500 mg by mouth every 6 (six) hours as needed for mild pain or moderate pain.   Yes [provider]  baclofen (LIORESAL) 10 MG tablet Take 10 mg by mouth at bedtime. 04/05/22  Yes [provider]  carvedilol (COREG) 3.125 MG tablet TAKE 1 TABLET BY MOUTH 2 TIMES DAILY. 10/31/21  Yes Evans Lance, MD  clopidogrel (PLAVIX) 75 MG tablet Take 1 tablet (75 mg total) by mouth daily. 06/23/15  Yes Fanta, Normajean Baxter, MD  gabapentin (NEURONTIN) 100 MG capsule Take 100 mg by mouth 3 (three) times daily. 03/28/22  Yes  [provider]  hydrALAZINE (APRESOLINE) 50 MG tablet Take 50 mg by mouth 3 (three) times daily. 11/04/20  Yes [provider]  lisinopril (PRINIVIL,ZESTRIL) 40 MG tablet Take 40 mg by mouth daily. 11/25/15  Yes [provider]  mirtazapine (REMERON) 15 MG tablet Take 15 mg by mouth at bedtime.   Yes [provider]  montelukast (SINGULAIR) 10  MG tablet Take 10 mg by mouth daily. 03/17/22  Yes [provider]  MYRBETRIQ 50 MG TB24 tablet Take 50 mg by mouth daily. 07/14/21  Yes [provider]  sertraline (ZOLOFT) 25 MG tablet Take 25 mg by mouth every morning. 07/06/21  Yes [provider]  simvastatin (ZOCOR) 20 MG tablet Take 20 mg by mouth daily at 6 PM.  07/13/15  Yes [provider]  torsemide (DEMADEX) 10 MG tablet Take 10 mg by mouth daily. 08/08/21  Yes [provider]  benzonatate (TESSALON) 100 MG capsule Take 100 mg by mouth 3 (three) times daily as needed. Patient not taking: Reported on 04/10/2022 06/08/21   [provider]  carvedilol (COREG) 6.25 MG tablet Take 1 tablet (6.25 mg total) by mouth 2 (two) times daily with a meal. 04/12/22   Tat, Shanon Brow, MD  hydrALAZINE (APRESOLINE) 100 MG tablet Take 1 tablet (100 mg total) by mouth 3 (three) times daily. 04/12/22   Orson Eva, MD  MYRBETRIQ 25 MG TB24 tablet Take 25 mg by mouth daily. Patient not taking: Reported on 04/10/2022 01/12/22   [provider]    Inpatient medications:  carvedilol  12.5 mg Oral BID WC   clopidogrel  75 mg Oral Daily   heparin  5,000 Units Subcutaneous Q8H   hydrALAZINE  100 mg Oral TID   mirabegron ER  50 mg Oral Daily   montelukast  10 mg Oral Daily   simvastatin  20 mg Oral q1800   spironolactone  12.5 mg Oral Daily    Discontinued Meds:   Medications Discontinued During This Encounter  Medication Reason   spironolactone (ALDACTONE) 25 MG tablet Patient Preference   Simethicone (GAS-X PO) Patient Preference    Polyethyl Glycol-Propyl Glycol (SYSTANE OP) Patient Preference   pilocarpine (PILOCAR) 4 % ophthalmic solution Patient Preference   oxybutynin (DITROPAN-XL) 10 MG 24 hr tablet Patient Preference   latanoprost (XALATAN) 0.005 % ophthalmic solution Patient Preference   brimonidine (ALPHAGAN) 0.2 % ophthalmic solution Patient Preference   betamethasone dipropionate 0.05 % cream Patient Preference   dorzolamide-timolol (COSOPT) 22.3-6.8 MG/ML ophthalmic solution Completed Course   oxyCODONE (Oxy IR/ROXICODONE) immediate release tablet 5 mg    carvedilol (COREG) tablet 3.125 mg    hydrALAZINE (APRESOLINE) tablet 50 mg    carvedilol (COREG) tablet 6.25 mg    hydrALAZINE (APRESOLINE) injection 10 mg Duplicate    Social History:  reports that she has never smoked. She has never used smokeless tobacco. She reports that she does not drink alcohol and does not use drugs.  Family History:   Family History  Problem Relation Age of Onset   Heart disease Mother    Hypertension Mother    Diabetes Mother    Other Daughter        left breast nodule   Hypertension Son    Hyperlipidemia Son    Heart disease Maternal Grandmother    Hypertension Maternal Grandmother    Hypertension Son    Other Son        boils    A comprehensive review of systems was negative. Weight change:   Intake/Output Summary (Last 24 hours) at 04/13/2022 0920 Last data filed at 04/13/2022 0602 Gross per 24 hour  Intake 1917.58 ml  Output 700 ml  Net 1217.58 ml   BP (!) 226/70 (BP Location: Left Arm)   Pulse 70   Temp 99 F (37.2 C) (Oral)   Resp 16   Ht '5\' 4"'$  (1.626 m)  Wt 53.6 kg   SpO2 96%   BMI 20.28 kg/m  Vitals:   04/12/22 2050 04/13/22 0001 04/13/22 0440 04/13/22 0848  BP: (!) 180/57 (!) 161/45 (!) 165/49 (!) 226/70  Pulse: 65 70 72 70  Resp: '18 18 16   '$ Temp: 98.1 F (36.7 C) 98.6 F (37 C) 99 F (37.2 C)   TempSrc: Oral Oral Oral   SpO2: 99% 97% 96%   Weight:      Height:         General  appearance: alert, cooperative, fatigued, and no distress Head: Normocephalic, without obvious abnormality, atraumatic Resp: clear to auscultation bilaterally Cardio: regular rate and rhythm, S1, S2 normal, no murmur, click, rub or gallop GI: soft, non-tender; bowel sounds normal; no masses,  no organomegaly Extremities: extremities normal, atraumatic, no cyanosis or edema  Labs: Basic Metabolic Panel: Recent Labs  Lab 04/10/22 1527 04/11/22 0422 04/12/22 0445 04/13/22 0436  NA 143 143 141 141  K 3.9 3.7 3.9 3.7  CL 108 109 110 110  CO2 '26 26 27 25  '$ GLUCOSE 105* 98 100* 97  BUN 43* 36* 31* 25*  CREATININE 1.76* 1.45* 1.26* 1.16*  ALBUMIN 4.1 3.8  --   --   CALCIUM 10.1 9.8 9.3 9.6   Liver Function Tests: Recent Labs  Lab 04/10/22 1527 04/11/22 0422  AST 23 20  ALT 14 14  ALKPHOS 78 64  BILITOT 0.8 1.1  PROT 7.1 6.5  ALBUMIN 4.1 3.8   No results for input(s): "LIPASE", "AMYLASE" in the last 168 hours. No results for input(s): "AMMONIA" in the last 168 hours. CBC: Recent Labs  Lab 04/10/22 1527 04/11/22 0422 04/12/22 0445  WBC 8.3 6.5 5.7  NEUTROABS 5.5 3.4  --   HGB 10.3* 9.9* 9.2*  HCT 32.3* 30.9* 29.7*  MCV 93.9 93.4 95.2  PLT 229 220 201   PT/INR: '@LABRCNTIP'$ (inr:5) Cardiac Enzymes: )No results for input(s): "CKTOTAL", "CKMB", "CKMBINDEX", "TROPONINI" in the last 168 hours. CBG: Recent Labs  Lab 04/10/22 1520 04/12/22 2211  GLUCAP 98 121*    Iron Studies: No results for input(s): "IRON", "TIBC", "TRANSFERRIN", "FERRITIN" in the last 168 hours.  Xrays/Other Studies: No results found.   Assessment/Plan:  Labile HTN - home regimen had been lisinopril 40 mg, carvedilol 3.125 mg bid, hydralazine 50 mg tid, and torsemide 10 mg daily.  Lisinopril and torsemide held due to AKI.  Many changes to medications on 04/12/22:  Carvedilol was increased to 12.5 mg bid, hydralazine increased to 100 mg tid, and started on spironolactone 12.5 mg.  Will start  secondary HTN workup, however the recent addition of spironolactone will affect results.  Would check renal artery duplex but can't be done as an inpatient here.  She would benefit from longer acting agents.  Will restart lisinopril and follow K and Scr while she remains an inpatient.  I would prefer starting amlodipine, however she had recent stool disimpaction and has chronic constipation.  Will continue to follow BP readings.   AMS - likely due to baclofen.  Has improved since stopping it. AKI/CKD - improved after holding torsemide and lisinopril CKD stage IIIa - Scr back to baseline.  Presumably due to hypertensive nephrosclerosis.  Had CT scan of abdomen 10/30 which showed prominence of both renal collecting systems without frank hydronephrosis and nonobstructing stone in the upper pole of left kidney.  Has not seen nephrology before.  Will arrange for outpatient follow up after discharge.  Sinus node dysfunction - s/p PPM Chronic  diastolic CHF- appears euvolemic. Anemia of CKD stage III - hold off on ESA given HTN.  Will check iron stores and follow.   Broadus John A Myrtice Lowdermilk 04/13/2022, 9:20 AM

## 2022-04-14 LAB — CBC
HCT: 30.6 % — ABNORMAL LOW (ref 36.0–46.0)
Hemoglobin: 9.7 g/dL — ABNORMAL LOW (ref 12.0–15.0)
MCH: 29.7 pg (ref 26.0–34.0)
MCHC: 31.7 g/dL (ref 30.0–36.0)
MCV: 93.6 fL (ref 80.0–100.0)
Platelets: 213 10*3/uL (ref 150–400)
RBC: 3.27 MIL/uL — ABNORMAL LOW (ref 3.87–5.11)
RDW: 13.8 % (ref 11.5–15.5)
WBC: 6.8 10*3/uL (ref 4.0–10.5)
nRBC: 0 % (ref 0.0–0.2)

## 2022-04-14 LAB — RENAL FUNCTION PANEL
Albumin: 3.8 g/dL (ref 3.5–5.0)
Anion gap: 7 (ref 5–15)
BUN: 23 mg/dL (ref 8–23)
CO2: 25 mmol/L (ref 22–32)
Calcium: 10.1 mg/dL (ref 8.9–10.3)
Chloride: 110 mmol/L (ref 98–111)
Creatinine, Ser: 1.29 mg/dL — ABNORMAL HIGH (ref 0.44–1.00)
GFR, Estimated: 39 mL/min — ABNORMAL LOW (ref >60)
Glucose, Bld: 100 mg/dL — ABNORMAL HIGH (ref 70–99)
Phosphorus: 2.9 mg/dL (ref 2.5–4.6)
Potassium: 4 mmol/L (ref 3.5–5.1)
Sodium: 142 mmol/L (ref 135–145)

## 2022-04-14 LAB — IRON AND TIBC
Iron: 63 ug/dL (ref 28–170)
Saturation Ratios: 27 % (ref 10.4–31.8)
TIBC: 231 ug/dL — ABNORMAL LOW (ref 250–450)
UIBC: 168 ug/dL

## 2022-04-14 MED ORDER — SERTRALINE HCL 50 MG PO TABS
25.0000 mg | ORAL_TABLET | Freq: Every day | ORAL | Status: DC
  Administered 2022-04-15: 25 mg via ORAL
  Filled 2022-04-14: qty 1

## 2022-04-14 MED ORDER — POLYETHYLENE GLYCOL 3350 17 G PO PACK
17.0000 g | PACK | Freq: Every day | ORAL | Status: DC
  Administered 2022-04-14 – 2022-04-15 (×2): 17 g via ORAL
  Filled 2022-04-14 (×2): qty 1

## 2022-04-14 MED ORDER — SENNA 8.6 MG PO TABS
2.0000 | ORAL_TABLET | Freq: Every day | ORAL | Status: DC
  Administered 2022-04-14 – 2022-04-15 (×2): 17.2 mg via ORAL
  Filled 2022-04-14 (×2): qty 2

## 2022-04-14 NOTE — Progress Notes (Signed)
PROGRESS NOTE  Jodi Peters UXN:[id number] DOB: 1929/12/12 DOA: 04/10/2022 PCP: Carrolyn Meiers, MD  Brief History28  88 year old female with history of anxiety, carpal tunnel syndrome, constipation, hypertension, hyperlipidemia, sinus node dysfunction status post PPM presenting with altered mental status. Patient had reported that she was feeling confused, talking incoherently, and feeling foolish.  She reports that she was given a new medication for right hand pain, and that the medication was too strong for her.  Her baclofen was stopped and she was started on IVF for her AKI.  She improved clinically and mental status improved back to baseline.  Her hospital stay was prolonged due to difficult to control HTN.  Nephrology was consulted to assist.     Assessment and Plan: * Acute metabolic encephalopathy - Patient reportedly confused PTA - Patient remembers being incoherent and feeling foolish -multifactorial including baclofen, AKI, HTN - Polypharmacy likely also contributing, patient was recently started on baclofen for hand pain. - Holding baclofen, gabapentin, Zoloft as these all could be contributing - 04/12/22 A&O x 4 - CT head shows no acute intracranial findings - UA is not indicative of UTI - Chest x-ray shows no active disease -multifactorial due to HTN, acute on chronic renal failure, baclofen  Depression - Hold Zoloft secondary to altered mental status>>restart   Acute renal failure superimposed on stage 3b chronic kidney disease (HCC) - Creatinine baseline 1.1-1.3 - presenting creatinine is 1.76 - due to volume depletion in setting of lisinopril - Hold torsemide and lisinopril - Half a liter bolus in the ED, continue gentle hydration - 04/13/22--serum creatinine 1.16  Essential hypertension - Continue Coreg, hydralazine -increased coreg to 12.5 mg bid and hydralazine 100 mg tid - Holding lisinopril in the setting of AKI -added  spironolactone --checked renin/aldo ratio, but will be affected by spiro -appreciate nephrology consult  Cerebrovascular disease - Continue Plavix and statin  Mixed hyperlipidemia - Continue statin         Family Communication:   son and daughter at bedside 11/3  Consultants:  renal  Code Status:  FULL   DVT Prophylaxis:  [place] Heparin    Procedures: As Listed in Progress Note Above  Antibiotics: None       Subjective: Patient denies fevers, chills, headache, chest pain, dyspnea, nausea, vomiting, diarrhea, abdominal pain, dysuria, hematuria, hematochezia, and melena.   Objective: Vitals:   04/14/22 0534 04/14/22 0547 04/14/22 0551 04/14/22 1252  BP: (!) 196/69 (!) 186/71 (!) 181/59 (!) 187/64  Pulse:    72  Resp: '16 16  16  '$ Temp: 98.8 F (37.1 C)   97.9 F (36.6 C)  TempSrc: Oral   Oral  SpO2:    99%  Weight:      Height:        Intake/Output Summary (Last 24 hours) at 04/14/2022 1710 Last data filed at 04/14/2022 0900 Gross per 24 hour  Intake 360 ml  Output 200 ml  Net 160 ml   Weight change:  Exam:  General:  Pt is alert, follows commands appropriately, not in acute distress HEENT: No icterus, No thrush, No neck mass, [place]/AT Cardiovascular: RRR, S1/S2, no rubs, no gallops Respiratory: CTA bilaterally, no wheezing, no crackles, no rhonchi Abdomen: Soft/+BS, non tender, non distended, no guarding Extremities: No edema, No lymphangitis, No petechiae, No rashes, no synovitis   Data Reviewed: I have personally reviewed following labs and imaging studies Basic Metabolic Panel: Recent Labs  Lab 04/10/22 1527  04/11/22 0422 04/12/22 0445 04/13/22 0436 04/14/22 0430  NA 143 143 141 141 142  K 3.9 3.7 3.9 3.7 4.0  CL 108 109 110 110 110  CO2 '26 26 27 25 25  '$ GLUCOSE 105* 98 100* 97 100*  BUN 43* 36* 31* 25* 23  CREATININE 1.76* 1.45* 1.26* 1.16* 1.29*  CALCIUM 10.1 9.8 9.3 9.6 10.1  MG  --  2.3  --   --   --   PHOS  --   --   --   --  2.9    Liver Function Tests: Recent Labs  Lab 04/10/22 1527 04/11/22 0422 04/14/22 0430  AST 23 20  --   ALT 14 14  --   ALKPHOS 78 64  --   BILITOT 0.8 1.1  --   PROT 7.1 6.5  --   ALBUMIN 4.1 3.8 3.8   No results for input(s): "LIPASE", "AMYLASE" in the last 168 hours. No results for input(s): "AMMONIA" in the last 168 hours. Coagulation Profile: No results for input(s): "INR", "PROTIME" in the last 168 hours. CBC: Recent Labs  Lab 04/10/22 1527 04/11/22 0422 04/12/22 0445 04/14/22 0430  WBC 8.3 6.5 5.7 6.8  NEUTROABS 5.5 3.4  --   --   HGB 10.3* 9.9* 9.2* 9.7*  HCT 32.3* 30.9* 29.7* 30.6*  MCV 93.9 93.4 95.2 93.6  PLT 229 220 201 213   Cardiac Enzymes: No results for input(s): "CKTOTAL", "CKMB", "CKMBINDEX", "TROPONINI" in the last 168 hours. BNP: Invalid input(s): "POCBNP" CBG: Recent Labs  Lab 04/10/22 1520 04/12/22 2211  GLUCAP 98 121*   HbA1C: No results for input(s): "HGBA1C" in the last 72 hours. Urine analysis:    Component Value Date/Time   COLORURINE STRAW (A) 04/10/2022 1546   APPEARANCEUR CLEAR 04/10/2022 1546   LABSPEC 1.009 04/10/2022 1546   PHURINE 6.0 04/10/2022 1546   GLUCOSEU NEGATIVE 04/10/2022 1546   HGBUR NEGATIVE 04/10/2022 1546   HGBUR negative 09/03/2008 1019   BILIRUBINUR NEGATIVE 04/10/2022 1546   BILIRUBINUR negative 11/10/2020 [place] 04/10/2022 1546   PROTEINUR NEGATIVE 04/10/2022 1546   UROBILINOGEN 0.2 11/10/2020 1520   UROBILINOGEN 0.2 01/26/2015 1603   NITRITE NEGATIVE 04/10/2022 1546   LEUKOCYTESUR NEGATIVE 04/10/2022 1546   Sepsis Labs: '@LABRCNTIP'$ (procalcitonin:4,lacticidven:4) )No results found for this or any previous visit (from the past 240 hour(s)).   Scheduled Meds:  carvedilol  12.5 mg Oral BID WC   clopidogrel  75 mg Oral Daily   diclofenac Sodium  2 g Topical QID   heparin  5,000 Units Subcutaneous Q8H   hydrALAZINE  100 mg Oral TID   lisinopril  40 mg Oral Daily   mirabegron ER  50  mg Oral Daily   montelukast  10 mg Oral Daily   simvastatin  20 mg Oral q1800   spironolactone  12.5 mg Oral Daily   Continuous Infusions:  Procedures/Studies: CT ABDOMEN PELVIS WO CONTRAST  Result Date: 04/10/2022 CLINICAL DATA:  88 year old with constipation. Patient reports no abdominal pain. EXAM: CT ABDOMEN AND PELVIS WITHOUT CONTRAST TECHNIQUE: Multidetector CT imaging of the abdomen and pelvis was performed following the standard protocol without IV contrast. RADIATION DOSE REDUCTION: This exam was performed according to the departmental dose-optimization program which includes automated exposure control, adjustment of the mA and/or kV according to patient size and/or use of iterative reconstruction technique. COMPARISON:  Remote contrast-enhanced exam 04/19/2004 FINDINGS: Lower chest: Normal heart size. Pacemaker wires partially included. Subsegmental atelectasis in the left lower lobe. No  basilar airspace disease or pleural effusion. Hepatobiliary: No focal liver abnormality is seen. Status post cholecystectomy. No biliary dilatation. Pancreas: No ductal dilatation or inflammation. Spleen: Normal in size without focal abnormality. Adrenals/Urinary Tract: No adrenal nodule. Slight prominence of both renal collecting systems without frank hydronephrosis. Punctate nonobstructing stone in the upper pole of the left kidney. No significant perinephric edema. Left renal cysts. No specific imaging follow-up is needed. Mild bladder distention without wall thickening. Stomach/Bowel: The stomach is decompressed. There is no small bowel obstruction or inflammation. Minimal fecalization of distal small bowel contents. Appendix not confidently visualized, no evidence of appendicitis. Moderate volume of stool throughout the colon. Scattered colonic diverticula, no diverticulitis. No colonic wall thickening or inflammatory change. Possible small rectocele. Vascular/Lymphatic: Moderate aortic and branch  atherosclerosis. No aortic aneurysm. No bulky abdominopelvic adenopathy. Reproductive: The uterus is not seen, presumably surgically absent. There is no adnexal mass. Other: No free air. No ascites. No abdominopelvic collection. Tiny fat containing umbilical hernia. Musculoskeletal: Diffuse degenerative change in the spine. Degenerative change in the pubic symphysis and both hips. There are no acute or suspicious osseous abnormalities. IMPRESSION: 1. Moderate colonic stool burden with fecalization of distal small bowel contents, suggesting slow transit/constipation. No bowel obstruction or inflammation. Possible small rectocele. 2. Punctate nonobstructing stone in the upper pole of the left kidney. 3. Colonic diverticulosis without diverticulitis. Aortic Atherosclerosis (ICD10-I70.0). Electronically Signed   By: Keith Rake M.D.   On: 04/10/2022 18:21   DG Chest Port 1 View  Result Date: 04/10/2022 CLINICAL DATA:  Altered mental status EXAM: PORTABLE CHEST 1 VIEW COMPARISON:  Chest x-ray 12/20/2009 FINDINGS: Left sided pacemaker is unchanged in position. The heart size and mediastinal contours are within normal limits. Both lungs are clear. The visualized skeletal structures are unremarkable. IMPRESSION: No active disease. Electronically Signed   By: Ronney Asters M.D.   On: 04/10/2022 16:36   CT Head Wo Contrast  Result Date: 04/10/2022 CLINICAL DATA:  Weakness and confusion EXAM: CT HEAD WITHOUT CONTRAST TECHNIQUE: Contiguous axial images were obtained from the base of the skull through the vertex without intravenous contrast. RADIATION DOSE REDUCTION: This exam was performed according to the departmental dose-optimization program which includes automated exposure control, adjustment of the mA and/or kV according to patient size and/or use of iterative reconstruction technique. COMPARISON:  11/10/2020 FINDINGS: Brain: Periventricular white matter and corona radiata hypodensities favor chronic  ischemic microvascular white matter disease. Confluence in the right frontal lobe potentially with a small chronic region of remote right frontal lobe infarct on image 17 series 2. This is unchanged from 11/10/2020. Otherwise, the brainstem, cerebellum, cerebral peduncles, thalamus, basal ganglia, basilar cisterns, and ventricular system appear within normal limits. No intracranial hemorrhage, mass lesion, or acute CVA. Vascular: There is atherosclerotic calcification of the cavernous carotid arteries bilaterally. Skull: Hyperostosis frontalis interna. Sinuses/Orbits: Unremarkable Other: Bulbous expansion of the helix and lobule of the left external ear, similar to previous, possibly from remote trauma or keloid. IMPRESSION: 1. No acute intracranial findings. 2. Periventricular white matter and corona radiata hypodensities favor chronic ischemic microvascular white matter disease. 3. Small region of chronic region of encephalomalacia in the right frontal lobe. 4. Atherosclerosis. 5. Bulbous expansion of the helix and lobule of the left external ear, similar to previous, possibly from remote trauma or keloid. Electronically Signed   By: Van Clines M.D.   On: 04/10/2022 15:05   DG Hand Complete Right  Result Date: 04/04/2022 CLINICAL DATA:  Right hand numbness, initial encounter  EXAM: RIGHT HAND - COMPLETE 3+ VIEW COMPARISON:  11/21/2006 FINDINGS: There is no evidence of fracture or dislocation. There is no evidence of arthropathy or other focal bone abnormality. Soft tissues are unremarkable. IMPRESSION: No acute abnormality noted. Electronically Signed   By: Inez Catalina M.D.   On: 04/04/2022 20:45    Orson Eva, DO  [facility]  If 7PM-7AM, please contact night-coverage [email or web address] Password [email or web address] 04/14/2022, 5:10 PM   LOS: 3 days

## 2022-04-14 NOTE — Care Management Important Message (Signed)
Important Message  Patient Details  Name: Jodi Peters MRN: [id number] Date of Birth: Jul 23, 1929   Medicare Important Message Given:  Yes     Tommy Medal 04/14/2022, 12:22 PM

## 2022-04-14 NOTE — Progress Notes (Signed)
Patient requested IV to be removed.   IV removed  due to patient request. Explained importance of iv and is usually left in until patient discharged. Patient still requested IV to be removed. Family member present.

## 2022-04-14 NOTE — Progress Notes (Signed)
Patient ID: Jodi Peters, female   DOB: Jul 07, 1929, 88 y.o.   MRN: [id number] S: Feels well, no new complaints.  No HA, chest pain, SOB, edema, orthopnea, or PND O:BP (!) 181/59 (BP Location: Right Arm)   Pulse 65   Temp 98.8 F (37.1 C) (Oral)   Resp 16   Ht '5\' 4"'$  (1.626 m)   Wt 53.6 kg   SpO2 98%   BMI 20.28 kg/m   Intake/Output Summary (Last 24 hours) at 04/14/2022 0901 Last data filed at 04/13/2022 1900 Gross per 24 hour  Intake 273.12 ml  Output 200 ml  Net 73.12 ml   Intake/Output: I/O last 3 completed shifts: In: 2190.7 [P.O.:560; I.V.:1630.7] Out: 900 [Urine:900]  Intake/Output this shift:  No intake/output data recorded. Weight change:  Gen:NAD CVS: RRR Resp:CTA Abd: +BS, soft,NT/ND Ext: no edema  Recent Labs  Lab 04/10/22 1527 04/11/22 0422 04/12/22 0445 04/13/22 0436 04/14/22 0430  NA 143 143 141 141 142  K 3.9 3.7 3.9 3.7 4.0  CL 108 109 110 110 110  CO2 '26 26 27 25 25  '$ GLUCOSE 105* 98 100* 97 100*  BUN 43* 36* 31* 25* 23  CREATININE 1.76* 1.45* 1.26* 1.16* 1.29*  ALBUMIN 4.1 3.8  --   --  3.8  CALCIUM 10.1 9.8 9.3 9.6 10.1  PHOS  --   --   --   --  2.9  AST 23 20  --   --   --   ALT 14 14  --   --   --    Liver Function Tests: Recent Labs  Lab 04/10/22 1527 04/11/22 0422 04/14/22 0430  AST 23 20  --   ALT 14 14  --   ALKPHOS 78 64  --   BILITOT 0.8 1.1  --   PROT 7.1 6.5  --   ALBUMIN 4.1 3.8 3.8   No results for input(s): "LIPASE", "AMYLASE" in the last 168 hours. No results for input(s): "AMMONIA" in the last 168 hours. CBC: Recent Labs  Lab 04/10/22 1527 04/11/22 0422 04/12/22 0445 04/14/22 0430  WBC 8.3 6.5 5.7 6.8  NEUTROABS 5.5 3.4  --   --   HGB 10.3* 9.9* 9.2* 9.7*  HCT 32.3* 30.9* 29.7* 30.6*  MCV 93.9 93.4 95.2 93.6  PLT 229 220 201 213   Cardiac Enzymes: No results for input(s): "CKTOTAL", "CKMB", "CKMBINDEX", "TROPONINI" in the last 168 hours. CBG: Recent Labs  Lab 04/10/22 1520 04/12/22 2211  GLUCAP 98 121*     Iron Studies:  Recent Labs    04/14/22 0430  IRON 63  TIBC 231*   Studies/Results: No results found.  carvedilol  12.5 mg Oral BID WC   clopidogrel  75 mg Oral Daily   diclofenac Sodium  2 g Topical QID   heparin  5,000 Units Subcutaneous Q8H   hydrALAZINE  100 mg Oral TID   lisinopril  40 mg Oral Daily   mirabegron ER  50 mg Oral Daily   montelukast  10 mg Oral Daily   simvastatin  20 mg Oral q1800   spironolactone  12.5 mg Oral Daily    BMET    Component Value Date/Time   NA 142 04/14/2022 0430   K 4.0 04/14/2022 0430   CL 110 04/14/2022 0430   CO2 25 04/14/2022 0430   GLUCOSE 100 (H) 04/14/2022 0430   BUN 23 04/14/2022 0430   CREATININE 1.29 (H) 04/14/2022 0430   CALCIUM 10.1 04/14/2022 0430   GFRNONAA  39 (L) 04/14/2022 0430   GFRAA >60 09/17/2015 2016   CBC    Component Value Date/Time   WBC 6.8 04/14/2022 0430   RBC 3.27 (L) 04/14/2022 0430   HGB 9.7 (L) 04/14/2022 0430   HCT 30.6 (L) 04/14/2022 0430   PLT 213 04/14/2022 0430   MCV 93.6 04/14/2022 0430   MCH 29.7 04/14/2022 0430   MCHC 31.7 04/14/2022 0430   RDW 13.8 04/14/2022 0430   LYMPHSABS 2.0 04/11/2022 0422   MONOABS 0.8 04/11/2022 0422   EOSABS 0.2 04/11/2022 0422   BASOSABS 0.1 04/11/2022 0422     Assessment/Plan:  Labile HTN - home regimen had been lisinopril 40 mg, carvedilol 3.125 mg bid, hydralazine 50 mg tid, and torsemide 10 mg daily.  Lisinopril and torsemide held due to AKI.  Many changes to medications on 04/12/22:  Carvedilol was increased to 12.5 mg bid, hydralazine increased to 100 mg tid, and started on spironolactone 12.5 mg.  Secondary HTN workup started with normal TSH and cortisol, however the recent addition of spironolactone will affect results.  Would check renal artery duplex but can't be done as an inpatient here.  Can't do CT angio given recent AKI.  Consider MRI with contrast if available.  She would benefit from longer acting agents.   Lisinopril restarted yesterday  with mild increase in Scr.  Will need to continue to follow K and Scr while she remains an inpatient.   I would prefer starting amlodipine, however she had recent stool disimpaction and has chronic constipation.   Bp's trend starting to improve.  Will continue to follow BP readings.   AMS - likely due to baclofen.  Has improved since stopping it. AKI/CKD - improved after holding torsemide and lisinopril.  Lisinopril restarted 04/13/22 so will need to continue to follow renal function.  CKD stage IIIa - Scr back to baseline.  Presumably due to hypertensive nephrosclerosis.  Had CT scan of abdomen 10/30 which showed prominence of both renal collecting systems without frank hydronephrosis and nonobstructing stone in the upper pole of left kidney.  Has not seen nephrology before.  Will need to arrange for outpatient follow up with our practice after discharge.  Sinus node dysfunction - s/p PPM Chronic diastolic CHF- appears euvolemic. Anemia of CKD stage III - hold off on ESA given HTN.  TSAT ok at 27%.  Will need to start ESA as outpatient once bp stable.   Donetta Potts, MD [facility]  Pt will not be physically seen over the weekend, however labs and vitals will be monitored remotely.  On call coverage will be available if needed over weekend.  If BP and Scr remain stable over the next 24 hours, should be ok for discharge with outpatient follow up.

## 2022-04-15 DIAGNOSIS — N179 Acute kidney failure, unspecified: Secondary | ICD-10-CM

## 2022-04-15 LAB — RENAL FUNCTION PANEL
Albumin: 3.5 g/dL (ref 3.5–5.0)
Anion gap: 8 (ref 5–15)
BUN: 22 mg/dL (ref 8–23)
CO2: 24 mmol/L (ref 22–32)
Calcium: 10 mg/dL (ref 8.9–10.3)
Chloride: 111 mmol/L (ref 98–111)
Creatinine, Ser: 1.28 mg/dL — ABNORMAL HIGH (ref 0.44–1.00)
GFR, Estimated: 40 mL/min — ABNORMAL LOW (ref >60)
Glucose, Bld: 97 mg/dL (ref 70–99)
Phosphorus: 2.9 mg/dL (ref 2.5–4.6)
Potassium: 3.7 mmol/L (ref 3.5–5.1)
Sodium: 143 mmol/L (ref 135–145)

## 2022-04-15 MED ORDER — POLYETHYLENE GLYCOL 3350 17 G PO PACK: 17.0000 g | PACK | Freq: Every day | ORAL | 0 refills | Status: DC

## 2022-04-15 MED ORDER — LISINOPRIL 40 MG PO TABS: 40.0000 mg | ORAL_TABLET | Freq: Every day | ORAL | 1 refills | Status: AC

## 2022-04-15 MED ORDER — DICLOFENAC SODIUM 1 % EX GEL: 2.0000 g | Freq: Four times a day (QID) | CUTANEOUS | 1 refills | Status: AC

## 2022-04-15 MED ORDER — CARVEDILOL 12.5 MG PO TABS: 12.5000 mg | ORAL_TABLET | Freq: Two times a day (BID) | ORAL | 1 refills | Status: DC

## 2022-04-15 MED ORDER — SPIRONOLACTONE 25 MG PO TABS: 12.5000 mg | ORAL_TABLET | Freq: Every day | ORAL | 1 refills | Status: DC

## 2022-04-15 MED ORDER — SENNA 8.6 MG PO TABS: 2.0000 | ORAL_TABLET | Freq: Every day | ORAL | 0 refills | Status: DC

## 2022-04-15 NOTE — Plan of Care (Signed)

## 2022-04-15 NOTE — Discharge Summary (Signed)
Physician Discharge Summary   Patient: Jodi Peters MRN: [id number] DOB: [birth year]  Admit date:     04/10/2022  Discharge date: 04/15/22  Discharge Physician: Shanon Brow Ausar Georgiou   PCP: Carrolyn Meiers, MD   Recommendations at discharge:   Please follow up with primary care provider within 1-2 weeks  Please repeat BMP and CBC in one week Follow up with nephrology, [facility]. [facility] Course: 88 year old female with history of anxiety, carpal tunnel syndrome, constipation, hypertension, hyperlipidemia, sinus node dysfunction status post PPM presenting with altered mental status. Patient had reported that she was feeling confused, talking incoherently, and feeling foolish.  She reports that she was given a new medication for right hand pain, and that the medication was too strong for her.  Her baclofen was stopped and she was started on IVF for her AKI.  She improved clinically and mental status improved back to baseline.  Her hospital stay was prolonged due to difficult to control HTN.  Nephrology was consulted to assist.  Assessment and Plan: * Acute metabolic encephalopathy - Patient reportedly confused PTA - Patient remembers being incoherent and feeling foolish -multifactorial including baclofen, AKI, HTN - Polypharmacy likely also contributing, patient was recently started on baclofen for hand pain. - Holding baclofen, gabapentin, Zoloft as these all could be contributing - 04/12/22 A&O x 4 and remained stable thereafter - CT head shows no acute intracranial findings - UA is not indicative of UTI - Chest x-ray shows no active disease -multifactorial due to HTN, acute on chronic renal failure, baclofen  Depression - Hold Zoloft secondary to altered mental status>>restarted  Acute renal failure superimposed on stage 3b chronic kidney disease (HCC) - Creatinine baseline 1.1-1.3 - presenting creatinine is 1.76 - due to volume depletion in setting of lisinopril - Hold  torsemide and lisinopril initially - Half a liter bolus in the ED, continued gentle hydration --lisinopril restarted 11/2 - 04/13/22--serum creatinine 1.16>>1.29 -- 04/15/22 serum creatinine 1.28  Essential hypertension - Continue Coreg, hydralazine -increased coreg to 12.5 mg bid and hydralazine 100 mg tid - Holding lisinopril in the setting of AKI initially>>restarted and renal function remained stable after restart -added spironolactone --checked renin/aldo ratio, but will be affected by spiro -appreciate nephrology consult>>restart lisinopril -start miralax and senna daily preemptively for constipation if need to start amlodipine SBP 150-160s on day of dc  Cerebrovascular disease - Continue Plavix and statin  Mixed hyperlipidemia - Continue statin         Consultants: renal Procedures performed: none  Disposition: Home Diet recommendation:  Cardiac diet DISCHARGE MEDICATION: Allergies as of 04/15/2022       Reactions   Penicillins Rash   Has patient had a PCN reaction causing immediate rash, facial/tongue/throat swelling, SOB or lightheadedness with hypotension: Yes Has patient had a PCN reaction causing severe rash involving mucus membranes or skin necrosis: No Has patient had a PCN reaction that required hospitalization No Has patient had a PCN reaction occurring within the last 10 years: Yes If all of the above answers are "NO", then may proceed with Cephalosporin use.        Medication List     STOP taking these medications    baclofen 10 MG tablet Commonly known as: LIORESAL   benzonatate 100 MG capsule Commonly known as: TESSALON   torsemide 10 MG tablet Commonly known as: DEMADEX       TAKE these medications    acetaminophen 500 MG tablet Commonly known as: TYLENOL Take  500 mg by mouth every 6 (six) hours as needed for mild pain or moderate pain.   carvedilol 12.5 MG tablet Commonly known as: COREG Take 1 tablet (12.5 mg total) by mouth  2 (two) times daily with a meal. What changed:  medication strength how much to take when to take this   clopidogrel 75 MG tablet Commonly known as: PLAVIX Take 1 tablet (75 mg total) by mouth daily.   diclofenac Sodium 1 % Gel Commonly known as: VOLTAREN Apply 2 g topically 4 (four) times daily.   gabapentin 100 MG capsule Commonly known as: NEURONTIN Take 100 mg by mouth 3 (three) times daily.   hydrALAZINE 100 MG tablet Commonly known as: APRESOLINE Take 1 tablet (100 mg total) by mouth 3 (three) times daily. What changed:  medication strength how much to take   lisinopril 40 MG tablet Commonly known as: ZESTRIL Take 1 tablet (40 mg total) by mouth daily. Start taking on: April 16, 2022   mirtazapine 15 MG tablet Commonly known as: REMERON Take 15 mg by mouth at bedtime.   montelukast 10 MG tablet Commonly known as: SINGULAIR Take 10 mg by mouth daily.   Myrbetriq 50 MG Tb24 tablet Generic drug: mirabegron ER Take 50 mg by mouth daily. What changed: Another medication with the same name was removed. Continue taking this medication, and follow the directions you see here.   polyethylene glycol 17 g packet Commonly known as: MIRALAX / GLYCOLAX Take 17 g by mouth daily. Start taking on: April 16, 2022   senna 8.6 MG Tabs tablet Commonly known as: SENOKOT Take 2 tablets (17.2 mg total) by mouth daily. Start taking on: April 16, 2022   sertraline 25 MG tablet Commonly known as: ZOLOFT Take 25 mg by mouth every morning.   simvastatin 20 MG tablet Commonly known as: ZOCOR Take 20 mg by mouth daily at 6 PM.   spironolactone 25 MG tablet Commonly known as: ALDACTONE Take 0.5 tablets (12.5 mg total) by mouth daily. Start taking on: April 16, 2022        Follow-up Information     [facility] Follow up.   Specialty: Home Health Services Why: Will contact you to schedule home health services. Contact information: [place] [address] [phone]                Discharge Exam: Danley Danker Weights   04/10/22 1346 04/11/22 0023  Weight: 51.7 kg 53.6 kg   HEENT:  [place]/AT, No thrush, no icterus CV:  RRR, no rub, no S3, no S4 Lung:  CTA, no wheeze, no rhonchi Abd:  soft/+BS, NT Ext:  No edema, no lymphangitis, no synovitis, no rash   Condition at discharge: stable  The results of significant diagnostics from this hospitalization (including imaging, microbiology, ancillary and laboratory) are listed below for reference.   Imaging Studies: CT ABDOMEN PELVIS WO CONTRAST  Result Date: 04/10/2022 CLINICAL DATA:  88 year old with constipation. Patient reports no abdominal pain. EXAM: CT ABDOMEN AND PELVIS WITHOUT CONTRAST TECHNIQUE: Multidetector CT imaging of the abdomen and pelvis was performed following the standard protocol without IV contrast. RADIATION DOSE REDUCTION: This exam was performed according to the departmental dose-optimization program which includes automated exposure control, adjustment of the mA and/or kV according to patient size and/or use of iterative reconstruction technique. COMPARISON:  Remote contrast-enhanced exam 04/19/2004 FINDINGS: Lower chest: Normal heart size. Pacemaker wires partially included. Subsegmental atelectasis in the left lower lobe. No  basilar airspace disease or pleural effusion. Hepatobiliary: No focal liver abnormality is seen. Status post cholecystectomy. No biliary dilatation. Pancreas: No ductal dilatation or inflammation. Spleen: Normal in size without focal abnormality. Adrenals/Urinary Tract: No adrenal nodule. Slight prominence of both renal collecting systems without frank hydronephrosis. Punctate nonobstructing stone in the upper pole of the left kidney. No significant perinephric edema. Left renal cysts. No specific imaging follow-up is needed. Mild bladder distention without wall thickening. Stomach/Bowel: The stomach is decompressed. There  is no small bowel obstruction or inflammation. Minimal fecalization of distal small bowel contents. Appendix not confidently visualized, no evidence of appendicitis. Moderate volume of stool throughout the colon. Scattered colonic diverticula, no diverticulitis. No colonic wall thickening or inflammatory change. Possible small rectocele. Vascular/Lymphatic: Moderate aortic and branch atherosclerosis. No aortic aneurysm. No bulky abdominopelvic adenopathy. Reproductive: The uterus is not seen, presumably surgically absent. There is no adnexal mass. Other: No free air. No ascites. No abdominopelvic collection. Tiny fat containing umbilical hernia. Musculoskeletal: Diffuse degenerative change in the spine. Degenerative change in the pubic symphysis and both hips. There are no acute or suspicious osseous abnormalities. IMPRESSION: 1. Moderate colonic stool burden with fecalization of distal small bowel contents, suggesting slow transit/constipation. No bowel obstruction or inflammation. Possible small rectocele. 2. Punctate nonobstructing stone in the upper pole of the left kidney. 3. Colonic diverticulosis without diverticulitis. Aortic Atherosclerosis (ICD10-I70.0). Electronically Signed   By: Keith Rake M.D.   On: 04/10/2022 18:21   DG Chest Port 1 View  Result Date: 04/10/2022 CLINICAL DATA:  Altered mental status EXAM: PORTABLE CHEST 1 VIEW COMPARISON:  Chest x-ray 12/20/2009 FINDINGS: Left sided pacemaker is unchanged in position. The heart size and mediastinal contours are within normal limits. Both lungs are clear. The visualized skeletal structures are unremarkable. IMPRESSION: No active disease. Electronically Signed   By: Ronney Asters M.D.   On: 04/10/2022 16:36   CT Head Wo Contrast  Result Date: 04/10/2022 CLINICAL DATA:  Weakness and confusion EXAM: CT HEAD WITHOUT CONTRAST TECHNIQUE: Contiguous axial images were obtained from the base of the skull through the vertex without intravenous  contrast. RADIATION DOSE REDUCTION: This exam was performed according to the departmental dose-optimization program which includes automated exposure control, adjustment of the mA and/or kV according to patient size and/or use of iterative reconstruction technique. COMPARISON:  11/10/2020 FINDINGS: Brain: Periventricular white matter and corona radiata hypodensities favor chronic ischemic microvascular white matter disease. Confluence in the right frontal lobe potentially with a small chronic region of remote right frontal lobe infarct on image 17 series 2. This is unchanged from 11/10/2020. Otherwise, the brainstem, cerebellum, cerebral peduncles, thalamus, basal ganglia, basilar cisterns, and ventricular system appear within normal limits. No intracranial hemorrhage, mass lesion, or acute CVA. Vascular: There is atherosclerotic calcification of the cavernous carotid arteries bilaterally. Skull: Hyperostosis frontalis interna. Sinuses/Orbits: Unremarkable Other: Bulbous expansion of the helix and lobule of the left external ear, similar to previous, possibly from remote trauma or keloid. IMPRESSION: 1. No acute intracranial findings. 2. Periventricular white matter and corona radiata hypodensities favor chronic ischemic microvascular white matter disease. 3. Small region of chronic region of encephalomalacia in the right frontal lobe. 4. Atherosclerosis. 5. Bulbous expansion of the helix and lobule of the left external ear, similar to previous, possibly from remote trauma or keloid. Electronically Signed   By: Van Clines M.D.   On: 04/10/2022 15:05   DG Hand Complete Right  Result Date: 04/04/2022 CLINICAL DATA:  Right hand numbness, initial encounter  EXAM: RIGHT HAND - COMPLETE 3+ VIEW COMPARISON:  11/21/2006 FINDINGS: There is no evidence of fracture or dislocation. There is no evidence of arthropathy or other focal bone abnormality. Soft tissues are unremarkable. IMPRESSION: No acute abnormality  noted. Electronically Signed   By: Inez Catalina M.D.   On: 04/04/2022 20:45    Microbiology: Results for orders placed or performed during the hospital encounter of 11/10/20  Resp Panel by RT-PCR (Flu A&B, Covid) Nasopharyngeal Swab     Status: None   Collection Time: 11/10/20  9:17 PM   Specimen: Nasopharyngeal Swab; Nasopharyngeal(NP) swabs in vial transport medium  Result Value Ref Range Status   SARS Coronavirus 2 by RT PCR NEGATIVE NEGATIVE Final    Comment: (NOTE) SARS-CoV-2 target nucleic acids are NOT DETECTED.  The SARS-CoV-2 RNA is generally detectable in upper respiratory specimens during the acute phase of infection. The lowest concentration of SARS-CoV-2 viral copies this assay can detect is 138 copies/mL. A negative result does not preclude SARS-Cov-2 infection and should not be used as the sole basis for treatment or other patient management decisions. A negative result may occur with  improper specimen collection/handling, submission of specimen other than nasopharyngeal swab, presence of viral mutation(s) within the areas targeted by this assay, and inadequate number of viral copies(<138 copies/mL). A negative result must be combined with clinical observations, patient history, and epidemiological information. The expected result is Negative.  Fact Sheet for Patients:  EntrepreneurPulse.com.au  Fact Sheet for Healthcare Providers:  IncredibleEmployment.be  This test is no t yet approved or cleared by the [place] FDA and  has been authorized for detection and/or diagnosis of SARS-CoV-2 by FDA under an Emergency Use Authorization (EUA). This EUA will remain  in effect (meaning this test can be used) for the duration of the COVID-19 declaration under Section 564(b)(1) of the Act, 21 U.S.C.section 360bbb-3(b)(1), unless the authorization is terminated  or revoked sooner.       Influenza A by PCR NEGATIVE NEGATIVE Final    Influenza B by PCR NEGATIVE NEGATIVE Final    Comment: (NOTE) The Xpert Xpress SARS-CoV-2/FLU/RSV plus assay is intended as an aid in the diagnosis of influenza from Nasopharyngeal swab specimens and should not be used as a sole basis for treatment. Nasal washings and aspirates are unacceptable for Xpert Xpress SARS-CoV-2/FLU/RSV testing.  Fact Sheet for Patients: EntrepreneurPulse.com.au  Fact Sheet for Healthcare Providers: IncredibleEmployment.be  This test is not yet approved or cleared by the [place] FDA and has been authorized for detection and/or diagnosis of SARS-CoV-2 by FDA under an Emergency Use Authorization (EUA). This EUA will remain in effect (meaning this test can be used) for the duration of the COVID-19 declaration under Section 564(b)(1) of the Act, 21 U.S.C. section 360bbb-3(b)(1), unless the authorization is terminated or revoked.  Performed at [facility], [address], [place], [place] [address]     Labs: CBC: Recent Labs  Lab 04/10/22 1527 04/11/22 0422 04/12/22 0445 04/14/22 0430  WBC 8.3 6.5 5.7 6.8  NEUTROABS 5.5 3.4  --   --   HGB 10.3* 9.9* 9.2* 9.7*  HCT 32.3* 30.9* 29.7* 30.6*  MCV 93.9 93.4 95.2 93.6  PLT 229 220 201 789   Basic Metabolic Panel: Recent Labs  Lab 04/11/22 0422 04/12/22 0445 04/13/22 0436 04/14/22 0430 04/15/22 0512  NA 143 141 141 142 143  K 3.7 3.9 3.7 4.0 3.7  CL 109 110 110 110 111  CO2 '26 27 25 25 24  '$ GLUCOSE  98 100* 97 100* 97  BUN 36* 31* 25* 23 22  CREATININE 1.45* 1.26* 1.16* 1.29* 1.28*  CALCIUM 9.8 9.3 9.6 10.1 10.0  MG 2.3  --   --   --   --   PHOS  --   --   --  2.9 2.9   Liver Function Tests: Recent Labs  Lab 04/10/22 1527 04/11/22 0422 04/14/22 0430 04/15/22 0512  AST 23 20  --   --   ALT 14 14  --   --   ALKPHOS 78 64  --   --   BILITOT 0.8 1.1  --   --   PROT 7.1 6.5  --   --   ALBUMIN 4.1 3.8 3.8 3.5   CBG: Recent Labs  Lab  04/10/22 1520 04/12/22 2211  GLUCAP 98 121*    Discharge time spent: greater than 30 minutes.  Signed: Orson Eva, MD [facility] 04/15/2022

## 2022-04-15 NOTE — TOC Transition Note (Signed)
Transition of Care [facility]) - CM/SW Discharge Note   Patient Details  Name: Jodi Peters MRN: [id number] Date of Birth: Mar 02, 1930  [facility]) CM/SW Contact:  Shade Flood, LCSW Phone Number: 04/15/2022, 11:50 AM   Clinical Narrative:     Pt stable for dc today per MD. Updated Tommi Rumps at [facility].  Final next level of care: [place] Barriers to Discharge: Barriers Resolved   Patient Goals and CMS Choice        Discharge Placement                  Name of family member notified: [name]- daughter in room Patient and family notified of of transfer: 04/12/22  Discharge Plan and [place]: [place] Date [place]: 04/12/22 Time [place]: 6962 Representative spoke with at [place]: Tommi Rumps  Social Determinants of Health ([place]) Interventions     Readmission Risk Interventions     No data to display

## 2022-04-15 NOTE — Progress Notes (Signed)
Patient has rested well and has had no new complaints.  Vitals have been stable. Patient has been at baseline mentation.

## 2022-04-20 LAB — ALDOSTERONE + RENIN ACTIVITY W/ RATIO
ALDO / PRA Ratio: 1.2 (ref 0.0–30.0)
Aldosterone: 1 ng/dL (ref 0.0–30.0)
PRA LC/MS/MS: 0.852 ng/mL/hr (ref 0.167–5.380)

## 2022-04-29 ENCOUNTER — Encounter (HOSPITAL_COMMUNITY): Payer: Self-pay

## 2022-04-29 ENCOUNTER — Other Ambulatory Visit: Payer: Self-pay

## 2022-04-29 ENCOUNTER — Emergency Department (HOSPITAL_COMMUNITY)
Admission: EM | Admit: 2022-04-29 | Discharge: 2022-04-29 | Disposition: A | Discharge status: Home / Self Care | Payer: Medicare Other | Attending: Emergency Medicine | Admitting: Emergency Medicine

## 2022-04-29 DIAGNOSIS — I129 Hypertensive chronic kidney disease with stage 1 through stage 4 chronic kidney disease, or unspecified chronic kidney disease: Secondary | ICD-10-CM | POA: Diagnosis not present

## 2022-04-29 DIAGNOSIS — I160 Hypertensive urgency: Secondary | ICD-10-CM | POA: Diagnosis not present

## 2022-04-29 DIAGNOSIS — R29898 Other symptoms and signs involving the musculoskeletal system: Secondary | ICD-10-CM

## 2022-04-29 DIAGNOSIS — Z20822 Contact with and (suspected) exposure to covid-19: Secondary | ICD-10-CM | POA: Diagnosis not present

## 2022-04-29 DIAGNOSIS — R531 Weakness: Secondary | ICD-10-CM | POA: Diagnosis present

## 2022-04-29 DIAGNOSIS — M6259 Muscle wasting and atrophy, not elsewhere classified, multiple sites: Secondary | ICD-10-CM | POA: Insufficient documentation

## 2022-04-29 DIAGNOSIS — Z79899 Other long term (current) drug therapy: Secondary | ICD-10-CM | POA: Insufficient documentation

## 2022-04-29 DIAGNOSIS — N189 Chronic kidney disease, unspecified: Secondary | ICD-10-CM | POA: Insufficient documentation

## 2022-04-29 LAB — CBC WITH DIFFERENTIAL/PLATELET
Abs Immature Granulocytes: 0.02 10*3/uL (ref 0.00–0.07)
Basophils Absolute: 0.1 10*3/uL (ref 0.0–0.1)
Basophils Relative: 1 %
Eosinophils Absolute: 0.1 10*3/uL (ref 0.0–0.5)
Eosinophils Relative: 1 %
HCT: 30.4 % — ABNORMAL LOW (ref 36.0–46.0)
Hemoglobin: 9.7 g/dL — ABNORMAL LOW (ref 12.0–15.0)
Immature Granulocytes: 0 %
Lymphocytes Relative: 25 %
Lymphs Abs: 1.6 10*3/uL (ref 0.7–4.0)
MCH: 30.2 pg (ref 26.0–34.0)
MCHC: 31.9 g/dL (ref 30.0–36.0)
MCV: 94.7 fL (ref 80.0–100.0)
Monocytes Absolute: 0.9 10*3/uL (ref 0.1–1.0)
Monocytes Relative: 14 %
Neutro Abs: 3.8 10*3/uL (ref 1.7–7.7)
Neutrophils Relative %: 59 %
Platelets: 238 10*3/uL (ref 150–400)
RBC: 3.21 MIL/uL — ABNORMAL LOW (ref 3.87–5.11)
RDW: 14 % (ref 11.5–15.5)
WBC: 6.5 10*3/uL (ref 4.0–10.5)
nRBC: 0 % (ref 0.0–0.2)

## 2022-04-29 LAB — COMPREHENSIVE METABOLIC PANEL
ALT: 10 U/L (ref 0–44)
AST: 13 U/L — ABNORMAL LOW (ref 15–41)
Albumin: 3.9 g/dL (ref 3.5–5.0)
Alkaline Phosphatase: 59 U/L (ref 38–126)
Anion gap: 9 (ref 5–15)
BUN: 22 mg/dL (ref 8–23)
CO2: 23 mmol/L (ref 22–32)
Calcium: 9.5 mg/dL (ref 8.9–10.3)
Chloride: 105 mmol/L (ref 98–111)
Creatinine, Ser: 1.53 mg/dL — ABNORMAL HIGH (ref 0.44–1.00)
GFR, Estimated: 32 mL/min — ABNORMAL LOW (ref >60)
Glucose, Bld: 101 mg/dL — ABNORMAL HIGH (ref 70–99)
Potassium: 4.1 mmol/L (ref 3.5–5.1)
Sodium: 137 mmol/L (ref 135–145)
Total Bilirubin: 0.5 mg/dL (ref 0.3–1.2)
Total Protein: 6.5 g/dL (ref 6.5–8.1)

## 2022-04-29 LAB — RESP PANEL BY RT-PCR (FLU A&B, COVID) ARPGX2
Influenza A by PCR: NEGATIVE
Influenza B by PCR: NEGATIVE
SARS Coronavirus 2 by RT PCR: NEGATIVE

## 2022-04-29 LAB — TROPONIN I (HIGH SENSITIVITY): Troponin I (High Sensitivity): 13 ng/L (ref <18)

## 2022-04-29 MED ORDER — HYDRALAZINE HCL 20 MG/ML IJ SOLN
10.0000 mg | Freq: Once | INTRAMUSCULAR | Status: AC
  Administered 2022-04-29: 10 mg via INTRAVENOUS
  Filled 2022-04-29: qty 1

## 2022-04-29 MED ORDER — SODIUM CHLORIDE 0.9 % IV BOLUS
1000.0000 mL | Freq: Once | INTRAVENOUS | Status: AC
  Administered 2022-04-29: 1000 mL via INTRAVENOUS

## 2022-04-29 NOTE — Discharge Instructions (Addendum)
The work-up in the emergency room is reassuring.  Your blood pressure is elevated. We recommend that when you get home, you take your home medications including that for blood pressure.  We suspect that your weakness could be because of deconditioning from admission to the hospital.  Work-up in the emergency room is negative for any infection.

## 2022-04-29 NOTE — ED Notes (Signed)
Cranberry juice given to pt.  

## 2022-04-29 NOTE — ED Triage Notes (Signed)
Patient arrives to ED from home with son c/o generalized weakness and "just feeling sick." Pt states she has pain in her right hand, and that it is swollen. Pt also reports a decreased appetite. Pt is a&o x4, and NAD.

## 2022-04-29 NOTE — ED Provider Notes (Signed)
[facility] Provider Note   CSN: [id number] Arrival date & time: 04/29/22  1624     History  Chief Complaint  Patient presents with   Weakness    [facility] is a 88 y.o. female.  HPI    88 year old female with history of anxiety, constipation, hypertension, CKD comes in with chief complaint of generalized weakness.  Patient was admitted to the hospital on 2 separate occasions this month with altered mental status.  She resides at home with her son.  According the patient, she has been having generalized weakness the last few days.  She feels like her energy level is low.  She denies any severe headache, neck pain, URI-like symptoms besides mild congestion, chest pain, shortness of breath, abdominal pain, UTI-like symptoms.  Patient denies any fall.  Son states that patient's p.o. intake is slightly lower than usual.  He has not noted any fevers or chills.  There has not been any confusion.  Patient was admitted for acute renal failure and altered mental status secondary to medication side effects.  Home Medications Prior to Admission medications   Medication Sig Start Date End Date Taking? Authorizing Provider  acetaminophen (TYLENOL) 500 MG tablet Take 500 mg by mouth every 6 (six) hours as needed for mild pain or moderate pain.   Yes [provider]  carvedilol (COREG) 12.5 MG tablet Take 1 tablet (12.5 mg total) by mouth 2 (two) times daily with a meal. 04/15/22  Yes Tat, Shanon Brow, MD  clopidogrel (PLAVIX) 75 MG tablet Take 1 tablet (75 mg total) by mouth daily. 06/23/15  Yes Fanta, Normajean Baxter, MD  diclofenac Sodium (VOLTAREN) 1 % GEL Apply 2 g topically 4 (four) times daily. 04/15/22  Yes Tat, Shanon Brow, MD  hydrALAZINE (APRESOLINE) 100 MG tablet Take 1 tablet (100 mg total) by mouth 3 (three) times daily. 04/12/22  Yes Tat, Shanon Brow, MD  lisinopril (ZESTRIL) 40 MG tablet Take 1 tablet (40 mg total) by mouth daily. 04/16/22  Yes Tat, Shanon Brow, MD   mirtazapine (REMERON) 15 MG tablet Take 15 mg by mouth at bedtime.   Yes [provider]  montelukast (SINGULAIR) 10 MG tablet Take 10 mg by mouth daily. 03/17/22  Yes [provider]  MYRBETRIQ 50 MG TB24 tablet Take 50 mg by mouth daily. 07/14/21  Yes [provider]  polyethylene glycol (MIRALAX / GLYCOLAX) 17 g packet Take 17 g by mouth daily. 04/16/22  Yes Tat, Shanon Brow, MD  senna (SENOKOT) 8.6 MG TABS tablet Take 2 tablets (17.2 mg total) by mouth daily. 04/16/22  Yes Tat, Shanon Brow, MD  sertraline (ZOLOFT) 25 MG tablet Take 25 mg by mouth every morning. 07/06/21  Yes [provider]  simvastatin (ZOCOR) 20 MG tablet Take 20 mg by mouth daily at 6 PM.  07/13/15  Yes [provider]  spironolactone (ALDACTONE) 25 MG tablet Take 0.5 tablets (12.5 mg total) by mouth daily. 04/16/22  Yes Tat, Shanon Brow, MD  vitamin B-12 (CYANOCOBALAMIN) 100 MCG tablet Take 100 mcg by mouth daily.   Yes [provider]  gabapentin (NEURONTIN) 100 MG capsule Take 100 mg by mouth 3 (three) times daily. Patient not taking: Reported on 04/29/2022 03/28/22   [provider]      Allergies    Penicillins    Review of Systems   Review of Systems  All other systems reviewed and are negative.   Physical Exam Updated Vital Signs BP (!) 209/70   Pulse 73   Temp  98.6 F (37 C) (Oral)   Resp 16   Ht '5\' 4"'$  (1.626 m)   Wt 51.7 kg   SpO2 100%   BMI 19.57 kg/m  Physical Exam Vitals and nursing note reviewed.  Constitutional:      Appearance: She is well-developed.  HENT:     Head: Atraumatic.  Eyes:     Extraocular Movements: Extraocular movements intact.     Pupils: Pupils are equal, round, and reactive to light.  Cardiovascular:     Rate and Rhythm: Normal rate.  Pulmonary:     Effort: Pulmonary effort is normal.  Musculoskeletal:     Cervical back: Normal range of motion and neck supple.  Skin:    General: Skin is warm and dry.  Neurological:      Mental Status: She is alert and oriented to person, place, and time.     Cranial Nerves: No cranial nerve deficit.     Sensory: No sensory deficit.     Motor: No weakness.     Coordination: Coordination normal.     ED Results / Procedures / Treatments   Labs (all labs ordered are listed, but only abnormal results are displayed) Labs Reviewed  COMPREHENSIVE METABOLIC PANEL - Abnormal; Notable for the following components:      Result Value   Glucose, Bld 101 (*)    Creatinine, Ser 1.53 (*)    AST 13 (*)    GFR, Estimated 32 (*)    All other components within normal limits  CBC WITH DIFFERENTIAL/PLATELET - Abnormal; Notable for the following components:   RBC 3.21 (*)    Hemoglobin 9.7 (*)    HCT 30.4 (*)    All other components within normal limits  RESP PANEL BY RT-PCR (FLU A&B, COVID) ARPGX2  CBC WITH DIFFERENTIAL/PLATELET  TROPONIN I (HIGH SENSITIVITY)    EKG EKG Interpretation  Date/Time:  Saturday April 29 2022 18:04:07 EST Ventricular Rate:  70 PR Interval:  177 QRS Duration: 93 QT Interval:  418 QTC Calculation: 451 R Axis:   48 Text Interpretation: Sinus rhythm No acute changes normal intervals No significant change since last tracing Confirmed by Varney Biles [id number]) on 04/29/2022 6:33:40 PM  Radiology No results found.  Procedures Procedures    Medications Ordered in ED Medications  sodium chloride 0.9 % bolus 1,000 mL (0 mLs Intravenous Stopped 04/29/22 2042)  hydrALAZINE (APRESOLINE) injection 10 mg (10 mg Intravenous Given 04/29/22 2039)    ED Course/ Medical Decision Making/ A&P                           Medical Decision Making This patient presents to the ED with chief complaint(s) of generalized weakness with pertinent past medical history of CKD, hypertension, hyperlipidemia and recent admission for medication effects leading to encephalopathy.The complaint involves an extensive differential diagnosis and also carries with it a high risk  of complications and morbidity.    The differential diagnosis considered includes : ICH / Stroke, acute coronary syndrome, Infection - UTI/Pneumonia/soft tissue infection/COVID-19, worsening renal failure, severe electrolyte abnormality, deconditioning from 2 separate admissions to the hospital earlier this month, dehydration.    The initial plan is to get basic labs.  Patient has no focal neurodeficits.  History is not indicative of any underlying infection.  Screening EKG will be ordered along with basic labs.  No CT scan of the brain indicated at this time.   Additional history obtained: Additional history obtained from family,  patient's son provides substantial history Records reviewed previous admission documents  Independent labs interpretation:  The following labs were independently interpreted: CBC, metabolic profile reassuring  Treatment and Reassessment: Results discussed with the patient.  During reassessment, BP noted to be high.  Patient has no chest pain, shortness of breath, headaches.  Her BP was normal when she had arrived.  She states that she takes her medications as prescribed.  We will give her 1 dose of IV hydralazine, patient and her son made aware to take BP medication once they reach home.    Problems Addressed: Hypertensive urgency: acute illness or injury Severe muscle deconditioning: acute illness or injury Weakness: undiagnosed new problem with uncertain prognosis  Amount and/or Complexity of Data Reviewed Labs: ordered.  Risk Prescription drug management.    Final Clinical Impression(s) / ED Diagnoses Final diagnoses:  Hypertensive urgency  Weakness  Severe muscle deconditioning    Rx / DC Orders ED Discharge Orders     None         Varney Biles, MD 04/29/22 2118

## 2022-05-08 ENCOUNTER — Ambulatory Visit (INDEPENDENT_AMBULATORY_CARE_PROVIDER_SITE_OTHER): Payer: Medicare Other | Admitting: Orthopedic Surgery

## 2022-05-08 ENCOUNTER — Encounter: Payer: Self-pay | Admitting: Orthopedic Surgery

## 2022-05-08 VITALS — BP 216/71 | HR 73 | Ht 64.0 in | Wt 119.0 lb

## 2022-05-08 DIAGNOSIS — G5601 Carpal tunnel syndrome, right upper limb: Secondary | ICD-10-CM | POA: Diagnosis not present

## 2022-05-08 MED ORDER — PREDNISONE 10 MG PO TABS: 10.0000 mg | ORAL_TABLET | Freq: Three times a day (TID) | ORAL | 0 refills | Status: DC

## 2022-05-08 NOTE — Progress Notes (Signed)
Chief Complaint  Patient presents with   Hand Pain    Right for about a month hard to grip things    HPI: 88 year old female with severe right hand pain at night with numbness and tingling thumb index and long finger and then a separate complaint of pain at the base of the thumb  No obvious cause of carpal tunnel syndrome however symptoms warrant that is a consideration.  She has increased pain at night  Prior treatments include Tylenol 650 every 6 and gabapentin.  The gabapentin seems to be too strong for her she also tried a muscle relaxer and that also made her confused   Past Medical History:  Diagnosis Date   Allergic rhinitis    Anxiety    Bradycardia    Breast cancer (HCC)    Breast mass    left   Breast pain    leftr   Carotid atherosclerosis    Carpal tunnel syndrome    Cataract    Constipation    Cystocele 06/30/2014   Glaucoma    Hyperlipidemia    Hypertension    IBS (irritable bowel syndrome)    Low back pain    Mitral regurgitation    sinus bradycardia   Osteoarthritis    Osteopenia    Rectocele 06/30/2014   Seborrheic keratosis    Venous insufficiency    Vertigo    Vitamin D deficiency     BP (!) 216/71   Pulse 73   Ht '5\' 4"'$  (1.626 m)   Wt 119 lb (54 kg)   BMI 20.43 kg/m    General appearance: Well-developed well-nourished no gross deformities  Cardiovascular normal pulse and perfusion normal color without edema  Neurologically no sensation loss or deficits or pathologic reflexes  Psychological: Awake alert and oriented x3 mood and affect normal  Skin no lacerations or ulcerations no nodularity no palpable masses, no erythema or nodularity  Musculoskeletal: Right hand shows thenar atrophy and first webspace atrophy.  She has numbness and tingling in the thumb index and long finger with pain at the base of the thumb and subluxation and crepitance of the [facility] joint  Imaging I was able to review the outside images and it shows subluxation of the  [facility] joint mild degenerative changes which include mild joint space narrowing and subluxation of the joint and some sclerosis around the joint  A/P  She definitely has symptoms of carpal tunnel syndrome which requires nerve conduction study.  Will treat her with a brace involving the thumb and wrist and start her on some prednisone  I discussed with her daughter the possibility of anti-inflammatories but because of her heart disease the cardiologist have told her not to take naproxen.  Her daughter thinks that with that information she should probably not take NSAIDs and I agree  Any opioids would be contraindicated in the setting  Nerve conduction study brace return after bracing probably will need surgery looks like she is on Plavix and will need some time to let that wear off for elective surgery  Meds ordered this encounter  Medications   predniSONE (DELTASONE) 10 MG tablet    Sig: Take 1 tablet (10 mg total) by mouth 3 (three) times daily.    Dispense:  42 tablet    Refill:  0   Meds ordered this encounter  Medications   predniSONE (DELTASONE) 10 MG tablet    Sig: Take 1 tablet (10 mg total) by mouth 3 (three) times daily.  Dispense:  42 tablet    Refill:  0

## 2022-05-09 ENCOUNTER — Encounter: Payer: Self-pay | Admitting: Neurology

## 2022-05-19 ENCOUNTER — Ambulatory Visit (INDEPENDENT_AMBULATORY_CARE_PROVIDER_SITE_OTHER): Payer: Medicare Other

## 2022-05-19 ENCOUNTER — Telehealth: Payer: Self-pay | Admitting: *Deleted

## 2022-05-19 DIAGNOSIS — I495 Sick sinus syndrome: Secondary | ICD-10-CM

## 2022-05-19 NOTE — Telephone Encounter (Signed)
Pt has appt with [facility] in January, wants to wait until after that appt.

## 2022-05-22 LAB — CUP PACEART REMOTE DEVICE CHECK
Battery Remaining Longevity: 51 mo
Battery Voltage: 2.98 V
Brady Statistic AP VP Percent: 0.02 %
Brady Statistic AP VS Percent: 29.95 %
Brady Statistic AS VP Percent: 0.03 %
Brady Statistic AS VS Percent: 70 %
Brady Statistic RA Percent Paced: 29.62 %
Brady Statistic RV Percent Paced: 0.05 %
Date Time Interrogation Session: 20231211131211
Implantable Lead Connection Status: 753985
Implantable Lead Connection Status: 753985
Implantable Lead Implant Date: 20170320
Implantable Lead Implant Date: 20170320
Implantable Lead Location: 753859
Implantable Lead Location: 753860
Implantable Lead Model: 5076
Implantable Lead Model: 5076
Implantable Pulse Generator Implant Date: 20170320
Lead Channel Impedance Value: 304 Ohm
Lead Channel Impedance Value: 418 Ohm
Lead Channel Impedance Value: 418 Ohm
Lead Channel Impedance Value: 513 Ohm
Lead Channel Pacing Threshold Amplitude: 1 V
Lead Channel Pacing Threshold Amplitude: 1.125 V
Lead Channel Pacing Threshold Pulse Width: 0.4 ms
Lead Channel Pacing Threshold Pulse Width: 0.4 ms
Lead Channel Sensing Intrinsic Amplitude: 1.75 mV
Lead Channel Sensing Intrinsic Amplitude: 1.75 mV
Lead Channel Sensing Intrinsic Amplitude: 16.5 mV
Lead Channel Sensing Intrinsic Amplitude: 16.5 mV
Lead Channel Setting Pacing Amplitude: 2 V
Lead Channel Setting Pacing Amplitude: 2.25 V
Lead Channel Setting Pacing Pulse Width: 0.4 ms
Lead Channel Setting Sensing Sensitivity: 2.8 mV
Zone Setting Status: 755011
Zone Setting Status: 755011

## 2022-06-09 ENCOUNTER — Other Ambulatory Visit: Payer: Self-pay | Admitting: Orthopedic Surgery

## 2022-06-09 DIAGNOSIS — G5601 Carpal tunnel syndrome, right upper limb: Secondary | ICD-10-CM

## 2022-06-09 NOTE — Progress Notes (Signed)
Remote pacemaker transmission.   

## 2022-06-29 ENCOUNTER — Ambulatory Visit: Payer: Medicare Other | Admitting: Neurology

## 2022-07-09 ENCOUNTER — Other Ambulatory Visit: Payer: Self-pay | Admitting: Orthopedic Surgery

## 2022-07-09 DIAGNOSIS — G5601 Carpal tunnel syndrome, right upper limb: Secondary | ICD-10-CM

## 2022-09-06 ENCOUNTER — Encounter (HOSPITAL_COMMUNITY): Payer: Self-pay

## 2022-09-06 ENCOUNTER — Emergency Department (HOSPITAL_COMMUNITY): Payer: Medicare Other

## 2022-09-06 ENCOUNTER — Emergency Department (HOSPITAL_COMMUNITY)
Admission: EM | Admit: 2022-09-06 | Discharge: 2022-09-07 | Disposition: A | Discharge status: Skilled Nursing Facility | Payer: Medicare Other | Attending: Emergency Medicine | Admitting: Emergency Medicine

## 2022-09-06 ENCOUNTER — Other Ambulatory Visit: Payer: Self-pay

## 2022-09-06 DIAGNOSIS — I10 Essential (primary) hypertension: Secondary | ICD-10-CM | POA: Diagnosis not present

## 2022-09-06 DIAGNOSIS — Z79899 Other long term (current) drug therapy: Secondary | ICD-10-CM | POA: Insufficient documentation

## 2022-09-06 DIAGNOSIS — W06XXXA Fall from bed, initial encounter: Secondary | ICD-10-CM | POA: Insufficient documentation

## 2022-09-06 DIAGNOSIS — Z7902 Long term (current) use of antithrombotics/antiplatelets: Secondary | ICD-10-CM | POA: Insufficient documentation

## 2022-09-06 DIAGNOSIS — W19XXXA Unspecified fall, initial encounter: Secondary | ICD-10-CM

## 2022-09-06 DIAGNOSIS — R519 Headache, unspecified: Secondary | ICD-10-CM | POA: Insufficient documentation

## 2022-09-06 NOTE — Discharge Instructions (Signed)
Take Tylenol for pain and follow-up as needed 

## 2022-09-06 NOTE — ED Provider Notes (Signed)
[facility] Provider Note   CSN: [id number] Arrival date & time: 09/06/22  1939     History {Add pertinent medical, surgical, social history, OB history to HPI:1} Chief Complaint  Patient presents with   [facility] is a 89 y.o. female.  Patient has a history of hypertension.  She fell trying to get out of the bed and hit her head.  No loss of consciousness   Fall       Home Medications Prior to Admission medications   Medication Sig Start Date End Date Taking? Authorizing Provider  acetaminophen (TYLENOL) 500 MG tablet Take 500 mg by mouth every 6 (six) hours as needed for mild pain or moderate pain. Patient not taking: Reported on 05/08/2022    [provider]  acetaminophen (TYLENOL) 650 MG CR tablet Take 650 mg by mouth every 8 (eight) hours as needed for pain.    [provider]  carvedilol (COREG) 12.5 MG tablet Take 1 tablet (12.5 mg total) by mouth 2 (two) times daily with a meal. 04/15/22   Tat, Shanon Brow, MD  clopidogrel (PLAVIX) 75 MG tablet Take 1 tablet (75 mg total) by mouth daily. 06/23/15   Carrolyn Meiers, MD  diclofenac Sodium (VOLTAREN) 1 % GEL Apply 2 g topically 4 (four) times daily. 04/15/22   Orson Eva, MD  gabapentin (NEURONTIN) 100 MG capsule Take 100 mg by mouth 3 (three) times daily. 03/28/22   [provider]  hydrALAZINE (APRESOLINE) 100 MG tablet Take 1 tablet (100 mg total) by mouth 3 (three) times daily. 04/12/22   Orson Eva, MD  ibuprofen (ADVIL) 200 MG tablet Take 200 mg by mouth every 6 (six) hours as needed.    [provider]  lisinopril (ZESTRIL) 40 MG tablet Take 1 tablet (40 mg total) by mouth daily. 04/16/22   Orson Eva, MD  mirtazapine (REMERON) 15 MG tablet Take 15 mg by mouth at bedtime.    [provider]  montelukast (SINGULAIR) 10 MG tablet Take 10 mg by mouth daily. 03/17/22   [provider]  MYRBETRIQ 50 MG TB24 tablet  Take 50 mg by mouth daily. 07/14/21   [provider]  polyethylene glycol (MIRALAX / GLYCOLAX) 17 g packet Take 17 g by mouth daily. 04/16/22   Orson Eva, MD  predniSONE (DELTASONE) 10 MG tablet TAKE (1) TABLET BY MOUTH (3) TIMES DAILY. 07/10/22   Carole Civil, MD  senna (SENOKOT) 8.6 MG TABS tablet Take 2 tablets (17.2 mg total) by mouth daily. 04/16/22   Orson Eva, MD  sertraline (ZOLOFT) 25 MG tablet Take 25 mg by mouth every morning. 07/06/21   [provider]  simvastatin (ZOCOR) 20 MG tablet Take 20 mg by mouth daily at 6 PM.  07/13/15   [provider]  spironolactone (ALDACTONE) 25 MG tablet Take 0.5 tablets (12.5 mg total) by mouth daily. 04/16/22   Orson Eva, MD  vitamin B-12 (CYANOCOBALAMIN) 100 MCG tablet Take 100 mcg by mouth daily.    [provider]      Allergies    Penicillins    Review of Systems   Review of Systems  Physical Exam Updated Vital Signs BP (!) 139/52   Pulse 68   Temp 98.8 F (37.1 C) (Oral)   Resp 14   Ht 5\' 4"  (1.626 m)   Wt 54 kg   SpO2 100%   BMI 20.43 kg/m  Physical Exam  ED Results / Procedures / Treatments   Labs (all labs ordered are listed, but only abnormal results are displayed) Labs Reviewed - No data to display  EKG None  Radiology CT Head Wo Contrast  Result Date: 09/06/2022 CLINICAL DATA:  Neck trauma, intoxicated or obtunded (Age >= 16y); Head trauma, intracranial arterial injury suspected EXAM: CT HEAD WITHOUT CONTRAST CT CERVICAL SPINE WITHOUT CONTRAST TECHNIQUE: Multidetector CT imaging of the head and cervical spine was performed following the standard protocol without intravenous contrast. Multiplanar CT image reconstructions of the cervical spine were also generated. RADIATION DOSE REDUCTION: This exam was performed according to the departmental dose-optimization program which includes automated exposure control, adjustment of the mA and/or kV according to patient size and/or use of  iterative reconstruction technique. COMPARISON:  CT head 04/10/2022 FINDINGS: CT HEAD FINDINGS Brain: No evidence of large-territorial acute infarction. No parenchymal hemorrhage. No mass lesion. No extra-axial collection. No mass effect or midline shift. No hydrocephalus. Basilar cisterns are patent. Vascular: No hyperdense vessel. Skull: No acute fracture or focal lesion. Sinuses/Orbits: Paranasal sinuses and mastoid air cells are clear. Bilateral lens replacement. Otherwise the orbits are unremarkable. Other: Similar-appearing bulbous expansion of the helix and lobule of the left external ear. CT CERVICAL SPINE FINDINGS Alignment: Normal. Skull base and vertebrae: Multilevel severe degenerative changes spine most prominent at the C6-C7 level with associated posterior disc osteophyte complex formation and ligamentum flavum calcification leading to at least mild to moderate osseous central canal stenosis. No associated severe osseous neural foraminal stenosis. No severe osseous central canal stenosis. No acute fracture. No aggressive appearing focal osseous lesion or focal pathologic process. Soft tissues and spinal canal: No prevertebral fluid or swelling. No visible canal hematoma. Upper chest: Unremarkable. Other: None. IMPRESSION: 1. No acute intracranial abnormality. 2. No acute displaced fracture or traumatic listhesis of the cervical spine. 3. appearing bulbous expansion of the helix and lobule of the left external ear. Correlate with physical exam. Electronically Signed   By: Iven Finn M.D.   On: 09/06/2022 21:32   CT Cervical Spine Wo Contrast  Result Date: 09/06/2022 CLINICAL DATA:  Neck trauma, intoxicated or obtunded (Age >= 16y); Head trauma, intracranial arterial injury suspected EXAM: CT HEAD WITHOUT CONTRAST CT CERVICAL SPINE WITHOUT CONTRAST TECHNIQUE: Multidetector CT imaging of the head and cervical spine was performed following the standard protocol without intravenous contrast.  Multiplanar CT image reconstructions of the cervical spine were also generated. RADIATION DOSE REDUCTION: This exam was performed according to the departmental dose-optimization program which includes automated exposure control, adjustment of the mA and/or kV according to patient size and/or use of iterative reconstruction technique. COMPARISON:  CT head 04/10/2022 FINDINGS: CT HEAD FINDINGS Brain: No evidence of large-territorial acute infarction. No parenchymal hemorrhage. No mass lesion. No extra-axial collection. No mass effect or midline shift. No hydrocephalus. Basilar cisterns are patent. Vascular: No hyperdense vessel. Skull: No acute fracture or focal lesion. Sinuses/Orbits: Paranasal sinuses and mastoid air cells are clear. Bilateral lens replacement. Otherwise the orbits are unremarkable. Other: Similar-appearing bulbous expansion of the helix and lobule of the left external ear. CT CERVICAL SPINE FINDINGS Alignment: Normal. Skull base and vertebrae: Multilevel severe degenerative changes spine most prominent at the C6-C7 level with associated posterior disc osteophyte complex formation and ligamentum flavum calcification leading to at least mild to moderate osseous central canal stenosis. No associated severe osseous neural foraminal stenosis. No severe osseous central canal stenosis. No acute fracture. No aggressive appearing focal osseous lesion or focal pathologic process.  Soft tissues and spinal canal: No prevertebral fluid or swelling. No visible canal hematoma. Upper chest: Unremarkable. Other: None. IMPRESSION: 1. No acute intracranial abnormality. 2. No acute displaced fracture or traumatic listhesis of the cervical spine. 3. appearing bulbous expansion of the helix and lobule of the left external ear. Correlate with physical exam. Electronically Signed   By: Iven Finn M.D.   On: 09/06/2022 21:32    Procedures Procedures  {Document cardiac monitor, telemetry assessment procedure when  appropriate:1}  Medications Ordered in ED Medications - No data to display  ED Course/ Medical Decision Making/ A&P   {   Click here for ABCD2, HEART and other calculatorsREFRESH Note before signing :1}                          Medical Decision Making Amount and/or Complexity of Data Reviewed Radiology: ordered.   Patient with contusion to forehead.  She will follow-up as needed {Document critical care time when appropriate:1} {Document review of labs and clinical decision tools ie heart score, Chads2Vasc2 etc:1}  {Document your independent review of radiology images, and any outside records:1} {Document your discussion with family members, caretakers, and with consultants:1} {Document social determinants of health affecting pt's care:1} {Document your decision making why or why not admission, treatments were needed:1} Final Clinical Impression(s) / ED Diagnoses Final diagnoses:  Fall, initial encounter    Rx / DC Orders ED Discharge Orders     None

## 2022-09-06 NOTE — ED Triage Notes (Addendum)
Pt brought in by [facility] from [facility] with c/o falling while getting out of bed. Pt hit posterior aspect head on left side. Fall was unwitnessed, No LOC. Pt takes blood thinners. According to paperwork, pt is hospice pt and DNR, but facility did not send yellow DNR paper.

## 2022-09-06 NOTE — ED Notes (Signed)
Patient transported to CT 

## 2022-11-17 ENCOUNTER — Ambulatory Visit (INDEPENDENT_AMBULATORY_CARE_PROVIDER_SITE_OTHER): Payer: Medicare Other

## 2022-11-17 DIAGNOSIS — I495 Sick sinus syndrome: Secondary | ICD-10-CM

## 2022-11-21 LAB — CUP PACEART REMOTE DEVICE CHECK
Battery Remaining Longevity: 42 mo
Battery Voltage: 2.97 V
Brady Statistic AP VP Percent: 0.03 %
Brady Statistic AP VS Percent: 32.55 %
Brady Statistic AS VP Percent: 0.03 %
Brady Statistic AS VS Percent: 67.4 %
Brady Statistic RA Percent Paced: 32.11 %
Brady Statistic RV Percent Paced: 0.06 %
Date Time Interrogation Session: 20240610160633
Implantable Lead Connection Status: 753985
Implantable Lead Connection Status: 753985
Implantable Lead Implant Date: 20170320
Implantable Lead Implant Date: 20170320
Implantable Lead Location: 753859
Implantable Lead Location: 753860
Implantable Lead Model: 5076
Implantable Lead Model: 5076
Implantable Pulse Generator Implant Date: 20170320
Lead Channel Impedance Value: 304 Ohm
Lead Channel Impedance Value: 323 Ohm
Lead Channel Impedance Value: 437 Ohm
Lead Channel Impedance Value: 437 Ohm
Lead Channel Pacing Threshold Amplitude: 1 V
Lead Channel Pacing Threshold Amplitude: 1.375 V
Lead Channel Pacing Threshold Pulse Width: 0.4 ms
Lead Channel Pacing Threshold Pulse Width: 0.4 ms
Lead Channel Sensing Intrinsic Amplitude: 13.5 mV
Lead Channel Sensing Intrinsic Amplitude: 13.5 mV
Lead Channel Sensing Intrinsic Amplitude: 2.25 mV
Lead Channel Sensing Intrinsic Amplitude: 2.25 mV
Lead Channel Setting Pacing Amplitude: 2 V
Lead Channel Setting Pacing Amplitude: 2.75 V
Lead Channel Setting Pacing Pulse Width: 0.4 ms
Lead Channel Setting Sensing Sensitivity: 2.8 mV
Zone Setting Status: 755011
Zone Setting Status: 755011

## 2022-12-05 NOTE — Progress Notes (Signed)
Remote pacemaker transmission.   

## 2022-12-23 ENCOUNTER — Encounter (HOSPITAL_COMMUNITY): Payer: Self-pay | Admitting: Emergency Medicine

## 2022-12-23 ENCOUNTER — Emergency Department (HOSPITAL_COMMUNITY)

## 2022-12-23 ENCOUNTER — Emergency Department (HOSPITAL_COMMUNITY)
Admission: EM | Admit: 2022-12-23 | Discharge: 2022-12-23 | Disposition: A | Discharge status: Home / Self Care | Attending: Emergency Medicine | Admitting: Emergency Medicine

## 2022-12-23 ENCOUNTER — Other Ambulatory Visit: Payer: Self-pay

## 2022-12-23 DIAGNOSIS — M25551 Pain in right hip: Secondary | ICD-10-CM | POA: Diagnosis present

## 2022-12-23 DIAGNOSIS — M1611 Unilateral primary osteoarthritis, right hip: Secondary | ICD-10-CM | POA: Diagnosis not present

## 2022-12-23 DIAGNOSIS — W19XXXA Unspecified fall, initial encounter: Secondary | ICD-10-CM | POA: Insufficient documentation

## 2022-12-23 DIAGNOSIS — F0390 Unspecified dementia without behavioral disturbance: Secondary | ICD-10-CM | POA: Insufficient documentation

## 2022-12-23 LAB — BASIC METABOLIC PANEL
Anion gap: 10 (ref 5–15)
BUN: 35 mg/dL — ABNORMAL HIGH (ref 8–23)
CO2: 24 mmol/L (ref 22–32)
Calcium: 10.6 mg/dL — ABNORMAL HIGH (ref 8.9–10.3)
Chloride: 105 mmol/L (ref 98–111)
Creatinine, Ser: 1.32 mg/dL — ABNORMAL HIGH (ref 0.44–1.00)
GFR, Estimated: 38 mL/min — ABNORMAL LOW (ref >60)
Glucose, Bld: 126 mg/dL — ABNORMAL HIGH (ref 70–99)
Potassium: 3.7 mmol/L (ref 3.5–5.1)
Sodium: 139 mmol/L (ref 135–145)

## 2022-12-23 LAB — CBC WITH DIFFERENTIAL/PLATELET
Abs Immature Granulocytes: 0.03 10*3/uL (ref 0.00–0.07)
Basophils Absolute: 0 10*3/uL (ref 0.0–0.1)
Basophils Relative: 0 %
Eosinophils Absolute: 0 10*3/uL (ref 0.0–0.5)
Eosinophils Relative: 0 %
HCT: 37.3 % (ref 36.0–46.0)
Hemoglobin: 11.8 g/dL — ABNORMAL LOW (ref 12.0–15.0)
Immature Granulocytes: 0 %
Lymphocytes Relative: 14 %
Lymphs Abs: 1.5 10*3/uL (ref 0.7–4.0)
MCH: 29.3 pg (ref 26.0–34.0)
MCHC: 31.6 g/dL (ref 30.0–36.0)
MCV: 92.6 fL (ref 80.0–100.0)
Monocytes Absolute: 0.7 10*3/uL (ref 0.1–1.0)
Monocytes Relative: 7 %
Neutro Abs: 8.7 10*3/uL — ABNORMAL HIGH (ref 1.7–7.7)
Neutrophils Relative %: 79 %
Platelets: 269 10*3/uL (ref 150–400)
RBC: 4.03 MIL/uL (ref 3.87–5.11)
RDW: 14 % (ref 11.5–15.5)
WBC: 10.9 10*3/uL — ABNORMAL HIGH (ref 4.0–10.5)
nRBC: 0 % (ref 0.0–0.2)

## 2022-12-23 NOTE — ED Triage Notes (Signed)
Pt to ER via EMS from [facility] after an unwitnessed fall.  Pt c/o pain all over, but mostly in the right leg.

## 2022-12-23 NOTE — Discharge Instructions (Signed)
Take Tylenol for pain and follow-up with your doctor as needed 

## 2022-12-23 NOTE — ED Notes (Signed)
Pt is Hospice pt for heart failure and kidney disease, has opted for no further treatment.  Primary contact is Son, Zanaria Nieuwenhuis.

## 2022-12-25 NOTE — ED Provider Notes (Signed)
[place] EMERGENCY DEPARTMENT AT [facility] Provider Note   CSN: [id number] Arrival date & time: 12/23/22  5366     History  Chief Complaint  Patient presents with   Jodi Peters is a 89 y.o. female.  Patient with dementia and a fall.  She has right hip pain  The history is provided by the patient and medical records. No language interpreter was used.  Fall This is a new problem. The current episode started 1 to 2 hours ago. The problem occurs rarely. The problem has been resolved. Pertinent negatives include no chest pain, no abdominal pain and no headaches. Nothing aggravates the symptoms. Nothing relieves the symptoms.       Home Medications Prior to Admission medications   Medication Sig Start Date End Date Taking? Authorizing Provider  acetaminophen (TYLENOL) 500 MG tablet Take 500 mg by mouth every 6 (six) hours as needed for mild pain or moderate pain. Patient not taking: Reported on 05/08/2022    [provider]  acetaminophen (TYLENOL) 650 MG CR tablet Take 650 mg by mouth every 8 (eight) hours as needed for pain.    [provider]  carvedilol (COREG) 12.5 MG tablet Take 1 tablet (12.5 mg total) by mouth 2 (two) times daily with a meal. 04/15/22   Tat, Onalee Hua, MD  clopidogrel (PLAVIX) 75 MG tablet Take 1 tablet (75 mg total) by mouth daily. 06/23/15   Benetta Spar, MD  diclofenac Sodium (VOLTAREN) 1 % GEL Apply 2 g topically 4 (four) times daily. 04/15/22   Catarina Hartshorn, MD  gabapentin (NEURONTIN) 100 MG capsule Take 100 mg by mouth 3 (three) times daily. 03/28/22   [provider]  hydrALAZINE (APRESOLINE) 100 MG tablet Take 1 tablet (100 mg total) by mouth 3 (three) times daily. 04/12/22   Catarina Hartshorn, MD  ibuprofen (ADVIL) 200 MG tablet Take 200 mg by mouth every 6 (six) hours as needed.    [provider]  lisinopril (ZESTRIL) 40 MG tablet Take 1 tablet (40 mg total) by mouth daily. 04/16/22   Catarina Hartshorn,  MD  mirtazapine (REMERON) 15 MG tablet Take 15 mg by mouth at bedtime.    [provider]  montelukast (SINGULAIR) 10 MG tablet Take 10 mg by mouth daily. 03/17/22   [provider]  MYRBETRIQ 50 MG TB24 tablet Take 50 mg by mouth daily. 07/14/21   [provider]  polyethylene glycol (MIRALAX / GLYCOLAX) 17 g packet Take 17 g by mouth daily. 04/16/22   Catarina Hartshorn, MD  predniSONE (DELTASONE) 10 MG tablet TAKE (1) TABLET BY MOUTH (3) TIMES DAILY. 07/10/22   Vickki Hearing, MD  senna (SENOKOT) 8.6 MG TABS tablet Take 2 tablets (17.2 mg total) by mouth daily. 04/16/22   Catarina Hartshorn, MD  sertraline (ZOLOFT) 25 MG tablet Take 25 mg by mouth every morning. 07/06/21   [provider]  simvastatin (ZOCOR) 20 MG tablet Take 20 mg by mouth daily at 6 PM.  07/13/15   [provider]  spironolactone (ALDACTONE) 25 MG tablet Take 0.5 tablets (12.5 mg total) by mouth daily. 04/16/22   Catarina Hartshorn, MD  vitamin B-12 (CYANOCOBALAMIN) 100 MCG tablet Take 100 mcg by mouth daily.    [provider]      Allergies    Penicillins    Review of Systems   Review of Systems  Constitutional:  Negative for appetite change and fatigue.  HENT:  Negative for  congestion, ear discharge and sinus pressure.   Eyes:  Negative for discharge.  Respiratory:  Negative for cough.   Cardiovascular:  Negative for chest pain.  Gastrointestinal:  Negative for abdominal pain and diarrhea.  Genitourinary:  Negative for frequency and hematuria.  Musculoskeletal:  Negative for back pain.       Right hip pain  Skin:  Negative for rash.  Neurological:  Negative for seizures and headaches.  Psychiatric/Behavioral:  Negative for hallucinations.     Physical Exam Updated Vital Signs BP (!) 221/82   Pulse 92   Temp 98.1 F (36.7 C) (Oral)   Resp (!) 22   Ht 5\' 4"  (1.626 m)   Wt 55 kg   SpO2 94%   BMI 20.81 kg/m  Physical Exam Vitals and nursing note reviewed.  Constitutional:       Appearance: She is well-developed.  HENT:     Head: Normocephalic.     Nose: Nose normal.  Eyes:     General: No scleral icterus.    Conjunctiva/sclera: Conjunctivae normal.  Neck:     Thyroid: No thyromegaly.  Cardiovascular:     Rate and Rhythm: Normal rate and regular rhythm.     Heart sounds: No murmur heard.    No friction rub. No gallop.  Pulmonary:     Breath sounds: No stridor. No wheezing or rales.  Chest:     Chest wall: No tenderness.  Abdominal:     General: There is no distension.     Tenderness: There is no abdominal tenderness. There is no rebound.  Musculoskeletal:     Cervical back: Neck supple.     Comments: Tender right hip  Lymphadenopathy:     Cervical: No cervical adenopathy.  Skin:    Findings: No erythema or rash.  Neurological:     Mental Status: She is alert.     Motor: No abnormal muscle tone.     Coordination: Coordination normal.     Comments: Oriented to person, only this is her normal  Psychiatric:        Behavior: Behavior normal.     ED Results / Procedures / Treatments   Labs (all labs ordered are listed, but only abnormal results are displayed) Labs Reviewed  CBC WITH DIFFERENTIAL/PLATELET - Abnormal; Notable for the following components:      Result Value   WBC 10.9 (*)    Hemoglobin 11.8 (*)    Neutro Abs 8.7 (*)    All other components within normal limits  BASIC METABOLIC PANEL - Abnormal; Notable for the following components:   Glucose, Bld 126 (*)    BUN 35 (*)    Creatinine, Ser 1.32 (*)    Calcium 10.6 (*)    GFR, Estimated 38 (*)    All other components within normal limits    EKG EKG Interpretation Date/Time:  Saturday December 23 2022 08:51:36 EDT Ventricular Rate:  79 PR Interval:  161 QRS Duration:  97 QT Interval:  418 QTC Calculation: 480 R Axis:   27  Text Interpretation: Sinus rhythm Probable left ventricular hypertrophy Anterior Q waves, possibly due to LVH No significant change since last tracing  Confirmed by Gwyneth Sprout ([id number]) on 12/24/2022 10:26:55 PM  Radiology No results found.  Procedures Procedures    Medications Ordered in ED Medications - No data to display  ED Course/ Medical Decision Making/ A&P  Medical Decision Making Amount and/or Complexity of Data Reviewed Labs: ordered. Radiology: ordered.  Patient with CT scan of the right hip which shows osteoarthritis but no fracture.  Plain films also show no fracture.  Patient with a fall and contusion to right hip.  She will follow-up with her doctor as needed        Final Clinical Impression(s) / ED Diagnoses Final diagnoses:  Fall, initial encounter    Rx / DC Orders ED Discharge Orders     None         Bethann Berkshire, MD 12/25/22 1710

## 2022-12-26 ENCOUNTER — Other Ambulatory Visit: Payer: Self-pay

## 2022-12-26 ENCOUNTER — Encounter (HOSPITAL_COMMUNITY): Payer: Self-pay | Admitting: Emergency Medicine

## 2022-12-26 ENCOUNTER — Emergency Department (HOSPITAL_COMMUNITY): Payer: Medicare Other

## 2022-12-26 ENCOUNTER — Emergency Department (HOSPITAL_COMMUNITY)
Admission: EM | Admit: 2022-12-26 | Discharge: 2023-01-04 | Disposition: A | Discharge status: Home / Self Care | Payer: Medicare Other | Attending: Emergency Medicine | Admitting: Emergency Medicine

## 2022-12-26 DIAGNOSIS — I10 Essential (primary) hypertension: Secondary | ICD-10-CM | POA: Diagnosis not present

## 2022-12-26 DIAGNOSIS — F0390 Unspecified dementia without behavioral disturbance: Secondary | ICD-10-CM | POA: Insufficient documentation

## 2022-12-26 DIAGNOSIS — Z853 Personal history of malignant neoplasm of breast: Secondary | ICD-10-CM | POA: Insufficient documentation

## 2022-12-26 DIAGNOSIS — S0990XA Unspecified injury of head, initial encounter: Secondary | ICD-10-CM | POA: Insufficient documentation

## 2022-12-26 DIAGNOSIS — Z79899 Other long term (current) drug therapy: Secondary | ICD-10-CM | POA: Diagnosis not present

## 2022-12-26 DIAGNOSIS — W01198A Fall on same level from slipping, tripping and stumbling with subsequent striking against other object, initial encounter: Secondary | ICD-10-CM | POA: Diagnosis not present

## 2022-12-26 DIAGNOSIS — Z7902 Long term (current) use of antithrombotics/antiplatelets: Secondary | ICD-10-CM | POA: Insufficient documentation

## 2022-12-26 DIAGNOSIS — R531 Weakness: Secondary | ICD-10-CM | POA: Diagnosis not present

## 2022-12-26 DIAGNOSIS — R410 Disorientation, unspecified: Secondary | ICD-10-CM | POA: Diagnosis not present

## 2022-12-26 LAB — URINALYSIS, MICROSCOPIC (REFLEX): RBC / HPF: NONE SEEN RBC/hpf (ref 0–5)

## 2022-12-26 LAB — CBC WITH DIFFERENTIAL/PLATELET
Abs Immature Granulocytes: 0.02 10*3/uL (ref 0.00–0.07)
Basophils Absolute: 0.1 10*3/uL (ref 0.0–0.1)
Basophils Relative: 1 %
Eosinophils Absolute: 0.3 10*3/uL (ref 0.0–0.5)
Eosinophils Relative: 4 %
HCT: 32.5 % — ABNORMAL LOW (ref 36.0–46.0)
Hemoglobin: 10.3 g/dL — ABNORMAL LOW (ref 12.0–15.0)
Immature Granulocytes: 0 %
Lymphocytes Relative: 26 %
Lymphs Abs: 2.2 10*3/uL (ref 0.7–4.0)
MCH: 29.2 pg (ref 26.0–34.0)
MCHC: 31.7 g/dL (ref 30.0–36.0)
MCV: 92.1 fL (ref 80.0–100.0)
Monocytes Absolute: 1.2 10*3/uL — ABNORMAL HIGH (ref 0.1–1.0)
Monocytes Relative: 14 %
Neutro Abs: 4.9 10*3/uL (ref 1.7–7.7)
Neutrophils Relative %: 55 %
Platelets: 233 10*3/uL (ref 150–400)
RBC: 3.53 MIL/uL — ABNORMAL LOW (ref 3.87–5.11)
RDW: 14 % (ref 11.5–15.5)
WBC: 8.7 10*3/uL (ref 4.0–10.5)
nRBC: 0 % (ref 0.0–0.2)

## 2022-12-26 LAB — COMPREHENSIVE METABOLIC PANEL
ALT: 13 U/L (ref 0–44)
AST: 16 U/L (ref 15–41)
Albumin: 3.4 g/dL — ABNORMAL LOW (ref 3.5–5.0)
Alkaline Phosphatase: 70 U/L (ref 38–126)
Anion gap: 9 (ref 5–15)
BUN: 65 mg/dL — ABNORMAL HIGH (ref 8–23)
CO2: 24 mmol/L (ref 22–32)
Calcium: 9.8 mg/dL (ref 8.9–10.3)
Chloride: 106 mmol/L (ref 98–111)
Creatinine, Ser: 1.51 mg/dL — ABNORMAL HIGH (ref 0.44–1.00)
GFR, Estimated: 32 mL/min — ABNORMAL LOW (ref >60)
Glucose, Bld: 111 mg/dL — ABNORMAL HIGH (ref 70–99)
Potassium: 3.7 mmol/L (ref 3.5–5.1)
Sodium: 139 mmol/L (ref 135–145)
Total Bilirubin: 0.7 mg/dL (ref 0.3–1.2)
Total Protein: 6.5 g/dL (ref 6.5–8.1)

## 2022-12-26 LAB — URINALYSIS, ROUTINE W REFLEX MICROSCOPIC
Glucose, UA: NEGATIVE mg/dL
Hgb urine dipstick: NEGATIVE
Ketones, ur: NEGATIVE mg/dL
Leukocytes,Ua: NEGATIVE
Nitrite: NEGATIVE
Protein, ur: >300 mg/dL — AB
Specific Gravity, Urine: >1.03 — ABNORMAL HIGH (ref 1.005–1.030)
pH: 5.5 (ref 5.0–8.0)

## 2022-12-26 LAB — CBG MONITORING, ED: Glucose-Capillary: 104 mg/dL — ABNORMAL HIGH (ref 70–99)

## 2022-12-26 LAB — MAGNESIUM: Magnesium: 2.5 mg/dL — ABNORMAL HIGH (ref 1.7–2.4)

## 2022-12-26 MED ORDER — LACTATED RINGERS IV BOLUS
500.0000 mL | Freq: Once | INTRAVENOUS | Status: AC
  Administered 2022-12-26: 500 mL via INTRAVENOUS

## 2022-12-26 NOTE — ED Notes (Signed)
Pt was able to drink a few sips of water and 4-5 spoon fulls of apple sauce. Denies any other needs at this time.

## 2022-12-26 NOTE — ED Notes (Signed)
Attempted to call son per patient request. Son did not answer.

## 2022-12-26 NOTE — ED Notes (Signed)
Spoke with Hospice nurse and she states that patient family wants "whatever needs to be done" for the patient. Family no longer wants hospice services and they would like for her to be admitted so that she is able to find other placement besides [facility]. Per hospice nurse, nursing facility does not want patient to come back to their facility and that they will keep sending her back up here until other placement for her is found. Will inform EDP.

## 2022-12-26 NOTE — ED Notes (Signed)
Spoke to [place], the after hours triage nurse for [facility]. Misty Stanley felt more comfortable messaging the local hospice nurse to discuss code and treatment status. The local nurse is to call or come to the Emergency department.

## 2022-12-26 NOTE — ED Notes (Signed)
Requested po fluids from MD for patient.

## 2022-12-26 NOTE — ED Notes (Signed)
Updated MD on discussion with hospice.

## 2022-12-26 NOTE — ED Notes (Signed)
Unable to obtain IV access for labs. No IV has been ordered at this time. Lab has been contacted to obtain blood samples.

## 2022-12-26 NOTE — ED Triage Notes (Signed)
Pt Jodi Peters from [place] [facility] with reports of fall 3 days ago. Peters reports pt did hit her head. Peters also reports increased confusion and weakness x 3 days.

## 2022-12-26 NOTE — ED Provider Notes (Signed)
[place] EMERGENCY DEPARTMENT AT [facility] Provider Note   CSN: [id number] Arrival date & time: 12/26/22  1430     History {Add pertinent medical, surgical, social history, OB history to HPI:1} Chief Complaint  Patient presents with  . Weakness    Jodi Peters is a 89 y.o. female with dementia, PMH as listed below who presents BIB [facility] from [facility] with reports of fall 3 days ago. EMS reports pt did hit her head. EMS also reports increased confusion and weakness x 3 days. On my assessment of pt she is A&Ox1. She states she needs her son here with her. Endorses pain in her bottom at the site of a keloid. She denies CP, SOB, N/V.   Attempted to contact patient's son Dailyn Reith [phone], but got voice mail and it said number out of service. Called patient's son Vikki Gains [phone] but no answer. HIPAA compliant voicemail left. Called patient's daughter Rosha Cocker, who stated that her brother Chrissie Noa said she was confused. She fell on Saturday 7/13 and was evaluated. [facility]ney saw patient on Sunday 7/14 and was not acting like herself. Today Chrissie Noa thought she may have had another stroke based on the way her eyes were "fixated" so told [facility], who suggested she go to the ED for evaluation. They did not call the hospice center as previously instructed. Correct phone number for Chrissie Noa is [phone]. Stanton Kidney and Chrissie Noa are co-HCPOA's for patient. [facility] Kidney unclear about how much investigation they would like for her.    Past Medical History:  Diagnosis Date  . Allergic rhinitis   . Anxiety   . Bradycardia   . Breast cancer (HCC)   . Breast mass    left  . Breast pain    leftr  . Carotid atherosclerosis   . Carpal tunnel syndrome   . Cataract   . Constipation   . Cystocele 06/30/2014  . Glaucoma   . Hyperlipidemia   . Hypertension   . IBS (irritable bowel syndrome)   . Low back pain   . Mitral regurgitation    sinus bradycardia  .  Osteoarthritis   . Osteopenia   . Rectocele 06/30/2014  . Seborrheic keratosis   . Venous insufficiency   . Vertigo   . Vitamin D deficiency        Home Medications Prior to Admission medications   Medication Sig Start Date End Date Taking? Authorizing Provider  acetaminophen (TYLENOL) 500 MG tablet Take 500 mg by mouth every 6 (six) hours as needed for mild pain or moderate pain. Patient not taking: Reported on 05/08/2022    [provider]  acetaminophen (TYLENOL) 650 MG CR tablet Take 650 mg by mouth every 8 (eight) hours as needed for pain.    [provider]  carvedilol (COREG) 12.5 MG tablet Take 1 tablet (12.5 mg total) by mouth 2 (two) times daily with a meal. 04/15/22   Tat, Onalee Hua, MD  clopidogrel (PLAVIX) 75 MG tablet Take 1 tablet (75 mg total) by mouth daily. 06/23/15   Benetta Spar, MD  diclofenac Sodium (VOLTAREN) 1 % GEL Apply 2 g topically 4 (four) times daily. 04/15/22   Catarina Hartshorn, MD  gabapentin (NEURONTIN) 100 MG capsule Take 100 mg by mouth 3 (three) times daily. 03/28/22   [provider]  hydrALAZINE (APRESOLINE) 100 MG tablet Take 1 tablet (100 mg total) by mouth 3 (three) times daily. 04/12/22   Catarina Hartshorn, MD  ibuprofen (ADVIL) 200 MG tablet  Take 200 mg by mouth every 6 (six) hours as needed.    [provider]  lisinopril (ZESTRIL) 40 MG tablet Take 1 tablet (40 mg total) by mouth daily. 04/16/22   Catarina Hartshorn, MD  mirtazapine (REMERON) 15 MG tablet Take 15 mg by mouth at bedtime.    [provider]  montelukast (SINGULAIR) 10 MG tablet Take 10 mg by mouth daily. 03/17/22   [provider]  MYRBETRIQ 50 MG TB24 tablet Take 50 mg by mouth daily. 07/14/21   [provider]  polyethylene glycol (MIRALAX / GLYCOLAX) 17 g packet Take 17 g by mouth daily. 04/16/22   Catarina Hartshorn, MD  predniSONE (DELTASONE) 10 MG tablet TAKE (1) TABLET BY MOUTH (3) TIMES DAILY. 07/10/22   Vickki Hearing, MD  senna  (SENOKOT) 8.6 MG TABS tablet Take 2 tablets (17.2 mg total) by mouth daily. 04/16/22   Catarina Hartshorn, MD  sertraline (ZOLOFT) 25 MG tablet Take 25 mg by mouth every morning. 07/06/21   [provider]  simvastatin (ZOCOR) 20 MG tablet Take 20 mg by mouth daily at 6 PM.  07/13/15   [provider]  spironolactone (ALDACTONE) 25 MG tablet Take 0.5 tablets (12.5 mg total) by mouth daily. 04/16/22   Catarina Hartshorn, MD  vitamin B-12 (CYANOCOBALAMIN) 100 MCG tablet Take 100 mcg by mouth daily.    [provider]      Allergies    Penicillins    Review of Systems   Review of Systems A 10 point review of systems was performed and is negative unless otherwise reported in HPI.  Physical Exam Updated Vital Signs There were no vitals taken for this visit. Physical Exam General: Normal appearing {Desc; female/female:11659}, lying in bed.  HEENT: PERRLA, Sclera anicteric, MMM, trachea midline.  Cardiology: RRR, no murmurs/rubs/gallops. BL radial and DP pulses equal bilaterally.  Resp: Normal respiratory rate and effort. CTAB, no wheezes, rhonchi, crackles.  Abd: Soft, non-tender, non-distended. No rebound tenderness or guarding.  GU: Deferred. MSK: No peripheral edema or signs of trauma. Extremities without deformity or TTP. No cyanosis or clubbing. Skin: warm, dry. No rashes or lesions. Back: No CVA tenderness Neuro: A&Ox4, CNs II-XII grossly intact. MAEs. Sensation grossly intact.  Psych: Normal mood and affect.   ED Results / Procedures / Treatments   Labs (all labs ordered are listed, but only abnormal results are displayed) Labs Reviewed  URINALYSIS, ROUTINE W REFLEX MICROSCOPIC    EKG None  Radiology No results found.  Procedures Procedures  {Document cardiac monitor, telemetry assessment procedure when appropriate:1}  Medications Ordered in ED Medications - No data to display  ED Course/ Medical Decision Making/ A&P                          Medical Decision  Making Amount and/or Complexity of Data Reviewed Labs: ordered. Decision-making details documented in ED Course.    This patient presents to the ED for concern of ***, this involves an extensive number of treatment options, and is a complaint that carries with it a high risk of complications and morbidity.  I considered the following differential and admission for this acute, potentially life threatening condition.   MDM:    ***  Clinical Course as of 12/26/22 1803  Tue Dec 26, 2022  1634 Glucose-Capillary(!): 104 [HN]  1634 Urinalysis, Routine w reflex microscopic -Urine, Clean Catch(!) No UTI but likely dehydrated [HN]  1755 Discussed with patient's son Chrissie Noa  who states that he facility stated they were concerned she may have had a stroke. He is unclear what they saw as well but does note that she has been more confused lately. Unclear what they would like to be done. Very very low likelihood with patient being on hospice that if she had a ICH there would be operative intervention. Will call and discuss with hospice. [HN]    Clinical Course User Index [HN] Loetta Rough, MD    Labs: I Ordered, and personally interpreted labs.  The pertinent results include:  ***  Imaging Studies ordered: I ordered imaging studies including *** I independently visualized and interpreted imaging. I agree with the radiologist interpretation  Additional history obtained from ***.  External records from outside source obtained and reviewed including ***  Cardiac Monitoring: .The patient was maintained on a cardiac monitor.  I personally viewed and interpreted the cardiac monitored which showed an underlying rhythm of: ***  Reevaluation: After the interventions noted above, I reevaluated the patient and found that they have :{resolved/improved/worsened:23923::"improved"}  Social Determinants of Health: .***  Disposition:  ***  Co morbidities that complicate the patient evaluation . Past  Medical History:  Diagnosis Date  . Allergic rhinitis   . Anxiety   . Bradycardia   . Breast cancer (HCC)   . Breast mass    left  . Breast pain    leftr  . Carotid atherosclerosis   . Carpal tunnel syndrome   . Cataract   . Constipation   . Cystocele 06/30/2014  . Glaucoma   . Hyperlipidemia   . Hypertension   . IBS (irritable bowel syndrome)   . Low back pain   . Mitral regurgitation    sinus bradycardia  . Osteoarthritis   . Osteopenia   . Rectocele 06/30/2014  . Seborrheic keratosis   . Venous insufficiency   . Vertigo   . Vitamin D deficiency      Medicines No orders of the defined types were placed in this encounter.   I have reviewed the patients home medicines and have made adjustments as needed  Problem List / ED Course: Problem List Items Addressed This Visit   None        {Document critical care time when appropriate:1} {Document review of labs and clinical decision tools ie heart score, Chads2Vasc2 etc:1}  {Document your independent review of radiology images, and any outside records:1} {Document your discussion with family members, caretakers, and with consultants:1} {Document social determinants of health affecting pt's care:1} {Document your decision making why or why not admission, treatments were needed:1}  This note was created using dictation software, which may contain spelling or grammatical errors.

## 2022-12-26 NOTE — ED Notes (Signed)
Called pt other son, per pt request. No answer.

## 2022-12-26 NOTE — ED Notes (Signed)
Spoke with hospice on plan for patient. Hospice nurse stated that [facility] is trying to get "her out of the facility." She was confused as to why because a meeting was had yesterday with family and the facility and everyone was in agreement for what the plan will be for this patient. Family is not giving answers as far what they want done for her here today. The hospice nurse stated she would call the family and discuss again what they would like and what the policies are as far as maintaining her eligability and the financial responsibility of the family.  Family is to call  [facility] after discussion with hospice.

## 2022-12-27 MED ORDER — MONTELUKAST SODIUM 10 MG PO TABS
10.0000 mg | ORAL_TABLET | Freq: Every day | ORAL | Status: DC
  Administered 2022-12-27 – 2023-01-04 (×8): 10 mg via ORAL
  Filled 2022-12-27 (×8): qty 1

## 2022-12-27 MED ORDER — LISINOPRIL 10 MG PO TABS
40.0000 mg | ORAL_TABLET | Freq: Every day | ORAL | Status: DC
  Administered 2022-12-27 – 2023-01-04 (×5): 40 mg via ORAL
  Filled 2022-12-27 (×7): qty 4

## 2022-12-27 MED ORDER — MELATONIN 3 MG PO TABS
3.0000 mg | ORAL_TABLET | Freq: Every day | ORAL | Status: DC
  Administered 2022-12-27 – 2023-01-03 (×7): 3 mg via ORAL
  Filled 2022-12-27 (×7): qty 1

## 2022-12-27 MED ORDER — MIRABEGRON ER 25 MG PO TB24
50.0000 mg | ORAL_TABLET | Freq: Every day | ORAL | Status: DC
  Administered 2022-12-27 – 2023-01-04 (×8): 50 mg via ORAL
  Filled 2022-12-27 (×8): qty 2

## 2022-12-27 MED ORDER — SPIRONOLACTONE 12.5 MG HALF TABLET
12.5000 mg | ORAL_TABLET | Freq: Every day | ORAL | Status: DC
  Administered 2022-12-27 – 2023-01-04 (×8): 12.5 mg via ORAL
  Filled 2022-12-27 (×8): qty 1

## 2022-12-27 MED ORDER — CARVEDILOL 12.5 MG PO TABS
12.5000 mg | ORAL_TABLET | Freq: Two times a day (BID) | ORAL | Status: DC
  Administered 2022-12-27 – 2023-01-04 (×13): 12.5 mg via ORAL
  Filled 2022-12-27 (×3): qty 1
  Filled 2022-12-27: qty 4
  Filled 2022-12-27 (×12): qty 1

## 2022-12-27 MED ORDER — ACETAMINOPHEN 325 MG PO TABS
650.0000 mg | ORAL_TABLET | Freq: Four times a day (QID) | ORAL | Status: DC | PRN
  Administered 2022-12-27 – 2023-01-01 (×3): 650 mg via ORAL
  Filled 2022-12-27 (×3): qty 2

## 2022-12-27 MED ORDER — SIMVASTATIN 10 MG PO TABS
20.0000 mg | ORAL_TABLET | Freq: Every day | ORAL | Status: DC
  Administered 2022-12-28 – 2023-01-03 (×5): 20 mg via ORAL
  Filled 2022-12-27 (×8): qty 2

## 2022-12-27 MED ORDER — HYDRALAZINE HCL 25 MG PO TABS
100.0000 mg | ORAL_TABLET | Freq: Three times a day (TID) | ORAL | Status: DC
  Administered 2022-12-27 – 2023-01-04 (×11): 100 mg via ORAL
  Filled 2022-12-27 (×18): qty 4

## 2022-12-27 MED ORDER — POLYETHYLENE GLYCOL 3350 17 G PO PACK
17.0000 g | PACK | Freq: Every day | ORAL | Status: DC
  Administered 2022-12-27 – 2023-01-02 (×6): 17 g via ORAL
  Filled 2022-12-27 (×6): qty 1

## 2022-12-27 MED ORDER — CLOPIDOGREL BISULFATE 75 MG PO TABS
75.0000 mg | ORAL_TABLET | Freq: Every day | ORAL | Status: DC
  Administered 2022-12-27 – 2023-01-04 (×8): 75 mg via ORAL
  Filled 2022-12-27 (×8): qty 1

## 2022-12-27 NOTE — ED Provider Notes (Signed)
Emergency Medicine Observation Re-evaluation Note  Jodi Peters is a 89 y.o. female, seen on rounds today.  Pt initially presented to the ED for complaints of Weakness Currently, the patient is resting.  Physical Exam  BP (!) 184/74   Pulse 60   Temp 98.4 F (36.9 C)   Resp 18   Ht 5\' 4"  (1.626 m)   Wt 47.2 kg   SpO2 94%   BMI 17.85 kg/m  Physical Exam General: no acute distress Lungs: normal effort Psych: no agitation  ED Course / MDM  EKG:EKG Interpretation Date/Time:  Tuesday December 26 2022 16:16:35 EDT Ventricular Rate:  67 PR Interval:  157 QRS Duration:  94 QT Interval:  405 QTC Calculation: 428 R Axis:   17  Text Interpretation: Sinus rhythm Consider left ventricular hypertrophy Anterior Q waves, possibly due to LVH Similar to prior EKGs Confirmed by Vivi Barrack [phone]) on 12/26/2022 4:34:09 PM  I have reviewed the labs performed to date as well as medications administered while in observation.  Recent changes in the last 24 hours include home meds.  Plan  Current plan is for talking to her hospice facility and daughter. Ideally would go back to facility to work out new SNF. TOC has tried to contact facility and daughter without success this AM.    Jodi Loveless, MD 12/27/22 [phone]

## 2022-12-27 NOTE — ED Notes (Signed)
Pt removed off the bedpan, no BM noted

## 2022-12-27 NOTE — ED Notes (Signed)
Pt encouraged to eat lunch but states she is not hungry at this time

## 2022-12-27 NOTE — ED Notes (Signed)
Pt placed on bedpan and removed; pt states she needed to have a BM but pt was unablel; pt bottom cleaned and repositioned and socks placed on feet; pt's family member at bedside

## 2022-12-27 NOTE — ED Notes (Signed)
 Pt placed on bedpan

## 2022-12-27 NOTE — ED Notes (Signed)
Pt removed off bedpan and states she is hungry; informed her dinner would be here soon.  Pt given cup of applesauce and pt ate 100% of cup with some sips of water; pt repositioned and covered with blanket

## 2022-12-27 NOTE — ED Notes (Signed)
Dinner tray arrived and pt only eat a couple of bites with some sips of tea and water; pt now placed on bedpan stating she needs to have a BM

## 2022-12-27 NOTE — ED Notes (Signed)
CSW spoke with executive director Ron with [employer] who states that after their MD saw pt it was stated "pt is not fit for the ALF, they cannot manage that type of care". CSW met with pts daughter in the room at bedside to speak about options. Pts daughter states they are interested in LTC and would prefer [facility]. CSW completed referral and sent to facility for review. CSW explained that pt will be private pay at the facility, pts daughter is understanding of this and states they are able to manage this cost. TOC to follow.

## 2022-12-27 NOTE — ED Notes (Addendum)
Pt repositioned in bed due to complaints of right hip and leg hurting. Pillow placed under right hip and pressure taken off of left leg.   Water given to pt

## 2022-12-27 NOTE — NC FL2 (Signed)
[place] MEDICAID FL2 LEVEL OF CARE FORM     IDENTIFICATION  Patient Name: Jodi Peters Birthdate: 1929/06/20 Sex: female Admission Date (Current Location): 12/26/2022  [facility] and [place] Number:  [employer] and Address:  [facility],  [address], [place] [address]      Provider Number: [id number]  Attending Physician Name and Address:  Default, Provider, MD  Relative Name and Phone Number:       Current Level of Care: Hospital Recommended Level of Care: Skilled Nursing Facility Prior Approval Number:    Date Approved/Denied:   PASRR Number:    Discharge Plan: SNF    Current Diagnoses: Patient Active Problem List   Diagnosis Date Noted   AKI (acute kidney injury) (HCC) 04/15/2022   Right hand pain 04/13/2022   Acute encephalopathy 04/11/2022   Acute renal failure superimposed on stage 3b chronic kidney disease (HCC) 04/10/2022   Depression 04/10/2022   Acute metabolic encephalopathy 04/10/2022   Sinus node dysfunction (HCC) 08/30/2015   Near syncope 06/21/2015   Pain in joint, shoulder region 02/05/2015   TIA (transient ischemic attack) 01/26/2015   Leukocytosis 01/18/2015   UTI (urinary tract infection) 12/29/2014   Spinal stenosis of thoracic region    Spinal stenosis 12/22/2014   Spinal cord compression (HCC) 12/22/2014   Essential hypertension 12/22/2014   Spinal stenosis of lumbar region    Rectocele 06/30/2014   Cystocele 06/30/2014   HERPES ZOSTER 01/14/2010   MITRAL REGURGITATION 01/14/2010   Cerebrovascular disease 01/14/2010   INSOMNIA 12/23/2008   OSTEOPENIA 06/01/2008   BRADYCARDIA 01/27/2008   INSUFFICIENCY, VENOUS NOS 01/10/2007   ALLERGIC RHINITIS, SEASONAL 10/10/2006   SEBORRHEIC KERATOSIS 08/08/2006   Mixed hyperlipidemia 05/23/2006   Anxiety state 05/23/2006   CARPAL TUNNEL SYNDROME 05/23/2006   Glaucoma 05/23/2006   CATARACT NOS 05/23/2006   HYPERTENSION 05/23/2006   CONSTIPATION 05/23/2006   IBS  05/23/2006   OSTEOARTHRITIS 05/23/2006   LOW BACK PAIN 05/23/2006   BREAST CANCER, HX OF 05/23/2006    Orientation RESPIRATION BLADDER Height & Weight     Self, Place  Normal Incontinent Weight: 104 lb (47.2 kg) Height:  5\' 4"  (162.6 cm)  BEHAVIORAL SYMPTOMS/MOOD NEUROLOGICAL BOWEL NUTRITION STATUS      Incontinent Diet  AMBULATORY STATUS COMMUNICATION OF NEEDS Skin   Extensive Assist Verbally Normal                       Personal Care Assistance Level of Assistance  Bathing, Feeding, Dressing Bathing Assistance: Maximum assistance Feeding assistance: Maximum assistance Dressing Assistance: Maximum assistance     Functional Limitations Info  Sight, Hearing, Speech Sight Info: Adequate Hearing Info: Adequate Speech Info: Adequate    SPECIAL CARE FACTORS FREQUENCY                       Contractures Contractures Info: Not present    Additional Factors Info  Code Status, Allergies Code Status Info: DNR Allergies Info: Penicillins           Current Medications (12/27/2022):  This is the current hospital active medication list Current Facility-Administered Medications  Medication Dose Route Frequency Provider Last Rate Last Admin   acetaminophen (TYLENOL) tablet 650 mg  650 mg Oral Q6H PRN Loetta Rough, MD       carvedilol (COREG) tablet 12.5 mg  12.5 mg Oral BID WC Loetta Rough, MD   12.5 mg at 12/27/22 0851   clopidogrel (  PLAVIX) tablet 75 mg  75 mg Oral Daily Loetta Rough, MD   75 mg at 12/27/22 1610   hydrALAZINE (APRESOLINE) tablet 100 mg  100 mg Oral TID Loetta Rough, MD   100 mg at 12/27/22 0905   lisinopril (ZESTRIL) tablet 40 mg  40 mg Oral Daily Loetta Rough, MD   40 mg at 12/27/22 9604   melatonin tablet 3 mg  3 mg Oral QHS Loetta Rough, MD       mirabegron ER (MYRBETRIQ) tablet 50 mg  50 mg Oral Daily Loetta Rough, MD   50 mg at 12/27/22 0905   montelukast (SINGULAIR) tablet 10 mg  10 mg Oral Daily Loetta Rough, MD   10  mg at 12/27/22 0852   polyethylene glycol (MIRALAX / GLYCOLAX) packet 17 g  17 g Oral Daily Loetta Rough, MD   17 g at 12/27/22 0905   simvastatin (ZOCOR) tablet 20 mg  20 mg Oral q1800 Loetta Rough, MD       spironolactone (ALDACTONE) tablet 12.5 mg  12.5 mg Oral Daily Loetta Rough, MD   12.5 mg at 12/27/22 5409   Current Outpatient Medications  Medication Sig Dispense Refill   acetaminophen (TYLENOL) 500 MG tablet Take 500 mg by mouth every 6 (six) hours as needed for mild pain or moderate pain. (Patient not taking: Reported on 05/08/2022)     acetaminophen (TYLENOL) 650 MG CR tablet Take 650 mg by mouth every 8 (eight) hours as needed for pain.     carvedilol (COREG) 12.5 MG tablet Take 1 tablet (12.5 mg total) by mouth 2 (two) times daily with a meal. 60 tablet 1   clopidogrel (PLAVIX) 75 MG tablet Take 1 tablet (75 mg total) by mouth daily. 30 tablet 3   diclofenac Sodium (VOLTAREN) 1 % GEL Apply 2 g topically 4 (four) times daily. 4 g 1   gabapentin (NEURONTIN) 100 MG capsule Take 100 mg by mouth 3 (three) times daily.     hydrALAZINE (APRESOLINE) 100 MG tablet Take 1 tablet (100 mg total) by mouth 3 (three) times daily. 90 tablet 1   ibuprofen (ADVIL) 200 MG tablet Take 200 mg by mouth every 6 (six) hours as needed.     lisinopril (ZESTRIL) 40 MG tablet Take 1 tablet (40 mg total) by mouth daily. 30 tablet 1   mirtazapine (REMERON) 15 MG tablet Take 15 mg by mouth at bedtime.     montelukast (SINGULAIR) 10 MG tablet Take 10 mg by mouth daily.     MYRBETRIQ 50 MG TB24 tablet Take 50 mg by mouth daily.     polyethylene glycol (MIRALAX / GLYCOLAX) 17 g packet Take 17 g by mouth daily. 14 each 0   predniSONE (DELTASONE) 10 MG tablet TAKE (1) TABLET BY MOUTH (3) TIMES DAILY. 42 tablet 0   senna (SENOKOT) 8.6 MG TABS tablet Take 2 tablets (17.2 mg total) by mouth daily. 120 tablet 0   sertraline (ZOLOFT) 25 MG tablet Take 25 mg by mouth every morning.     simvastatin (ZOCOR) 20 MG  tablet Take 20 mg by mouth daily at 6 PM.      spironolactone (ALDACTONE) 25 MG tablet Take 0.5 tablets (12.5 mg total) by mouth daily. 30 tablet 1   vitamin B-12 (CYANOCOBALAMIN) 100 MCG tablet Take 100 mcg by mouth daily.       Discharge Medications: Please see discharge summary for a list of discharge medications.  Relevant Imaging Results:  Relevant Lab Results:   Additional Information SSN: [id number], [place]

## 2022-12-27 NOTE — ED Notes (Signed)
TOC consulted for SNF placement. Pt is from [facility] CSW left VM for pts daughter requesting call back. CSW left VM for Ginger with [facility] requesting call back. CSW also spoke to [place] with [facility], she will be coming to the hospital to visit with pt and family. TOC to follow for updates once return calls have been received.

## 2022-12-28 NOTE — ED Notes (Signed)
Pt repositioned in bed and pressure taken off left side

## 2022-12-28 NOTE — ED Notes (Signed)
Spoke with the assistant living facility about pt. Per pt status in the ED informed facility pt has required 2 staff assistance, pt does not walk for staff, pt switched from a regular diet to a mechanical soft due to pt unable to chew meats.

## 2022-12-28 NOTE — ED Notes (Signed)
This CSW and [facility] supervisor Lafonda Mosses met with pt, daughter and son at bedside to work on a D/C plan. At this time family understands that they need to get paperwork completed for [facility]. They are also in the process of finding pts life insurance policy as facility is requesting documents. Facility states pt cannot be offered a bed until they get all documents in place. CSW updated family of this and request they work on things urgently. TOC to follow.

## 2022-12-28 NOTE — ED Notes (Signed)
Pt placed in hospital bed

## 2022-12-28 NOTE — ED Notes (Addendum)
Pt awake at this time and given medications. Meds crushed in apple sauce and water given. No complaints or needs at this time. Pt repositioned in bed

## 2022-12-28 NOTE — ED Notes (Signed)
Family at bedside. 

## 2022-12-28 NOTE — ED Notes (Signed)
Patients family has a meeting with the [employer] at [employer] today. CSW awaiting results of meeting on how to move forward. TOC to follow.

## 2022-12-28 NOTE — ED Provider Notes (Signed)
Emergency Medicine Observation Re-evaluation Note  Jodi Peters is a 89 y.o. female, seen on rounds today.  Pt initially presented to the ED for complaints of Weakness Currently, the patient is awaiting nursing home placement.  Physical Exam  BP (!) 156/98   Pulse 76   Temp 99.2 F (37.3 C)   Resp 16   Ht 5\' 4"  (1.626 m)   Wt 47.2 kg   SpO2 95%   BMI 17.85 kg/m  Physical Exam Awake and in no acute distress  ED Course / MDM  EKG:EKG Interpretation Date/Time:  Tuesday December 26 2022 16:16:35 EDT Ventricular Rate:  67 PR Interval:  157 QRS Duration:  94 QT Interval:  405 QTC Calculation: 428 R Axis:   17  Text Interpretation: Sinus rhythm Consider left ventricular hypertrophy Anterior Q waves, possibly due to LVH Similar to prior EKGs Confirmed by Vivi Barrack [phone]) on 12/26/2022 4:34:09 PM  I have reviewed the labs performed to date as well as medications administered while in observation.  Recent changes in the last 24 hours include none.  Plan  Current plan is for nursing home placement.    Jodi Berkshire, MD 12/28/22 [phone]

## 2022-12-28 NOTE — ED Notes (Addendum)
Pt changed into clean clothes that were brought by daughter and repositioned in bed. Pt given drink of water.   This RN called for a hospital bed for patient due to her c/o back and buttocks pain.   Pt repositioned in bed. Sacral pad applied to pt. No sores but redness from patient being bony. Applied for added cushion and support.

## 2022-12-29 NOTE — ED Notes (Signed)
Daughter left dentures at bedside for pt use.

## 2022-12-29 NOTE — ED Notes (Signed)
Pt resting quietly in bed with eyes closed. Respirations even and unlabored. No S/S of distress noted.  

## 2022-12-29 NOTE — ED Notes (Signed)
Pt refused po meds x 3. Pt stated " those bp pills I have taken for years and they make me sick. I will not be taking them anymore. Nurse attempted to redirect pt and pt said " I can refuse them" Nurse told pt about the risks for HTN and pt stated " don't worry , nothing will happen to me. I am 89 yrs old'.

## 2022-12-29 NOTE — ED Notes (Signed)
Pt unable to chew her soft food diet. Diet changed to puree at this time.

## 2022-12-29 NOTE — ED Notes (Signed)
CSW spoke with pts daughter who states they continue to work on getting paperwork completed for facility placement. They are working to get pts life insurance policy to the facility. Pts daughter states that she spoke with the facility and the director of the business office is off until Monday and they will have to follow up with her then. TOC to follow.

## 2022-12-29 NOTE — ED Notes (Signed)
Tech went into pt room to assist with feeding to which pt stated she is not going to eat the food nor does she need help. RN notified of pt refusal

## 2022-12-29 NOTE — ED Provider Notes (Signed)
Emergency Medicine Observation Re-evaluation Note  Jodi Peters is a 89 y.o. female, seen on rounds today.  Pt initially presented to the ED for complaints of Weakness Currently, the patient is awaiting nursing home placement.  Physical Exam  BP (!) 175/62 (BP Location: Right Arm)   Pulse 65   Temp 98.5 F (36.9 C) (Oral)   Resp 14   Ht 5\' 4"  (1.626 m)   Wt 47.2 kg   SpO2 98%   BMI 17.85 kg/m  Physical Exam Awake and confused  ED Course / MDM  EKG:EKG Interpretation Date/Time:  Tuesday December 26 2022 16:16:35 EDT Ventricular Rate:  67 PR Interval:  157 QRS Duration:  94 QT Interval:  405 QTC Calculation: 428 R Axis:   17  Text Interpretation: Sinus rhythm Consider left ventricular hypertrophy Anterior Q waves, possibly due to LVH Similar to prior EKGs Confirmed by Vivi Barrack [phone]) on 12/26/2022 4:34:09 PM  I have reviewed the labs performed to date as well as medications administered while in observation.  Recent changes in the last 24 hours include none.  Plan  Current plan is for nursing home placement.    Bethann Berkshire, MD 12/29/22 [phone]

## 2022-12-29 NOTE — ED Notes (Signed)
Spoke with daughter. Daughter states that she was informed pt would not get placement until Monday.

## 2022-12-29 NOTE — ED Notes (Signed)
Pt refusing medications, stating, "I ain't even had time to brush my teeth or wash my face. I ain't even wearing no clothes." Pt constantly calling out, "Help!" Offered oral hygiene and bath for patient, she refused, stating, "Maybe after I call my son. I don't even know his name or how to call him!" Offered to call number listed on file. Pt declined. Pt will take bites of food when given to her, but spits out food stating, "That is horrible!" Spits out medications. Will attempt again later.

## 2022-12-29 NOTE — ED Notes (Signed)
Daughter at bedside at this time.

## 2022-12-29 NOTE — ED Notes (Signed)
Pt yelled out for help. This RN entered pts room, pt asking to be readjusted and that her covers be straightened out. Pt pulled up in the room, brief clean and dry, pts covers fixed, and lights turned down. Asked pt if she will be willing to take her night meds, she stated "No! I'm not taking any kinds of meds from anyone."

## 2022-12-30 NOTE — ED Notes (Signed)
Pt yells out asking to be assisted back to bed. Explained to pt that she is already in bed. Pt confused at this time, asking where is she and where is her family. Attempted to reassure pt multiple times.

## 2022-12-30 NOTE — ED Provider Notes (Signed)
Emergency Medicine Observation Re-evaluation Note  Jodi Peters is a 89 y.o. female, seen on rounds today.  Pt initially presented to the ED for complaints of Weakness Currently, the patient is sleeping.  Physical Exam  BP (!) 149/45   Pulse 67   Temp 98.5 F (36.9 C) (Oral)   Resp 15   Ht 5\' 4"  (1.626 m)   Wt 47.2 kg   SpO2 99%   BMI 17.85 kg/m  Physical Exam General: Sleeping Cardiac: Extremities well-perfused Lungs: Breathing is unlabored Psych: Deferred  ED Course / MDM  EKG:EKG Interpretation Date/Time:  Tuesday December 26 2022 16:16:35 EDT Ventricular Rate:  67 PR Interval:  157 QRS Duration:  94 QT Interval:  405 QTC Calculation: 428 R Axis:   17  Text Interpretation: Sinus rhythm Consider left ventricular hypertrophy Anterior Q waves, possibly due to LVH Similar to prior EKGs Confirmed by Vivi Barrack [phone]) on 12/26/2022 4:34:09 PM  I have reviewed the labs performed to date as well as medications administered while in observation.  Recent changes in the last 24 hours include none.  Plan  Current plan is for SNF placement.    Gloris Manchester, MD 12/30/22 [phone]

## 2022-12-31 NOTE — ED Notes (Signed)
Pt has been repositioned and tucked in for the night. Pt resting at this time.

## 2022-12-31 NOTE — ED Notes (Signed)
Pt repositioned, given warm blanket

## 2022-12-31 NOTE — ED Notes (Addendum)
Pt given lunch tray, set up tray for pt

## 2022-12-31 NOTE — ED Notes (Signed)
Daughter, Stanton Kidney, at bedside visiting pt

## 2022-12-31 NOTE — ED Notes (Signed)
Pt placed on bedpan, was unable to have a BM at this time. Peri care performed and pt repositioned

## 2022-12-31 NOTE — ED Provider Notes (Signed)
Emergency Medicine Observation Re-evaluation Note  ARLINE KETTER is a 89 y.o. female, seen on rounds today.  Pt initially presented to the ED for complaints of Weakness Currently, the patient is sleeping.  Physical Exam  BP (!) 132/51 (BP Location: Right Arm)   Pulse 65   Temp 98.1 F (36.7 C) (Oral)   Resp 15   Ht 5\' 4"  (1.626 m)   Wt 47.2 kg   SpO2 99%   BMI 17.85 kg/m  Physical Exam General: Sleeping Cardiac: Extremities perfused Lungs: Unlabored breathing Psych: Deferred  ED Course / MDM  EKG:EKG Interpretation Date/Time:  Tuesday December 26 2022 16:16:35 EDT Ventricular Rate:  67 PR Interval:  157 QRS Duration:  94 QT Interval:  405 QTC Calculation: 428 R Axis:   17  Text Interpretation: Sinus rhythm Consider left ventricular hypertrophy Anterior Q waves, possibly due to LVH Similar to prior EKGs Confirmed by Vivi Barrack [phone]) on 12/26/2022 4:34:09 PM  I have reviewed the labs performed to date as well as medications administered while in observation.  Recent changes in the last 24 hours include none.  Plan  Current plan is for SNF placement.    Gloris Manchester, MD 12/31/22 [phone]

## 2022-12-31 NOTE — ED Notes (Signed)
Helped pt call daughter per pts request. Pt used room telephone to speak with her daughter, Jodi Peters

## 2023-01-01 MED ORDER — BOOST / RESOURCE BREEZE PO LIQD CUSTOM
1.0000 | Freq: Three times a day (TID) | ORAL | Status: DC
  Administered 2023-01-01 – 2023-01-03 (×4): 1 via ORAL
  Filled 2023-01-01 (×16): qty 1

## 2023-01-01 NOTE — ED Notes (Signed)
Patient ate 100% of meat, 50% of broccoli and 90% of pudding

## 2023-01-01 NOTE — ED Notes (Signed)
Pt said she wasn't hungry but got her to eat some. She ate 25% of her meat, 25% of her corn, a bite of mashed potatoes and 25% of her ice cream.

## 2023-01-01 NOTE — ED Notes (Signed)
Changed pt top sheet and applied pillow under left side to aid in getting pressure off sacral area.

## 2023-01-01 NOTE — ED Notes (Signed)
Pt repositioned onto right side, propped with pillows, pillow between her knees.

## 2023-01-01 NOTE — TOC Progression Note (Signed)
Transition of Care [facility]) - Progression Note    Patient Details  Name: Jodi Peters MRN: [id number] Date of Birth: 01/22/1930  [facility] CM/SW Contact  Karn Cassis, [place] Phone Number: 01/01/2023, 3:13 PM  Clinical Narrative: LCSW spoke with Angie at [facility] again and confirmed documents were received. She will review later this afternoon and follow up with TOC in AM.            Expected Discharge Plan and Services                                               Social Determinants of Health (SDOH) Interventions SDOH Screenings   Food Insecurity: No Food Insecurity (04/11/2022)  Housing: Low Risk  (04/11/2022)  Transportation Needs: No Transportation Needs (04/11/2022)  Utilities: Not At Risk (04/11/2022)  Tobacco Use: Low Risk  (12/26/2022)    Readmission Risk Interventions     No data to display

## 2023-01-01 NOTE — TOC Progression Note (Signed)
Transition of Care [facility]) - Progression Note    Patient Details  Name: Jodi Peters MRN: [id number] Date of Birth: 1930/03/17  Transition of Care [facility]) CM/SW Contact  Karn Cassis, [place] Phone Number: 01/01/2023, 11:25 AM  Clinical Narrative:  LCSW spoke with Karoline Caldwell, business Leisure centre manager at [employer]. She reports she gave list of needed documents to pt's daughter. LCSW spoke with daughter, Stanton Kidney who said she has everything together and is making copies. She plans to take it to [facility] after lunch. Once received, Angie will review documents and let TOC know if they can make bed offer.           Expected Discharge Plan and Services                                               Social Determinants of Health (SDOH) Interventions SDOH Screenings   Food Insecurity: No Food Insecurity (04/11/2022)  Housing: Low Risk  (04/11/2022)  Transportation Needs: No Transportation Needs (04/11/2022)  Utilities: Not At Risk (04/11/2022)  Tobacco Use: Low Risk  (12/26/2022)    Readmission Risk Interventions     No data to display

## 2023-01-02 NOTE — ED Provider Notes (Signed)
Emergency Medicine Observation Re-evaluation Note  Jodi Peters is a 89 y.o. female, seen on rounds today.  Pt initially presented to the ED for complaints of general weakness. Patient has been boarding in ED awaiting SNF placement. No new c/o this AM.   Physical Exam  BP (!) 128/49   Pulse 60   Temp 98 F (36.7 C) (Oral)   Resp 18   Ht 1.626 m (5\' 4" )   Wt 47.2 kg   SpO2 100%   BMI 17.85 kg/m  Physical Exam General: resting.  Cardiac: regular rate.  Lungs: breathing comfortably.   ED Course / MDM    I have reviewed the labs performed to date as well as medications administered while in observation.  Recent changes in the last 24 hours include ED obs, reassessment.   Plan  TOC placement remains pending, dispo per [facility] team.     Cathren Laine, MD 01/02/23 [phone]

## 2023-01-02 NOTE — ED Notes (Signed)
Pt now sleeping comfortably.

## 2023-01-02 NOTE — ED Notes (Signed)
Pt changed into a new brief and new gown and given pericare. Pt changed into a new gown.

## 2023-01-02 NOTE — ED Notes (Signed)
Instructed pt to use call bell when needing assistance. Pt lacks strength to push call button at times and will yell out for help.

## 2023-01-02 NOTE — ED Notes (Addendum)
CSW confirmed with pts daughter that documents have been provided to business office at [facility]. CSW awaiting updated response from [facility] on if and when a bed will be offered for pt. TOC to follow.   Addendum 2:50pm: CSW spoke to [place] with [facility] who states they are unable to offer a bed for pt. CSW updated pts daughter of this and explained that pts referral will need to be sent out further to facilities in the county and [employer], she is understanding and agreeable. Daughter confirms Medicaid application has been completed and filed in person at this time with DSS. CSW to follow with bed offers when able. TOC to follow.

## 2023-01-03 MED ORDER — HALOPERIDOL 5 MG PO TABS
5.0000 mg | ORAL_TABLET | Freq: Three times a day (TID) | ORAL | Status: DC | PRN
  Administered 2023-01-03 (×2): 5 mg via ORAL
  Filled 2023-01-03 (×2): qty 1

## 2023-01-03 NOTE — ED Notes (Signed)
Pt chux pads, brief, gown, purewick and purewick tubing changed, pt repositioned in bed

## 2023-01-03 NOTE — ED Notes (Signed)
CSW provided pts daughter with bed offers. Pts daughter reached back out to update that family prefers [facility]. CSW spoke to [place] in admissions who states she will speak with their business office and reach out to pts family. Eunice Blase states she will provide updates to [facility] when able. TOC to follow.

## 2023-01-03 NOTE — ED Provider Notes (Addendum)
Emergency Medicine Observation Re-evaluation Note  Jodi Peters is a 89 y.o. female, Pt initially presented to the ED for complaints of Weakness Pt holding in the ED for possible snf placement.  Physical Exam  BP (!) 144/44   Pulse 65   Temp 98.7 F (37.1 C) (Oral)   Resp 16   Ht 1.626 m (5\' 4" )   Wt 47.2 kg   SpO2 93%   BMI 17.85 kg/m  Physical Exam General: resting, nad; confused about why she is here, asking to get up and get dressed, asking about where family members are Resp: normal effort   ED Course / MDM  EKG:EKG Interpretation Date/Time:  Tuesday December 26 2022 16:16:35 EDT Ventricular Rate:  67 PR Interval:  157 QRS Duration:  94 QT Interval:  405 QTC Calculation: 428 R Axis:   17  Text Interpretation: Sinus rhythm Consider left ventricular hypertrophy Anterior Q waves, possibly due to LVH Similar to prior EKGs Confirmed by Vivi Barrack [phone]) on 12/26/2022 4:34:09 PM  I have reviewed the labs performed to date as well as medications administered while in observation.  Recent changes in the last 24 hours include no acute medical events.  No accepting facility at this time for SNF.  Plan  Current plan is for SNF.      Linwood Dibbles, MD 01/03/23 [phone]

## 2023-01-04 NOTE — ED Notes (Signed)
Pt assisted in washing face and brushing teeth. Pt repositioned X3. Bed manipulated to assist in pt not sliding down. Bed pads, mepilex, and brief clean and dry.  Pt assisted in breakfast. Pt given 4 warm blankets. Pillows placed under bottom and shoulder and knees to provide comfort. Pt VSS and orientation is at baseline. Pt call bell within reach. Pt watching tv and resting. Pt has no request for needs at this time.

## 2023-01-04 NOTE — ED Notes (Signed)
Pt took sips of water, but refused to eat lunch that was provided.

## 2023-01-04 NOTE — ED Notes (Signed)
Pt discharged to [facility] with [facility]. RN attempted to call family to alert them of the DC. RN was unable to reach family. Pt and pt brief were clean and dry at time of dc. Pt was placed on fresh bed pads before DC as well. Pt dentures were left in room.  RN called facility and spoke to [facility] (sp?) who reported she would pass the message to the nurse, Nettie Elm to let the EMS know to please return to [facility] to retrieve pt belongings.

## 2023-01-04 NOTE — ED Notes (Signed)
[facility] has returned for pt dentures and has departed with them in tow.

## 2023-01-04 NOTE — ED Notes (Signed)
Son Jodi Peters was reached and informed pt has been transported to [facility]. Son was given room number and facility phone number as well. He was grateful for [facility] patience and care while his mother was here.

## 2023-01-04 NOTE — ED Notes (Signed)
CSW spoke to [place] with [facility] who states they are ready to accept pt today. CSW updated pts daughter of plan for D/C to LTC facility today. CSW updated MD and RN of plan for D/C. CSW updated RN with room and report numbers. Secretary can call for EMS when RN is ready. TOC signing off.

## 2023-01-04 NOTE — ED Notes (Signed)
Medications delayed due to therapeutic rest.

## 2023-01-04 NOTE — ED Provider Notes (Signed)
Emergency Medicine Observation Re-evaluation Note  Jodi Peters is a 89 y.o. female, seen on rounds today.  Pt initially presented to the ED for complaints of Weakness Currently, the patient is stable for placement in skilled nursing facility.  Physical Exam  BP (!) 142/52   Pulse 75   Temp 98.1 F (36.7 C) (Oral)   Resp 16   Ht 1.626 m (5\' 4" )   Wt 47.2 kg   SpO2 97%   BMI 17.85 kg/m  Physical Exam General: Resting no acute distress Cardiac: Normal heart rate blood pressure 142/52 Lungs: No respiratory distress oxygen saturations 97% with normal respiratory rate Psych: Patient is resting with no acute abnormality noted  ED Course / MDM  EKG:EKG Interpretation Date/Time:  Tuesday December 26 2022 16:16:35 EDT Ventricular Rate:  67 PR Interval:  157 QRS Duration:  94 QT Interval:  405 QTC Calculation: 428 R Axis:   17  Text Interpretation: Sinus rhythm Consider left ventricular hypertrophy Anterior Q waves, possibly due to LVH Similar to prior EKGs Confirmed by Vivi Barrack [phone]) on 12/26/2022 4:34:09 PM  I have reviewed the labs performed to date as well as medications administered while in observation.  Recent changes in the last 24 hours include patient has been accepted to a skilled nursing facility.  Plan  Current plan is for plan is for placement at skilled nursing facility.    Margarita Grizzle, MD 01/04/23 1245

## 2023-03-26 ENCOUNTER — Encounter (HOSPITAL_COMMUNITY): Payer: Self-pay | Admitting: Emergency Medicine

## 2023-03-26 ENCOUNTER — Emergency Department (HOSPITAL_COMMUNITY): Payer: Medicare Other

## 2023-03-26 ENCOUNTER — Emergency Department (HOSPITAL_COMMUNITY)
Admission: EM | Admit: 2023-03-26 | Discharge: 2023-03-27 | Disposition: A | Discharge status: Home / Self Care | Payer: Medicare Other | Attending: Emergency Medicine | Admitting: Emergency Medicine

## 2023-03-26 ENCOUNTER — Other Ambulatory Visit: Payer: Self-pay

## 2023-03-26 DIAGNOSIS — I10 Essential (primary) hypertension: Secondary | ICD-10-CM | POA: Insufficient documentation

## 2023-03-26 DIAGNOSIS — Z853 Personal history of malignant neoplasm of breast: Secondary | ICD-10-CM | POA: Diagnosis not present

## 2023-03-26 DIAGNOSIS — W19XXXA Unspecified fall, initial encounter: Secondary | ICD-10-CM | POA: Diagnosis not present

## 2023-03-26 DIAGNOSIS — M25512 Pain in left shoulder: Secondary | ICD-10-CM | POA: Insufficient documentation

## 2023-03-26 DIAGNOSIS — Z79899 Other long term (current) drug therapy: Secondary | ICD-10-CM | POA: Diagnosis not present

## 2023-03-26 DIAGNOSIS — I6782 Cerebral ischemia: Secondary | ICD-10-CM | POA: Diagnosis not present

## 2023-03-26 MED ORDER — ACETAMINOPHEN 325 MG PO TABS
650.0000 mg | ORAL_TABLET | Freq: Once | ORAL | Status: AC
  Administered 2023-03-26: 650 mg via ORAL
  Filled 2023-03-26: qty 2

## 2023-03-26 NOTE — ED Notes (Signed)
Patient transported to X-ray 

## 2023-03-26 NOTE — ED Provider Notes (Addendum)
[place] EMERGENCY DEPARTMENT AT [facility] Provider Note   CSN: [id number] Arrival date & time: 03/26/23  1855     History  Chief Complaint  Patient presents with   Hypertension   Fall    Jodi Peters is a 89 y.o. female.  Patient sent in from [facility].  Unwitnessed fall.  Patient with complaint of left shoulder pain and buttocks pain.  States she kind of went down on her butt and then her shoulder.  Denies hitting her head.  She does have a cervical collar on that was placed by EMS.  Patient apparently has been refusing to take her meds at the facility.  Patient is of sound mind and is able to make that decision.  Blood pressure 221/86 longstanding history of hypertension.  Patient without any other specific complaints.  Past medical history sniffer hyperlipidemia hypertension mitral regurg irritable bowel syndrome history of breast cancer.  Past surgical history sniffer appendectomy cholecystectomy breast lumpectomy cardiac catheterization in 2017.  Patient does not use tobacco products.  Patient's paperwork from the facility states that she is a DNR comfort care only.       Home Medications Prior to Admission medications   Medication Sig Start Date End Date Taking? Authorizing Provider  acetaminophen (TYLENOL) 500 MG tablet Take 1,000 mg by mouth See admin instructions. Take 2 tablets (1000mg  total) daily at bedtime. *May take two tablets every 8 hours as needed for pain    [provider]  amLODipine (NORVASC) 2.5 MG tablet Take 2.5 mg by mouth every evening.    [provider]  bismuth subsalicylate (PEPTO BISMOL) 262 MG/15ML suspension Take 30 mLs by mouth every 4 (four) hours as needed for diarrhea or loose stools or indigestion.    [provider]  carvedilol (COREG) 3.125 MG tablet Take 3.125 mg by mouth 2 (two) times daily with a meal.    [provider]  diclofenac Sodium (VOLTAREN) 1 % GEL Apply 2 g topically 4  (four) times daily. Patient taking differently: Apply 2 g topically in the morning and at bedtime. 04/15/22   Catarina Hartshorn, MD  docusate sodium (COLACE) 100 MG capsule Take 100 mg by mouth 2 (two) times daily.    [provider]  dorzolamide-timolol (COSOPT) 2-0.5 % ophthalmic solution Place 1 drop into both eyes 2 (two) times daily.    [provider]  escitalopram (LEXAPRO) 5 MG tablet Take 5 mg by mouth daily.    [provider]  lactose free nutrition (BOOST PLUS) LIQD Take 237 mLs by mouth 2 (two) times daily between meals.    [provider]  lisinopril (ZESTRIL) 40 MG tablet Take 1 tablet (40 mg total) by mouth daily. 04/16/22   Catarina Hartshorn, MD  meclizine (ANTIVERT) 12.5 MG tablet Take 12.5 mg by mouth 2 (two) times daily as needed for dizziness.    [provider]  Netarsudil-Latanoprost (ROCKLATAN) 0.02-0.005 % SOLN Place 1 drop into both eyes at bedtime.    [provider]  oxycodone (OXY-IR) 5 MG capsule Take 5 mg by mouth every 12 (twelve) hours.    [provider]      Allergies    Penicillins    Review of Systems   Review of Systems  Constitutional:  Negative for chills and fever.  HENT:  Negative for ear pain and sore throat.   Eyes:  Negative for pain and visual disturbance.  Respiratory:  Negative for cough and shortness of breath.  Cardiovascular:  Negative for chest pain and palpitations.  Gastrointestinal:  Negative for abdominal pain and vomiting.  Genitourinary:  Negative for dysuria and hematuria.  Musculoskeletal:  Positive for back pain. Negative for arthralgias.  Skin:  Negative for color change and rash.  Neurological:  Negative for seizures and syncope.  All other systems reviewed and are negative.   Physical Exam Updated Vital Signs BP (!) 221/86 (BP Location: Right Arm)   Pulse 64   Temp 98 F (36.7 C) (Oral)   Resp 17   Ht 1.626 m (5\' 4" )   Wt 47.2 kg   SpO2 99%   BMI 17.85 kg/m   Physical Exam Vitals and nursing note reviewed.  Constitutional:      General: She is not in acute distress.    Appearance: Normal appearance. She is well-developed.  HENT:     Head: Normocephalic and atraumatic.     Mouth/Throat:     Mouth: Mucous membranes are moist.  Eyes:     Extraocular Movements: Extraocular movements intact.     Conjunctiva/sclera: Conjunctivae normal.     Pupils: Pupils are equal, round, and reactive to light.  Cardiovascular:     Rate and Rhythm: Normal rate and regular rhythm.     Heart sounds: No murmur heard. Pulmonary:     Effort: Pulmonary effort is normal. No respiratory distress.     Breath sounds: Normal breath sounds.  Abdominal:     Palpations: Abdomen is soft.     Tenderness: There is no abdominal tenderness.  Musculoskeletal:        General: Tenderness present. No swelling.     Cervical back: Neck supple.     Comments: Tenderness to left shoulder without deformity.  Good radial pulse distally.  Good movement of fingers wrist and elbow.  Right upper extremity without evidence of any trauma lower extremities without any evidence of trauma.  Some mild tenderness to lumbar back and also tailbone area.  Patient able to raise both legs.  Skin:    General: Skin is warm and dry.     Capillary Refill: Capillary refill takes less than 2 seconds.  Neurological:     General: No focal deficit present.     Mental Status: She is alert and oriented to person, place, and time.  Psychiatric:        Mood and Affect: Mood normal.     ED Results / Procedures / Treatments   Labs (all labs ordered are listed, but only abnormal results are displayed) Labs Reviewed - No data to display  EKG EKG Interpretation Date/Time:  Monday March 26 2023 20:37:26 EDT Ventricular Rate:  72 PR Interval:  163 QRS Duration:  86 QT Interval:  413 QTC Calculation: 452 R Axis:   30  Text Interpretation: Sinus rhythm Probable anteroseptal infarct, old No significant  change since last tracing Confirmed by Vanetta Mulders [phone] on 03/26/2023 8:45:30 PM  Radiology No results found.  Procedures Procedures    Medications Ordered in ED Medications - No data to display  ED Course/ Medical Decision Making/ A&P                                 Medical Decision Making Amount and/or Complexity of Data Reviewed Radiology: ordered.  Risk OTC drugs.   Based on the patient's unwitnessed fall bleed CT head and neck.  Will also get x-ray of the lumbar spine left shoulder and  they had already ordered x-ray of the pelvis.  All x-rays and CT head and neck are still pending.   Final Clinical Impression(s) / ED Diagnoses Final diagnoses:  Fall, initial encounter  Acute pain of left shoulder    Rx / DC Orders ED Discharge Orders     None         Vanetta Mulders, MD 03/26/23 1914    Vanetta Mulders, MD 03/26/23 2314

## 2023-03-26 NOTE — ED Notes (Signed)
Patient transported to CT 

## 2023-03-26 NOTE — ED Triage Notes (Addendum)
Pt BIB [facility] from [facility] with c/o hypertension and left shoulder pain and buttock pain after an unwitnessed fall, denies hitting her head or LOC, pt refuses all PO meds at facility including prescribed thinners and HTN meds, v/s en route 230/110, HR 60, O2 97%

## 2023-03-27 NOTE — ED Notes (Signed)
Pt brief changed and provided with clean paper pants.

## 2023-03-27 NOTE — Discharge Instructions (Signed)
You were evaluated in the Emergency Department and after careful evaluation, we did not find any emergent condition requiring admission or further testing in the hospital.  Your exam/testing today is overall reassuring.  No significant injuries found CT/x-rays.  Please return to the Emergency Department if you experience any worsening of your condition.   Thank you for allowing [place] to be a part of your care.

## 2023-03-27 NOTE — ED Provider Notes (Signed)
  Provider Note MRN:  [id number]  Arrival date & time: 03/27/23    ED Course and Medical Decision Making  Assumed care from Dr. Deretha Emory at shift change.  Unwitnessed fall awaiting CT imaging, likely can be discharged back to facility if reassuring.  Procedures  Final Clinical Impressions(s) / ED Diagnoses     ICD-10-CM   1. Fall, initial encounter  W19.XXXA     2. Acute pain of left shoulder  M25.512       ED Discharge Orders     None         Discharge Instructions      You were evaluated in the Emergency Department and after careful evaluation, we did not find any emergent condition requiring admission or further testing in the hospital.  Your exam/testing today is overall reassuring.  No significant injuries found CT/x-rays.  Please return to the Emergency Department if you experience any worsening of your condition.   Thank you for allowing [place] to be a part of your care.      Elmer Sow. Pilar Plate, MD [facility] Emergency Medicine [facility] [email or web address]    Sabas Sous, MD 03/27/23 [phone]

## 2023-03-27 NOTE — ED Notes (Signed)
Called for transport back to [facility]. Sallie Staron

## 2023-05-28 ENCOUNTER — Telehealth: Payer: Self-pay

## 2023-05-28 NOTE — Telephone Encounter (Signed)
Pt daughter states she is going to call [employer] to order a new handheld for the patient monitor.

## 2023-05-30 ENCOUNTER — Ambulatory Visit (INDEPENDENT_AMBULATORY_CARE_PROVIDER_SITE_OTHER): Payer: Medicare Other

## 2023-05-30 DIAGNOSIS — I495 Sick sinus syndrome: Secondary | ICD-10-CM | POA: Diagnosis not present

## 2023-05-31 LAB — CUP PACEART REMOTE DEVICE CHECK
Battery Remaining Longevity: 35 mo
Battery Voltage: 2.96 V
Brady Statistic AP VP Percent: 0.02 %
Brady Statistic AP VS Percent: 27.99 %
Brady Statistic AS VP Percent: 0.03 %
Brady Statistic AS VS Percent: 71.95 %
Brady Statistic RA Percent Paced: 27.81 %
Brady Statistic RV Percent Paced: 0.06 %
Date Time Interrogation Session: 20241218153024
Implantable Lead Connection Status: 753985
Implantable Lead Connection Status: 753985
Implantable Lead Implant Date: 20170320
Implantable Lead Implant Date: 20170320
Implantable Lead Location: 753859
Implantable Lead Location: 753860
Implantable Lead Model: 5076
Implantable Lead Model: 5076
Implantable Pulse Generator Implant Date: 20170320
Lead Channel Impedance Value: 342 Ohm
Lead Channel Impedance Value: 342 Ohm
Lead Channel Impedance Value: 475 Ohm
Lead Channel Impedance Value: 475 Ohm
Lead Channel Pacing Threshold Amplitude: 1 V
Lead Channel Pacing Threshold Amplitude: 1.5 V
Lead Channel Pacing Threshold Pulse Width: 0.4 ms
Lead Channel Pacing Threshold Pulse Width: 0.4 ms
Lead Channel Sensing Intrinsic Amplitude: 15.5 mV
Lead Channel Sensing Intrinsic Amplitude: 15.5 mV
Lead Channel Sensing Intrinsic Amplitude: 2.375 mV
Lead Channel Sensing Intrinsic Amplitude: 2.375 mV
Lead Channel Setting Pacing Amplitude: 2 V
Lead Channel Setting Pacing Amplitude: 3 V
Lead Channel Setting Pacing Pulse Width: 0.4 ms
Lead Channel Setting Sensing Sensitivity: 2.8 mV
Zone Setting Status: 755011
Zone Setting Status: 755011

## 2023-07-09 NOTE — Addendum Note (Signed)
Addended by: Geralyn Flash D on: 07/09/2023 03:57 PM   Modules accepted: Orders

## 2023-07-09 NOTE — Progress Notes (Signed)
Remote pacemaker transmission.

## 2023-08-29 ENCOUNTER — Ambulatory Visit (INDEPENDENT_AMBULATORY_CARE_PROVIDER_SITE_OTHER): Payer: Medicare Other

## 2023-08-29 DIAGNOSIS — I495 Sick sinus syndrome: Secondary | ICD-10-CM | POA: Diagnosis not present

## 2023-08-30 LAB — CUP PACEART REMOTE DEVICE CHECK
Battery Remaining Longevity: 35 mo
Battery Voltage: 2.95 V
Brady Statistic AP VP Percent: 0.05 %
Brady Statistic AP VS Percent: 63.38 %
Brady Statistic AS VP Percent: 0.02 %
Brady Statistic AS VS Percent: 36.54 %
Brady Statistic RA Percent Paced: 63.38 %
Brady Statistic RV Percent Paced: 0.08 %
Date Time Interrogation Session: 20250320111010
Implantable Lead Connection Status: 753985
Implantable Lead Connection Status: 753985
Implantable Lead Implant Date: 20170320
Implantable Lead Implant Date: 20170320
Implantable Lead Location: 753859
Implantable Lead Location: 753860
Implantable Lead Model: 5076
Implantable Lead Model: 5076
Implantable Pulse Generator Implant Date: 20170320
Lead Channel Impedance Value: 304 Ohm
Lead Channel Impedance Value: 342 Ohm
Lead Channel Impedance Value: 437 Ohm
Lead Channel Impedance Value: 475 Ohm
Lead Channel Pacing Threshold Amplitude: 0.875 V
Lead Channel Pacing Threshold Amplitude: 1.5 V
Lead Channel Pacing Threshold Pulse Width: 0.4 ms
Lead Channel Pacing Threshold Pulse Width: 0.4 ms
Lead Channel Sensing Intrinsic Amplitude: 1.625 mV
Lead Channel Sensing Intrinsic Amplitude: 1.625 mV
Lead Channel Sensing Intrinsic Amplitude: 16.75 mV
Lead Channel Sensing Intrinsic Amplitude: 16.75 mV
Lead Channel Setting Pacing Amplitude: 2 V
Lead Channel Setting Pacing Amplitude: 3 V
Lead Channel Setting Pacing Pulse Width: 0.4 ms
Lead Channel Setting Sensing Sensitivity: 2.8 mV
Zone Setting Status: 755011
Zone Setting Status: 755011

## 2023-09-06 ENCOUNTER — Encounter (INDEPENDENT_AMBULATORY_CARE_PROVIDER_SITE_OTHER): Admitting: Ophthalmology

## 2023-09-06 DIAGNOSIS — H43813 Vitreous degeneration, bilateral: Secondary | ICD-10-CM | POA: Diagnosis not present

## 2023-09-06 DIAGNOSIS — H34811 Central retinal vein occlusion, right eye, with macular edema: Secondary | ICD-10-CM

## 2023-09-06 DIAGNOSIS — I10 Essential (primary) hypertension: Secondary | ICD-10-CM

## 2023-09-06 DIAGNOSIS — H35033 Hypertensive retinopathy, bilateral: Secondary | ICD-10-CM | POA: Diagnosis not present

## 2023-10-12 NOTE — Addendum Note (Signed)
 Addended by: Lott Rouleau A on: 10/12/2023 01:16 PM   Modules accepted: Orders

## 2023-10-12 NOTE — Progress Notes (Signed)
 Remote pacemaker transmission.

## 2023-11-28 ENCOUNTER — Ambulatory Visit (INDEPENDENT_AMBULATORY_CARE_PROVIDER_SITE_OTHER): Payer: Medicare Other

## 2023-11-28 DIAGNOSIS — I495 Sick sinus syndrome: Secondary | ICD-10-CM

## 2023-11-29 ENCOUNTER — Ambulatory Visit: Payer: Self-pay | Admitting: Internal Medicine

## 2023-11-29 LAB — CUP PACEART REMOTE DEVICE CHECK
Battery Remaining Longevity: 30 mo
Battery Voltage: 2.95 V
Brady Statistic AP VP Percent: 0.06 %
Brady Statistic AP VS Percent: 63.1 %
Brady Statistic AS VP Percent: 0.02 %
Brady Statistic AS VS Percent: 36.82 %
Brady Statistic RA Percent Paced: 63.1 %
Brady Statistic RV Percent Paced: 0.09 %
Date Time Interrogation Session: 20250617132751
Implantable Lead Connection Status: 753985
Implantable Lead Connection Status: 753985
Implantable Lead Implant Date: 20170320
Implantable Lead Implant Date: 20170320
Implantable Lead Location: 753859
Implantable Lead Location: 753860
Implantable Lead Model: 5076
Implantable Lead Model: 5076
Implantable Pulse Generator Implant Date: 20170320
Lead Channel Impedance Value: 361 Ohm
Lead Channel Impedance Value: 361 Ohm
Lead Channel Impedance Value: 475 Ohm
Lead Channel Impedance Value: 494 Ohm
Lead Channel Pacing Threshold Amplitude: 0.875 V
Lead Channel Pacing Threshold Amplitude: 1.5 V
Lead Channel Pacing Threshold Pulse Width: 0.4 ms
Lead Channel Pacing Threshold Pulse Width: 0.4 ms
Lead Channel Sensing Intrinsic Amplitude: 18.75 mV
Lead Channel Sensing Intrinsic Amplitude: 18.75 mV
Lead Channel Sensing Intrinsic Amplitude: 2.375 mV
Lead Channel Sensing Intrinsic Amplitude: 2.375 mV
Lead Channel Setting Pacing Amplitude: 1.75 V
Lead Channel Setting Pacing Amplitude: 3 V
Lead Channel Setting Pacing Pulse Width: 0.4 ms
Lead Channel Setting Sensing Sensitivity: 2.8 mV
Zone Setting Status: 755011
Zone Setting Status: 755011

## 2024-02-07 NOTE — Progress Notes (Signed)
 Remote pacemaker transmission.

## 2024-02-07 NOTE — Addendum Note (Signed)
 Addended by: VICCI SELLER A on: 02/07/2024 08:26 AM   Modules accepted: Orders

## 2024-02-27 ENCOUNTER — Ambulatory Visit (INDEPENDENT_AMBULATORY_CARE_PROVIDER_SITE_OTHER): Payer: Medicare Other

## 2024-02-27 DIAGNOSIS — I495 Sick sinus syndrome: Secondary | ICD-10-CM

## 2024-02-29 LAB — CUP PACEART REMOTE DEVICE CHECK
Battery Remaining Longevity: 28 mo
Battery Voltage: 2.94 V
Brady Statistic AP VP Percent: 0.05 %
Brady Statistic AP VS Percent: 43.71 %
Brady Statistic AS VP Percent: 0.02 %
Brady Statistic AS VS Percent: 56.22 %
Brady Statistic RA Percent Paced: 43.71 %
Brady Statistic RV Percent Paced: 0.07 %
Date Time Interrogation Session: 20250918140324
Implantable Lead Connection Status: 753985
Implantable Lead Connection Status: 753985
Implantable Lead Implant Date: 20170320
Implantable Lead Implant Date: 20170320
Implantable Lead Location: 753859
Implantable Lead Location: 753860
Implantable Lead Model: 5076
Implantable Lead Model: 5076
Implantable Pulse Generator Implant Date: 20170320
Lead Channel Impedance Value: 323 Ohm
Lead Channel Impedance Value: 323 Ohm
Lead Channel Impedance Value: 456 Ohm
Lead Channel Impedance Value: 456 Ohm
Lead Channel Pacing Threshold Amplitude: 0.875 V
Lead Channel Pacing Threshold Amplitude: 1.5 V
Lead Channel Pacing Threshold Pulse Width: 0.4 ms
Lead Channel Pacing Threshold Pulse Width: 0.4 ms
Lead Channel Sensing Intrinsic Amplitude: 17.5 mV
Lead Channel Sensing Intrinsic Amplitude: 17.5 mV
Lead Channel Sensing Intrinsic Amplitude: 2 mV
Lead Channel Sensing Intrinsic Amplitude: 2 mV
Lead Channel Setting Pacing Amplitude: 2 V
Lead Channel Setting Pacing Amplitude: 3 V
Lead Channel Setting Pacing Pulse Width: 0.4 ms
Lead Channel Setting Sensing Sensitivity: 2.8 mV
Zone Setting Status: 755011
Zone Setting Status: 755011

## 2024-03-04 NOTE — Progress Notes (Signed)
 Remote PPM Transmission

## 2024-03-08 ENCOUNTER — Ambulatory Visit: Payer: Self-pay | Admitting: Internal Medicine

## 2024-05-28 ENCOUNTER — Ambulatory Visit: Payer: Medicare Other

## 2024-05-28 DIAGNOSIS — I495 Sick sinus syndrome: Secondary | ICD-10-CM

## 2024-05-29 LAB — CUP PACEART REMOTE DEVICE CHECK
Battery Remaining Longevity: 25 mo
Battery Voltage: 2.93 V
Brady Statistic AP VP Percent: 0.04 %
Brady Statistic AP VS Percent: 67.17 %
Brady Statistic AS VP Percent: 0.02 %
Brady Statistic AS VS Percent: 32.78 %
Brady Statistic RA Percent Paced: 66.72 %
Brady Statistic RV Percent Paced: 0.05 %
Date Time Interrogation Session: 20251218150031
Implantable Lead Connection Status: 753985
Implantable Lead Connection Status: 753985
Implantable Lead Implant Date: 20170320
Implantable Lead Implant Date: 20170320
Implantable Lead Location: 753859
Implantable Lead Location: 753860
Implantable Lead Model: 5076
Implantable Lead Model: 5076
Implantable Pulse Generator Implant Date: 20170320
Lead Channel Impedance Value: 285 Ohm
Lead Channel Impedance Value: 342 Ohm
Lead Channel Impedance Value: 399 Ohm
Lead Channel Impedance Value: 475 Ohm
Lead Channel Pacing Threshold Amplitude: 1.125 V
Lead Channel Pacing Threshold Amplitude: 1.25 V
Lead Channel Pacing Threshold Pulse Width: 0.4 ms
Lead Channel Pacing Threshold Pulse Width: 0.4 ms
Lead Channel Sensing Intrinsic Amplitude: 1.375 mV
Lead Channel Sensing Intrinsic Amplitude: 1.375 mV
Lead Channel Sensing Intrinsic Amplitude: 13.75 mV
Lead Channel Sensing Intrinsic Amplitude: 13.75 mV
Lead Channel Setting Pacing Amplitude: 2.25 V
Lead Channel Setting Pacing Amplitude: 2.5 V
Lead Channel Setting Pacing Pulse Width: 0.4 ms
Lead Channel Setting Sensing Sensitivity: 2.8 mV
Zone Setting Status: 755011
Zone Setting Status: 755011

## 2024-05-30 NOTE — Progress Notes (Signed)
 Remote PPM Transmission

## 2024-06-06 ENCOUNTER — Ambulatory Visit: Payer: Self-pay | Admitting: Student in an Organized Health Care Education/Training Program

## 2024-07-11 ENCOUNTER — Emergency Department (HOSPITAL_COMMUNITY)

## 2024-07-11 ENCOUNTER — Encounter (HOSPITAL_COMMUNITY): Payer: Self-pay | Admitting: Emergency Medicine

## 2024-07-11 ENCOUNTER — Emergency Department (HOSPITAL_COMMUNITY)
Admission: EM | Admit: 2024-07-11 | Discharge: 2024-07-12 | Disposition: A | Source: Skilled Nursing Facility | Attending: Emergency Medicine | Admitting: Emergency Medicine

## 2024-07-11 ENCOUNTER — Other Ambulatory Visit: Payer: Self-pay

## 2024-07-11 DIAGNOSIS — R011 Cardiac murmur, unspecified: Secondary | ICD-10-CM | POA: Insufficient documentation

## 2024-07-11 DIAGNOSIS — Z79899 Other long term (current) drug therapy: Secondary | ICD-10-CM | POA: Insufficient documentation

## 2024-07-11 DIAGNOSIS — R55 Syncope and collapse: Secondary | ICD-10-CM | POA: Insufficient documentation

## 2024-07-11 DIAGNOSIS — J101 Influenza due to other identified influenza virus with other respiratory manifestations: Secondary | ICD-10-CM | POA: Diagnosis not present

## 2024-07-11 DIAGNOSIS — Z853 Personal history of malignant neoplasm of breast: Secondary | ICD-10-CM | POA: Diagnosis not present

## 2024-07-11 DIAGNOSIS — I129 Hypertensive chronic kidney disease with stage 1 through stage 4 chronic kidney disease, or unspecified chronic kidney disease: Secondary | ICD-10-CM | POA: Diagnosis not present

## 2024-07-11 DIAGNOSIS — N189 Chronic kidney disease, unspecified: Secondary | ICD-10-CM | POA: Diagnosis not present

## 2024-07-11 LAB — CBC WITH DIFFERENTIAL/PLATELET
Abs Immature Granulocytes: 0.02 10*3/uL (ref 0.00–0.07)
Basophils Absolute: 0 10*3/uL (ref 0.0–0.1)
Basophils Relative: 1 %
Eosinophils Absolute: 0.1 10*3/uL (ref 0.0–0.5)
Eosinophils Relative: 1 %
HCT: 36.5 % (ref 36.0–46.0)
Hemoglobin: 10.8 g/dL — ABNORMAL LOW (ref 12.0–15.0)
Immature Granulocytes: 0 %
Lymphocytes Relative: 33 %
Lymphs Abs: 2 10*3/uL (ref 0.7–4.0)
MCH: 28.4 pg (ref 26.0–34.0)
MCHC: 29.6 g/dL — ABNORMAL LOW (ref 30.0–36.0)
MCV: 96.1 fL (ref 80.0–100.0)
Monocytes Absolute: 0.9 10*3/uL (ref 0.1–1.0)
Monocytes Relative: 16 %
Neutro Abs: 3 10*3/uL (ref 1.7–7.7)
Neutrophils Relative %: 49 %
Platelets: 180 10*3/uL (ref 150–400)
RBC: 3.8 MIL/uL — ABNORMAL LOW (ref 3.87–5.11)
RDW: 15.2 % (ref 11.5–15.5)
WBC: 6 10*3/uL (ref 4.0–10.5)
nRBC: 0 % (ref 0.0–0.2)

## 2024-07-11 LAB — HEPATIC FUNCTION PANEL
ALT: <5 U/L (ref 0–44)
AST: 21 U/L (ref 15–41)
Albumin: 3.7 g/dL (ref 3.5–5.0)
Alkaline Phosphatase: 90 U/L (ref 38–126)
Bilirubin, Direct: 0.2 mg/dL (ref 0.0–0.2)
Indirect Bilirubin: 0.2 mg/dL — ABNORMAL LOW (ref 0.3–0.9)
Total Bilirubin: 0.4 mg/dL (ref 0.0–1.2)
Total Protein: 6.7 g/dL (ref 6.5–8.1)

## 2024-07-11 LAB — RESP PANEL BY RT-PCR (RSV, FLU A&B, COVID)  RVPGX2
Influenza A by PCR: POSITIVE — AB
Influenza B by PCR: NEGATIVE
Resp Syncytial Virus by PCR: NEGATIVE
SARS Coronavirus 2 by RT PCR: NEGATIVE

## 2024-07-11 LAB — BASIC METABOLIC PANEL WITH GFR
Anion gap: 15 (ref 5–15)
BUN: 33 mg/dL — ABNORMAL HIGH (ref 8–23)
CO2: 21 mmol/L — ABNORMAL LOW (ref 22–32)
Calcium: 9.3 mg/dL (ref 8.9–10.3)
Chloride: 106 mmol/L (ref 98–111)
Creatinine, Ser: 1.61 mg/dL — ABNORMAL HIGH (ref 0.44–1.00)
GFR, Estimated: 30 mL/min — ABNORMAL LOW
Glucose, Bld: 95 mg/dL (ref 70–99)
Potassium: 4 mmol/L (ref 3.5–5.1)
Sodium: 143 mmol/L (ref 135–145)

## 2024-07-11 LAB — TROPONIN T, HIGH SENSITIVITY
Troponin T High Sensitivity: 61 ng/L — ABNORMAL HIGH (ref 0–19)
Troponin T High Sensitivity: 58 ng/L — ABNORMAL HIGH (ref 0–19)

## 2024-07-11 LAB — CK: Total CK: 52 U/L (ref 38–234)

## 2024-07-11 MED ORDER — OSELTAMIVIR PHOSPHATE 30 MG PO CAPS: 30.0000 mg | ORAL_CAPSULE | Freq: Every day | ORAL | 0 refills | Status: AC

## 2024-07-11 MED ORDER — SODIUM CHLORIDE 0.9 % IV BOLUS
500.0000 mL | Freq: Once | INTRAVENOUS | Status: AC
  Administered 2024-07-11: 500 mL via INTRAVENOUS

## 2024-07-11 NOTE — ED Notes (Addendum)
 Per provider gray, pt does not need straight cath/ua at this time.

## 2024-07-11 NOTE — ED Notes (Signed)
 Report given to [facility] @ [facility]

## 2024-07-11 NOTE — Discharge Instructions (Signed)
 It was a pleasure caring for you today in the emergency department.  Be sure to drink plenty of fluids over the next few days, get plenty of rest.  You are positive today for the flu.  Have someone stay with you until you feel stable. Do not drive, operate machinery, or play sports until your caregiver says it is okay. Keep all follow-up appointments as directed by your caregiver. Lie down right away if you start feeling like you might faint. Breathe deeply and steadily. Wait until all the symptoms have passed.Drink enough fluids to keep your urine clear or pale yellow. If you are taking blood pressure or heart medicine, get up slowly, taking several minutes to sit and then stand. This can reduce dizziness. SEEK IMMEDIATE MEDICAL CARE IF: You have a severe headache. You have unusual pain in the chest, abdomen, or back. You are bleeding from the mouth or rectum, or you have a black or tarry stool. You have an irregular or very fast heartbeat. You have pain with breathing. You have repeated fainting or seizure-like jerking during an episode. You faint when sitting or lying down. You have confusion. You have difficulty walking. You have severe weakness. You have vision problems. If you fainted, call your local emergency services - do not drive yourself to the hospital.   Please return to the emergency department immediately for any new or concerning symptoms, or if you get worse.

## 2024-07-12 NOTE — ED Notes (Signed)
 Report given to EMS, pt to be transported back to facility
